# Patient Record
Sex: Male | Born: 1955
Health system: Southern US, Community
[De-identification: ages and names within clinical notes are randomized; demographics above are authoritative.]

## PROBLEM LIST (undated history)

## (undated) DIAGNOSIS — J189 Pneumonia, unspecified organism: Secondary | ICD-10-CM

## (undated) DIAGNOSIS — I219 Acute myocardial infarction, unspecified: Secondary | ICD-10-CM

## (undated) DIAGNOSIS — I099 Rheumatic heart disease, unspecified: Secondary | ICD-10-CM

## (undated) DIAGNOSIS — I080 Rheumatic disorders of both mitral and aortic valves: Secondary | ICD-10-CM

## (undated) DIAGNOSIS — I499 Cardiac arrhythmia, unspecified: Secondary | ICD-10-CM

## (undated) DIAGNOSIS — Z8719 Personal history of other diseases of the digestive system: Secondary | ICD-10-CM

## (undated) DIAGNOSIS — I639 Cerebral infarction, unspecified: Secondary | ICD-10-CM

## (undated) DIAGNOSIS — J449 Chronic obstructive pulmonary disease, unspecified: Secondary | ICD-10-CM

## (undated) DIAGNOSIS — I209 Angina pectoris, unspecified: Secondary | ICD-10-CM

## (undated) DIAGNOSIS — Z952 Presence of prosthetic heart valve: Secondary | ICD-10-CM

## (undated) DIAGNOSIS — I10 Essential (primary) hypertension: Secondary | ICD-10-CM

## (undated) DIAGNOSIS — C801 Malignant (primary) neoplasm, unspecified: Secondary | ICD-10-CM

## (undated) DIAGNOSIS — I739 Peripheral vascular disease, unspecified: Secondary | ICD-10-CM

## (undated) DIAGNOSIS — R0600 Dyspnea, unspecified: Secondary | ICD-10-CM

## (undated) DIAGNOSIS — I2510 Atherosclerotic heart disease of native coronary artery without angina pectoris: Secondary | ICD-10-CM

## (undated) HISTORY — DX: Presence of prosthetic heart valve: Z95.2

## (undated) HISTORY — DX: Peripheral vascular disease, unspecified: I73.9

## (undated) HISTORY — PX: CARDIAC VALVE REPLACEMENT: SHX585

## (undated) HISTORY — PX: CARDIAC SURGERY: SHX584

## (undated) HISTORY — PX: APPENDECTOMY: SHX54

## (undated) HISTORY — DX: Chronic obstructive pulmonary disease, unspecified: J44.9

---

## 2011-04-02 HISTORY — PX: MAZE: SHX5063

## 2011-04-02 HISTORY — PX: AORTIC AND MITRAL VALVE REPLACEMENT: SHX5056

## 2017-02-10 DIAGNOSIS — Z5181 Encounter for therapeutic drug level monitoring: Secondary | ICD-10-CM | POA: Insufficient documentation

## 2017-02-10 DIAGNOSIS — Z7901 Long term (current) use of anticoagulants: Secondary | ICD-10-CM | POA: Insufficient documentation

## 2017-10-30 HISTORY — PX: CORONARY ANGIOPLASTY WITH STENT PLACEMENT: SHX49

## 2017-12-01 ENCOUNTER — Telehealth (HOSPITAL_COMMUNITY): Payer: Self-pay | Admitting: *Deleted

## 2017-12-01 NOTE — Telephone Encounter (Signed)
Cardiac Rehab referral received from Dr. Edd Arbour at [place] had STEMI and DES RCA at [facility] on 11/21/17.  Received discharge summary notes, review completed. Additional information is needed prior to scheduling unable to access records from [facility].  Will have support staff send MD order and request for medical records.  Noted on discharge summary pt to see Dr. Daiva Huge on 11/12.  Pt will be advised to contact cardiologist office for earlier appt. Pt will need to complete follow up prior to scheduling. Cherre Huger, BSN Cardiac and Training and development officer

## 2017-12-02 ENCOUNTER — Telehealth (HOSPITAL_COMMUNITY): Payer: Self-pay

## 2017-12-02 NOTE — Telephone Encounter (Signed)
Called and spoke with patient in regards to Cardiac Rehab - patient stated he is not interested in the program. He is currently walking on his own and feels good. Closed referral.

## 2017-12-02 NOTE — Telephone Encounter (Signed)
Paper/Outside referral received from [facility] over Physician Order for Dr.Tan to fill, sign, and complete. Requested recent 12Lead/Ekg and also recent office notes if any. Patient has scheduled appt with Dr.Ghandi on 11/12. Will need sooner appt. Will contact patient in regards to insurance and seeing if he can schedule an appt sooner than 11/12.

## 2018-02-03 ENCOUNTER — Emergency Department (HOSPITAL_COMMUNITY)
Admission: EM | Admit: 2018-02-03 | Discharge: 2018-02-03 | Disposition: A | Discharge status: Home / Self Care | Payer: 59 | Attending: Emergency Medicine | Admitting: Emergency Medicine

## 2018-02-03 ENCOUNTER — Encounter (HOSPITAL_COMMUNITY): Payer: Self-pay | Admitting: Emergency Medicine

## 2018-02-03 DIAGNOSIS — I2510 Atherosclerotic heart disease of native coronary artery without angina pectoris: Secondary | ICD-10-CM | POA: Insufficient documentation

## 2018-02-03 DIAGNOSIS — Z79899 Other long term (current) drug therapy: Secondary | ICD-10-CM | POA: Diagnosis not present

## 2018-02-03 DIAGNOSIS — M25552 Pain in left hip: Secondary | ICD-10-CM | POA: Diagnosis not present

## 2018-02-03 DIAGNOSIS — Z951 Presence of aortocoronary bypass graft: Secondary | ICD-10-CM | POA: Diagnosis not present

## 2018-02-03 DIAGNOSIS — Z7901 Long term (current) use of anticoagulants: Secondary | ICD-10-CM | POA: Diagnosis not present

## 2018-02-03 DIAGNOSIS — Z7902 Long term (current) use of antithrombotics/antiplatelets: Secondary | ICD-10-CM | POA: Diagnosis not present

## 2018-02-03 DIAGNOSIS — R1032 Left lower quadrant pain: Secondary | ICD-10-CM | POA: Diagnosis present

## 2018-02-03 HISTORY — DX: Atherosclerotic heart disease of native coronary artery without angina pectoris: I25.10

## 2018-02-03 LAB — COMPREHENSIVE METABOLIC PANEL
ALK PHOS: 91 U/L (ref 38–126)
ALT: 28 U/L (ref 0–44)
AST: 26 U/L (ref 15–41)
Albumin: 4 g/dL (ref 3.5–5.0)
Anion gap: 9 (ref 5–15)
BUN: 9 mg/dL (ref 8–23)
CALCIUM: 9.4 mg/dL (ref 8.9–10.3)
CO2: 26 mmol/L (ref 22–32)
Chloride: 104 mmol/L (ref 98–111)
Creatinine, Ser: 0.92 mg/dL (ref 0.61–1.24)
GFR calc non Af Amer: >60 mL/min (ref >60)
Glucose, Bld: 118 mg/dL — ABNORMAL HIGH (ref 70–99)
POTASSIUM: 3.8 mmol/L (ref 3.5–5.1)
SODIUM: 139 mmol/L (ref 135–145)
Total Bilirubin: 1 mg/dL (ref 0.3–1.2)
Total Protein: 6.8 g/dL (ref 6.5–8.1)

## 2018-02-03 LAB — URINALYSIS, ROUTINE W REFLEX MICROSCOPIC
Bilirubin Urine: NEGATIVE
GLUCOSE, UA: NEGATIVE mg/dL
KETONES UR: 5 mg/dL — AB
Leukocytes, UA: NEGATIVE
Nitrite: NEGATIVE
PH: 5 (ref 5.0–8.0)
Protein, ur: NEGATIVE mg/dL
SPECIFIC GRAVITY, URINE: 1.026 (ref 1.005–1.030)

## 2018-02-03 LAB — CBC
HCT: 48.4 % (ref 39.0–52.0)
Hemoglobin: 15.1 g/dL (ref 13.0–17.0)
MCH: 30 pg (ref 26.0–34.0)
MCHC: 31.2 g/dL (ref 30.0–36.0)
MCV: 96.2 fL (ref 80.0–100.0)
NRBC: 0 % (ref 0.0–0.2)
PLATELETS: 183 10*3/uL (ref 150–400)
RBC: 5.03 MIL/uL (ref 4.22–5.81)
RDW: 13.1 % (ref 11.5–15.5)
WBC: 6.9 10*3/uL (ref 4.0–10.5)

## 2018-02-03 LAB — LIPASE, BLOOD: Lipase: 39 U/L (ref 11–51)

## 2018-02-03 MED ORDER — NAPROXEN 375 MG PO TABS: 375.0000 mg | ORAL_TABLET | Freq: Two times a day (BID) | ORAL | 0 refills | Status: DC

## 2018-02-03 NOTE — ED Triage Notes (Signed)
Pt arrives to ed with c/o of left side pain into abd and back. Denies any urinary symptoms, no n/v/d.

## 2018-02-03 NOTE — ED Notes (Signed)
ED Provider at bedside. 

## 2018-02-03 NOTE — ED Notes (Signed)
Patient verbalizes understanding of discharge instructions. Opportunity for questioning and answers were provided. Armband removed by staff, pt discharged from ED. Pt wheeled to lobby with family.  

## 2018-02-03 NOTE — ED Provider Notes (Signed)
[facility] Provider Note   CSN: [id number] Arrival date & time: 02/03/18  1129     History   Chief Complaint Chief Complaint  Patient presents with  . Flank Pain    HPI Jawon Dipiero is a 62 y.o. male with a hx of CAD with prior CABG 12 years prior and subsequent stent placement 2 months prior who presents to the ED with complaints of L groin pain for the past 3 days. Patient points to the area of the anterior hip/L groin region. He states it does not radiate into the testicle, upper abdomen, or down the leg. Patient states pain is specific to AROM to the L hip, ambulation, coughing, and certain positions. He states that it is alleviated and fairly much resolves in certain positions. No intervention tried PTA. He cannot recall a specific injury or change in activity. Recent PCI with catheter placement in UE not in the groin. Denies fever, chills, nausea, vomiting, diarrhea, blood in stool, numbness, weakness, dysuria, hematuria, frequency, testicular pain/swelling, or penile discharge.   HPI  Past Medical History:  Diagnosis Date  . Coronary artery disease     There are no active problems to display for this patient.   Past Surgical History:  Procedure Laterality Date  . CARDIAC SURGERY          Home Medications    Prior to Admission medications   Medication Sig Start Date End Date Taking? Authorizing Provider  atorvastatin (LIPITOR) 80 MG tablet Take 80 mg by mouth daily.   Yes [provider]  carvedilol (COREG) 3.125 MG tablet Take 3.125 mg by mouth 2 (two) times daily with a meal.   Yes [provider]  clopidogrel (PLAVIX) 75 MG tablet Take 75 mg by mouth daily.   Yes [provider]  enalapril (VASOTEC) 5 MG tablet Take 5 mg by mouth daily.   Yes [provider]  warfarin (COUMADIN) 5 MG tablet Take 5 mg by mouth daily. sunday   Yes [provider]  warfarin (COUMADIN) 7.5 MG  tablet Take 7.5 mg by mouth daily.   Yes [provider]    Family History History reviewed. No pertinent family history.  Social History Social History   Tobacco Use  . Smoking status: Not on file  Substance Use Topics  . Alcohol use: Not on file  . Drug use: Not on file     Allergies   Patient has no known allergies.   Review of Systems Review of Systems  Constitutional: Negative for chills and fever.  Respiratory: Negative for shortness of breath.   Cardiovascular: Negative for chest pain.  Gastrointestinal: Negative for nausea and vomiting.  Genitourinary: Negative for discharge, dysuria, frequency, hematuria, penile pain, penile swelling, scrotal swelling, testicular pain and urgency.       Positive for L groin pain/hip pain.   Neurological: Negative for weakness and numbness.  All other systems reviewed and are negative.    Physical Exam Updated Vital Signs BP 138/67   Pulse 75   Temp 97.9 F (36.6 C) (Oral)   Resp 18   SpO2 98%   Physical Exam  Constitutional: He appears well-developed and well-nourished.  Non-toxic appearance. No distress.  HENT:  Head: Normocephalic and atraumatic.  Eyes: Conjunctivae are normal. Right eye exhibits no discharge. Left eye exhibits no discharge.  Neck: Neck supple.  Cardiovascular: Normal rate and regular rhythm.  Pulses:      Dorsalis pedis pulses are 2+ on  the right side, and 2+ on the left side.       Posterior tibial pulses are 2+ on the right side, and 2+ on the left side.  Pulmonary/Chest: Effort normal and breath sounds normal. No respiratory distress. He has no wheezes. He has no rhonchi. He has no rales.  Respiration even and unlabored  Abdominal: Soft. He exhibits no distension. There is no tenderness. There is no rigidity, no rebound, no guarding, no CVA tenderness and no tenderness at McBurney's point. No hernia. Hernia confirmed negative in the right inguinal area and confirmed negative in the left  inguinal area.  Genitourinary: Testes normal and penis normal. Cremasteric reflex is present. Right testis shows no mass and no tenderness. Left testis shows no mass and no tenderness.  Genitourinary Comments: EDT Allena Katz present as chaperone.   Musculoskeletal:  No obvious deformity, appreciable swelling, erythema, ecchymosis, warmth, or open wounds. Patient has venous stasis changes to the LEs bilaterally. Patient has AROM intact to bilateral knees, ankles, and all digits. He has full painless AROM to the R hip. AROM intact to the L hip however patient has pain with active flexion, abduction, and extension of the L hip. No pain with passive ROM to the L hip joint. He has some tenderness to the lateral hip without point/focal bony tenderness. NVI distally.   Neurological: He is alert.  Clear speech.  Sensation grossly intact bilateral lower extremities.  5 out of 5 strength plantar dorsiflexion bilaterally.  Ambulatory with antalgic gait.  Skin: Skin is warm and dry. No rash noted.  Psychiatric: He has a normal mood and affect. His behavior is normal.  Nursing note and vitals reviewed.    ED Treatments / Results  Labs (all labs ordered are listed, but only abnormal results are displayed) Labs Reviewed  COMPREHENSIVE METABOLIC PANEL - Abnormal; Notable for the following components:      Result Value   Glucose, Bld 118 (*)    All other components within normal limits  URINALYSIS, ROUTINE W REFLEX MICROSCOPIC - Abnormal; Notable for the following components:   Hgb urine dipstick SMALL (*)    Ketones, ur 5 (*)    Bacteria, UA RARE (*)    All other components within normal limits  LIPASE, BLOOD  CBC    EKG None  Radiology No results found.  Procedures Procedures (including critical care time)  Medications Ordered in ED Medications - No data to display   Initial Impression / Assessment and Plan / ED Course  I have reviewed the triage vital signs and the nursing  notes.  Pertinent labs & imaging results that were available during my care of the patient were reviewed by me and considered in my medical decision making (see chart for details).   Patient presents to the ED with L groin pain. Patient nontoxic appearing, in no apparent distress, vitals WNL with the exception of initially elevated BP- doubt HTN emergency. Lab work per triage prior to my assessment reviewed fairly unremarkable. No leukocytosis, anemia, or significant electrolyte disturbance. LFTs, lipase, and renal function WNL. Urinalysis with small amount of hematuria.    On exam patient's abdomen is soft and nontender without peritoneal signs- doubt diverticulitis or bowel obstruction/perforation. No palpable hernia to the inguinal or femoral areas on exam. Given distribution of sxs feel that nephrolithiasis is less likely, hematuria will require PCP recheck. No evidence of UTI/pyelo. No fevers or overlying erythema/warmth of the joint, passive ROM without pain, do not suspect septic joint. Suspect musculoskeletal  in nature given discomfort with AROM and with lateral hip palpation. No injuries to raise concern for fracture/dislocation. NVI distally.  Findings and plan of care discussed with supervising physician Dr. Kathrynn Humble who personally evaluated and examined patient-recommends short term prescription for naproxen 375 mg twice daily with PRICE and PCP follow up, I am in agreement. Crutches were offered- patient declined. I discussed results, treatment plan, need for PCP follow-up, and return precautions with the patient and his wife at bedside. Provided opportunity for questions, patient and his wife confirmed understanding and are in agreement with plan.     Final Clinical Impressions(s) / ED Diagnoses   Final diagnoses:  Pain of left hip joint    ED Discharge Orders         Ordered    naproxen (NAPROSYN) 375 MG tablet  2 times daily     02/03/18 1948           Petrucelli, Glynda Jaeger,  PA-C 02/03/18 2036    Varney Biles, MD 02/04/18 1905

## 2018-02-03 NOTE — Discharge Instructions (Addendum)
You were seen in the emergency department today for left groin pain.  Your overall work-up in the ER was reassuring.  Your labs were normal with the exception of some blood in your urine which needs to be rechecked by her primary care provider.  Your blood sugar was also mildly elevated which can also be rechecked by her primary care provider.  We suspect your pain is musculoskeletal in nature and are sending you home with a prescription for naproxen with instructions to apply ice wrapped in a towel 20 minutes on 40 minutes off for the next 48 hours.  - Naproxen is a nonsteroidal anti-inflammatory medication that will help with pain and swelling. Be sure to take this medication as prescribed with food, 1 pill every 12 hours,  It should be taken with food, as it can cause stomach upset, and more seriously, stomach bleeding. Do not take other nonsteroidal anti-inflammatory medications with this such as Advil, Motrin, Aleve, Mobic, Goodie Powder, or Motrin.  Please be aware that this medicine does somewhat increase your risk for bleeding given that you are on warfarin-should you experience any increased bruising blood in your stool, coughing up blood, or any concerning symptoms return to the ER immediately  You make take Tylenol per over the counter dosing with these medications.   We have prescribed you new medication(s) today. Discuss the medications prescribed today with your pharmacist as they can have adverse effects and interactions with your other medicines including over the counter and prescribed medications. Seek medical evaluation if you start to experience new or abnormal symptoms after taking one of these medicines, seek care immediately if you start to experience difficulty breathing, feeling of your throat closing, facial swelling, or rash as these could be indications of a more serious allergic reaction  Please follow-up with your primary care provider within the next 3 days for reevaluation.   Return to the ER for new or worsening symptoms or any other concerns.

## 2018-02-17 DIAGNOSIS — I2510 Atherosclerotic heart disease of native coronary artery without angina pectoris: Secondary | ICD-10-CM | POA: Insufficient documentation

## 2018-02-19 ENCOUNTER — Ambulatory Visit (INDEPENDENT_AMBULATORY_CARE_PROVIDER_SITE_OTHER): Payer: 59 | Admitting: Family Medicine

## 2018-02-19 ENCOUNTER — Encounter: Payer: Self-pay | Admitting: Family Medicine

## 2018-02-19 VITALS — BP 146/98 | HR 75 | Temp 97.8°F | Ht 68.0 in | Wt 206.1 lb

## 2018-02-19 DIAGNOSIS — I10 Essential (primary) hypertension: Secondary | ICD-10-CM

## 2018-02-19 DIAGNOSIS — M5440 Lumbago with sciatica, unspecified side: Secondary | ICD-10-CM

## 2018-02-19 DIAGNOSIS — M25552 Pain in left hip: Secondary | ICD-10-CM | POA: Diagnosis not present

## 2018-02-19 DIAGNOSIS — I739 Peripheral vascular disease, unspecified: Secondary | ICD-10-CM

## 2018-02-19 DIAGNOSIS — I872 Venous insufficiency (chronic) (peripheral): Secondary | ICD-10-CM

## 2018-02-19 DIAGNOSIS — R310 Gross hematuria: Secondary | ICD-10-CM

## 2018-02-19 DIAGNOSIS — I2510 Atherosclerotic heart disease of native coronary artery without angina pectoris: Secondary | ICD-10-CM

## 2018-02-19 DIAGNOSIS — Z952 Presence of prosthetic heart valve: Secondary | ICD-10-CM

## 2018-02-19 DIAGNOSIS — R1032 Left lower quadrant pain: Secondary | ICD-10-CM | POA: Diagnosis not present

## 2018-02-19 DIAGNOSIS — I480 Paroxysmal atrial fibrillation: Secondary | ICD-10-CM

## 2018-02-19 LAB — MICROSCOPIC MESSAGE

## 2018-02-19 LAB — URINALYSIS, ROUTINE W REFLEX MICROSCOPIC
BACTERIA UA: NONE SEEN /HPF
Bilirubin Urine: NEGATIVE
GLUCOSE, UA: NEGATIVE
Ketones, ur: NEGATIVE
LEUKOCYTES UA: NEGATIVE
Nitrite: NEGATIVE
PH: 5 (ref 5.0–8.0)
Protein, ur: NEGATIVE
SPECIFIC GRAVITY, URINE: 1.025 (ref 1.001–1.03)
Squamous Epithelial / LPF: NONE SEEN /HPF (ref <=5)
WBC, UA: NONE SEEN /HPF (ref 0–5)

## 2018-02-19 NOTE — Progress Notes (Signed)
Patient ID: [name], male    DOB: 1955-08-30, 62 y.o.   MRN: [id number]  PCP: Delsa Grana, PA-C  Chief Complaint  Patient presents with  . Establish Care    Patient has c/o of pain in bilateral legs    Subjective:   [name] is a 62 y.o. male, presents to clinic with CC of pain to b/l legs and is new to establish care here.  He recently went to the ER for same complaint (ER follow up).  Presents today for CC of left hip pain "felt like bone was grinding in hip" he was dx with a muscle strain to left hip at his ER visit, he states pain has continued for 3 weeks, fairly constant, worse with sitting and movement, no alleviating factors.  He reports that when the pain first started he thought it could possibly be a kidney stone and he saw blood in urine -when it first started the pain doubled him over.  Some of the pain was located to his left hip, left lower quadrant and left low back and flank.  The severity of the pain is what caused him to go to the ER.  ER records were reviewed, there was no imaging done to review.  There was no concern for myalgias secondary to statin use.  Basic labs were obtained, CBC, CMP and lipase -was unremarkable.  Urine showed trace blood, ketones and trace bacteria.  For the past 3 weeks the pain is been located in the hip, he denies any flank pain, abdominal pain, nausea, vomiting, dysuria, Rosanne Gutting frequency, urinary urgency  One week ago he had sudden intermittent right anterior central thigh pain start, described as sharp and stabbing.  No aggravating or alleviating factors.  He has not been able to identify anything that causes the pain or relieves the pain.  The pain is non-radiating.  He denies any injury, denies any skin changes in that area, he has had no pallor, numbness, tingling or weakness.  There is no muscle spasm or tension or firmness to that area.  Hx of b/l LE edema and chronic skin changes, had PAD eval before told no decreased blood flow,  he has not seen vascular in the past, has hyperpigmentation, thickened skin changes, and chronic edema b/l from feet to knees.   Stents placed at [facility], 2 stents, after MI - established with cardiology Dr. Daiva Huge On Ace, coreg, lipitor   Warfarin - goal 2.5-3.5, home monitoring  There are no active problems to display for this patient.    Prior to Admission medications   Medication Sig Start Date End Date Taking? Authorizing Provider  atorvastatin (LIPITOR) 80 MG tablet Take 80 mg by mouth daily.    [provider]  carvedilol (COREG) 3.125 MG tablet Take 3.125 mg by mouth 2 (two) times daily with a meal.    [provider]  clopidogrel (PLAVIX) 75 MG tablet Take 75 mg by mouth daily.    [provider]  enalapril (VASOTEC) 5 MG tablet Take 5 mg by mouth daily.    [provider]  warfarin (COUMADIN) 5 MG tablet Take 5 mg by mouth daily. sunday    [provider]  warfarin (COUMADIN) 7.5 MG tablet Take 7.5 mg by mouth daily.    [provider]     No Known Allergies   Family History  Problem Relation Age of Onset  . Bladder Cancer Mother   . Lung cancer Mother   .  Brain cancer Mother   . Heart disease Father   . Heart disease Sister   . Diabetes Maternal Grandfather      Social History   Socioeconomic History  . Marital status: Single    Spouse name: Not on file  . Number of children: Not on file  . Years of education: Not on file  . Highest education level: Not on file  Occupational History  . Not on file  Social Needs  . Financial resource strain: Not on file  . Food insecurity:    Worry: Not on file    Inability: Not on file  . Transportation needs:    Medical: Not on file    Non-medical: Not on file  Tobacco Use  . Smoking status: Current Every Day Smoker    Types: Cigarettes  . Smokeless tobacco: Never Used  Substance and Sexual Activity  . Alcohol use: Yes  . Drug use: Never  . Sexual activity:  Not on file  Lifestyle  . Physical activity:    Days per week: Not on file    Minutes per session: Not on file  . Stress: Not on file  Relationships  . Social connections:    Talks on phone: Not on file    Gets together: Not on file    Attends religious service: Not on file    Active member of club or organization: Not on file    Attends meetings of clubs or organizations: Not on file    Relationship status: Not on file  . Intimate partner violence:    Fear of current or ex partner: Not on file    Emotionally abused: Not on file    Physically abused: Not on file    Forced sexual activity: Not on file  Other Topics Concern  . Not on file  Social History Narrative  . Not on file     Review of Systems  Constitutional: Negative.   HENT: Negative.   Eyes: Negative.   Respiratory: Negative.   Cardiovascular: Negative.   Gastrointestinal: Negative.   Endocrine: Negative.   Genitourinary: Negative.   Musculoskeletal: Negative.   Skin: Negative.   Allergic/Immunologic: Negative.   Neurological: Negative.   Hematological: Negative.   Psychiatric/Behavioral: Negative.   All other systems reviewed and are negative.      Objective:    Vitals:   02/19/18 0926  BP: (!) 146/98  Pulse: 75  Temp: 97.8 F (36.6 C)  TempSrc: Oral  SpO2: 100%  Weight: 206 lb 2 oz (93.5 kg)  Height: 5\' 8"  (1.727 m)      Physical Exam  Constitutional: He is oriented to person, place, and time. He appears well-developed and well-nourished.  Non-toxic appearance. He does not appear ill. No distress.  HENT:  Head: Normocephalic and atraumatic.  Right Ear: Tympanic membrane, external ear and ear canal normal.  Left Ear: Tympanic membrane, external ear and ear canal normal.  Nose: No mucosal edema or rhinorrhea. Right sinus exhibits no maxillary sinus tenderness and no frontal sinus tenderness. Left sinus exhibits no maxillary sinus tenderness and no frontal sinus tenderness.  Mouth/Throat:  Uvula is midline. No trismus in the jaw. No uvula swelling. No oropharyngeal exudate, posterior oropharyngeal edema or posterior oropharyngeal erythema.  Eyes: Pupils are equal, round, and reactive to light. Conjunctivae, EOM and lids are normal.  Neck: Trachea normal, normal range of motion and phonation normal. Neck supple. No tracheal deviation present.  Cardiovascular: Regular rhythm and normal pulses. Exam reveals no  friction rub.  Pulses:      Radial pulses are 2+ on the right side, and 2+ on the left side.  Mechanical valves auscultated on exam  Pulmonary/Chest: Effort normal and breath sounds normal. He has no decreased breath sounds. He has no wheezes. He has no rhonchi. He has no rales.  Abdominal: Soft. Normal appearance and bowel sounds are normal. He exhibits no distension. There is no tenderness. There is no rigidity, no rebound, no guarding, no CVA tenderness, no tenderness at McBurney's point and negative Murphy's sign.  Musculoskeletal: Normal range of motion. He exhibits no edema.       Left hip: He exhibits normal range of motion, normal strength, no tenderness, no bony tenderness, no swelling and no crepitus.       Cervical back: Normal.       Thoracic back: Normal.       Lumbar back: He exhibits no tenderness, no bony tenderness, no swelling, no edema and no spasm.  Some difficulty with positional changes from seated to standing but overall normal range of motion +SLR left  Neurological: He is alert and oriented to person, place, and time. Gait normal.  Skin: Skin is warm, dry and intact. Capillary refill takes less than 2 seconds.  Chronic skin changes to bilateral lower extremities up to knees, has hyperpigmentation, thickening of skin To bilateral feet and ankles visible dark purple reticular veins Ulceration or drainage Lateral lower extremity is edematous   Psychiatric: He has a normal mood and affect. His speech is normal and behavior is normal.            Assessment & Plan:      ICD-10-CM   1. Gross hematuria R31.0 CT RENAL STONE STUDY    Urinalysis, Routine w reflex microscopic  2. Left lower quadrant abdominal pain R10.32 CT RENAL STONE STUDY  3. Left hip pain M25.552 DG HIP UNILAT WITH PELVIS 2-3 VIEWS LEFT  4. Acute left-sided low back pain with sciatica, sciatica laterality unspecified M54.40 DG Lumbar Spine Complete     62 year old male here to establish care, has a few acute complaints, and some concerning chronic medical problems - sx are interesting, started several weeks ago with what he thought was a kidney stone with sudden pain that dropped him to his knees he had a gross hematuria, he went to the ER later, at that point it was more located to his left hip joint and they diagnosed him with muscle strain.  Pain has been unchanged for 3 weeks.  He is still concerned about possible kidney stone.  In the last week he is also had right central quadricep pain that is severe sharp and comes and goes without any prior to getting factors or any exacerbating or alleviating factors.  There is no swelling, injury or right low back pain associated with this  Have some left low back pain somewhat associated with his left hip and left lower quadrant abdominal pain he does have some sciatica features and worsening of pain with movement and with going from sitting to standing.  Although there is no reproduction of pain on exam for his low back or his hip for his thigh pain.  He is also anticoagulated for 2 mechanical valves and recently had 2 stents placed after having a heart attack and has not had a PCP in about 2 years, prior to that moved from [place]  Monitors his anticoagulation with home monitoring, his goal is between 2.5 and 3.5.  Is also on Plavix, ACE inhibitor and Coreg, has seen a cardiologist locally.  When attempted to do vascular exam for his bilateral lower extremity and hip pain which is different history of symptoms on each  side he does have severe lower extremity venous stasis and dermatitis.  Cannot palpate pulses to his femoral, popliteal or pedal or posterior tibialis pulses but he does not have any cyanosis, he is not cool to the touch and he has no severe pain throughout the whole joint and does not have any numbness.  With his very bizarre presentation I do feel that he needs a fairly thorough vascular exam of both his peripheral artery blood flow and vascular blood flow.  Definitely has many signs of severe peripheral vascular disease, but this has been chronic and gradually progressing and it does not bother him very much.  Pain in his right thigh, there is no edema, there is no tension in the muscle compartments all muscle compartments are soft, nontender with palpation to the quadricep muscles.  Would like to rule out any type of kidney stone causing some of his pain that has not resolved, will rescreen his urine, CT stone study ordered  Will evaluate left hip and lumbar spine for degenerative changes and arthritis do believe he has some sciatica features which I would like to treat there was in physical therapy but patient would like to get imaging first and he will not do it today he would like to do it tomorrow.  Also prednisone would interact with his Coumadin so there is that to consider  Is new to establish care with multiple problems we will have to obtain records from [place] and review, will have to review local records from his recent heart catheterization and heart attack and cardiology visits.  Delsa Grana, PA-C 02/19/18 4:24 PM  Pt did not want to start any steroid tx for back pain.  UA negative, images reviewed. Pending CT renal stone study   02/24/18 4:28 PM Per care everywhere from Cardiology appt at [facility] on 02/10/18:  Assessment  Burk Hoctor is a 62 y.o. White or Caucasian [1] male with a history of his of CAD s/p DES to RCA 10/2017, severe AS and MV insufficiency  due to suspected rheumatic heart disease s/p mechanical AV and mechanical MV replacement, atrial appendage ligation and maze procedure in 2013, and chronic a fib presents to clinic for follow up Plan   CAD s/p Inferior STEMI 10/2017, chest pain free - continue plavix, statin, BB and ACEi - will stop plavix and start aspirin 10/2018 (1 year from STEMI)  Hx of rheumatic severe AS and MV regurgitation  - s/p mechanical AV and mechanical MV replacement 2013 - TTE 10/2017 showed both mechanical valves well-seated with a LVEF of 40-45% - TTE next year - continue coumadin, INR goal 2.5-3.5 - followed by coumadin clinic   Paroxysmal atrial fibrillation  - rate control with atenolol  - coumadin as above   HTN, well controlled  - continue bb and acei    Electronically signed by: Margaretmary Dys, MD Resident 02/17/18 1008   Problem list updated with help of shared record Still pending past PCP records from [place].

## 2018-02-20 ENCOUNTER — Ambulatory Visit
Admission: RE | Admit: 2018-02-20 | Discharge: 2018-02-20 | Disposition: A | Discharge status: Home / Self Care | Payer: 59 | Source: Ambulatory Visit | Attending: Family Medicine | Admitting: Family Medicine

## 2018-02-24 ENCOUNTER — Encounter: Payer: Self-pay | Admitting: Family Medicine

## 2018-02-24 DIAGNOSIS — I480 Paroxysmal atrial fibrillation: Secondary | ICD-10-CM | POA: Insufficient documentation

## 2018-02-24 DIAGNOSIS — I739 Peripheral vascular disease, unspecified: Secondary | ICD-10-CM | POA: Insufficient documentation

## 2018-02-24 DIAGNOSIS — I10 Essential (primary) hypertension: Secondary | ICD-10-CM | POA: Insufficient documentation

## 2018-02-24 DIAGNOSIS — Z952 Presence of prosthetic heart valve: Secondary | ICD-10-CM | POA: Insufficient documentation

## 2018-02-24 NOTE — Progress Notes (Signed)
Yes I was referring to vascular.  He refused steroids and PT before, did he want to do them now?

## 2018-02-25 NOTE — Addendum Note (Signed)
Addended by: Delsa Grana on: 02/25/2018 01:42 PM   Modules accepted: Orders

## 2018-02-25 NOTE — Progress Notes (Signed)
I put it in, thank you

## 2018-03-02 ENCOUNTER — Ambulatory Visit (HOSPITAL_COMMUNITY): Payer: 59

## 2018-03-09 ENCOUNTER — Ambulatory Visit (HOSPITAL_COMMUNITY): Admission: RE | Admit: 2018-03-09 | Payer: 59 | Source: Ambulatory Visit

## 2018-03-16 ENCOUNTER — Other Ambulatory Visit: Payer: Self-pay

## 2018-03-16 DIAGNOSIS — I739 Peripheral vascular disease, unspecified: Secondary | ICD-10-CM

## 2018-04-24 ENCOUNTER — Other Ambulatory Visit: Payer: Self-pay

## 2018-04-24 ENCOUNTER — Ambulatory Visit (HOSPITAL_COMMUNITY)
Admission: RE | Admit: 2018-04-24 | Discharge: 2018-04-24 | Disposition: A | Discharge status: Home / Self Care | Payer: 59 | Source: Ambulatory Visit | Attending: Vascular Surgery | Admitting: Vascular Surgery

## 2018-04-24 ENCOUNTER — Ambulatory Visit: Payer: 59 | Admitting: Vascular Surgery

## 2018-04-24 ENCOUNTER — Encounter: Payer: Self-pay | Admitting: Vascular Surgery

## 2018-04-24 VITALS — BP 135/76 | HR 75 | Resp 18 | Ht 68.0 in | Wt 203.4 lb

## 2018-04-24 DIAGNOSIS — I872 Venous insufficiency (chronic) (peripheral): Secondary | ICD-10-CM

## 2018-04-24 DIAGNOSIS — I739 Peripheral vascular disease, unspecified: Secondary | ICD-10-CM

## 2018-04-24 NOTE — Progress Notes (Signed)
Patient ID: [name], male   DOB: [birth year], 63 y.o.   MRN: [id number]  Reason for Consult: New Patient (Initial Visit) (eval PVD)   Referred by Delsa Grana, PA-C  Subjective:     HPI:  [name] is a 63 y.o. male presents for evaluation of right thigh pain.  He states that this is present all the time not exacerbated with walking or activity.  Previously was having left hip pain but that has resolved.  He is a chronic every day smoker since the age of 10.  He also has skin changes to his lower extremities is been stable for multiple years and has never had tissue loss or ulceration.  States that he can walk about 1000 steps prior to having calf cramping right greater than left causes him to stop.  He has never had stroke TIA or amaurosis.  He does have previous to valve replacement takes Coumadin daily.  Past Medical History:  Diagnosis Date  . COPD (chronic obstructive pulmonary disease) ([place])   . Coronary artery disease   . Hx of artificial heart valve replacement    Family History  Problem Relation Age of Onset  . Bladder Cancer Mother   . Lung cancer Mother   . Brain cancer Mother   . Heart disease Father   . Heart disease Sister   . Diabetes Maternal Grandfather    Past Surgical History:  Procedure Laterality Date  . CARDIAC SURGERY      Short Social History:  Social History   Tobacco Use  . Smoking status: Current Every Day Smoker    Types: Cigarettes  . Smokeless tobacco: Never Used  Substance Use Topics  . Alcohol use: Yes    No Known Allergies  Current Outpatient Medications  Medication Sig Dispense Refill  . atorvastatin (LIPITOR) 80 MG tablet Take 80 mg by mouth daily.    . carvedilol (COREG) 3.125 MG tablet Take 3.125 mg by mouth 2 (two) times daily with a meal.    . clopidogrel (PLAVIX) 75 MG tablet Take 75 mg by mouth daily.    . enalapril (VASOTEC) 5 MG tablet Take 5 mg by mouth daily.    Marland Kitchen warfarin (COUMADIN) 5 MG tablet Take 5 mg by  mouth daily. sunday    . warfarin (COUMADIN) 7.5 MG tablet Take 7.5 mg by mouth daily.     No current facility-administered medications for this visit.     Review of Systems  Constitutional:  Constitutional negative. HENT: HENT negative.  Eyes: Eyes negative.  Respiratory: Positive for wheezing.  Cardiovascular: Positive for claudication and leg swelling.  GI: Gastrointestinal negative.  Musculoskeletal: Positive for leg pain.  Skin:       Leg discoloration Neurological: Neurological negative. Hematologic: Hematologic/lymphatic negative.  Psychiatric: Psychiatric negative.        Objective:  Objective   Vitals:   04/24/18 1031  BP: 135/76  Pulse: 75  Resp: 18  SpO2: 99%  Weight: 203 lb 6.4 oz (92.3 kg)  Height: 5\' 8"  (1.727 m)   Body mass index is 30.93 kg/m.  Physical Exam HENT:     Head: Normocephalic.  Eyes:     Extraocular Movements: Extraocular movements intact.     Pupils: Pupils are equal, round, and reactive to light.  Neck:     Musculoskeletal: Normal range of motion and neck supple.     Vascular: No carotid bruit.  Cardiovascular:     Rate and Rhythm: Regular rhythm.  Pulses:          Femoral pulses are 1+ on the right side and 1+ on the left side.      Popliteal pulses are 0 on the right side and 0 on the left side.       Dorsalis pedis pulses are 0 on the right side and 0 on the left side.       Posterior tibial pulses are 0 on the right side and 0 on the left side.     Comments: Monophasic dorsalis pedis posterior tibial signals on the right and biphasic on the left Pulmonary:     Effort: Pulmonary effort is normal.  Abdominal:     General: Abdomen is flat.     Palpations: Abdomen is soft.  Musculoskeletal:        General: Swelling present.     Comments: Skin changes consistent with C4 venous disease bilaterally  Neurological:     General: No focal deficit present.     Mental Status: He is alert.  Psychiatric:        Mood and Affect:  Mood normal.        Behavior: Behavior normal.        Thought Content: Thought content normal.        Judgment: Judgment normal.     Data: I have independently interpreted his ABIs to be 0.74 right with toe pressure 47 and 1.16 left triphasic with toe pressure 52     Assessment/Plan:     63 year old male presents initially with left hip pain now right thigh pain that is not associated with any activity I cannot feel any palpable abnormalities is likely musculoskeletal in nature.  He also has C4 venous disease and I have recommended compression stockings as I would not consider venous ablation given his also vascular disease with claudication right greater than left currently at 1000 steps.  I do think there is any indication for any intervention at this time.  We did discuss smoking cessation for greater than 3 minutes at this time I do not think he is interested.  We will bring him back in 1 year and check ABIs.     Waynetta Sandy MD [facility]

## 2019-03-15 ENCOUNTER — Inpatient Hospital Stay (HOSPITAL_COMMUNITY)
Admission: EM | Admit: 2019-03-15 | Discharge: 2019-03-20 | DRG: 372 | Disposition: A | Discharge status: Home / Self Care | Payer: 59 | Source: Ambulatory Visit | Attending: Internal Medicine | Admitting: Internal Medicine

## 2019-03-15 ENCOUNTER — Encounter (HOSPITAL_COMMUNITY): Payer: Self-pay | Admitting: *Deleted

## 2019-03-15 ENCOUNTER — Other Ambulatory Visit: Payer: Self-pay

## 2019-03-15 ENCOUNTER — Emergency Department (HOSPITAL_COMMUNITY): Payer: 59

## 2019-03-15 ENCOUNTER — Ambulatory Visit: Payer: 59 | Admitting: Family Medicine

## 2019-03-15 ENCOUNTER — Other Ambulatory Visit: Payer: Self-pay | Admitting: *Deleted

## 2019-03-15 ENCOUNTER — Encounter: Payer: Self-pay | Admitting: Family Medicine

## 2019-03-15 ENCOUNTER — Ambulatory Visit (HOSPITAL_COMMUNITY)
Admission: RE | Admit: 2019-03-15 | Discharge: 2019-03-15 | Disposition: A | Discharge status: Home / Self Care | Payer: 59 | Source: Ambulatory Visit | Attending: Family Medicine | Admitting: Family Medicine

## 2019-03-15 VITALS — BP 102/56 | HR 84 | Temp 97.1°F | Resp 22 | Ht 68.0 in | Wt 195.0 lb

## 2019-03-15 DIAGNOSIS — R1031 Right lower quadrant pain: Secondary | ICD-10-CM

## 2019-03-15 DIAGNOSIS — K3532 Acute appendicitis with perforation and localized peritonitis, without abscess: Secondary | ICD-10-CM

## 2019-03-15 DIAGNOSIS — K381 Appendicular concretions: Secondary | ICD-10-CM | POA: Diagnosis present

## 2019-03-15 DIAGNOSIS — Z79899 Other long term (current) drug therapy: Secondary | ICD-10-CM | POA: Diagnosis not present

## 2019-03-15 DIAGNOSIS — R1030 Lower abdominal pain, unspecified: Secondary | ICD-10-CM

## 2019-03-15 DIAGNOSIS — T45515A Adverse effect of anticoagulants, initial encounter: Secondary | ICD-10-CM | POA: Diagnosis present

## 2019-03-15 DIAGNOSIS — Z8249 Family history of ischemic heart disease and other diseases of the circulatory system: Secondary | ICD-10-CM | POA: Diagnosis not present

## 2019-03-15 DIAGNOSIS — I080 Rheumatic disorders of both mitral and aortic valves: Secondary | ICD-10-CM | POA: Diagnosis present

## 2019-03-15 DIAGNOSIS — Z952 Presence of prosthetic heart valve: Secondary | ICD-10-CM | POA: Diagnosis not present

## 2019-03-15 DIAGNOSIS — Z833 Family history of diabetes mellitus: Secondary | ICD-10-CM | POA: Diagnosis not present

## 2019-03-15 DIAGNOSIS — I252 Old myocardial infarction: Secondary | ICD-10-CM

## 2019-03-15 DIAGNOSIS — Z20828 Contact with and (suspected) exposure to other viral communicable diseases: Secondary | ICD-10-CM | POA: Diagnosis present

## 2019-03-15 DIAGNOSIS — R319 Hematuria, unspecified: Secondary | ICD-10-CM

## 2019-03-15 DIAGNOSIS — I10 Essential (primary) hypertension: Secondary | ICD-10-CM | POA: Diagnosis present

## 2019-03-15 DIAGNOSIS — F17210 Nicotine dependence, cigarettes, uncomplicated: Secondary | ICD-10-CM | POA: Diagnosis present

## 2019-03-15 DIAGNOSIS — K389 Disease of appendix, unspecified: Secondary | ICD-10-CM | POA: Diagnosis not present

## 2019-03-15 DIAGNOSIS — I480 Paroxysmal atrial fibrillation: Secondary | ICD-10-CM | POA: Diagnosis present

## 2019-03-15 DIAGNOSIS — Z7901 Long term (current) use of anticoagulants: Secondary | ICD-10-CM | POA: Diagnosis not present

## 2019-03-15 DIAGNOSIS — J441 Chronic obstructive pulmonary disease with (acute) exacerbation: Secondary | ICD-10-CM | POA: Diagnosis not present

## 2019-03-15 DIAGNOSIS — R791 Abnormal coagulation profile: Secondary | ICD-10-CM | POA: Diagnosis present

## 2019-03-15 DIAGNOSIS — I739 Peripheral vascular disease, unspecified: Secondary | ICD-10-CM | POA: Diagnosis present

## 2019-03-15 DIAGNOSIS — Z7982 Long term (current) use of aspirin: Secondary | ICD-10-CM

## 2019-03-15 DIAGNOSIS — Z955 Presence of coronary angioplasty implant and graft: Secondary | ICD-10-CM | POA: Diagnosis not present

## 2019-03-15 DIAGNOSIS — I2510 Atherosclerotic heart disease of native coronary artery without angina pectoris: Secondary | ICD-10-CM | POA: Diagnosis present

## 2019-03-15 DIAGNOSIS — E876 Hypokalemia: Secondary | ICD-10-CM | POA: Diagnosis not present

## 2019-03-15 DIAGNOSIS — K567 Ileus, unspecified: Secondary | ICD-10-CM | POA: Diagnosis not present

## 2019-03-15 DIAGNOSIS — E785 Hyperlipidemia, unspecified: Secondary | ICD-10-CM

## 2019-03-15 DIAGNOSIS — Z720 Tobacco use: Secondary | ICD-10-CM

## 2019-03-15 DIAGNOSIS — J410 Simple chronic bronchitis: Secondary | ICD-10-CM | POA: Diagnosis not present

## 2019-03-15 DIAGNOSIS — K3533 Acute appendicitis with perforation and localized peritonitis, with abscess: Principal | ICD-10-CM | POA: Insufficient documentation

## 2019-03-15 DIAGNOSIS — I482 Chronic atrial fibrillation, unspecified: Secondary | ICD-10-CM

## 2019-03-15 DIAGNOSIS — J449 Chronic obstructive pulmonary disease, unspecified: Secondary | ICD-10-CM

## 2019-03-15 HISTORY — DX: Rheumatic disorders of both mitral and aortic valves: I08.0

## 2019-03-15 HISTORY — DX: Rheumatic heart disease, unspecified: I09.9

## 2019-03-15 LAB — URINALYSIS, ROUTINE W REFLEX MICROSCOPIC
Bilirubin Urine: NEGATIVE
Glucose, UA: NEGATIVE
Leukocytes,Ua: NEGATIVE
Nitrite: POSITIVE — AB
Specific Gravity, Urine: 1.018 (ref 1.001–1.03)
Squamous Epithelial / LPF: NONE SEEN /HPF (ref <=5)
WBC, UA: NONE SEEN /HPF (ref 0–5)
pH: 5 (ref 5.0–8.0)

## 2019-03-15 LAB — COMPREHENSIVE METABOLIC PANEL
ALT: 22 U/L (ref 0–44)
AST: 32 U/L (ref 15–41)
Albumin: 4 g/dL (ref 3.5–5.0)
Alkaline Phosphatase: 75 U/L (ref 38–126)
Anion gap: 10 (ref 5–15)
BUN: 44 mg/dL — ABNORMAL HIGH (ref 8–23)
CO2: 27 mmol/L (ref 22–32)
Calcium: 8.9 mg/dL (ref 8.9–10.3)
Chloride: 100 mmol/L (ref 98–111)
Creatinine, Ser: 1.39 mg/dL — ABNORMAL HIGH (ref 0.61–1.24)
GFR calc Af Amer: >60 mL/min (ref >60)
GFR calc non Af Amer: 54 mL/min — ABNORMAL LOW (ref >60)
Glucose, Bld: 125 mg/dL — ABNORMAL HIGH (ref 70–99)
Potassium: 3.6 mmol/L (ref 3.5–5.1)
Sodium: 137 mmol/L (ref 135–145)
Total Bilirubin: 1.6 mg/dL — ABNORMAL HIGH (ref 0.3–1.2)
Total Protein: 7 g/dL (ref 6.5–8.1)

## 2019-03-15 LAB — CBC WITH DIFFERENTIAL/PLATELET
Abs Immature Granulocytes: 0.11 10*3/uL — ABNORMAL HIGH (ref 0.00–0.07)
Basophils Absolute: 0 10*3/uL (ref 0.0–0.1)
Basophils Relative: 0 %
Eosinophils Absolute: 0 10*3/uL (ref 0.0–0.5)
Eosinophils Relative: 0 %
HCT: 47 % (ref 39.0–52.0)
Hemoglobin: 15.4 g/dL (ref 13.0–17.0)
Immature Granulocytes: 1 %
Lymphocytes Relative: 4 %
Lymphs Abs: 0.6 10*3/uL — ABNORMAL LOW (ref 0.7–4.0)
MCH: 30.6 pg (ref 26.0–34.0)
MCHC: 32.8 g/dL (ref 30.0–36.0)
MCV: 93.3 fL (ref 80.0–100.0)
Monocytes Absolute: 1.5 10*3/uL — ABNORMAL HIGH (ref 0.1–1.0)
Monocytes Relative: 10 %
Neutro Abs: 12.5 10*3/uL — ABNORMAL HIGH (ref 1.7–7.7)
Neutrophils Relative %: 85 %
Platelets: 157 10*3/uL (ref 150–400)
RBC: 5.04 MIL/uL (ref 4.22–5.81)
RDW: 13.4 % (ref 11.5–15.5)
WBC: 14.6 10*3/uL — ABNORMAL HIGH (ref 4.0–10.5)
nRBC: 0 % (ref 0.0–0.2)

## 2019-03-15 LAB — MICROSCOPIC MESSAGE

## 2019-03-15 LAB — RESPIRATORY PANEL BY RT PCR (FLU A&B, COVID)
Influenza A by PCR: NEGATIVE
Influenza B by PCR: NEGATIVE
SARS Coronavirus 2 by RT PCR: NEGATIVE

## 2019-03-15 LAB — PROTIME-INR
INR: 3.6 — ABNORMAL HIGH (ref 0.8–1.2)
Prothrombin Time: 35.6 seconds — ABNORMAL HIGH (ref 11.4–15.2)

## 2019-03-15 LAB — POCT I-STAT CREATININE: Creatinine, Ser: 1.7 mg/dL — ABNORMAL HIGH (ref 0.61–1.24)

## 2019-03-15 MED ORDER — ACETAMINOPHEN 325 MG PO TABS: 650.0000 mg | ORAL_TABLET | Freq: Four times a day (QID) | ORAL | Status: DC | PRN

## 2019-03-15 MED ORDER — SODIUM CHLORIDE (PF) 0.9 % IJ SOLN
INTRAMUSCULAR | Status: AC
  Filled 2019-03-15: qty 50

## 2019-03-15 MED ORDER — ALBUTEROL SULFATE HFA 108 (90 BASE) MCG/ACT IN AERS
1.0000 | INHALATION_SPRAY | RESPIRATORY_TRACT | Status: DC | PRN
  Filled 2019-03-15: qty 6.7

## 2019-03-15 MED ORDER — SODIUM CHLORIDE 0.9 % IV BOLUS
500.0000 mL | Freq: Once | INTRAVENOUS | Status: AC
  Administered 2019-03-15: 500 mL via INTRAVENOUS

## 2019-03-15 MED ORDER — ONDANSETRON HCL 4 MG PO TABS: 4.0000 mg | ORAL_TABLET | Freq: Four times a day (QID) | ORAL | Status: DC | PRN

## 2019-03-15 MED ORDER — METOPROLOL TARTRATE 5 MG/5ML IV SOLN
2.5000 mg | INTRAVENOUS | Status: DC | PRN
  Filled 2019-03-15: qty 5

## 2019-03-15 MED ORDER — PIPERACILLIN-TAZOBACTAM 3.375 G IVPB 30 MIN
3.3750 g | Freq: Once | INTRAVENOUS | Status: AC
  Administered 2019-03-15: 3.375 g via INTRAVENOUS
  Filled 2019-03-15: qty 50

## 2019-03-15 MED ORDER — SODIUM CHLORIDE 0.9 % IV SOLN
INTRAVENOUS | Status: AC
  Filled 2019-03-15: qty 250

## 2019-03-15 MED ORDER — SODIUM CHLORIDE 0.9 % IV SOLN
INTRAVENOUS | Status: DC
  Administered 2019-03-15: 21:00:00 via INTRAVENOUS

## 2019-03-15 MED ORDER — ONDANSETRON HCL 4 MG/2ML IJ SOLN: 4.0000 mg | Freq: Four times a day (QID) | INTRAMUSCULAR | Status: DC | PRN

## 2019-03-15 MED ORDER — ACETAMINOPHEN 650 MG RE SUPP: 650.0000 mg | Freq: Four times a day (QID) | RECTAL | Status: DC | PRN

## 2019-03-15 MED ORDER — ONDANSETRON HCL 4 MG/2ML IJ SOLN
4.0000 mg | Freq: Once | INTRAMUSCULAR | Status: AC
  Administered 2019-03-15: 4 mg via INTRAVENOUS
  Filled 2019-03-15: qty 2

## 2019-03-15 MED ORDER — IOHEXOL 300 MG/ML  SOLN
100.0000 mL | Freq: Once | INTRAMUSCULAR | Status: AC | PRN
  Administered 2019-03-15: 100 mL via INTRAVENOUS

## 2019-03-15 MED ORDER — MORPHINE SULFATE (PF) 4 MG/ML IV SOLN
4.0000 mg | Freq: Once | INTRAVENOUS | Status: AC
  Administered 2019-03-15: 4 mg via INTRAVENOUS
  Filled 2019-03-15: qty 1

## 2019-03-15 MED ORDER — MORPHINE SULFATE (PF) 2 MG/ML IV SOLN: 2.0000 mg | INTRAVENOUS | Status: DC | PRN

## 2019-03-15 MED ORDER — PIPERACILLIN-TAZOBACTAM 3.375 G IVPB
3.3750 g | Freq: Three times a day (TID) | INTRAVENOUS | Status: DC
  Administered 2019-03-16 – 2019-03-20 (×14): 3.375 g via INTRAVENOUS
  Filled 2019-03-15 (×16): qty 50

## 2019-03-15 MED ORDER — ALBUTEROL SULFATE (2.5 MG/3ML) 0.083% IN NEBU: 2.5000 mg | INHALATION_SOLUTION | Freq: Once | RESPIRATORY_TRACT | Status: DC

## 2019-03-15 NOTE — ED Provider Notes (Signed)
[facility] DEPT Provider Note   CSN: [id number] Arrival date & time: 03/15/19  1638     History Chief Complaint  Patient presents with  . Abdominal Pain    [name] is a 63 y.o. male.  HPI      [name] is a 63 y.o. male, with a history of COPD, CAD, artificial heart valve replacement, A. fib, and PVD, presenting to the ED with abdominal pain for the past 4 days.  Pain began in the right lower quadrant and right flank, aching with intermittent sharp, stabbing pain, 7/10, radiating across the lower abdomen. Accompanied by loss of appetite.  Last bowel movement was 4 days ago, but he has still been passing gas. He went to his PCP today, who ordered a CT scan. Denies fever/chills, nausea/vomiting, hematochezia/melena, urinary symptoms, chest pain, shortness of breath, or any other complaints.     Past Medical History:  Diagnosis Date  . COPD (chronic obstructive pulmonary disease) ([place])   . Coronary artery disease   . Hx of artificial heart valve replacement   . PVD (peripheral vascular disease) [facility])     Patient Active Problem List   Diagnosis Date Noted  . COPD (chronic obstructive pulmonary disease) ([place])   . Hyperlipidemia   . Acute appendicitis with rupture   . PVD (peripheral vascular disease) ([place]) 02/24/2018  . H/O mechanical aortic valve replacement 02/24/2018  . H/O mitral valve replacement with mechanical valve 02/24/2018  . Paroxysmal atrial fibrillation ([place]) 02/24/2018  . Essential hypertension 02/24/2018  . Coronary artery disease involving native coronary artery of native heart without angina pectoris 02/17/2018  . Long term (current) use of anticoagulants 02/10/2017    Past Surgical History:  Procedure Laterality Date  . CARDIAC SURGERY         Family History  Problem Relation Age of Onset  . Bladder Cancer Mother   . Lung cancer Mother   . Brain cancer Mother   . Heart disease Father   . Heart  disease Sister   . Diabetes Maternal Grandfather     Social History   Tobacco Use  . Smoking status: Current Every Day Smoker    Types: Cigarettes  . Smokeless tobacco: Never Used  Substance Use Topics  . Alcohol use: Yes  . Drug use: Never    Home Medications Prior to Admission medications   Medication Sig Start Date End Date Taking? Authorizing Provider  aspirin EC 81 MG tablet Take 81 mg by mouth daily.   Yes [provider]  atorvastatin (LIPITOR) 80 MG tablet Take 80 mg by mouth daily.   Yes [provider]  carvedilol (COREG) 3.125 MG tablet Take 3.125 mg by mouth 2 (two) times daily with a meal.  03/01/19  Yes [provider]  enalapril (VASOTEC) 5 MG tablet Take 5 mg by mouth daily.   Yes [provider]  warfarin (COUMADIN) 5 MG tablet Take 5 mg by mouth See admin instructions. Take on Tuesday, Friday   Yes [provider]  warfarin (COUMADIN) 7.5 MG tablet Take 7.5 mg by mouth See admin instructions. Take every day except Tuesday and Friday (take 5 mg dose)   Yes [provider]    Allergies    Patient has no known allergies.  Review of Systems   Review of Systems  Constitutional: Positive for appetite change. Negative for chills and fever.  Respiratory: Negative for shortness of breath.   Cardiovascular: Negative for chest pain.  Gastrointestinal:  Positive for abdominal pain. Negative for blood in stool, diarrhea, nausea and vomiting.  Genitourinary: Negative for dysuria and hematuria.  Musculoskeletal: Negative for back pain.  Neurological: Negative for syncope and weakness.  All other systems reviewed and are negative.   Physical Exam Updated Vital Signs BP 125/75   Pulse 79   Temp 98.7 F (37.1 C) (Oral)   Resp (!) 24   SpO2 93%   Physical Exam Vitals and nursing note reviewed.  Constitutional:      General: He is not in acute distress.    Appearance: He is well-developed. He is not diaphoretic.    HENT:     Head: Normocephalic and atraumatic.     Mouth/Throat:     Mouth: Mucous membranes are moist.     Pharynx: Oropharynx is clear.  Eyes:     Conjunctiva/sclera: Conjunctivae normal.  Cardiovascular:     Rate and Rhythm: Normal rate and regular rhythm.     Pulses: Normal pulses.          Radial pulses are 2+ on the right side and 2+ on the left side.       Posterior tibial pulses are 2+ on the right side and 2+ on the left side.     Heart sounds: Normal heart sounds.     Comments: Tactile temperature in the extremities appropriate and equal bilaterally. Pulmonary:     Effort: Pulmonary effort is normal. No respiratory distress.     Breath sounds: Wheezing present.  Abdominal:     Palpations: Abdomen is soft.     Tenderness: There is abdominal tenderness in the right lower quadrant. There is no guarding. Positive signs include Rovsing's sign.       Comments: He is quite tender in the right lower quadrant.  Palpation in the suprapubic, periumbilical, and left lower quadrant regions elicits pain "shooting" toward the right lower quadrant.  Musculoskeletal:     Cervical back: Neck supple.     Right lower leg: No edema.     Left lower leg: No edema.  Lymphadenopathy:     Cervical: No cervical adenopathy.  Skin:    General: Skin is warm and dry.  Neurological:     Mental Status: He is alert.  Psychiatric:        Mood and Affect: Mood and affect normal.        Speech: Speech normal.        Behavior: Behavior normal.     ED Results / Procedures / Treatments   Labs (all labs ordered are listed, but only abnormal results are displayed) Labs Reviewed  COMPREHENSIVE METABOLIC PANEL - Abnormal; Notable for the following components:      Result Value   Glucose, Bld 125 (*)    BUN 44 (*)    Creatinine, Ser 1.39 (*)    Total Bilirubin 1.6 (*)    GFR calc non Af Amer 54 (*)    All other components within normal limits  CBC WITH DIFFERENTIAL/PLATELET - Abnormal; Notable for  the following components:   WBC 14.6 (*)    Neutro Abs 12.5 (*)    Lymphs Abs 0.6 (*)    Monocytes Absolute 1.5 (*)    Abs Immature Granulocytes 0.11 (*)    All other components within normal limits  PROTIME-INR - Abnormal; Notable for the following components:   Prothrombin Time 35.6 (*)    INR 3.6 (*)    All other components within normal limits  RESPIRATORY PANEL BY RT  PCR (FLU A&B, COVID)   BUN  Date Value Ref Range Status  03/15/2019 44 (H) 8 - 23 mg/dL Final  02/03/2018 9 8 - 23 mg/dL Final   Creatinine, Ser  Date Value Ref Range Status  03/15/2019 1.39 (H) 0.61 - 1.24 mg/dL Final  03/15/2019 1.70 (H) 0.61 - 1.24 mg/dL Final  02/03/2018 0.92 0.61 - 1.24 mg/dL Final    EKG None  Radiology CT ABDOMEN PELVIS W WO CONTRAST  Result Date: 03/15/2019 CLINICAL DATA:  Right lower quadrant pain, hematuria. EXAM: CT ABDOMEN AND PELVIS WITHOUT AND WITH CONTRAST TECHNIQUE: Multidetector CT imaging of the abdomen and pelvis was performed following the standard protocol before and following the bolus administration of intravenous contrast. CONTRAST:  164mL OMNIPAQUE IOHEXOL 300 MG/ML  SOLN COMPARISON:  No prior imaging is available for comparison. FINDINGS: Lower chest: Signs of mitral and aortic valve replacement. No signs of effusion or consolidation at the lung bases. Numerous bilateral calcified pulmonary nodules indicative of prior infection. Hepatobiliary: Liver is normal. Gallbladder is unremarkable and there is no sign of biliary ductal dilation. Pancreas: Pancreas is unremarkable without signs of pancreatic lesion, ductal distension or peripancreatic inflammation. Spleen: Spleen with signs of scarring. Adrenals/Urinary Tract: Adrenal glands are normal. Signs of symmetric renal enhancement. The right ureter is involved with inflammation arising from the right lower quadrant and the appendix. The urinary bladder is normal. Stomach/Bowel: Moderate hiatal hernia. No signs of frank bowel  obstruction involving small bowel but there are numerous fluid-filled dilated loops without clear transition in the setting of acute ruptured appendicitis. A large appendicular lith is noted at the appendiceal orifice at the level of the cecum this measures approximately 13 mm. There is a defect in wall enhancement involving the distal appendix which is dilated to 13 mm. Surrounding this area there is fluid that measures approximately 3.1 x 1.3 cm. Vascular/Lymphatic: Calcified and noncalcified atherosclerotic plaque throughout the abdominal aorta. No signs of adenopathy. No signs of pelvic lymphadenopathy. Scattered small lymph nodes throughout the pelvis. Reproductive: Prostate is unremarkable though heterogeneous, nonspecific finding. Other: Fluid in stranding in the right lower quadrant. Process appears contained to this area largely though there is stranding throughout the right retroperitoneum along the lateral conal fascia and anterior renal fascia. No signs of free air. Trace fluid atop the urinary bladder. Signs of bilateral fat containing inguinal hernias. Musculoskeletal: Signs of sternotomy for valve replacement. No signs of acute bone finding or destructive bone process. IMPRESSION: 1. Findings are consistent with ruptured acute appendicitis with obstructing appendicolith. 2. There is a defect in wall enhancement and non peripherally enhancing adjacent fluid that measures approximately 3.1 x 1.3 cm. 3. Findings of concomitant small-bowel ileus. 4. No signs of urothelial lesion though there is some mild urothelial enhancement which is long segment, presumably due to secondary inflammation in the setting of acute appendicitis. Suggest clinical and imaging follow-up as warranted for reported hematuria. 5. Moderate hiatal hernia. 6. Aortic atherosclerosis. 7. Critical Value/emergent results were called by telephone at the time of interpretation on 03/15/2019 at 4:29 pm to [facility] , who  verbally acknowledged these results. Aortic Atherosclerosis (ICD10-I70.0). Electronically Signed   By: Zetta Bills M.D.   On: 03/15/2019 16:29   DG Chest Portable 1 View  Result Date: 03/15/2019 CLINICAL DATA:  Cough.  History of ruptured appendicitis EXAM: PORTABLE CHEST 1 VIEW COMPARISON:  CT of earlier today FINDINGS: Prior median sternotomy. Apical lordotic positioning. Midline trachea. Mild cardiomegaly. Atherosclerosis in the transverse aorta. No  pleural effusion or pneumothorax. Clear lungs. IMPRESSION: No acute cardiopulmonary disease. Aortic Atherosclerosis (ICD10-I70.0). Electronically Signed   By: Abigail Miyamoto M.D.   On: 03/15/2019 18:11    Procedures Procedures (including critical care time)  Medications Ordered in ED Medications  ondansetron (ZOFRAN) injection 4 mg (has no administration in time range)  morphine 4 MG/ML injection 4 mg (has no administration in time range)  piperacillin-tazobactam (ZOSYN) IVPB 3.375 g (0 g Intravenous Stopped 03/15/19 1908)  sodium chloride 0.9 % bolus 500 mL (500 mLs Intravenous New Bag/Given 03/15/19 1930)    ED Course  I have reviewed the triage vital signs and the nursing notes.  Pertinent labs & imaging results that were available during my care of the patient were reviewed by me and considered in my medical decision making (see chart for details).  Clinical Course as of Mar 14 2044  Mon Mar 15, 2019  [id number] Spoke with Dr. Harlow Asa, general surgeon.   Requests hospitalist admission and requests that they make contact with cardiology for advice on anticoagulation reversal in the setting of a mechanical heart valve. He suspects his plan will be for no surgery tonight, but will contact IR in the morning to discuss the patient's case. He will come by to see the patient.   [SJ]  [place] with Dr. Posey Pronto, hospitalist. Agrees to admit the patient. Requests we make contact with our cardiologist contacts here at [facility] to weigh in on  anticoagulation reversal.  He states consulting with patient's cardiology team out of [facility] may not have his high yield because they would not be able to follow along and assess the patient during his stay.   [SJ]  2016 Spoke with Dr. Marletta Lor, cardiology fellow.  Ideally, we would have the patient stop taking his Coumadin and let the INR drift down naturally.  If more urgent reversal is needed, the next consideration would be oral vitamin K. Would need heparin bridge when INR falls below 2.5.  Recommends pharmacy consult for coordination of any of the strategies.   [SJ]    Clinical Course User Index [SJ] Shanti Eichel C, PA-C   MDM Rules/Calculators/A&P                      Patient presents with several days of abdominal pain and decreased appetite.  Appendicitis with perforation noted on CT.  Leukocytosis of 14.  Afebrile. Increased challenge of management due to patient's anticoagulation status. Admitted via hospitalist with general surgery consulting.  Despite patient's wheezing on exam, he states this is not new for him and denies shortness of breath or other additional chest symptoms.  Findings and plan of care discussed with Lacretia Leigh, MD.  Final Clinical Impression(s) / ED Diagnoses Final diagnoses:  Acute appendicitis with perforation, localized peritonitis, and abscess, unspecified whether gangrene present    Rx / DC Orders ED Discharge Orders    None       Layla Maw 03/15/19 2048    Lacretia Leigh, MD 03/16/19 1126

## 2019-03-15 NOTE — Progress Notes (Signed)
Pharmacy Antibiotic Note  [name] is a 63 y.o. male admitted on 03/15/2019 with acute appendicitis with rupture.  Pharmacy has been consulted for Zosyn dosing-first dose given in the ED.  Plan:  Zosyn 3.375g IV q8h (each dose infused over 4 hours).  Need for further dosage adjustment appears unlikely at present, so pharmacy will sign off at this time.  Please reconsult if a change in clinical status warrants re-evaluation of dosage.      Temp (24hrs), Avg:98.2 F (36.8 C), Min:97.1 F (36.2 C), Max:98.7 F (37.1 C)  Recent Labs  Lab 03/15/19 1529 03/15/19 1744  WBC  --  14.6*  CREATININE 1.70* 1.39*    Estimated Creatinine Clearance: 58.8 mL/min (A) (by C-G formula based on SCr of 1.39 mg/dL (H)).    No Known Allergies  Antimicrobials this admission: 12/14 Zosyn >>  Dose adjustments this admission: --  Microbiology results: 12/14 COVID: negative 12/14 Influenza A/B: negative    Thank you for allowing pharmacy to be a part of this patient's care.  Luiz Ochoa 03/15/2019 9:11 PM

## 2019-03-15 NOTE — Progress Notes (Addendum)
Subjective:    Patient ID: [name], male    DOB: [birth year], 63 y.o.   MRN: [id number]  HPI Pain began Friday.  Patient was waiting at the dentist office to have his teeth cleaned when suddenly he developed a vague discomfort in his lower pelvis.  The pain gradually migrated to his right lower quadrant and became intense.  Since that time he has had no bowel movement.  He reports nausea with no appetite.  Any motion or movement causes intense pain in the right lower quadrant.  Today on exam he has a slightly distended abdomen with hypoactive bowel sounds with voluntary and involuntary guarding in the right lower quadrant.  However he is afebrile and nontoxic-appearing. Past Medical History:  Diagnosis Date  . COPD (chronic obstructive pulmonary disease) ([place])   . Coronary artery disease   . Hx of artificial heart valve replacement    Past Surgical History:  Procedure Laterality Date  . CARDIAC SURGERY     Current Outpatient Medications on File Prior to Visit  Medication Sig Dispense Refill  . aspirin EC 81 MG tablet Take 81 mg by mouth daily.    Marland Kitchen atorvastatin (LIPITOR) 80 MG tablet Take 80 mg by mouth daily.    . enalapril (VASOTEC) 5 MG tablet Take 5 mg by mouth daily.    Marland Kitchen warfarin (COUMADIN) 5 MG tablet Take 5 mg by mouth daily. sunday    . warfarin (COUMADIN) 7.5 MG tablet Take 7.5 mg by mouth daily.     No current facility-administered medications on file prior to visit.   No Known Allergies Social History   Socioeconomic History  . Marital status: Single    Spouse name: Not on file  . Number of children: Not on file  . Years of education: Not on file  . Highest education level: Not on file  Occupational History  . Not on file  Tobacco Use  . Smoking status: Current Every Day Smoker    Types: Cigarettes  . Smokeless tobacco: Never Used  Substance and Sexual Activity  . Alcohol use: Yes  . Drug use: Never  . Sexual activity: Not on file  Other Topics Concern    . Not on file  Social History Narrative  . Not on file   Social Determinants of Health   Financial Resource Strain:   . Difficulty of Paying Living Expenses: Not on file  Food Insecurity:   . Worried About Charity fundraiser in the Last Year: Not on file  . Ran Out of Food in the Last Year: Not on file  Transportation Needs:   . Lack of Transportation (Medical): Not on file  . Lack of Transportation (Non-Medical): Not on file  Physical Activity:   . Days of Exercise per Week: Not on file  . Minutes of Exercise per Session: Not on file  Stress:   . Feeling of Stress : Not on file  Social Connections:   . Frequency of Communication with Friends and Family: Not on file  . Frequency of Social Gatherings with Friends and Family: Not on file  . Attends Religious Services: Not on file  . Active Member of Clubs or Organizations: Not on file  . Attends Archivist Meetings: Not on file  . Marital Status: Not on file  Intimate Partner Violence:   . Fear of Current or Ex-Partner: Not on file  . Emotionally Abused: Not on file  . Physically Abused: Not on file  . Sexually  Abused: Not on file     Review of Systems  All other systems reviewed and are negative.      Objective:   Physical Exam Constitutional:      Appearance: Normal appearance.  Cardiovascular:     Rate and Rhythm: Normal rate and regular rhythm.     Pulses: Normal pulses.     Heart sounds: Murmur present.  Pulmonary:     Effort: Pulmonary effort is normal. No respiratory distress.     Breath sounds: Normal breath sounds. No stridor. No wheezing, rhonchi or rales.  Abdominal:     General: Abdomen is flat. Bowel sounds are normal. There is distension.     Palpations: Abdomen is soft. There is no mass.     Tenderness: There is abdominal tenderness. There is guarding. There is no rebound.     Hernia: No hernia is present.  Neurological:     Mental Status: He is alert.           Assessment &  Plan:  Hematuria, unspecified type - Plan: Urinalysis, Routine w reflex microscopic  Lower abdominal pain - Plan: Urinalysis, Routine w reflex microscopic  Right lower quadrant abdominal pain - Plan: CT Abdomen Pelvis W Contrast  I am concerned that the patient may have appendicitis or some type of perforated viscus.  We will try to schedule a stat CT scan of the abdomen and pelvis to evaluate further.  If this is unavailable, I will send the patient immediately to the emergency room.  Urinalysis does show trace blood however symptoms are inconsistent with a kidney stone.   CT scan confirms ruptured acute appendicitis as dictated below: 1. Findings are consistent with ruptured acute appendicitis with obstructing appendicolith. Spoke directly with the patient who is at [facility] radiology department.  Recommended he go directly to the emergency room.  We will notify the emergency room of his impending arrival.

## 2019-03-15 NOTE — H&P (Signed)
History and Physical    [name] [id number] DOB: [birth year] DOA: 03/15/2019  PCP: [name], MD  Patient coming from: Home  I have personally briefly reviewed patient's old medical records in [place]  Chief Complaint: Right lower quadrant abdominal pain  HPI: [name] is a 63 y.o. male with medical history significant for CAD s/p inferior STEMI s/p DES, severe AS and MV insufficiency s/p mechanical AVR mechanical MV replacement on Coumadin, s/p atrial appendage ligation and maze procedure in 2013, paroxysmal atrial fibrillation, COPD, hypertension, and hyperlipidemia who presents to the ED for evaluation of acute appendicitis.  Patient states about 3 days ago he began having vague right lower abdominal pain.  He felt this was probably a muscle strain however pain became more intense and localized in the right lower quadrant.  He has not been eating or had any bowel movement since onset.  He went to his PCP today who sent him for CT scan due to concern for appendicitis.  CT abdomen/pelvis with and without contrast was performed and did show findings consistent with ruptured acute appendicitis with obstructing appendicolith and adjacent enhancing fluid measuring approximately 3.1 x 1.3 cm.  Findings of small bowel ileus are also noted.  Patient was advised to present to the ED for further management.  Currently patient denies any subjective fevers, chills, diaphoresis, nausea, vomiting, chest pain, cough, or dyspnea.  He is anticoagulated with Coumadin for mechanical aortic and mitral valve replacements and paroxysmal atrial fibrillation.  He last took Coumadin on 03/14/2019.  Patient is a chronic smoker about half pack per day, he states he has not smoked in the last 4 days.  He has a rescue inhaler at home but has not felt the need to use it.  He reports social alcohol use and denies any illicit drug use.  ED Course:  Initial evaluation BP 127/70, pulse 88, RR  20, temp 98.7 Fahrenheit, SPO2 94% on room air.  Labs are notable for WBC 14.6, hemoglobin 15.4, platelets 157,000, sodium 137, potassium 3.6, bicarb 27, BUN 44, creatinine 1.39, AST 32, ALT 22, alk phos 75, total bilirubin 1.6, PT 35.6, INR 3.6.  SARS-CoV-2 PCR test is negative.  Influenza a and B PCR tests are negative.  Portable chest x-ray was negative for acute cardiopulmonary process.  Patient was given 500 cc normal saline and started on IV Zosyn.  EDP discussed with general surgery who will evaluate, currently no plans for surgery tonight but may discuss with IR in the morning.  EDP also discussed with cardiology fellow who recommended holding Coumadin till INR come down on its own, but reversed with vitamin K if needed and bridged with heparin if INR falls below 2.5.  The hospitalist service was consulted to admit for further evaluation and management.  Review of Systems: All systems reviewed and are negative except as documented in history of present illness above.   Past Medical History:  Diagnosis Date  . COPD (chronic obstructive pulmonary disease) ([place])   . Coronary artery disease   . Hx of artificial heart valve replacement   . PVD (peripheral vascular disease) ([place])     Past Surgical History:  Procedure Laterality Date  . CARDIAC SURGERY      Social History:  reports that he has been smoking cigarettes. He has never used smokeless tobacco. He reports current alcohol use. He reports that he does not use drugs.  No Known Allergies  Family History  Problem Relation Age of Onset  .  Bladder Cancer Mother   . Lung cancer Mother   . Brain cancer Mother   . Heart disease Father   . Heart disease Sister   . Diabetes Maternal Grandfather      Prior to Admission medications   Medication Sig Start Date End Date Taking? Authorizing Provider  aspirin EC 81 MG tablet Take 81 mg by mouth daily.   Yes [provider]  atorvastatin (LIPITOR) 80 MG tablet Take 80 mg  by mouth daily.   Yes [provider]  carvedilol (COREG) 3.125 MG tablet Take 3.125 mg by mouth 2 (two) times daily with a meal.  03/01/19  Yes [provider]  enalapril (VASOTEC) 5 MG tablet Take 5 mg by mouth daily.   Yes [provider]  warfarin (COUMADIN) 5 MG tablet Take 5 mg by mouth See admin instructions. Take on Tuesday, Friday   Yes [provider]  warfarin (COUMADIN) 7.5 MG tablet Take 7.5 mg by mouth See admin instructions. Take every day except Tuesday and Friday (take 5 mg dose)   Yes [provider]    Physical Exam: Vitals:   03/15/19 2000 03/15/19 2030 03/15/19 2100 03/15/19 2101  BP: 124/70 125/75 121/87   Pulse: 83 79  79  Resp: (!) 22 (!) 24 19 (!) 25  Temp:      TempSrc:      SpO2: 94% 93%  94%    Constitutional: Resting in the left lateral decubitus position, NAD, calm Eyes: PERRL, lids and conjunctivae normal ENMT: Mucous membranes are moist. Posterior pharynx clear of any exudate or lesions.Normal dentition.  Neck: normal, supple, no masses. Respiratory: Coarse wheezing diffusely fields.  Normal respiratory effort. No accessory muscle use.  Cardiovascular: Regular rate and rhythm, audible valve click. No extremity edema. 2+ pedal pulses. Abdomen: RLQ tenderness, no masses palpated. No hepatosplenomegaly. Bowel sounds diminished.  Musculoskeletal: no clubbing / cyanosis. No joint deformity upper and lower extremities. Good ROM, no contractures. Normal muscle tone.  Skin: no rashes, lesions, ulcers. No induration Neurologic: CN 2-12 grossly intact. Sensation intact, Strength 5/5 in all 4.  Psychiatric: Normal judgment and insight. Alert and oriented x 3. Normal mood.   Labs on Admission: I have personally reviewed following labs and imaging studies  CBC: Recent Labs  Lab 03/15/19 1744  WBC 14.6*  NEUTROABS 12.5*  HGB 15.4  HCT 47.0  MCV 93.3  PLT 809   Basic Metabolic Panel: Recent Labs  Lab  03/15/19 1529 03/15/19 1744  NA  --  137  K  --  3.6  CL  --  100  CO2  --  27  GLUCOSE  --  125*  BUN  --  44*  CREATININE 1.70* 1.39*  CALCIUM  --  8.9   GFR: Estimated Creatinine Clearance: 58.8 mL/min (A) (by C-G formula based on SCr of 1.39 mg/dL (H)). Liver Function Tests: Recent Labs  Lab 03/15/19 1744  AST 32  ALT 22  ALKPHOS 75  BILITOT 1.6*  PROT 7.0  ALBUMIN 4.0   No results for input(s): LIPASE, AMYLASE in the last 168 hours. No results for input(s): AMMONIA in the last 168 hours. Coagulation Profile: Recent Labs  Lab 03/15/19 1744  INR 3.6*   Cardiac Enzymes: No results for input(s): CKTOTAL, CKMB, CKMBINDEX, TROPONINI in the last 168 hours. BNP (last 3 results) No results for input(s): PROBNP in the last 8760 hours. HbA1C: No results for input(s): HGBA1C in the last 72 hours. CBG: No results for  input(s): GLUCAP in the last 168 hours. Lipid Profile: No results for input(s): CHOL, HDL, LDLCALC, TRIG, CHOLHDL, LDLDIRECT in the last 72 hours. Thyroid Function Tests: No results for input(s): TSH, T4TOTAL, FREET4, T3FREE, THYROIDAB in the last 72 hours. Anemia Panel: No results for input(s): VITAMINB12, FOLATE, FERRITIN, TIBC, IRON, RETICCTPCT in the last 72 hours. Urine analysis:    Component Value Date/Time   COLORURINE DARK YELLOW 03/15/2019 1140   APPEARANCEUR CLOUDY (A) 03/15/2019 1140   LABSPEC 1.018 03/15/2019 1140   PHURINE 5.0 03/15/2019 1140   GLUCOSEU NEGATIVE 03/15/2019 1140   HGBUR TRACE (A) 03/15/2019 1140   BILIRUBINUR NEGATIVE 02/03/2018 1409   KETONESUR TRACE (A) 03/15/2019 1140   PROTEINUR 2+ (A) 03/15/2019 1140   NITRITE POSITIVE (A) 03/15/2019 1140   LEUKOCYTESUR NEGATIVE 03/15/2019 1140    Radiological Exams on Admission: CT ABDOMEN PELVIS W WO CONTRAST  Result Date: 03/15/2019 CLINICAL DATA:  Right lower quadrant pain, hematuria. EXAM: CT ABDOMEN AND PELVIS WITHOUT AND WITH CONTRAST TECHNIQUE: Multidetector CT imaging  of the abdomen and pelvis was performed following the standard protocol before and following the bolus administration of intravenous contrast. CONTRAST:  173m OMNIPAQUE IOHEXOL 300 MG/ML  SOLN COMPARISON:  No prior imaging is available for comparison. FINDINGS: Lower chest: Signs of mitral and aortic valve replacement. No signs of effusion or consolidation at the lung bases. Numerous bilateral calcified pulmonary nodules indicative of prior infection. Hepatobiliary: Liver is normal. Gallbladder is unremarkable and there is no sign of biliary ductal dilation. Pancreas: Pancreas is unremarkable without signs of pancreatic lesion, ductal distension or peripancreatic inflammation. Spleen: Spleen with signs of scarring. Adrenals/Urinary Tract: Adrenal glands are normal. Signs of symmetric renal enhancement. The right ureter is involved with inflammation arising from the right lower quadrant and the appendix. The urinary bladder is normal. Stomach/Bowel: Moderate hiatal hernia. No signs of frank bowel obstruction involving small bowel but there are numerous fluid-filled dilated loops without clear transition in the setting of acute ruptured appendicitis. A large appendicular lith is noted at the appendiceal orifice at the level of the cecum this measures approximately 13 mm. There is a defect in wall enhancement involving the distal appendix which is dilated to 13 mm. Surrounding this area there is fluid that measures approximately 3.1 x 1.3 cm. Vascular/Lymphatic: Calcified and noncalcified atherosclerotic plaque throughout the abdominal aorta. No signs of adenopathy. No signs of pelvic lymphadenopathy. Scattered small lymph nodes throughout the pelvis. Reproductive: Prostate is unremarkable though heterogeneous, nonspecific finding. Other: Fluid in stranding in the right lower quadrant. Process appears contained to this area largely though there is stranding throughout the right retroperitoneum along the lateral conal  fascia and anterior renal fascia. No signs of free air. Trace fluid atop the urinary bladder. Signs of bilateral fat containing inguinal hernias. Musculoskeletal: Signs of sternotomy for valve replacement. No signs of acute bone finding or destructive bone process. IMPRESSION: 1. Findings are consistent with ruptured acute appendicitis with obstructing appendicolith. 2. There is a defect in wall enhancement and non peripherally enhancing adjacent fluid that measures approximately 3.1 x 1.3 cm. 3. Findings of concomitant small-bowel ileus. 4. No signs of urothelial lesion though there is some mild urothelial enhancement which is long segment, presumably due to secondary inflammation in the setting of acute appendicitis. Suggest clinical and imaging follow-up as warranted for reported hematuria. 5. Moderate hiatal hernia. 6. Aortic atherosclerosis. 7. Critical Value/emergent results were called by telephone at the time of interpretation on 03/15/2019 at 4:29 pm to providerWARREN  PICKARD , who verbally acknowledged these results. Aortic Atherosclerosis (ICD10-I70.0). Electronically Signed   By: Zetta Bills M.D.   On: 03/15/2019 16:29   DG Chest Portable 1 View  Result Date: 03/15/2019 CLINICAL DATA:  Cough.  History of ruptured appendicitis EXAM: PORTABLE CHEST 1 VIEW COMPARISON:  CT of earlier today FINDINGS: Prior median sternotomy. Apical lordotic positioning. Midline trachea. Mild cardiomegaly. Atherosclerosis in the transverse aorta. No pleural effusion or pneumothorax. Clear lungs. IMPRESSION: No acute cardiopulmonary disease. Aortic Atherosclerosis (ICD10-I70.0). Electronically Signed   By: Abigail Miyamoto M.D.   On: 03/15/2019 18:11    EKG: Independently reviewed. Sinus rhythm without acute ischemic changes.  No prior for comparison.  Assessment/Plan Principal Problem:   Acute appendicitis with rupture Active Problems:   Coronary artery disease involving native coronary artery of native heart  without angina pectoris   H/O mechanical aortic valve replacement   H/O mitral valve replacement with mechanical valve   Paroxysmal atrial fibrillation ([facility])   Essential hypertension   COPD (chronic obstructive pulmonary disease) ([place])   Hyperlipidemia  [name] is a 63 y.o. male with medical history significant for CAD s/p inferior STEMI s/p DES, severe AS and MV insufficiency s/p mechanical AVR mechanical MV replacement on Coumadin, s/p atrial appendage ligation and maze procedure in 2013, paroxysmal atrial fibrillation, COPD, hypertension, and hyperlipidemia who is admitted with acute appendicitis.   Acute appendicitis with rupture: As seen on CT A/P.  General surgery consulted and will see.  Currently no plans for emergent surgery tonight. -Continue IV Zosyn -Continue IV fluid hydration -Continue IV morphine as needed for pain -Will keep n.p.o. for now -Hold Coumadin and aspirin and repeat INR in a.m., can use IV vitamin K if needed for more urgent reversal -General surgery consulted  Small bowel ileus: -Keep n.p.o., continue IV fluids, pain control, and antiemetics as above  S/p mechanical aortic and mitral valve replacement: On Coumadin.  Goal INR would be between 2.5-3.5.  INR on arrival was 3.6.  Will hold Coumadin, can use IV vitamin K if needed for more urgent reversal.  Will need to start IV heparin bridge if INR drops below 2.5.  Paroxysmal atrial fibrillation: In sinus rhythm at time of admission.  Holding Coumadin as above.  Holding Coreg while NPO.  Will use IV metoprolol only as needed for elevated heart rate.  CAD s/p inferior STEMI s/p DES to RCA 10/2017: Chronic and stable, denies any chest pain.  EKG without acute ischemic changes.  Holding aspirin, Coreg, atorvastatin while NPO.  COPD: Not on maintenance therapy yet as an outpatient.  Has coarse wheezing on admission.  He is oxygenating well on room air.  Give albuterol nebulizer once now and continue as  needed albuterol.  Hypertension: Currently normotensive.  Holding home enalapril and Coreg for now.  Hyperlipidemia: Holding atorvastatin as above.  Tobacco use: Reports smoking half pack per day.  Has not smoked for the last 4 days and does not plan to restart.   DVT prophylaxis: SCDs Code Status: Full code, confirmed with patient Family Communication: Discussed with patient, he has discussed with his wife Disposition Plan: Pending clinical progress Consults called: General surgery to follow, EDP discussed with cardiology by phone Admission status: Inpatient for management of appendicitis.   Zada Finders MD [facility]  If 7PM-7AM, please contact night-coverage [email or web address]  03/15/2019, 9:04 PM

## 2019-03-15 NOTE — ED Triage Notes (Signed)
Pt sent by PCP for ruptured appendix, shown by CT scan. Pt has RLQ pain x 3 days, no nausea/vomiting.

## 2019-03-15 NOTE — ED Notes (Signed)
Pt's wife here requesting to come back to see pt.  Advised that until covid results come back we are unable to allow her back but that she can leave her contact information and we can update her when possible.  Security relayed message and obtained contact information.  Kenichi Treu [phone]

## 2019-03-15 NOTE — Consult Note (Signed)
General Surgery [facility]  Reason for Consult: acute appendicitis with perforation  Referring Physician: Dr. Lacretia Leigh, [facility]  [name] is an 63 y.o. male.  HPI: Patient is a 63 year old male who presents to the emergency department with a 4-day history of right-sided abdominal pain.  Patient first developed mild mid right abdominal pain which migrated to the right lower quadrant.  Pain has been persistent over the past 4 days and slowly becoming more severe.  Patient denies fevers or chills.  He denies nausea or vomiting.  He denies diarrhea or constipation.  Patient is anorectic over the weekend.  He has had no prior abdominal surgery.  He was seen and evaluated by his primary care physician and referred to the emergency department for evaluation.  White blood cell count was elevated at greater than 14,000.  CT scan of the abdomen and pelvis shows findings consistent with perforated acute appendicitis with appendicolith and approximately a 3 cm fluid collection representing early abscess.  General surgery is called for recommendations for management.  Patient relocated to [facility] 2 years ago from [place].  He has had both aortic valve replacement and mitral valve replacement and is on chronic anticoagulation with Coumadin.  He is excessively anticoagulated at present with a PT level of greater than 35 and an INR of 3.6.  Past Medical History:  Diagnosis Date  . COPD (chronic obstructive pulmonary disease) ([place])   . Coronary artery disease   . Hx of artificial heart valve replacement   . PVD (peripheral vascular disease) ([place])     Past Surgical History:  Procedure Laterality Date  . CARDIAC SURGERY      Family History  Problem Relation Age of Onset  . Bladder Cancer Mother   . Lung cancer Mother   . Brain cancer Mother   . Heart disease Father   . Heart disease Sister   . Diabetes Maternal Grandfather     Social History:  reports that  he has been smoking cigarettes. He has never used smokeless tobacco. He reports current alcohol use. He reports that he does not use drugs.  Allergies: No Known Allergies  Medications: I have reviewed the patient's current medications.  Results for orders placed or performed during the hospital encounter of 03/15/19 (from the past 48 hour(s))  Comprehensive metabolic panel     Status: Abnormal   Collection Time: 03/15/19  5:44 PM  Result Value Ref Range   Sodium 137 135 - 145 mmol/L   Potassium 3.6 3.5 - 5.1 mmol/L   Chloride 100 98 - 111 mmol/L   CO2 27 22 - 32 mmol/L   Glucose, Bld 125 (H) 70 - 99 mg/dL   BUN 44 (H) 8 - 23 mg/dL   Creatinine, Ser 1.39 (H) 0.61 - 1.24 mg/dL   Calcium 8.9 8.9 - 10.3 mg/dL   Total Protein 7.0 6.5 - 8.1 g/dL   Albumin 4.0 3.5 - 5.0 g/dL   AST 32 15 - 41 U/L   ALT 22 0 - 44 U/L   Alkaline Phosphatase 75 38 - 126 U/L   Total Bilirubin 1.6 (H) 0.3 - 1.2 mg/dL   GFR calc non Af Amer 54 (L) >60 mL/min   GFR calc Af Amer >60 >60 mL/min   Anion gap 10 5 - 15    Comment: Performed at [facility], [place] [address], [place], [place] [address]  CBC with Differential     Status: Abnormal  Collection Time: 03/15/19  5:44 PM  Result Value Ref Range   WBC 14.6 (H) 4.0 - 10.5 K/uL   RBC 5.04 4.22 - 5.81 MIL/uL   Hemoglobin 15.4 13.0 - 17.0 g/dL   HCT 47.0 39.0 - 52.0 %   MCV 93.3 80.0 - 100.0 fL   MCH 30.6 26.0 - 34.0 pg   MCHC 32.8 30.0 - 36.0 g/dL   RDW 13.4 11.5 - 15.5 %   Platelets 157 150 - 400 K/uL   nRBC 0.0 0.0 - 0.2 %   Neutrophils Relative % 85 %   Neutro Abs 12.5 (H) 1.7 - 7.7 K/uL   Lymphocytes Relative 4 %   Lymphs Abs 0.6 (L) 0.7 - 4.0 K/uL   Monocytes Relative 10 %   Monocytes Absolute 1.5 (H) 0.1 - 1.0 K/uL   Eosinophils Relative 0 %   Eosinophils Absolute 0.0 0.0 - 0.5 K/uL   Basophils Relative 0 %   Basophils Absolute 0.0 0.0 - 0.1 K/uL   Immature Granulocytes 1 %   Abs Immature Granulocytes 0.11 (H) 0.00 - 0.07  K/uL    Comment: Performed at [facility], [place] [address], [place], [place] [address]  Protime-INR     Status: Abnormal   Collection Time: 03/15/19  5:44 PM  Result Value Ref Range   Prothrombin Time 35.6 (H) 11.4 - 15.2 seconds   INR 3.6 (H) 0.8 - 1.2    Comment: (NOTE) INR goal varies based on device and disease states. Performed at [facility], [place] [address] [place], [place] [address]   Respiratory Panel by RT PCR (Flu A&B, Covid) - Nasopharyngeal Swab     Status: None   Collection Time: 03/15/19  5:44 PM   Specimen: Nasopharyngeal Swab  Result Value Ref Range   SARS Coronavirus 2 by RT PCR NEGATIVE NEGATIVE    Comment: (NOTE) SARS-CoV-2 target nucleic acids are NOT DETECTED. The SARS-CoV-2 RNA is generally detectable in upper respiratoy specimens during the acute phase of infection. The lowest concentration of SARS-CoV-2 viral copies this assay can detect is 131 copies/mL. A negative result does not preclude SARS-Cov-2 infection and should not be used as the sole basis for treatment or other patient management decisions. A negative result may occur with  improper specimen collection/handling, submission of specimen other than nasopharyngeal swab, presence of viral mutation(s) within the areas targeted by this assay, and inadequate number of viral copies (<131 copies/mL). A negative result must be combined with clinical observations, patient history, and epidemiological information. The expected result is Negative. Fact Sheet for Patients:  PinkCheek.be Fact Sheet for Healthcare Providers:  GravelBags.it This test is not yet ap proved or cleared by the [place] FDA and  has been authorized for detection and/or diagnosis of SARS-CoV-2 by FDA under an Emergency Use Authorization (EUA). This EUA will remain  in effect (meaning this test can be used) for the duration of  the COVID-19 declaration under Section 564(b)(1) of the Act, 21 U.S.C. section 360bbb-3(b)(1), unless the authorization is terminated or revoked sooner.    Influenza A by PCR NEGATIVE NEGATIVE   Influenza B by PCR NEGATIVE NEGATIVE    Comment: (NOTE) The Xpert Xpress SARS-CoV-2/FLU/RSV assay is intended as an aid in  the diagnosis of influenza from Nasopharyngeal swab specimens and  should not be used as a sole basis for treatment. Nasal washings and  aspirates are unacceptable for Xpert Xpress SARS-CoV-2/FLU/RSV  testing. Fact Sheet for Patients: PinkCheek.be Fact Sheet for  Healthcare Providers: GravelBags.it This test is not yet approved or cleared by the [place] and  has been authorized for detection and/or diagnosis of SARS-CoV-2 by  FDA under an Emergency Use Authorization (EUA). This EUA will remain  in effect (meaning this test can be used) for the duration of the  Covid-19 declaration under Section 564(b)(1) of the Act, 21  U.S.C. section 360bbb-3(b)(1), unless the authorization is  terminated or revoked. Performed at [facility], [place] [address], [place], [place] [address]     CT ABDOMEN PELVIS W WO CONTRAST  Result Date: 03/15/2019 CLINICAL DATA:  Right lower quadrant pain, hematuria. EXAM: CT ABDOMEN AND PELVIS WITHOUT AND WITH CONTRAST TECHNIQUE: Multidetector CT imaging of the abdomen and pelvis was performed following the standard protocol before and following the bolus administration of intravenous contrast. CONTRAST:  176mL OMNIPAQUE IOHEXOL 300 MG/ML  SOLN COMPARISON:  No prior imaging is available for comparison. FINDINGS: Lower chest: Signs of mitral and aortic valve replacement. No signs of effusion or consolidation at the lung bases. Numerous bilateral calcified pulmonary nodules indicative of prior infection. Hepatobiliary: Liver is normal. Gallbladder is unremarkable and there  is no sign of biliary ductal dilation. Pancreas: Pancreas is unremarkable without signs of pancreatic lesion, ductal distension or peripancreatic inflammation. Spleen: Spleen with signs of scarring. Adrenals/Urinary Tract: Adrenal glands are normal. Signs of symmetric renal enhancement. The right ureter is involved with inflammation arising from the right lower quadrant and the appendix. The urinary bladder is normal. Stomach/Bowel: Moderate hiatal hernia. No signs of frank bowel obstruction involving small bowel but there are numerous fluid-filled dilated loops without clear transition in the setting of acute ruptured appendicitis. A large appendicular lith is noted at the appendiceal orifice at the level of the cecum this measures approximately 13 mm. There is a defect in wall enhancement involving the distal appendix which is dilated to 13 mm. Surrounding this area there is fluid that measures approximately 3.1 x 1.3 cm. Vascular/Lymphatic: Calcified and noncalcified atherosclerotic plaque throughout the abdominal aorta. No signs of adenopathy. No signs of pelvic lymphadenopathy. Scattered small lymph nodes throughout the pelvis. Reproductive: Prostate is unremarkable though heterogeneous, nonspecific finding. Other: Fluid in stranding in the right lower quadrant. Process appears contained to this area largely though there is stranding throughout the right retroperitoneum along the lateral conal fascia and anterior renal fascia. No signs of free air. Trace fluid atop the urinary bladder. Signs of bilateral fat containing inguinal hernias. Musculoskeletal: Signs of sternotomy for valve replacement. No signs of acute bone finding or destructive bone process. IMPRESSION: 1. Findings are consistent with ruptured acute appendicitis with obstructing appendicolith. 2. There is a defect in wall enhancement and non peripherally enhancing adjacent fluid that measures approximately 3.1 x 1.3 cm. 3. Findings of concomitant  small-bowel ileus. 4. No signs of urothelial lesion though there is some mild urothelial enhancement which is long segment, presumably due to secondary inflammation in the setting of acute appendicitis. Suggest clinical and imaging follow-up as warranted for reported hematuria. 5. Moderate hiatal hernia. 6. Aortic atherosclerosis. 7. Critical Value/emergent results were called by telephone at the time of interpretation on 03/15/2019 at 4:29 pm to [facility] , who verbally acknowledged these results. Aortic Atherosclerosis (ICD10-I70.0). Electronically Signed   By: Zetta Bills M.D.   On: 03/15/2019 16:29   DG Chest Portable 1 View  Result Date: 03/15/2019 CLINICAL DATA:  Cough.  History of ruptured appendicitis EXAM: PORTABLE CHEST 1 VIEW COMPARISON:  CT  of earlier today FINDINGS: Prior median sternotomy. Apical lordotic positioning. Midline trachea. Mild cardiomegaly. Atherosclerosis in the transverse aorta. No pleural effusion or pneumothorax. Clear lungs. IMPRESSION: No acute cardiopulmonary disease. Aortic Atherosclerosis (ICD10-I70.0). Electronically Signed   By: Abigail Miyamoto M.D.   On: 03/15/2019 18:11    Review of Systems  Constitutional: Positive for appetite change. Negative for chills, diaphoresis and fever.  HENT: Negative.   Eyes: Negative.   Respiratory: Negative.   Cardiovascular: Negative.   Gastrointestinal: Positive for abdominal pain. Negative for abdominal distention, constipation, diarrhea, nausea and vomiting.  Endocrine: Negative.   Genitourinary: Negative.   Musculoskeletal: Negative.   Skin: Negative.   Allergic/Immunologic: Negative.   Neurological: Negative.   Hematological: Negative.   Psychiatric/Behavioral: Negative.    Blood pressure 121/87, pulse 79, temperature 98.7 F (37.1 C), temperature source Oral, resp. rate (!) 25, SpO2 94 %. Physical Exam  Constitutional: He is oriented to person, place, and time. He appears well-developed and  well-nourished. No distress.  HENT:  Head: Normocephalic and atraumatic.  Right Ear: External ear normal.  Left Ear: External ear normal.  Eyes: Pupils are equal, round, and reactive to light. Conjunctivae are normal. No scleral icterus.  Neck: No tracheal deviation present. No thyromegaly present.  Cardiovascular: Normal rate and regular rhythm.  Heart sounds consistent with mechanical valve replacements, sharp, crisp, no significant murmur  Respiratory: Effort normal. No respiratory distress. He has wheezes. He has no rales. He exhibits no tenderness.  Positive bilateral rhonchi  GI: He exhibits distension. He exhibits no mass. There is abdominal tenderness (max right lower quadrant, some mid abdominal tenderness). There is guarding. There is no rebound.  Musculoskeletal:        General: No deformity or edema. Normal range of motion.     Cervical back: Normal range of motion and neck supple.  Lymphadenopathy:    He has no cervical adenopathy.  Neurological: He is alert and oriented to person, place, and time.  Skin: Skin is warm and dry. He is not diaphoretic.  Psychiatric: He has a normal mood and affect. His behavior is normal.    Assessment/Plan: Acute appendicitis with perforation and early abscess formation, appendicolith - agree with admission to medical service - IV Zosyn started in ER - hold anticoagulation - discuss method of reversal with cardiology - will consult interventional radiology in AM 12/15 for possible percutaneous drainage procedure when anticoagulation reversed - may require operative intervention at some point - discussed with patient  Will follow closely with you.  Appreciate medical admission and management.  Armandina Gemma, MD [facility] Office: [place] 03/15/2019, 9:26 PM

## 2019-03-15 NOTE — Addendum Note (Signed)
Addended by: Sheral Flow on: 03/15/2019 12:45 PM   Modules accepted: Orders

## 2019-03-16 LAB — BASIC METABOLIC PANEL
Anion gap: 10 (ref 5–15)
BUN: 36 mg/dL — ABNORMAL HIGH (ref 8–23)
CO2: 26 mmol/L (ref 22–32)
Calcium: 8.3 mg/dL — ABNORMAL LOW (ref 8.9–10.3)
Chloride: 101 mmol/L (ref 98–111)
Creatinine, Ser: 1.09 mg/dL (ref 0.61–1.24)
GFR calc Af Amer: >60 mL/min (ref >60)
GFR calc non Af Amer: >60 mL/min (ref >60)
Glucose, Bld: 120 mg/dL — ABNORMAL HIGH (ref 70–99)
Potassium: 3.4 mmol/L — ABNORMAL LOW (ref 3.5–5.1)
Sodium: 137 mmol/L (ref 135–145)

## 2019-03-16 LAB — PROTIME-INR
INR: 5 (ref 0.8–1.2)
Prothrombin Time: 46.2 seconds — ABNORMAL HIGH (ref 11.4–15.2)

## 2019-03-16 LAB — CBC
HCT: 41.7 % (ref 39.0–52.0)
Hemoglobin: 13.1 g/dL (ref 13.0–17.0)
MCH: 30.2 pg (ref 26.0–34.0)
MCHC: 31.4 g/dL (ref 30.0–36.0)
MCV: 96.1 fL (ref 80.0–100.0)
Platelets: 144 10*3/uL — ABNORMAL LOW (ref 150–400)
RBC: 4.34 MIL/uL (ref 4.22–5.81)
RDW: 13.3 % (ref 11.5–15.5)
WBC: 12.6 10*3/uL — ABNORMAL HIGH (ref 4.0–10.5)
nRBC: 0 % (ref 0.0–0.2)

## 2019-03-16 LAB — HIV ANTIBODY (ROUTINE TESTING W REFLEX): HIV Screen 4th Generation wRfx: NONREACTIVE

## 2019-03-16 MED ORDER — VITAMIN K1 10 MG/ML IJ SOLN
1.0000 mg | Freq: Once | INTRAVENOUS | Status: AC
  Administered 2019-03-16: 1 mg via INTRAVENOUS
  Filled 2019-03-16: qty 0.1

## 2019-03-16 MED ORDER — POTASSIUM CHLORIDE 10 MEQ/100ML IV SOLN
10.0000 meq | INTRAVENOUS | Status: AC
  Administered 2019-03-16 (×2): 10 meq via INTRAVENOUS
  Filled 2019-03-16 (×3): qty 100

## 2019-03-16 MED ORDER — SODIUM CHLORIDE 0.9 % IV SOLN: INTRAVENOUS | Status: DC

## 2019-03-16 NOTE — ED Notes (Signed)
Pt transferred to hospital bed.  Declined to perform daily cares at this time. Will continue to monitor, call bell within reach.

## 2019-03-16 NOTE — Progress Notes (Signed)
Patient is NPO but does not have fluid orders. MD paged for orders.

## 2019-03-16 NOTE — ED Notes (Signed)
Pt voided 200 ml with urinal at bedside without assitance

## 2019-03-16 NOTE — Progress Notes (Addendum)
[facility] Progress Note     Subjective: CC-  States that he is feeling a little better. Continues to have RLQ pain but it is better than yesterday. Denies n/v. INR 5 this AM. Currently getting 1mg  phytonadione. WBC trending down 12.6, afebrile.  Objective: Vital signs in last 24 hours: Temp:  [97.1 F (36.2 C)-98.7 F (37.1 C)] 98.7 F (37.1 C) (12/14 1727) Pulse Rate:  [38-98] 74 (12/15 0830) Resp:  [16-25] 17 (12/15 0900) BP: (94-143)/(53-87) 136/65 (12/15 0900) SpO2:  [91 %-99 %] 98 % (12/15 0830) Weight:  [88.5 kg] 88.5 kg (12/14 1139)    Intake/Output from previous day: 12/14 0701 - 12/15 0700 In: 550 [IV Piggyback:550] Out: -  Intake/Output this shift: Total I/O In: 1262.5 [I.V.:1262.5] Out: -   PE: Gen:  Alert, NAD, pleasant HEENT: EOM's intact, pupils equal and round Card:  Regular rate, mechanical heart valve clicks Pulm:  CTAB, trace expiratory wheezing bilaterally, rate and effort normal Abd: Soft, distended, focal RLQ TTP with guarding, no rebound or peritonitis, hypoactive BS, no HSM, no hernia Psych: A&Ox3  Skin: no rashes noted, warm and dry  Lab Results:  Recent Labs    03/15/19 1744 03/16/19 0556  WBC 14.6* 12.6*  HGB 15.4 13.1  HCT 47.0 41.7  PLT 157 144*   BMET Recent Labs    03/15/19 1744 03/16/19 0556  NA 137 137  K 3.6 3.4*  CL 100 101  CO2 27 26  GLUCOSE 125* 120*  BUN 44* 36*  CREATININE 1.39* 1.09  CALCIUM 8.9 8.3*   PT/INR Recent Labs    03/15/19 1744 03/16/19 0556  LABPROT 35.6* 46.2*  INR 3.6* 5.0*   CMP     Component Value Date/Time   NA 137 03/16/2019 0556   K 3.4 (L) 03/16/2019 0556   CL 101 03/16/2019 0556   CO2 26 03/16/2019 0556   GLUCOSE 120 (H) 03/16/2019 0556   BUN 36 (H) 03/16/2019 0556   CREATININE 1.09 03/16/2019 0556   CALCIUM 8.3 (L) 03/16/2019 0556   PROT 7.0 03/15/2019 1744   ALBUMIN 4.0 03/15/2019 1744   AST 32 03/15/2019 1744   ALT 22 03/15/2019 1744   ALKPHOS 75  03/15/2019 1744   BILITOT 1.6 (H) 03/15/2019 1744   GFRNONAA >60 03/16/2019 0556   GFRAA >60 03/16/2019 0556   Lipase     Component Value Date/Time   LIPASE 39 02/03/2018 1216       Studies/Results: CT ABDOMEN PELVIS W WO CONTRAST  Result Date: 03/15/2019 CLINICAL DATA:  Right lower quadrant pain, hematuria. EXAM: CT ABDOMEN AND PELVIS WITHOUT AND WITH CONTRAST TECHNIQUE: Multidetector CT imaging of the abdomen and pelvis was performed following the standard protocol before and following the bolus administration of intravenous contrast. CONTRAST:  177mL OMNIPAQUE IOHEXOL 300 MG/ML  SOLN COMPARISON:  No prior imaging is available for comparison. FINDINGS: Lower chest: Signs of mitral and aortic valve replacement. No signs of effusion or consolidation at the lung bases. Numerous bilateral calcified pulmonary nodules indicative of prior infection. Hepatobiliary: Liver is normal. Gallbladder is unremarkable and there is no sign of biliary ductal dilation. Pancreas: Pancreas is unremarkable without signs of pancreatic lesion, ductal distension or peripancreatic inflammation. Spleen: Spleen with signs of scarring. Adrenals/Urinary Tract: Adrenal glands are normal. Signs of symmetric renal enhancement. The right ureter is involved with inflammation arising from the right lower quadrant and the appendix. The urinary bladder is normal. Stomach/Bowel: Moderate hiatal hernia. No signs of frank bowel obstruction involving  small bowel but there are numerous fluid-filled dilated loops without clear transition in the setting of acute ruptured appendicitis. A large appendicular lith is noted at the appendiceal orifice at the level of the cecum this measures approximately 13 mm. There is a defect in wall enhancement involving the distal appendix which is dilated to 13 mm. Surrounding this area there is fluid that measures approximately 3.1 x 1.3 cm. Vascular/Lymphatic: Calcified and noncalcified atherosclerotic  plaque throughout the abdominal aorta. No signs of adenopathy. No signs of pelvic lymphadenopathy. Scattered small lymph nodes throughout the pelvis. Reproductive: Prostate is unremarkable though heterogeneous, nonspecific finding. Other: Fluid in stranding in the right lower quadrant. Process appears contained to this area largely though there is stranding throughout the right retroperitoneum along the lateral conal fascia and anterior renal fascia. No signs of free air. Trace fluid atop the urinary bladder. Signs of bilateral fat containing inguinal hernias. Musculoskeletal: Signs of sternotomy for valve replacement. No signs of acute bone finding or destructive bone process. IMPRESSION: 1. Findings are consistent with ruptured acute appendicitis with obstructing appendicolith. 2. There is a defect in wall enhancement and non peripherally enhancing adjacent fluid that measures approximately 3.1 x 1.3 cm. 3. Findings of concomitant small-bowel ileus. 4. No signs of urothelial lesion though there is some mild urothelial enhancement which is long segment, presumably due to secondary inflammation in the setting of acute appendicitis. Suggest clinical and imaging follow-up as warranted for reported hematuria. 5. Moderate hiatal hernia. 6. Aortic atherosclerosis. 7. Critical Value/emergent results were called by telephone at the time of interpretation on 03/15/2019 at 4:29 pm to [facility] , who verbally acknowledged these results. Aortic Atherosclerosis (ICD10-I70.0). Electronically Signed   By: Zetta Bills M.D.   On: 03/15/2019 16:29   DG Chest Portable 1 View  Result Date: 03/15/2019 CLINICAL DATA:  Cough.  History of ruptured appendicitis EXAM: PORTABLE CHEST 1 VIEW COMPARISON:  CT of earlier today FINDINGS: Prior median sternotomy. Apical lordotic positioning. Midline trachea. Mild cardiomegaly. Atherosclerosis in the transverse aorta. No pleural effusion or pneumothorax. Clear lungs.  IMPRESSION: No acute cardiopulmonary disease. Aortic Atherosclerosis (ICD10-I70.0). Electronically Signed   By: Abigail Miyamoto M.D.   On: 03/15/2019 18:11    Anti-infectives: Anti-infectives (From admission, onward)   Start     Dose/Rate Route Frequency Ordered Stop   03/16/19 0000  piperacillin-tazobactam (ZOSYN) IVPB 3.375 g     3.375 g 12.5 mL/hr over 240 Minutes Intravenous Every 8 hours 03/15/19 2110     03/15/19 1730  piperacillin-tazobactam (ZOSYN) IVPB 3.375 g     3.375 g 100 mL/hr over 30 Minutes Intravenous  Once 03/15/19 1728 03/15/19 1908       Assessment/Plan S/p mechanical aortic and mitral valve replacement  On chronic anticoagulation - coumadin on hold CAD, STEMI s/p DES to RCA 10/2017 HTN COPD HLD Tobacco abuse  Acute appendicitis with perforation and early abscess formation, appendicolith - CT scan 12/14 showed acute appendicitis with obstructing appendicolith and adjacent fluid collection 3.1 x 1.3 cm; ileus - Patient is stable, no role for acute surgical intervention. Discussed with IR, fluid collection too small to drain. They recommend continuing medical management and consider repeat CT scan if patient not improving.  Continue IV zosyn. We will continue to follow. Hold coumadin.  ID - zosyn 12/14>> FEN - IVF, NPO VTE - SCDs, coumadin on hold Foley - none Follow up - TBD   LOS: 1 day    [place]ellington [place], [facility] 03/16/2019,  11:11 AM Please see Amion for pager number during day hours 7:00am-4:30pm

## 2019-03-16 NOTE — Progress Notes (Signed)
ED TO INPATIENT HANDOFF REPORT  ED Nurse Name and Phone #: Lavonda Thal [phone]   Name/Age/Gender [name] 63 y.o. male Room/Bed: WA31/WA31  Code Status   Code Status: Full Code  Home/SNF/Other Home Patient oriented to: self Is this baseline? Yes   Triage Complete: Triage complete  Chief Complaint Acute appendicitis with rupture [K35.32]  Triage Note Pt sent by PCP for ruptured appendix, shown by CT scan. Pt has RLQ pain x 3 days, no nausea/vomiting.     Allergies No Known Allergies  Level of Care/Admitting Diagnosis ED Disposition    ED Disposition Condition Comment   Admit  Hospital Area: [facility] [[id number]]  Level of Care: Med-Surg [16]  Covid Evaluation: Confirmed COVID Negative  Diagnosis: Acute appendicitis with rupture [id number]  Admitting Physician: Lenore Cordia [id number]  Attending Physician: Lenore Cordia [id number]  Estimated length of stay: past midnight tomorrow  Certification:: I certify this patient will need inpatient services for at least 2 midnights       B Medical/Surgery History Past Medical History:  Diagnosis Date  . COPD (chronic obstructive pulmonary disease) ([place])   . Coronary artery disease   . Hx of artificial heart valve replacement   . PVD (peripheral vascular disease) ([place])    Past Surgical History:  Procedure Laterality Date  . CARDIAC SURGERY       A IV Location/Drains/Wounds Patient Lines/Drains/Airways Status   Active Line/Drains/Airways    Name:   Placement date:   Placement time:   Site:   Days:   Peripheral IV 03/15/19 Left Hand   03/15/19    1743    Hand   1   Peripheral IV 03/16/19 Left Antecubital   03/16/19    0908    Antecubital   less than 1          Intake/Output Last 24 hours  Intake/Output Summary (Last 24 hours) at 03/16/2019 2203 Last data filed at 03/16/2019 2146 Gross per 24 hour  Intake 1612.5 ml  Output -  Net 1612.5 ml    Labs/Imaging Results for orders  placed or performed during the hospital encounter of 03/15/19 (from the past 48 hour(s))  Comprehensive metabolic panel     Status: Abnormal   Collection Time: 03/15/19  5:44 PM  Result Value Ref Range   Sodium 137 135 - 145 mmol/L   Potassium 3.6 3.5 - 5.1 mmol/L   Chloride 100 98 - 111 mmol/L   CO2 27 22 - 32 mmol/L   Glucose, Bld 125 (H) 70 - 99 mg/dL   BUN 44 (H) 8 - 23 mg/dL   Creatinine, Ser 1.39 (H) 0.61 - 1.24 mg/dL   Calcium 8.9 8.9 - 10.3 mg/dL   Total Protein 7.0 6.5 - 8.1 g/dL   Albumin 4.0 3.5 - 5.0 g/dL   AST 32 15 - 41 U/L   ALT 22 0 - 44 U/L   Alkaline Phosphatase 75 38 - 126 U/L   Total Bilirubin 1.6 (H) 0.3 - 1.2 mg/dL   GFR calc non Af Amer 54 (L) >60 mL/min   GFR calc Af Amer >60 >60 mL/min   Anion gap 10 5 - 15    Comment: Performed at [facility], [place] [address] [place], [place] [address]  CBC with Differential     Status: Abnormal   Collection Time: 03/15/19  5:44 PM  Result Value Ref Range   WBC 14.6 (H) 4.0 - 10.5 K/uL   RBC 5.04 4.22 -  5.81 MIL/uL   Hemoglobin 15.4 13.0 - 17.0 g/dL   HCT 47.0 39.0 - 52.0 %   MCV 93.3 80.0 - 100.0 fL   MCH 30.6 26.0 - 34.0 pg   MCHC 32.8 30.0 - 36.0 g/dL   RDW 13.4 11.5 - 15.5 %   Platelets 157 150 - 400 K/uL   nRBC 0.0 0.0 - 0.2 %   Neutrophils Relative % 85 %   Neutro Abs 12.5 (H) 1.7 - 7.7 K/uL   Lymphocytes Relative 4 %   Lymphs Abs 0.6 (L) 0.7 - 4.0 K/uL   Monocytes Relative 10 %   Monocytes Absolute 1.5 (H) 0.1 - 1.0 K/uL   Eosinophils Relative 0 %   Eosinophils Absolute 0.0 0.0 - 0.5 K/uL   Basophils Relative 0 %   Basophils Absolute 0.0 0.0 - 0.1 K/uL   Immature Granulocytes 1 %   Abs Immature Granulocytes 0.11 (H) 0.00 - 0.07 K/uL    Comment: Performed at [facility], [place] [address] [place], [place] [address]  Protime-INR     Status: Abnormal   Collection Time: 03/15/19  5:44 PM  Result Value Ref Range   Prothrombin Time 35.6 (H) 11.4 - 15.2 seconds   INR  3.6 (H) 0.8 - 1.2    Comment: (NOTE) INR goal varies based on device and disease states. Performed at [facility], [place] [address], [place],  [place] [address]   Respiratory Panel by RT PCR (Flu A&B, Covid) - Nasopharyngeal Swab     Status: None   Collection Time: 03/15/19  5:44 PM   Specimen: Nasopharyngeal Swab  Result Value Ref Range   SARS Coronavirus 2 by RT PCR NEGATIVE NEGATIVE    Comment: (NOTE) SARS-CoV-2 target nucleic acids are NOT DETECTED. The SARS-CoV-2 RNA is generally detectable in upper respiratoy specimens during the acute phase of infection. The lowest concentration of SARS-CoV-2 viral copies this assay can detect is 131 copies/mL. A negative result does not preclude SARS-Cov-2 infection and should not be used as the sole basis for treatment or other patient management decisions. A negative result may occur with  improper specimen collection/handling, submission of specimen other than nasopharyngeal swab, presence of viral mutation(s) within the areas targeted by this assay, and inadequate number of viral copies (<131 copies/mL). A negative result must be combined with clinical observations, patient history, and epidemiological information. The expected result is Negative. Fact Sheet for Patients:  PinkCheek.be Fact Sheet for Healthcare Providers:  GravelBags.it This test is not yet ap proved or cleared by the [place] FDA and  has been authorized for detection and/or diagnosis of SARS-CoV-2 by FDA under an Emergency Use Authorization (EUA). This EUA will remain  in effect (meaning this test can be used) for the duration of the COVID-19 declaration under Section 564(b)(1) of the Act, 21 U.S.C. section 360bbb-3(b)(1), unless the authorization is terminated or revoked sooner.    Influenza A by PCR NEGATIVE NEGATIVE   Influenza B by PCR NEGATIVE NEGATIVE    Comment: (NOTE) The Xpert  Xpress SARS-CoV-2/FLU/RSV assay is intended as an aid in  the diagnosis of influenza from Nasopharyngeal swab specimens and  should not be used as a sole basis for treatment. Nasal washings and  aspirates are unacceptable for Xpert Xpress SARS-CoV-2/FLU/RSV  testing. Fact Sheet for Patients: PinkCheek.be Fact Sheet for Healthcare Providers: GravelBags.it This test is not yet approved or cleared by the [place] FDA and  has been authorized for detection and/or diagnosis of  SARS-CoV-2 by  FDA under an Emergency Use Authorization (EUA). This EUA will remain  in effect (meaning this test can be used) for the duration of the  Covid-19 declaration under Section 564(b)(1) of the Act, 21  U.S.C. section 360bbb-3(b)(1), unless the authorization is  terminated or revoked. Performed at [facility], [place] [address] [place], [place] [address]   HIV Antibody (routine testing w rflx)     Status: None   Collection Time: 03/16/19  5:56 AM  Result Value Ref Range   HIV Screen 4th Generation wRfx NON REACTIVE NON REACTIVE    Comment: Performed at [place] [address], [place], [place] [address]  Protime-INR     Status: Abnormal   Collection Time: 03/16/19  5:56 AM  Result Value Ref Range   Prothrombin Time 46.2 (H) 11.4 - 15.2 seconds   INR 5.0 (HH) 0.8 - 1.2    Comment: CRITICAL RESULT CALLED TO, READ BACK BY AND VERIFIED WITH: SIMPSON,C. RN @0834  ON 12.15.2020 BY NMCCOY (NOTE) INR goal varies based on device and disease states. Performed at [facility], [place] [address] [place], [place] [address]   CBC     Status: Abnormal   Collection Time: 03/16/19  5:56 AM  Result Value Ref Range   WBC 12.6 (H) 4.0 - 10.5 K/uL   RBC 4.34 4.22 - 5.81 MIL/uL   Hemoglobin 13.1 13.0 - 17.0 g/dL   HCT 41.7 39.0 - 52.0 %   MCV 96.1 80.0 - 100.0 fL   MCH 30.2 26.0 - 34.0 pg   MCHC 31.4 30.0 -  36.0 g/dL   RDW 13.3 11.5 - 15.5 %   Platelets 144 (L) 150 - 400 K/uL   nRBC 0.0 0.0 - 0.2 %    Comment: Performed at [facility], [place] [address], [place], [place] [id number]  Basic metabolic panel     Status: Abnormal   Collection Time: 03/16/19  5:56 AM  Result Value Ref Range   Sodium 137 135 - 145 mmol/L   Potassium 3.4 (L) 3.5 - 5.1 mmol/L   Chloride 101 98 - 111 mmol/L   CO2 26 22 - 32 mmol/L   Glucose, Bld 120 (H) 70 - 99 mg/dL   BUN 36 (H) 8 - 23 mg/dL   Creatinine, Ser 1.09 0.61 - 1.24 mg/dL   Calcium 8.3 (L) 8.9 - 10.3 mg/dL   GFR calc non Af Amer >60 >60 mL/min   GFR calc Af Amer >60 >60 mL/min   Anion gap 10 5 - 15    Comment: Performed at [facility], [place] [address], [place], [place] [address]   CT ABDOMEN PELVIS W WO CONTRAST  Result Date: 03/15/2019 CLINICAL DATA:  Right lower quadrant pain, hematuria. EXAM: CT ABDOMEN AND PELVIS WITHOUT AND WITH CONTRAST TECHNIQUE: Multidetector CT imaging of the abdomen and pelvis was performed following the standard protocol before and following the bolus administration of intravenous contrast. CONTRAST:  169mL OMNIPAQUE IOHEXOL 300 MG/ML  SOLN COMPARISON:  No prior imaging is available for comparison. FINDINGS: Lower chest: Signs of mitral and aortic valve replacement. No signs of effusion or consolidation at the lung bases. Numerous bilateral calcified pulmonary nodules indicative of prior infection. Hepatobiliary: Liver is normal. Gallbladder is unremarkable and there is no sign of biliary ductal dilation. Pancreas: Pancreas is unremarkable without signs of pancreatic lesion, ductal distension or peripancreatic inflammation. Spleen: Spleen with signs of scarring. Adrenals/Urinary Tract: Adrenal glands  are normal. Signs of symmetric renal enhancement. The right ureter is involved with inflammation arising from the right lower quadrant and the appendix. The urinary bladder is normal. Stomach/Bowel:  Moderate hiatal hernia. No signs of frank bowel obstruction involving small bowel but there are numerous fluid-filled dilated loops without clear transition in the setting of acute ruptured appendicitis. A large appendicular lith is noted at the appendiceal orifice at the level of the cecum this measures approximately 13 mm. There is a defect in wall enhancement involving the distal appendix which is dilated to 13 mm. Surrounding this area there is fluid that measures approximately 3.1 x 1.3 cm. Vascular/Lymphatic: Calcified and noncalcified atherosclerotic plaque throughout the abdominal aorta. No signs of adenopathy. No signs of pelvic lymphadenopathy. Scattered small lymph nodes throughout the pelvis. Reproductive: Prostate is unremarkable though heterogeneous, nonspecific finding. Other: Fluid in stranding in the right lower quadrant. Process appears contained to this area largely though there is stranding throughout the right retroperitoneum along the lateral conal fascia and anterior renal fascia. No signs of free air. Trace fluid atop the urinary bladder. Signs of bilateral fat containing inguinal hernias. Musculoskeletal: Signs of sternotomy for valve replacement. No signs of acute bone finding or destructive bone process. IMPRESSION: 1. Findings are consistent with ruptured acute appendicitis with obstructing appendicolith. 2. There is a defect in wall enhancement and non peripherally enhancing adjacent fluid that measures approximately 3.1 x 1.3 cm. 3. Findings of concomitant small-bowel ileus. 4. No signs of urothelial lesion though there is some mild urothelial enhancement which is long segment, presumably due to secondary inflammation in the setting of acute appendicitis. Suggest clinical and imaging follow-up as warranted for reported hematuria. 5. Moderate hiatal hernia. 6. Aortic atherosclerosis. 7. Critical Value/emergent results were called by telephone at the time of interpretation on 03/15/2019  at 4:29 pm to [facility] , who verbally acknowledged these results. Aortic Atherosclerosis (ICD10-I70.0). Electronically Signed   By: Zetta Bills M.D.   On: 03/15/2019 16:29   DG Chest Portable 1 View  Result Date: 03/15/2019 CLINICAL DATA:  Cough.  History of ruptured appendicitis EXAM: PORTABLE CHEST 1 VIEW COMPARISON:  CT of earlier today FINDINGS: Prior median sternotomy. Apical lordotic positioning. Midline trachea. Mild cardiomegaly. Atherosclerosis in the transverse aorta. No pleural effusion or pneumothorax. Clear lungs. IMPRESSION: No acute cardiopulmonary disease. Aortic Atherosclerosis (ICD10-I70.0). Electronically Signed   By: Abigail Miyamoto M.D.   On: 03/15/2019 18:11    Pending Labs Unresulted Labs (From admission, onward)    Start     Ordered   03/17/19 [id number]  Basic metabolic panel  Tomorrow morning,   R     03/16/19 1124   03/17/19 0500  Magnesium  Tomorrow morning,   R     03/16/19 1124   03/17/19 0500  AM BMP  Daily,   R    Question:  Specimen collection method  Answer:  Lab=Lab collect   03/16/19 1952   03/17/19 0500  AM CBC  Daily,   R    Question:  Specimen collection method  Answer:  Lab=Lab collect   03/16/19 1952   03/17/19 0500  Protime-INR  Daily,   R    Question:  Specimen collection method  Answer:  Lab=Lab collect   03/16/19 1952   03/16/19 1042  Culture, Urine  Once,   STAT     03/16/19 1041          Vitals/Pain Today's Vitals   03/16/19 1700 03/16/19 1730 03/16/19 2144 03/16/19 2148  BP: (!) 143/70 (!) 151/64 132/68   Pulse: 78 78 80   Resp: (!) 31 (!) 27 20   Temp:   99.5 F (37.5 C)   TempSrc:   Oral   SpO2: 100% 96% 97%   PainSc:    0-No pain    Isolation Precautions No active isolations  Medications Medications  0.9 %  sodium chloride infusion ( Intravenous Not Given 03/16/19 1448)  acetaminophen (TYLENOL) tablet 650 mg (has no administration in time range)    Or  acetaminophen (TYLENOL) suppository 650 mg (has no  administration in time range)  ondansetron (ZOFRAN) tablet 4 mg (has no administration in time range)    Or  ondansetron (ZOFRAN) injection 4 mg (has no administration in time range)  albuterol (VENTOLIN HFA) 108 (90 Base) MCG/ACT inhaler 1-2 puff (has no administration in time range)  morphine 2 MG/ML injection 2 mg (has no administration in time range)  metoprolol tartrate (LOPRESSOR) injection 2.5 mg (has no administration in time range)  piperacillin-tazobactam (ZOSYN) IVPB 3.375 g (0 g Intravenous Stopped 03/16/19 2146)  piperacillin-tazobactam (ZOSYN) IVPB 3.375 g (0 g Intravenous Stopped 03/15/19 1908)  sodium chloride 0.9 % bolus 500 mL (0 mLs Intravenous Stopped 03/15/19 2050)  ondansetron (ZOFRAN) injection 4 mg (4 mg Intravenous Given 03/15/19 2050)  morphine 4 MG/ML injection 4 mg (4 mg Intravenous Given 03/15/19 2050)  potassium chloride 10 mEq in 100 mL IVPB (0 mEq Intravenous Stopped 03/16/19 1527)  phytonadione (VITAMIN K) 1 mg in dextrose 5 % 50 mL IVPB (0 mg Intravenous Stopped 03/16/19 1215)    Mobility walks     Focused Assessments    R Recommendations: See Admitting Provider Note  Report given to:   Additional Notes:

## 2019-03-16 NOTE — ED Notes (Signed)
Tranfered to room 31 TCU

## 2019-03-16 NOTE — Progress Notes (Signed)
Request to IR for possible percutaneous drain placement - imaging reviewed by Dr. Kathlene Cote today who does not feel that the fluid collection is amenable to percutaneous drainage at this time due it's very small size. Recommend continued medical management and if patient does not improve consider repeat CT to reassess need for drain placement.  This information was relayed to Margie Billet, PA-C today by this Probation officer.   Order will be cancelled - please place a new order if it is felt this patient would benefit from reassessment in the future.  Please call IR with questions or concerns.  Candiss Norse, PA-C

## 2019-03-16 NOTE — ED Notes (Signed)
This RN spoke with pharmacy to change the order times for K admin. Previous RN un able to give due to Vit K and Zosyn infusing. Pharmacy sent two bags of K up and this RN returned the extra bag

## 2019-03-16 NOTE — ED Notes (Signed)
CRITICAL VALUE STICKER  CRITICAL VALUE: INR 5.0  RECEIVER (on-site recipient of call): Epifanio Lesches, RN  DATE & TIME NOTIFIED: 03/16/2019 [phone]  MESSENGER (representative from lab): Joya San  MD NOTIFIED: Lodema Hong - text paged  TIME OF NOTIFICATION: [phone]  RESPONSE: See orders

## 2019-03-16 NOTE — Progress Notes (Signed)
PROGRESS NOTE    [name]    Code Status: Full Code  [id number] DOB: 07/31/1955 DOA: 03/15/2019  PCP: [name], MD    Hospital Summary  Is a 63 year old male past medical history of CAD status post STEMI and DES, severe AS and MV insufficiency status post mechanical AVR and mechanical MV replacement on Coumadin, status post atrial appendage ligation and maze procedure in 2013, paroxysmal atrial fibrillation, COPD, hypertension and hyperlipidemia who presented to the ED for evaluation of appendicitis and sent in by PCP.  Found to have acute appendicitis with perforation and early abscess formation on CT scan.  Discussed with general surgery and IR.  Per IR fluid collection is too small to drain and recommended continuing medical management and consider repeat scan if not improving.  Patient found to have supratherapeutic INR of 5.0 on 12/15 and was given low-dose IV vitamin K though he was not bleeding he was n.p.o. at the time.  Continuing further medical management pending surgery input in a.m. and INR follow-up.  A & P   Principal Problem:   Acute appendicitis with rupture Active Problems:   Coronary artery disease involving native coronary artery of native heart without angina pectoris   H/O mechanical aortic valve replacement   H/O mitral valve replacement with mechanical valve   Paroxysmal atrial fibrillation (HCC)   Essential hypertension   COPD (chronic obstructive pulmonary disease) (HCC)   Hyperlipidemia   Acute appendicitis with perforation and early abscess formation, appendicolith Hemodynamically stable on room air, leukocytosis improved.  CT scan 12/14 showed acute appendicitis with obstructing appendicolith and adjacent fluid collection 3.1 x 1.3 cm; ileus.  INR 5.0 and not bleeding but patient was n.p.o. this a.m. and was given low-dose IV vitamin K -General surgery consulted: No role for acute surgical intervention, per IR, fluid collection too small to  drain and recommending medical management, consider repeat CT scan if patient is not improving. -Continue IV Zosyn -Continue IV fluid hydration -Continue IV morphine as needed for pain -Diet per surgery -Hold Coumadin and aspirin and repeat INR in a.m. -Follow-up INR  Small bowel ileus: -Diet per surgery, continue IV fluids, pain control, and antiemetics as above  S/p mechanical aortic and mitral valve replacement with supratherapeutic INR On Coumadin outpatient.  Goal INR would be between 2.5-3.5.  INR on arrival was 3.6-> 5.0 not bleeding but patient was n.p.o. this a.m. for possible surgery and was given low-dose IV vitamin K -Follow-up INR in a.m. -Will need to start IV heparin bridge if INR drops below 2.5.  Paroxysmal atrial fibrillation: In sinus rhythm at time of admission.  Holding Coumadin as above.  Holding Coreg while NPO.  Will use IV metoprolol only as needed for elevated heart rate.  CAD s/p inferior STEMI s/p DES to RCA 10/2017: Chronic and stable, denies any chest pain.  EKG without acute ischemic changes.  Holding aspirin, Coreg, atorvastatin while NPO.  COPD: Not on maintenance therapy yet as an outpatient.  Has coarse wheezing on admission.  He is oxygenating well on room air. -Albuterol as needed for wheezing and shortness of breath  Hypertension: Currently normotensive.  Holding home enalapril and Coreg for now.  Hyperlipidemia: Holding atorvastatin as above.  Tobacco use: Reports smoking half pack per day.  Has not smoked for the last 4 days and does not plan to restart.  DVT prophylaxis: Supratherapeutic INR Diet: N.p.o. except for ice chips and sips with meds Family Communication: No family at bedside Disposition Plan:  Pending clinical stability  Consultants  Neurosurgery IR  Procedures  None  Antibiotics   Anti-infectives (From admission, onward)   Start     Dose/Rate Route Frequency Ordered Stop   03/16/19 0000   piperacillin-tazobactam (ZOSYN) IVPB 3.375 g     3.375 g 12.5 mL/hr over 240 Minutes Intravenous Every 8 hours 03/15/19 2110     03/15/19 1730  piperacillin-tazobactam (ZOSYN) IVPB 3.375 g     3.375 g 100 mL/hr over 30 Minutes Intravenous  Once 03/15/19 1728 03/15/19 1908           Subjective   Patient seen and examined at bedside in no acute distress and resting comfortably. No acute events overnight. Denies any acute complaints at this time.  Not short of breath.  Objective   Vitals:   03/16/19 1600 03/16/19 1630 03/16/19 1700 03/16/19 1730  BP: 135/75 135/62 (!) 143/70 (!) 151/64  Pulse: 75 78 78 78  Resp: (!) 27 (!) 23 (!) 31 (!) 27  Temp:      TempSrc:      SpO2: 99% 97% 100% 96%    Intake/Output Summary (Last 24 hours) at 03/16/2019 1940 Last data filed at 03/16/2019 1527 Gross per 24 hour  Intake 2062.5 ml  Output --  Net 2062.5 ml   There were no vitals filed for this visit.  Examination:  Physical Exam Vitals and nursing note reviewed.  Constitutional:      Appearance: Normal appearance.  HENT:     Head: Normocephalic and atraumatic.     Nose: Nose normal.     Mouth/Throat:     Mouth: Mucous membranes are moist.  Eyes:     Extraocular Movements: Extraocular movements intact.  Cardiovascular:     Rate and Rhythm: Normal rate and regular rhythm.  Pulmonary:     Effort: Pulmonary effort is normal.     Breath sounds: Wheezing present.     Comments: Diffuse wheezes  on room air and comfortable Abdominal:     General: Abdomen is protuberant. Bowel sounds are normal.  Musculoskeletal:        General: No swelling. Normal range of motion.     Cervical back: Normal range of motion. No rigidity.  Neurological:     General: No focal deficit present.     Mental Status: He is alert. Mental status is at baseline.  Psychiatric:        Mood and Affect: Mood normal.        Behavior: Behavior normal.     Data Reviewed: I have personally reviewed following  labs and imaging studies  CBC: Recent Labs  Lab 03/15/19 1744 03/16/19 0556  WBC 14.6* 12.6*  NEUTROABS 12.5*  --   HGB 15.4 13.1  HCT 47.0 41.7  MCV 93.3 96.1  PLT 157 123456*   Basic Metabolic Panel: Recent Labs  Lab 03/15/19 1529 03/15/19 1744 03/16/19 0556  NA  --  137 137  K  --  3.6 3.4*  CL  --  100 101  CO2  --  27 26  GLUCOSE  --  125* 120*  BUN  --  44* 36*  CREATININE 1.70* 1.39* 1.09  CALCIUM  --  8.9 8.3*   GFR: Estimated Creatinine Clearance: 75 mL/min (by C-G formula based on SCr of 1.09 mg/dL). Liver Function Tests: Recent Labs  Lab 03/15/19 1744  AST 32  ALT 22  ALKPHOS 75  BILITOT 1.6*  PROT 7.0  ALBUMIN 4.0   No results for input(s):  LIPASE, AMYLASE in the last 168 hours. No results for input(s): AMMONIA in the last 168 hours. Coagulation Profile: Recent Labs  Lab 03/15/19 1744 03/16/19 0556  INR 3.6* 5.0*   Cardiac Enzymes: No results for input(s): CKTOTAL, CKMB, CKMBINDEX, TROPONINI in the last 168 hours. BNP (last 3 results) No results for input(s): PROBNP in the last 8760 hours. HbA1C: No results for input(s): HGBA1C in the last 72 hours. CBG: No results for input(s): GLUCAP in the last 168 hours. Lipid Profile: No results for input(s): CHOL, HDL, LDLCALC, TRIG, CHOLHDL, LDLDIRECT in the last 72 hours. Thyroid Function Tests: No results for input(s): TSH, T4TOTAL, FREET4, T3FREE, THYROIDAB in the last 72 hours. Anemia Panel: No results for input(s): VITAMINB12, FOLATE, FERRITIN, TIBC, IRON, RETICCTPCT in the last 72 hours. Sepsis Labs: No results for input(s): PROCALCITON, LATICACIDVEN in the last 168 hours.  Recent Results (from the past 240 hour(s))  Respiratory Panel by RT PCR (Flu A&B, Covid) - Nasopharyngeal Swab     Status: None   Collection Time: 03/15/19  5:44 PM   Specimen: Nasopharyngeal Swab  Result Value Ref Range Status   SARS Coronavirus 2 by RT PCR NEGATIVE NEGATIVE Final    Comment: (NOTE) SARS-CoV-2 target  nucleic acids are NOT DETECTED. The SARS-CoV-2 RNA is generally detectable in upper respiratoy specimens during the acute phase of infection. The lowest concentration of SARS-CoV-2 viral copies this assay can detect is 131 copies/mL. A negative result does not preclude SARS-Cov-2 infection and should not be used as the sole basis for treatment or other patient management decisions. A negative result may occur with  improper specimen collection/handling, submission of specimen other than nasopharyngeal swab, presence of viral mutation(s) within the areas targeted by this assay, and inadequate number of viral copies (<131 copies/mL). A negative result must be combined with clinical observations, patient history, and epidemiological information. The expected result is Negative. Fact Sheet for Patients:  PinkCheek.be Fact Sheet for Healthcare Providers:  GravelBags.it This test is not yet ap proved or cleared by the [place] FDA and  has been authorized for detection and/or diagnosis of SARS-CoV-2 by FDA under an Emergency Use Authorization (EUA). This EUA will remain  in effect (meaning this test can be used) for the duration of the COVID-19 declaration under Section 564(b)(1) of the Act, 21 U.S.C. section 360bbb-3(b)(1), unless the authorization is terminated or revoked sooner.    Influenza A by PCR NEGATIVE NEGATIVE Final   Influenza B by PCR NEGATIVE NEGATIVE Final    Comment: (NOTE) The Xpert Xpress SARS-CoV-2/FLU/RSV assay is intended as an aid in  the diagnosis of influenza from Nasopharyngeal swab specimens and  should not be used as a sole basis for treatment. Nasal washings and  aspirates are unacceptable for Xpert Xpress SARS-CoV-2/FLU/RSV  testing. Fact Sheet for Patients: PinkCheek.be Fact Sheet for Healthcare Providers: GravelBags.it This test is not  yet approved or cleared by the [place] FDA and  has been authorized for detection and/or diagnosis of SARS-CoV-2 by  FDA under an Emergency Use Authorization (EUA). This EUA will remain  in effect (meaning this test can be used) for the duration of the  Covid-19 declaration under Section 564(b)(1) of the Act, 21  U.S.C. section 360bbb-3(b)(1), unless the authorization is  terminated or revoked. Performed at [facility], [place] [address] [place], [place] [address]          Radiology Studies: CT ABDOMEN PELVIS W WO CONTRAST  Result Date: 03/15/2019 CLINICAL DATA:  Right lower quadrant pain, hematuria. EXAM: CT ABDOMEN AND PELVIS WITHOUT AND WITH CONTRAST TECHNIQUE: Multidetector CT imaging of the abdomen and pelvis was performed following the standard protocol before and following the bolus administration of intravenous contrast. CONTRAST:  165mL OMNIPAQUE IOHEXOL 300 MG/ML  SOLN COMPARISON:  No prior imaging is available for comparison. FINDINGS: Lower chest: Signs of mitral and aortic valve replacement. No signs of effusion or consolidation at the lung bases. Numerous bilateral calcified pulmonary nodules indicative of prior infection. Hepatobiliary: Liver is normal. Gallbladder is unremarkable and there is no sign of biliary ductal dilation. Pancreas: Pancreas is unremarkable without signs of pancreatic lesion, ductal distension or peripancreatic inflammation. Spleen: Spleen with signs of scarring. Adrenals/Urinary Tract: Adrenal glands are normal. Signs of symmetric renal enhancement. The right ureter is involved with inflammation arising from the right lower quadrant and the appendix. The urinary bladder is normal. Stomach/Bowel: Moderate hiatal hernia. No signs of frank bowel obstruction involving small bowel but there are numerous fluid-filled dilated loops without clear transition in the setting of acute ruptured appendicitis. A large appendicular lith is noted at  the appendiceal orifice at the level of the cecum this measures approximately 13 mm. There is a defect in wall enhancement involving the distal appendix which is dilated to 13 mm. Surrounding this area there is fluid that measures approximately 3.1 x 1.3 cm. Vascular/Lymphatic: Calcified and noncalcified atherosclerotic plaque throughout the abdominal aorta. No signs of adenopathy. No signs of pelvic lymphadenopathy. Scattered small lymph nodes throughout the pelvis. Reproductive: Prostate is unremarkable though heterogeneous, nonspecific finding. Other: Fluid in stranding in the right lower quadrant. Process appears contained to this area largely though there is stranding throughout the right retroperitoneum along the lateral conal fascia and anterior renal fascia. No signs of free air. Trace fluid atop the urinary bladder. Signs of bilateral fat containing inguinal hernias. Musculoskeletal: Signs of sternotomy for valve replacement. No signs of acute bone finding or destructive bone process. IMPRESSION: 1. Findings are consistent with ruptured acute appendicitis with obstructing appendicolith. 2. There is a defect in wall enhancement and non peripherally enhancing adjacent fluid that measures approximately 3.1 x 1.3 cm. 3. Findings of concomitant small-bowel ileus. 4. No signs of urothelial lesion though there is some mild urothelial enhancement which is long segment, presumably due to secondary inflammation in the setting of acute appendicitis. Suggest clinical and imaging follow-up as warranted for reported hematuria. 5. Moderate hiatal hernia. 6. Aortic atherosclerosis. 7. Critical Value/emergent results were called by telephone at the time of interpretation on 03/15/2019 at 4:29 pm to [facility] , who verbally acknowledged these results. Aortic Atherosclerosis (ICD10-I70.0). Electronically Signed   By: Zetta Bills M.D.   On: 03/15/2019 16:29   DG Chest Portable 1 View  Result Date:  03/15/2019 CLINICAL DATA:  Cough.  History of ruptured appendicitis EXAM: PORTABLE CHEST 1 VIEW COMPARISON:  CT of earlier today FINDINGS: Prior median sternotomy. Apical lordotic positioning. Midline trachea. Mild cardiomegaly. Atherosclerosis in the transverse aorta. No pleural effusion or pneumothorax. Clear lungs. IMPRESSION: No acute cardiopulmonary disease. Aortic Atherosclerosis (ICD10-I70.0). Electronically Signed   By: Abigail Miyamoto M.D.   On: 03/15/2019 18:11        Scheduled Meds:  albuterol  2.5 mg Nebulization Once   Continuous Infusions:  piperacillin-tazobactam (ZOSYN)  IV 3.375 g (03/16/19 1554)     LOS: 1 day    Time spent: 20 minutes with over 50% of the time coordinating the patient's care    Kevan Ny E  Neysa Bonito, DO [facility] Pager [phone]  If 7PM-7AM, please contact night-coverage [email or web address] Password [email or web address] 03/16/2019, 7:40 PM

## 2019-03-17 DIAGNOSIS — J410 Simple chronic bronchitis: Secondary | ICD-10-CM

## 2019-03-17 DIAGNOSIS — K3532 Acute appendicitis with perforation and localized peritonitis, without abscess: Secondary | ICD-10-CM

## 2019-03-17 DIAGNOSIS — K3533 Acute appendicitis with perforation and localized peritonitis, with abscess: Principal | ICD-10-CM

## 2019-03-17 LAB — HEPARIN LEVEL (UNFRACTIONATED): Heparin Unfractionated: <0.1 IU/mL — ABNORMAL LOW (ref 0.30–0.70)

## 2019-03-17 LAB — BASIC METABOLIC PANEL
Anion gap: 10 (ref 5–15)
BUN: 24 mg/dL — ABNORMAL HIGH (ref 8–23)
CO2: 25 mmol/L (ref 22–32)
Calcium: 8.4 mg/dL — ABNORMAL LOW (ref 8.9–10.3)
Chloride: 105 mmol/L (ref 98–111)
Creatinine, Ser: 0.9 mg/dL (ref 0.61–1.24)
GFR calc Af Amer: >60 mL/min (ref >60)
GFR calc non Af Amer: >60 mL/min (ref >60)
Glucose, Bld: 97 mg/dL (ref 70–99)
Potassium: 3.7 mmol/L (ref 3.5–5.1)
Sodium: 140 mmol/L (ref 135–145)

## 2019-03-17 LAB — CBC
HCT: 41.8 % (ref 39.0–52.0)
Hemoglobin: 13.1 g/dL (ref 13.0–17.0)
MCH: 30.4 pg (ref 26.0–34.0)
MCHC: 31.3 g/dL (ref 30.0–36.0)
MCV: 97 fL (ref 80.0–100.0)
Platelets: 128 10*3/uL — ABNORMAL LOW (ref 150–400)
RBC: 4.31 MIL/uL (ref 4.22–5.81)
RDW: 13.2 % (ref 11.5–15.5)
WBC: 9.4 10*3/uL (ref 4.0–10.5)
nRBC: 0 % (ref 0.0–0.2)

## 2019-03-17 LAB — URINE CULTURE: Culture: NO GROWTH

## 2019-03-17 LAB — MAGNESIUM: Magnesium: 2.2 mg/dL (ref 1.7–2.4)

## 2019-03-17 LAB — PROTIME-INR
INR: 1.4 — ABNORMAL HIGH (ref 0.8–1.2)
Prothrombin Time: 16.5 seconds — ABNORMAL HIGH (ref 11.4–15.2)

## 2019-03-17 MED ORDER — HEPARIN BOLUS VIA INFUSION
3000.0000 [IU] | Freq: Once | INTRAVENOUS | Status: AC
  Administered 2019-03-17: 3000 [IU] via INTRAVENOUS
  Filled 2019-03-17: qty 3000

## 2019-03-17 MED ORDER — HEPARIN (PORCINE) 25000 UT/250ML-% IV SOLN
1600.0000 [IU]/h | INTRAVENOUS | Status: DC
  Administered 2019-03-17 – 2019-03-18 (×2): 1550 [IU]/h via INTRAVENOUS
  Administered 2019-03-19: 1600 [IU]/h via INTRAVENOUS
  Filled 2019-03-17 (×6): qty 250

## 2019-03-17 MED ORDER — HEPARIN (PORCINE) 25000 UT/250ML-% IV SOLN
1250.0000 [IU]/h | INTRAVENOUS | Status: DC
  Administered 2019-03-17: 1250 [IU]/h via INTRAVENOUS
  Filled 2019-03-17: qty 250

## 2019-03-17 MED ORDER — HEPARIN BOLUS VIA INFUSION
1500.0000 [IU] | Freq: Once | INTRAVENOUS | Status: AC
  Administered 2019-03-17: 1500 [IU] via INTRAVENOUS
  Filled 2019-03-17: qty 1500

## 2019-03-17 NOTE — Progress Notes (Signed)
Patient ID: [name], male   DOB: 1955/04/04, 63 y.o.   MRN: [id number]       Subjective: Says he has no pain when he sits still and doesn't move, but states his pain is much better than on admit yesterday.  No nausea.  Feels bloated today.  Passing some flatus.  ROS: See above, otherwise other systems negative  Objective: Vital signs in last 24 hours: Temp:  [97.7 F (36.5 C)-99.5 F (37.5 C)] 97.7 F (36.5 C) (12/16 0517) Pulse Rate:  [69-80] 69 (12/16 0517) Resp:  [18-31] 18 (12/16 0517) BP: (117-151)/(57-75) 117/57 (12/16 0517) SpO2:  [96 %-100 %] 98 % (12/16 0517) Weight:  [89.4 kg] 89.4 kg (12/15 2344) Last BM Date: 03/12/19  Intake/Output from previous day: 12/15 0701 - 12/16 0700 In: 2019.8 [P.O.:30; I.V.:1659; IV Piggyback:330.8] Out: 0  Intake/Output this shift: No intake/output data recorded.  PE: Gen: NAD Heart: regular, mechanical click noted Lungs: audible wheezing, also noted with auscultation Abd: soft, mild bloating, tender in RUQ and RLQ with some mild guarding, but no peritonitis.  Lab Results:  Recent Labs    03/16/19 0556 03/17/19 0455  WBC 12.6* 9.4  HGB 13.1 13.1  HCT 41.7 41.8  PLT 144* 128*   BMET Recent Labs    03/16/19 0556 03/17/19 0455  NA 137 140  K 3.4* 3.7  CL 101 105  CO2 26 25  GLUCOSE 120* 97  BUN 36* 24*  CREATININE 1.09 0.90  CALCIUM 8.3* 8.4*   PT/INR Recent Labs    03/16/19 0556 03/17/19 0455  LABPROT 46.2* 16.5*  INR 5.0* 1.4*   CMP     Component Value Date/Time   NA 140 03/17/2019 0455   K 3.7 03/17/2019 0455   CL 105 03/17/2019 0455   CO2 25 03/17/2019 0455   GLUCOSE 97 03/17/2019 0455   BUN 24 (H) 03/17/2019 0455   CREATININE 0.90 03/17/2019 0455   CALCIUM 8.4 (L) 03/17/2019 0455   PROT 7.0 03/15/2019 1744   ALBUMIN 4.0 03/15/2019 1744   AST 32 03/15/2019 1744   ALT 22 03/15/2019 1744   ALKPHOS 75 03/15/2019 1744   BILITOT 1.6 (H) 03/15/2019 1744   GFRNONAA >60 03/17/2019 0455   GFRAA  >60 03/17/2019 0455   Lipase     Component Value Date/Time   LIPASE 39 02/03/2018 1216       Studies/Results: CT ABDOMEN PELVIS W WO CONTRAST  Result Date: 03/15/2019 CLINICAL DATA:  Right lower quadrant pain, hematuria. EXAM: CT ABDOMEN AND PELVIS WITHOUT AND WITH CONTRAST TECHNIQUE: Multidetector CT imaging of the abdomen and pelvis was performed following the standard protocol before and following the bolus administration of intravenous contrast. CONTRAST:  173mL OMNIPAQUE IOHEXOL 300 MG/ML  SOLN COMPARISON:  No prior imaging is available for comparison. FINDINGS: Lower chest: Signs of mitral and aortic valve replacement. No signs of effusion or consolidation at the lung bases. Numerous bilateral calcified pulmonary nodules indicative of prior infection. Hepatobiliary: Liver is normal. Gallbladder is unremarkable and there is no sign of biliary ductal dilation. Pancreas: Pancreas is unremarkable without signs of pancreatic lesion, ductal distension or peripancreatic inflammation. Spleen: Spleen with signs of scarring. Adrenals/Urinary Tract: Adrenal glands are normal. Signs of symmetric renal enhancement. The right ureter is involved with inflammation arising from the right lower quadrant and the appendix. The urinary bladder is normal. Stomach/Bowel: Moderate hiatal hernia. No signs of frank bowel obstruction involving small bowel but there are numerous fluid-filled dilated loops without clear transition in  the setting of acute ruptured appendicitis. A large appendicular lith is noted at the appendiceal orifice at the level of the cecum this measures approximately 13 mm. There is a defect in wall enhancement involving the distal appendix which is dilated to 13 mm. Surrounding this area there is fluid that measures approximately 3.1 x 1.3 cm. Vascular/Lymphatic: Calcified and noncalcified atherosclerotic plaque throughout the abdominal aorta. No signs of adenopathy. No signs of pelvic  lymphadenopathy. Scattered small lymph nodes throughout the pelvis. Reproductive: Prostate is unremarkable though heterogeneous, nonspecific finding. Other: Fluid in stranding in the right lower quadrant. Process appears contained to this area largely though there is stranding throughout the right retroperitoneum along the lateral conal fascia and anterior renal fascia. No signs of free air. Trace fluid atop the urinary bladder. Signs of bilateral fat containing inguinal hernias. Musculoskeletal: Signs of sternotomy for valve replacement. No signs of acute bone finding or destructive bone process. IMPRESSION: 1. Findings are consistent with ruptured acute appendicitis with obstructing appendicolith. 2. There is a defect in wall enhancement and non peripherally enhancing adjacent fluid that measures approximately 3.1 x 1.3 cm. 3. Findings of concomitant small-bowel ileus. 4. No signs of urothelial lesion though there is some mild urothelial enhancement which is long segment, presumably due to secondary inflammation in the setting of acute appendicitis. Suggest clinical and imaging follow-up as warranted for reported hematuria. 5. Moderate hiatal hernia. 6. Aortic atherosclerosis. 7. Critical Value/emergent results were called by telephone at the time of interpretation on 03/15/2019 at 4:29 pm to [facility] , who verbally acknowledged these results. Aortic Atherosclerosis (ICD10-I70.0). Electronically Signed   By: Zetta Bills M.D.   On: 03/15/2019 16:29   DG Chest Portable 1 View  Result Date: 03/15/2019 CLINICAL DATA:  Cough.  History of ruptured appendicitis EXAM: PORTABLE CHEST 1 VIEW COMPARISON:  CT of earlier today FINDINGS: Prior median sternotomy. Apical lordotic positioning. Midline trachea. Mild cardiomegaly. Atherosclerosis in the transverse aorta. No pleural effusion or pneumothorax. Clear lungs. IMPRESSION: No acute cardiopulmonary disease. Aortic Atherosclerosis (ICD10-I70.0).  Electronically Signed   By: Abigail Miyamoto M.D.   On: 03/15/2019 18:11    Anti-infectives: Anti-infectives (From admission, onward)   Start     Dose/Rate Route Frequency Ordered Stop   03/16/19 0000  piperacillin-tazobactam (ZOSYN) IVPB 3.375 g     3.375 g 12.5 mL/hr over 240 Minutes Intravenous Every 8 hours 03/15/19 2110     03/15/19 1730  piperacillin-tazobactam (ZOSYN) IVPB 3.375 g     3.375 g 100 mL/hr over 30 Minutes Intravenous  Once 03/15/19 1728 03/15/19 1908       Assessment/Plan S/p mechanical aortic and mitral valve replacement  On chronic anticoagulation - coumadin on hold, INR 1.4.  Per medicine, likely plan for heparin bridge CAD, STEMI s/p DES to RCA 10/2017 HTN COPD HLD Tobacco abuse  Acute appendicitis with perforation and early abscess formation, appendicolith - IR unable to drain fluid collection due to size -cont IV abx therapy -still tender and would keep NPO x ice chips and sips from floor.  If continues to improve, can hopefully have clears tomorrow.  Is also feeling more bloated today -mobilize and pulm toilet -once again had a long discussion with the patient explaining why it is safest right now to avoid surgery and try to get to an interval appendectomy as reasons discussed by [name] in his note last night.  We also discussed tentative plans moving forward such as need for no repeat scan vs repeat scan  and what determines that.  He seems to have a better understanding of these things today.  ID - zosyn 12/14>> FEN - IVF, NPO, x ice and sips of clears from floor VTE - SCDs, coumadin on hold, likely to start heparin bridge today, per medicine Foley - none Follow up - TBD   LOS: 2 days    Henreitta Cea , [facility] 03/17/2019, 10:43 AM Please see Amion for pager number during day hours 7:00am-4:30pm

## 2019-03-17 NOTE — Progress Notes (Signed)
[facility]                                                                              Patient Demographics  [name], is a 63 y.o. male, DOB - [birth year], LC:[id number]  Admit date - 03/15/2019   Admitting Physician Lenore Cordia, MD  Outpatient Primary MD for the patient is [name], MD  Outpatient specialists:   LOS - 2  days   Medical records reviewed and are as summarized below:    Chief Complaint  Patient presents with   Abdominal Pain       Brief summary   Patient is a 63 year old male past medical history of CAD status post STEMI and DES, severe AS and MV insufficiency status post mechanical AVR and mechanical MV replacement on Coumadin, status post atrial appendage ligation and maze procedure in 2013, paroxysmal atrial fibrillation, COPD, hypertension and hyperlipidemia who presented to the ED for evaluation of appendicitis and sent in by PCP.  Found to have acute appendicitis with perforation and early abscess formation on CT scan.  Discussed with general surgery and IR.  Per IR fluid collection is too small to drain and recommended continuing medical management and consider repeat scan if not improving.  Patient found to have supratherapeutic INR of 5.0 on 12/15 and was given low-dose IV vitamin K though he was not bleeding he was n.p.o. at the time.    Assessment & Plan    Principal Problem:   Acute appendicitis with rupture, early abscess formation, appendicolith -Presented with abdominal pain.  CT scan showedacute appendicitis with obstructing appendicolithandadjacent fluidcollection3.1 x 1.3 cm; ileus. -General surgery was consulted, recommended IR evaluation for possible percutaneous drain -Per IR, fluid collection too small to drain and recommended IV antibiotics and medical management, repeat CT scan if patient is not improving -Continue n.p.o. status, IV fluids, IV antibiotics, pain control -General surgery following  for potential surgical intervention if not improving    Active Problems: Small bowel ileus -Continue n.p.o. status, pain control, IV fluids, sips of clears per surgery  Status post mechanical aortic and mitral valve -Patient presented with supratherapeutic INR, 5.0, was reversed -INR today 1.4, started on IV heparin drip  Paroxysmal A. Fib -Currently normal sinus rhythm, hold Coumadin -Continue IV heparin drip, heart rate controlled  CAD status post inferior STEMI status post DES to RCA 10/2017 -Currently no chest pain or acute shortness of breath, EKG with no acute ischemic changes -Continue to hold aspirin, Coreg, Lipitor while n.p.o. -Continue heparin drip  History of COPD -Currently no acute wheezing -Continue nebs as needed  Essential hypertension -BP currently stable, holding oral antihypertensives for now   Hyperlipidemia -Holding Lipitor, n.p.o.   History of tobacco use -Reports smoking half pack per day, counseled on smoking cessation   Code Status: Full CODE STATUS DVT Prophylaxis: Heparin drip Family Communication: Discussed all imaging results, lab results, explained to the patient    Disposition Plan: Once cleared from surgery  Time Spent in minutes   25 minutes  Procedures:  CT abdomen  Consultants:   Interventional radiology General surgery  Antimicrobials:   Anti-infectives (From  admission, onward)   Start     Dose/Rate Route Frequency Ordered Stop   03/16/19 0000  piperacillin-tazobactam (ZOSYN) IVPB 3.375 g     3.375 g 12.5 mL/hr over 240 Minutes Intravenous Every 8 hours 03/15/19 2110     03/15/19 1730  piperacillin-tazobactam (ZOSYN) IVPB 3.375 g     3.375 g 100 mL/hr over 30 Minutes Intravenous  Once 03/15/19 1728 03/15/19 1908          Medications  Scheduled Meds:  heparin  3,000 Units Intravenous Once   Continuous Infusions:  sodium chloride 75 mL/hr at 03/16/19 2342   heparin     piperacillin-tazobactam  (ZOSYN)  IV 3.375 g (03/17/19 0859)   PRN Meds:.acetaminophen **OR** acetaminophen, albuterol, metoprolol tartrate, morphine injection, ondansetron **OR** ondansetron (ZOFRAN) IV      Subjective:   [name] was seen and examined today.  Still having right lower abdominal pain, periumbilical area but better from yesterday.  No nausea or vomiting.  Feels bloated.  No chest pain or shortness of breath.  No fevers.  Objective:   Vitals:   03/16/19 2228 03/16/19 2344 03/17/19 0517 03/17/19 1351  BP: 134/70  (!) 117/57 135/66  Pulse: 77  69 70  Resp: (!) 22  18 16   Temp: 98.2 F (36.8 C)  97.7 F (36.5 C) 97.9 F (36.6 C)  TempSrc: Oral  Oral Oral  SpO2: 98%  98%   Weight:  89.4 kg    Height:  5\' 9"  (1.753 m)      Intake/Output Summary (Last 24 hours) at 03/17/2019 1510 Last data filed at 03/17/2019 0600 Gross per 24 hour  Intake 607.31 ml  Output 0 ml  Net 607.31 ml     Wt Readings from Last 3 Encounters:  03/16/19 89.4 kg  03/15/19 88.5 kg  04/24/18 92.3 kg     Exam  General: Alert and oriented x 3, NAD  Eyes:   HEENT:  Atraumatic, normocephalic, normal oropharynx  Cardiovascular: S1 S2 auscultated, no murmurs, RRR  Respiratory: Clear to auscultation bilaterally, no wheezing, rales or rhonchi  Gastrointestinal: Soft, tender in right lower quadrant, RUQ, periumbilical area, no peritonitis, mild distention  Ext: no pedal edema bilaterally  Neuro: No new deficits   musculoskeletal: No digital cyanosis, clubbing  Skin: No rashes  Psych: Normal affect and demeanor, alert and oriented x3    Data Reviewed:  I have personally reviewed following labs and imaging studies  Micro Results Recent Results (from the past 240 hour(s))  Respiratory Panel by RT PCR (Flu A&B, Covid) - Nasopharyngeal Swab     Status: None   Collection Time: 03/15/19  5:44 PM   Specimen: Nasopharyngeal Swab  Result Value Ref Range Status   SARS Coronavirus 2 by RT PCR NEGATIVE  NEGATIVE Final    Comment: (NOTE) SARS-CoV-2 target nucleic acids are NOT DETECTED. The SARS-CoV-2 RNA is generally detectable in upper respiratoy specimens during the acute phase of infection. The lowest concentration of SARS-CoV-2 viral copies this assay can detect is 131 copies/mL. A negative result does not preclude SARS-Cov-2 infection and should not be used as the sole basis for treatment or other patient management decisions. A negative result may occur with  improper specimen collection/handling, submission of specimen other than nasopharyngeal swab, presence of viral mutation(s) within the areas targeted by this assay, and inadequate number of viral copies (<131 copies/mL). A negative result must be combined with clinical observations, patient history, and epidemiological information. The expected result is Negative. Fact  Sheet for Patients:  PinkCheek.be Fact Sheet for Healthcare Providers:  GravelBags.it This test is not yet ap proved or cleared by the [place] FDA and  has been authorized for detection and/or diagnosis of SARS-CoV-2 by FDA under an Emergency Use Authorization (EUA). This EUA will remain  in effect (meaning this test can be used) for the duration of the COVID-19 declaration under Section 564(b)(1) of the Act, 21 U.S.C. section 360bbb-3(b)(1), unless the authorization is terminated or revoked sooner.    Influenza A by PCR NEGATIVE NEGATIVE Final   Influenza B by PCR NEGATIVE NEGATIVE Final    Comment: (NOTE) The Xpert Xpress SARS-CoV-2/FLU/RSV assay is intended as an aid in  the diagnosis of influenza from Nasopharyngeal swab specimens and  should not be used as a sole basis for treatment. Nasal washings and  aspirates are unacceptable for Xpert Xpress SARS-CoV-2/FLU/RSV  testing. Fact Sheet for Patients: PinkCheek.be Fact Sheet for Healthcare  Providers: GravelBags.it This test is not yet approved or cleared by the [place] FDA and  has been authorized for detection and/or diagnosis of SARS-CoV-2 by  FDA under an Emergency Use Authorization (EUA). This EUA will remain  in effect (meaning this test can be used) for the duration of the  Covid-19 declaration under Section 564(b)(1) of the Act, 21  U.S.C. section 360bbb-3(b)(1), unless the authorization is  terminated or revoked. Performed at [facility], [place] [address] [place], [place] [address]   Culture, Urine     Status: None   Collection Time: 03/16/19 12:35 PM   Specimen: Urine, Clean Catch  Result Value Ref Range Status   Specimen Description   Final    URINE, CLEAN CATCH Performed at [facility], [place] [address], [place], [place] [address]    Special Requests   Final    NONE Performed at [facility], [place] [address] [place], [place] [address]    Culture   Final    NO GROWTH Performed at [facility], [place] [address] [place], [place] [address]    Report Status 03/17/2019 FINAL  Final    Radiology Reports CT ABDOMEN PELVIS W WO CONTRAST  Result Date: 03/15/2019 CLINICAL DATA:  Right lower quadrant pain, hematuria. EXAM: CT ABDOMEN AND PELVIS WITHOUT AND WITH CONTRAST TECHNIQUE: Multidetector CT imaging of the abdomen and pelvis was performed following the standard protocol before and following the bolus administration of intravenous contrast. CONTRAST:  163mL OMNIPAQUE IOHEXOL 300 MG/ML  SOLN COMPARISON:  No prior imaging is available for comparison. FINDINGS: Lower chest: Signs of mitral and aortic valve replacement. No signs of effusion or consolidation at the lung bases. Numerous bilateral calcified pulmonary nodules indicative of prior infection. Hepatobiliary: Liver is normal. Gallbladder is unremarkable and there is no sign of biliary ductal dilation. Pancreas:  Pancreas is unremarkable without signs of pancreatic lesion, ductal distension or peripancreatic inflammation. Spleen: Spleen with signs of scarring. Adrenals/Urinary Tract: Adrenal glands are normal. Signs of symmetric renal enhancement. The right ureter is involved with inflammation arising from the right lower quadrant and the appendix. The urinary bladder is normal. Stomach/Bowel: Moderate hiatal hernia. No signs of frank bowel obstruction involving small bowel but there are numerous fluid-filled dilated loops without clear transition in the setting of acute ruptured appendicitis. A large appendicular lith is noted at the appendiceal orifice at the level of the cecum this measures approximately 13 mm. There is a defect in wall enhancement involving the distal appendix which is  dilated to 13 mm. Surrounding this area there is fluid that measures approximately 3.1 x 1.3 cm. Vascular/Lymphatic: Calcified and noncalcified atherosclerotic plaque throughout the abdominal aorta. No signs of adenopathy. No signs of pelvic lymphadenopathy. Scattered small lymph nodes throughout the pelvis. Reproductive: Prostate is unremarkable though heterogeneous, nonspecific finding. Other: Fluid in stranding in the right lower quadrant. Process appears contained to this area largely though there is stranding throughout the right retroperitoneum along the lateral conal fascia and anterior renal fascia. No signs of free air. Trace fluid atop the urinary bladder. Signs of bilateral fat containing inguinal hernias. Musculoskeletal: Signs of sternotomy for valve replacement. No signs of acute bone finding or destructive bone process. IMPRESSION: 1. Findings are consistent with ruptured acute appendicitis with obstructing appendicolith. 2. There is a defect in wall enhancement and non peripherally enhancing adjacent fluid that measures approximately 3.1 x 1.3 cm. 3. Findings of concomitant small-bowel ileus. 4. No signs of urothelial  lesion though there is some mild urothelial enhancement which is long segment, presumably due to secondary inflammation in the setting of acute appendicitis. Suggest clinical and imaging follow-up as warranted for reported hematuria. 5. Moderate hiatal hernia. 6. Aortic atherosclerosis. 7. Critical Value/emergent results were called by telephone at the time of interpretation on 03/15/2019 at 4:29 pm to [facility] , who verbally acknowledged these results. Aortic Atherosclerosis (ICD10-I70.0). Electronically Signed   By: Zetta Bills M.D.   On: 03/15/2019 16:29   DG Chest Portable 1 View  Result Date: 03/15/2019 CLINICAL DATA:  Cough.  History of ruptured appendicitis EXAM: PORTABLE CHEST 1 VIEW COMPARISON:  CT of earlier today FINDINGS: Prior median sternotomy. Apical lordotic positioning. Midline trachea. Mild cardiomegaly. Atherosclerosis in the transverse aorta. No pleural effusion or pneumothorax. Clear lungs. IMPRESSION: No acute cardiopulmonary disease. Aortic Atherosclerosis (ICD10-I70.0). Electronically Signed   By: Abigail Miyamoto M.D.   On: 03/15/2019 18:11    Lab Data:  CBC: Recent Labs  Lab 03/15/19 1744 03/16/19 0556 03/17/19 0455  WBC 14.6* 12.6* 9.4  NEUTROABS 12.5*  --   --   HGB 15.4 13.1 13.1  HCT 47.0 41.7 41.8  MCV 93.3 96.1 97.0  PLT 157 144* 0000000*   Basic Metabolic Panel: Recent Labs  Lab 03/15/19 1529 03/15/19 1744 03/16/19 0556 03/17/19 0455  NA  --  137 137 140  K  --  3.6 3.4* 3.7  CL  --  100 101 105  CO2  --  27 26 25   GLUCOSE  --  125* 120* 97  BUN  --  44* 36* 24*  CREATININE 1.70* 1.39* 1.09 0.90  CALCIUM  --  8.9 8.3* 8.4*  MG  --   --   --  2.2   GFR: Estimated Creatinine Clearance: 92.9 mL/min (by C-G formula based on SCr of 0.9 mg/dL). Liver Function Tests: Recent Labs  Lab 03/15/19 1744  AST 32  ALT 22  ALKPHOS 75  BILITOT 1.6*  PROT 7.0  ALBUMIN 4.0   No results for input(s): LIPASE, AMYLASE in the last 168 hours. No  results for input(s): AMMONIA in the last 168 hours. Coagulation Profile: Recent Labs  Lab 03/15/19 1744 03/16/19 0556 03/17/19 0455  INR 3.6* 5.0* 1.4*   Cardiac Enzymes: No results for input(s): CKTOTAL, CKMB, CKMBINDEX, TROPONINI in the last 168 hours. BNP (last 3 results) No results for input(s): PROBNP in the last 8760 hours. HbA1C: No results for input(s): HGBA1C in the last 72 hours. CBG: No results for input(s): GLUCAP in  the last 168 hours. Lipid Profile: No results for input(s): CHOL, HDL, LDLCALC, TRIG, CHOLHDL, LDLDIRECT in the last 72 hours. Thyroid Function Tests: No results for input(s): TSH, T4TOTAL, FREET4, T3FREE, THYROIDAB in the last 72 hours. Anemia Panel: No results for input(s): VITAMINB12, FOLATE, FERRITIN, TIBC, IRON, RETICCTPCT in the last 72 hours. Urine analysis:    Component Value Date/Time   COLORURINE DARK YELLOW 03/15/2019 1140   APPEARANCEUR CLOUDY (A) 03/15/2019 1140   LABSPEC 1.018 03/15/2019 1140   PHURINE 5.0 03/15/2019 1140   GLUCOSEU NEGATIVE 03/15/2019 1140   HGBUR TRACE (A) 03/15/2019 1140   BILIRUBINUR NEGATIVE 02/03/2018 1409   KETONESUR TRACE (A) 03/15/2019 1140   PROTEINUR 2+ (A) 03/15/2019 1140   NITRITE POSITIVE (A) 03/15/2019 1140   LEUKOCYTESUR NEGATIVE 03/15/2019 1140     Sherard Sutch M.D. [facility] 03/17/2019, 3:10 PM   Call night coverage person covering after 7pm

## 2019-03-17 NOTE — Progress Notes (Signed)
Pharmacy called regarding patients heparin level. Stated that looking at the [facility], the infusion was paused for 3-4 hours resulting in a lower heparin level than anticipated. Pharmacy asked for me to confirm that will day shift RN and to call back.

## 2019-03-17 NOTE — Progress Notes (Signed)
Initial Nutrition Assessment  INTERVENTION:   -Diet advancement per surgery -Recommend Boost Breeze po TID, each supplement provides 250 kcal and 9 grams of protein once diet is advanced  NUTRITION DIAGNOSIS:   Inadequate oral intake related to poor appetite(acute appendicitis) as evidenced by per patient/family report, NPO status.  GOAL:   Patient will meet greater than or equal to 90% of their needs  MONITOR:   Diet advancement, Labs, Weight trends, I & O's  REASON FOR ASSESSMENT:   Malnutrition Screening Tool    ASSESSMENT:   63 year old male past medical history of CAD status post STEMI and DES, severe AS and MV insufficiency status post mechanical AVR and mechanical MV replacement on Coumadin, status post atrial appendage ligation and maze procedure in 2013, paroxysmal atrial fibrillation, COPD, hypertension and hyperlipidemia who presented to the ED for evaluation of appendicitis and sent in by PCP.  Found to have acute appendicitis with perforation and early abscess formation on CT scan.  **RD working remotely**  Patient reports poor appetite related to pain from acute appendicitis, pain began ~4 days PTA. No BM since pain began. Pt has been NPO since admission on 12/14. Per surgery note, management with antibiotics, no surgery plans yet. Diet may be advanced to clears tomorrow if pt improves. Recommend Boost Breeze supplements if diet is advanced.  Per weight records, pt has lost 6 lbs since January 2020, insignificant weight loss  for time frame.  I/Os: +2.5L since admit  Labs reviewed. Medications reviewed.  NUTRITION - FOCUSED PHYSICAL EXAM:  Working remotely.  Diet Order:   Diet Order            Diet NPO time specified Except for: Ice Chips, Sips with Meds  Diet effective now              EDUCATION NEEDS:   No education needs have been identified at this time  Skin:  Skin Assessment: Reviewed RN Assessment  Last BM:  12/11  Height:   Ht  Readings from Last 1 Encounters:  03/16/19 5\' 9"  (1.753 m)    Weight:   Wt Readings from Last 1 Encounters:  03/16/19 89.4 kg    Ideal Body Weight:  72.7 kg  BMI:  Body mass index is 29.09 kg/m.  Estimated Nutritional Needs:   Kcal:  1800-2000  Protein:  75-85g  Fluid:  1.8L/day  Clayton Bibles, MS, RD, LDN Inpatient Clinical Dietitian Pager: [phone] After Hours Pager: [phone]

## 2019-03-17 NOTE — Progress Notes (Signed)
ANTICOAGULATION CONSULT NOTE - Initial Consult  Pharmacy Consult for heparin Indication: mechanical valve  No Known Allergies  Patient Measurements: Height: 5\' 9"  (175.3 cm) Weight: 197 lb (89.4 kg) IBW/kg (Calculated) : 70.7 Heparin Dosing Weight: 89.4kg  Vital Signs: Temp: 97.7 F (36.5 C) (12/16 0517) Temp Source: Oral (12/16 0517) BP: 117/57 (12/16 0517) Pulse Rate: 69 (12/16 0517)  Labs: Recent Labs    03/15/19 1744 03/16/19 0556 03/17/19 0455  HGB 15.4 13.1 13.1  HCT 47.0 41.7 41.8  PLT 157 144* 128*  LABPROT 35.6* 46.2* 16.5*  INR 3.6* 5.0* 1.4*  CREATININE 1.39* 1.09 0.90    Estimated Creatinine Clearance: 92.9 mL/min (by C-G formula based on SCr of 0.9 mg/dL).   Medical History: Past Medical History:  Diagnosis Date  . COPD (chronic obstructive pulmonary disease) ([place])   . Coronary artery disease   . Hx of artificial heart valve replacement   . PVD (peripheral vascular disease) ([place])      Assessment: 63yo male with past medical hx of CAD s/p STEMI and DES, mechanical AVR and MV on coumadin, paroxysmal AFib who presents to ED for evaluation of appendicitis.  Pharmacy consulted to dose heparin drip . 03/17/2019 INR 1.4 (Vitamin K 1mg  IV x 1 on 12/15 @ 0950) H/H WNL Plts 128  Goal of Therapy:  Heparin level 0.3-0.7 units/ml Monitor platelets by anticoagulation protocol:   Plan:  Heparin bolus 3000 units then start heparin drip at 1250 units/hr Heparin level in 6 hours Daily CBC  Dolly Rias RPh 03/17/2019, 12:45 PM

## 2019-03-17 NOTE — Progress Notes (Addendum)
ANTICOAGULATION CONSULT NOTE  Pharmacy Consult for heparin Indication: mechanical AVR/MVR, atrial fibrillation   No Known Allergies  Patient Measurements: Height: 5\' 9"  (175.3 cm) Weight: 197 lb (89.4 kg) IBW/kg (Calculated) : 70.7 Heparin Dosing Weight: 88.7 kg  Vital Signs: Temp: 97.9 F (36.6 C) (12/16 1351) Temp Source: Oral (12/16 1351) BP: 135/66 (12/16 1351) Pulse Rate: 70 (12/16 1351)  Labs: Recent Labs    03/15/19 1744 03/16/19 0556 03/17/19 0455 03/17/19 1906  HGB 15.4 13.1 13.1  --   HCT 47.0 41.7 41.8  --   PLT 157 144* 128*  --   LABPROT 35.6* 46.2* 16.5*  --   INR 3.6* 5.0* 1.4*  --   HEPARINUNFRC  --   --   --  <0.10*  CREATININE 1.39* 1.09 0.90  --     Estimated Creatinine Clearance: 92.9 mL/min (by C-G formula based on SCr of 0.9 mg/dL).   Medical History: Past Medical History:  Diagnosis Date  . COPD (chronic obstructive pulmonary disease) ([place])   . Coronary artery disease   . Hx of artificial heart valve replacement   . PVD (peripheral vascular disease) ([place])      Assessment: 63yo male with PMH of CAD s/p STEMI and DES, mechanical AVR and MVR, paroxysmal a-fib on warfarin PTA who presents to ED for evaluation of appendicitis. Vitamin K 1mg  IV x 1 given on 12/15 at 0950 for INR 5. INR = 1.4 today. Pharmacy consulted to dose heparin infusion while warfarin on hold.   Today, 03/17/19:  CBC: H/H WNL. Pltc 128K  Initial heparin level < 0.1, undetectable. Per charting on MAR, appears heparin infusion paused by RN at 1539 and restarted at 1741. However, per night shift RN's discussion with day shift RN, heparin infusion was not stopped at all.  No bleeding issues noted per nursing  Goal of Therapy:  Heparin level 0.3-0.7 units/ml Monitor platelets by anticoagulation protocol: yes   Plan:   Heparin 1500 units bolus x 1, then increase heparin infusion to 1550 units/hr  Heparin level 6 hours after rate change  Daily CBC (closely monitor  Pltc), heparin level  Monitor closely for s/sx of bleeding   Lindell Spar, PharmD, BCPS Clinical Pharmacist  03/17/2019, 8:24 PM

## 2019-03-17 NOTE — Progress Notes (Signed)
Per pharmacy increase heparin rate and give heparin bolus.

## 2019-03-18 DIAGNOSIS — J441 Chronic obstructive pulmonary disease with (acute) exacerbation: Secondary | ICD-10-CM

## 2019-03-18 LAB — CBC
HCT: 43.6 % (ref 39.0–52.0)
Hemoglobin: 13.6 g/dL (ref 13.0–17.0)
MCH: 30.2 pg (ref 26.0–34.0)
MCHC: 31.2 g/dL (ref 30.0–36.0)
MCV: 96.7 fL (ref 80.0–100.0)
Platelets: 142 10*3/uL — ABNORMAL LOW (ref 150–400)
RBC: 4.51 MIL/uL (ref 4.22–5.81)
RDW: 13.2 % (ref 11.5–15.5)
WBC: 9.3 10*3/uL (ref 4.0–10.5)
nRBC: 0 % (ref 0.0–0.2)

## 2019-03-18 LAB — BASIC METABOLIC PANEL
Anion gap: 9 (ref 5–15)
BUN: 20 mg/dL (ref 8–23)
CO2: 28 mmol/L (ref 22–32)
Calcium: 8.6 mg/dL — ABNORMAL LOW (ref 8.9–10.3)
Chloride: 101 mmol/L (ref 98–111)
Creatinine, Ser: 0.94 mg/dL (ref 0.61–1.24)
GFR calc Af Amer: >60 mL/min (ref >60)
GFR calc non Af Amer: >60 mL/min (ref >60)
Glucose, Bld: 94 mg/dL (ref 70–99)
Potassium: 3.4 mmol/L — ABNORMAL LOW (ref 3.5–5.1)
Sodium: 138 mmol/L (ref 135–145)

## 2019-03-18 LAB — HEPARIN LEVEL (UNFRACTIONATED)
Heparin Unfractionated: 0.35 IU/mL (ref 0.30–0.70)
Heparin Unfractionated: 0.32 IU/mL (ref 0.30–0.70)

## 2019-03-18 LAB — PROTIME-INR
INR: 1.6 — ABNORMAL HIGH (ref 0.8–1.2)
Prothrombin Time: 19 seconds — ABNORMAL HIGH (ref 11.4–15.2)

## 2019-03-18 MED ORDER — BUDESONIDE 0.25 MG/2ML IN SUSP
0.2500 mg | Freq: Two times a day (BID) | RESPIRATORY_TRACT | Status: DC
  Filled 2019-03-18: qty 2

## 2019-03-18 MED ORDER — IPRATROPIUM-ALBUTEROL 0.5-2.5 (3) MG/3ML IN SOLN
3.0000 mL | Freq: Four times a day (QID) | RESPIRATORY_TRACT | Status: DC
  Filled 2019-03-18: qty 3

## 2019-03-18 MED ORDER — POTASSIUM CHLORIDE CRYS ER 20 MEQ PO TBCR
40.0000 meq | EXTENDED_RELEASE_TABLET | Freq: Once | ORAL | Status: AC
  Administered 2019-03-18: 40 meq via ORAL
  Filled 2019-03-18: qty 2

## 2019-03-18 MED ORDER — IPRATROPIUM-ALBUTEROL 0.5-2.5 (3) MG/3ML IN SOLN: 3.0000 mL | RESPIRATORY_TRACT | Status: DC | PRN

## 2019-03-18 MED ORDER — BOOST / RESOURCE BREEZE PO LIQD CUSTOM
1.0000 | Freq: Three times a day (TID) | ORAL | Status: DC
  Administered 2019-03-18 – 2019-03-20 (×4): 1 via ORAL

## 2019-03-18 NOTE — Plan of Care (Signed)
Patient lying in bed this morning; pain controlled. No needs expressed. Will continue to monitor.  

## 2019-03-18 NOTE — Progress Notes (Signed)
ANTICOAGULATION CONSULT NOTE - Follow Up Consult  Pharmacy Consult for Heparin Indication: mechanical AVR/MVR, atrial fibrillation   No Known Allergies  Patient Measurements: Height: 5\' 9"  (175.3 cm) Weight: 197 lb (89.4 kg) IBW/kg (Calculated) : 70.7 Heparin Dosing Weight:   Vital Signs: Temp: 98.5 F (36.9 C) (12/16 2035) Temp Source: Oral (12/16 2035) BP: 129/54 (12/16 2035) Pulse Rate: 73 (12/16 2035)  Labs: Recent Labs    03/16/19 0556 03/17/19 0455 03/17/19 1906 03/18/19 0233  HGB 13.1 13.1  --  13.6  HCT 41.7 41.8  --  43.6  PLT 144* 128*  --  142*  LABPROT 46.2* 16.5*  --  19.0*  INR 5.0* 1.4*  --  1.6*  HEPARINUNFRC  --   --  <0.10* 0.35  CREATININE 1.09 0.90  --  0.94    Estimated Creatinine Clearance: 89 mL/min (by C-G formula based on SCr of 0.94 mg/dL).   Medications:  Infusions:  . sodium chloride 75 mL/hr at 03/17/19 1810  . heparin 1,550 Units/hr (03/17/19 2059)  . piperacillin-tazobactam (ZOSYN)  IV 3.375 g (03/17/19 2358)    Assessment: Patient with heparin level at goal.  No heparin issues noted.  Goal of Therapy:  Heparin level 0.3-0.7 units/ml Monitor platelets by anticoagulation protocol: Yes   Plan:  Continue heparin drip at current rate Recheck level at [address] [place] 03/18/2019,3:55 AM

## 2019-03-18 NOTE — Progress Notes (Signed)
Patient ID: [name], male   DOB: [birth year], 63 y.o.   MRN: [id number]       Subjective: Patient feels better today.  No nausea.  Still feels a little bloated, but not bad.  Still wheezing  ROS: See above, otherwise other systems negative  Objective: Vital signs in last 24 hours: Temp:  [97.9 F (36.6 C)-98.5 F (36.9 C)] 98.3 F (36.8 C) (12/17 0607) Pulse Rate:  [70-73] 73 (12/17 0607) Resp:  [14-16] 15 (12/17 0607) BP: (128-135)/(54-66) 128/62 (12/17 0607) SpO2:  [98 %] 98 % (12/17 0607) Last BM Date: 03/12/19  Intake/Output from previous day: 12/16 0701 - 12/17 0700 In: 2129 [P.O.:100; I.V.:1894.8; IV Piggyback:134.2] Out: 1300 [Urine:1300] Intake/Output this shift: No intake/output data recorded.  PE: Heart: regular Lungs: B wheezing Abd: soft, less tender today, +BS, minimal bloating  Lab Results:  Recent Labs    03/17/19 0455 03/18/19 0233  WBC 9.4 9.3  HGB 13.1 13.6  HCT 41.8 43.6  PLT 128* 142*   BMET Recent Labs    03/17/19 0455 03/18/19 0233  NA 140 138  K 3.7 3.4*  CL 105 101  CO2 25 28  GLUCOSE 97 94  BUN 24* 20  CREATININE 0.90 0.94  CALCIUM 8.4* 8.6*   PT/INR Recent Labs    03/17/19 0455 03/18/19 0233  LABPROT 16.5* 19.0*  INR 1.4* 1.6*   CMP     Component Value Date/Time   NA 138 03/18/2019 0233   K 3.4 (L) 03/18/2019 0233   CL 101 03/18/2019 0233   CO2 28 03/18/2019 0233   GLUCOSE 94 03/18/2019 0233   BUN 20 03/18/2019 0233   CREATININE 0.94 03/18/2019 0233   CALCIUM 8.6 (L) 03/18/2019 0233   PROT 7.0 03/15/2019 1744   ALBUMIN 4.0 03/15/2019 1744   AST 32 03/15/2019 1744   ALT 22 03/15/2019 1744   ALKPHOS 75 03/15/2019 1744   BILITOT 1.6 (H) 03/15/2019 1744   GFRNONAA >60 03/18/2019 0233   GFRAA >60 03/18/2019 0233   Lipase     Component Value Date/Time   LIPASE 39 02/03/2018 1216       Studies/Results: No results found.  Anti-infectives: Anti-infectives (From admission, onward)   Start      Dose/Rate Route Frequency Ordered Stop   03/16/19 0000  piperacillin-tazobactam (ZOSYN) IVPB 3.375 g     3.375 g 12.5 mL/hr over 240 Minutes Intravenous Every 8 hours 03/15/19 2110     03/15/19 1730  piperacillin-tazobactam (ZOSYN) IVPB 3.375 g     3.375 g 100 mL/hr over 30 Minutes Intravenous  Once 03/15/19 1728 03/15/19 1908       Assessment/Plan S/p mechanical aortic and mitral valve replacement  On chronic anticoagulation - coumadin on hold, INR 1.4.  Per medicine, likely plan for heparin bridge CAD, STEMI s/p DES to RCA 10/2017 HTN COPD HLD Tobacco abuse  Acute appendicitis with perforation and early abscess formation, appendicolith - IR unable to drain fluid collection due to size -cont IV abx therapy -less tender today with normal WBC and AF.  Will adv to clear liquids today. -mobilize -pulm toilet  ID -zosyn 12/14>> FEN -IVF, CLD VTE -SCDs, coumadin on hold, heparin bridge Foley -none Follow up -TBD   LOS: 3 days    Henreitta Cea , [facility] 03/18/2019, 9:23 AM Please see Amion for pager number during day hours 7:00am-4:30pm

## 2019-03-18 NOTE — Progress Notes (Signed)
[facility]                                                                              Patient Demographics  [name], is a 63 y.o. male, DOB - 10-31-1955, [id number]  Admit date - 03/15/2019   Admitting Physician Lenore Cordia, MD  Outpatient Primary MD for the patient is [name], MD  Outpatient specialists:   LOS - 3  days   Medical records reviewed and are as summarized below:    Chief Complaint  Patient presents with  . Abdominal Pain       Brief summary   Patient is a 63 year old male past medical history of CAD status post STEMI and DES, severe AS and MV insufficiency status post mechanical AVR and mechanical MV replacement on Coumadin, status post atrial appendage ligation and maze procedure in 2013, paroxysmal atrial fibrillation, COPD, hypertension and hyperlipidemia who presented to the ED for evaluation of appendicitis and sent in by PCP.  Found to have acute appendicitis with perforation and early abscess formation on CT scan.  Discussed with general surgery and IR.  Per IR fluid collection is too small to drain and recommended continuing medical management and consider repeat scan if not improving.  Patient found to have supratherapeutic INR of 5.0 on 12/15 and was given low-dose IV vitamin K though he was not bleeding he was n.p.o. at the time.    Assessment & Plan    Principal Problem:   Acute appendicitis with rupture, early abscess formation, appendicolith - Presented with abdominal pain.  CT scan showedacute appendicitis with obstructing appendicolithandadjacent fluidcollection3.1 x 1.3 cm; ileus. - General surgery was consulted, recommended IR evaluation for possible percutaneous drain - Per IR, fluid collection too small to drain and recommended IV antibiotics and medical management, repeat CT scan if patient is not improving - General surgery following, placed on clear liquid diet - Overall patient feels less  tender, no plans for any acute surgical intervention at this time -remains on IV heparin bridge.  Active Problems:  Small bowel ileus -Improving, started on clears today  Mild acute COPD exacerbation - Wheezing bilaterally at the time of my examination.  - Placed on scheduled nebs, flutter valve, Pulmicort. - States that he does not want p.o. steroids, causes tachycardia  Status post mechanical aortic and mitral valve -Patient presented with supratherapeutic INR, 5.0, was reversed -continue IV heparin drip  Paroxysmal A. Fib -Currently normal sinus rhythm, hold Coumadin -Continue IV heparin drip, heart rate controlled  CAD status post inferior STEMI status post DES to RCA 10/2017 -Currently no chest pain or acute shortness of breath, EKG with no acute ischemic changes - restart aspirin, Coreg, Lipitor once consistently tolerating diet  -Continue IV heparin drip  Essential hypertension -BP currently stable  Hyperlipidemia -Holding Lipitor, n.p.o.  History of tobacco use -Reports smoking half pack per day, counseled on smoking cessation  Hypokalemia - replaced PO  Code Status: Full CODE STATUS DVT Prophylaxis: Heparin drip Family Communication: Discussed all imaging results, lab results, explained to the patient's wife   Disposition Plan: Once cleared from surgery and tolerating diet  Time Spent in minutes   25 minutes  Procedures:  CT abdomen  Consultants:   Interventional radiology General surgery  Antimicrobials:   Anti-infectives (From admission, onward)   Start     Dose/Rate Route Frequency Ordered Stop   03/16/19 0000  piperacillin-tazobactam (ZOSYN) IVPB 3.375 g     3.375 g 12.5 mL/hr over 240 Minutes Intravenous Every 8 hours 03/15/19 2110     03/15/19 1730  piperacillin-tazobactam (ZOSYN) IVPB 3.375 g     3.375 g 100 mL/hr over 30 Minutes Intravenous  Once 03/15/19 1728 03/15/19 1908         Medications  Scheduled Meds: . feeding  supplement  1 Container Oral TID BM   Continuous Infusions: . sodium chloride 75 mL/hr at 03/17/19 1810  . heparin 1,600 Units/hr (03/18/19 1410)  . piperacillin-tazobactam (ZOSYN)  IV 3.375 g (03/18/19 0830)   PRN Meds:.acetaminophen **OR** acetaminophen, ipratropium-albuterol, metoprolol tartrate, morphine injection, ondansetron **OR** ondansetron (ZOFRAN) IV      Subjective:   [name] was seen and examined today.  Overall improving, still has right lower abdominal pain 5/10, better from yesterday, feels much less bloated today.  No fevers or chills, nausea or vomiting.   No chest pain or shortness of breath.    Objective:   Vitals:   03/17/19 2035 03/18/19 0607 03/18/19 1218 03/18/19 1324  BP: (!) 129/54 128/62  (!) 149/68  Pulse: 73 73 76 73  Resp: 14 15 16 17   Temp: 98.5 F (36.9 C) 98.3 F (36.8 C)  98.5 F (36.9 C)  TempSrc: Oral Oral  Oral  SpO2: 98% 98% 97% 97%  Weight:      Height:        Intake/Output Summary (Last 24 hours) at 03/18/2019 1431 Last data filed at 03/18/2019 1324 Gross per 24 hour  Intake 2368.98 ml  Output 500 ml  Net 1868.98 ml     Wt Readings from Last 3 Encounters:  03/16/19 89.4 kg  03/15/19 88.5 kg  04/24/18 92.3 kg   Physical Exam  General: Alert and oriented x 3, NAD  Eyes:   HEENT:  Atraumatic, normocephalic  Cardiovascular: S1 S2 clear, RRR. No pedal edema b/l  Respiratory: Bilateral expiratory wheezing  Gastrointestinal: Soft, TTP in RLQ, mild distention improving  Ext: no pedal edema bilaterally  Neuro: no new deficits  Musculoskeletal: No cyanosis, clubbing  Skin: No rashes  Psych: Normal affect and demeanor, alert and oriented x3      Data Reviewed:  I have personally reviewed following labs and imaging studies  Micro Results Recent Results (from the past 240 hour(s))  Respiratory Panel by RT PCR (Flu A&B, Covid) - Nasopharyngeal Swab     Status: None   Collection Time: 03/15/19  5:44 PM    Specimen: Nasopharyngeal Swab  Result Value Ref Range Status   SARS Coronavirus 2 by RT PCR NEGATIVE NEGATIVE Final    Comment: (NOTE) SARS-CoV-2 target nucleic acids are NOT DETECTED. The SARS-CoV-2 RNA is generally detectable in upper respiratoy specimens during the acute phase of infection. The lowest concentration of SARS-CoV-2 viral copies this assay can detect is 131 copies/mL. A negative result does not preclude SARS-Cov-2 infection and should not be used as the sole basis for treatment or other patient management decisions. A negative result may occur with  improper specimen collection/handling, submission of specimen other than nasopharyngeal swab, presence of viral mutation(s) within the areas targeted by this assay, and inadequate number of viral copies (<131 copies/mL). A negative  result must be combined with clinical observations, patient history, and epidemiological information. The expected result is Negative. Fact Sheet for Patients:  PinkCheek.be Fact Sheet for Healthcare Providers:  GravelBags.it This test is not yet ap proved or cleared by the [place] FDA and  has been authorized for detection and/or diagnosis of SARS-CoV-2 by FDA under an Emergency Use Authorization (EUA). This EUA will remain  in effect (meaning this test can be used) for the duration of the COVID-19 declaration under Section 564(b)(1) of the Act, 21 U.S.C. section 360bbb-3(b)(1), unless the authorization is terminated or revoked sooner.    Influenza A by PCR NEGATIVE NEGATIVE Final   Influenza B by PCR NEGATIVE NEGATIVE Final    Comment: (NOTE) The Xpert Xpress SARS-CoV-2/FLU/RSV assay is intended as an aid in  the diagnosis of influenza from Nasopharyngeal swab specimens and  should not be used as a sole basis for treatment. Nasal washings and  aspirates are unacceptable for Xpert Xpress SARS-CoV-2/FLU/RSV  testing. Fact Sheet  for Patients: PinkCheek.be Fact Sheet for Healthcare Providers: GravelBags.it This test is not yet approved or cleared by the [place] FDA and  has been authorized for detection and/or diagnosis of SARS-CoV-2 by  FDA under an Emergency Use Authorization (EUA). This EUA will remain  in effect (meaning this test can be used) for the duration of the  Covid-19 declaration under Section 564(b)(1) of the Act, 21  U.S.C. section 360bbb-3(b)(1), unless the authorization is  terminated or revoked. Performed at [facility], [place] [address], [place], [place] [address]   Culture, Urine     Status: None   Collection Time: 03/16/19 12:35 PM   Specimen: Urine, Clean Catch  Result Value Ref Range Status   Specimen Description   Final    URINE, CLEAN CATCH Performed at [facility], [place] [address] [place], [place] [address]    Special Requests   Final    NONE Performed at [facility], [place] [address], [place], [place] [address]    Culture   Final    NO GROWTH Performed at [facility], [place] [address] [place],  [address]    Report Status 03/17/2019 FINAL  Final    Radiology Reports CT ABDOMEN PELVIS W WO CONTRAST  Result Date: 03/15/2019 CLINICAL DATA:  Right lower quadrant pain, hematuria. EXAM: CT ABDOMEN AND PELVIS WITHOUT AND WITH CONTRAST TECHNIQUE: Multidetector CT imaging of the abdomen and pelvis was performed following the standard protocol before and following the bolus administration of intravenous contrast. CONTRAST:  129mL OMNIPAQUE IOHEXOL 300 MG/ML  SOLN COMPARISON:  No prior imaging is available for comparison. FINDINGS: Lower chest: Signs of mitral and aortic valve replacement. No signs of effusion or consolidation at the lung bases. Numerous bilateral calcified pulmonary nodules indicative of prior infection. Hepatobiliary: Liver is normal.  Gallbladder is unremarkable and there is no sign of biliary ductal dilation. Pancreas: Pancreas is unremarkable without signs of pancreatic lesion, ductal distension or peripancreatic inflammation. Spleen: Spleen with signs of scarring. Adrenals/Urinary Tract: Adrenal glands are normal. Signs of symmetric renal enhancement. The right ureter is involved with inflammation arising from the right lower quadrant and the appendix. The urinary bladder is normal. Stomach/Bowel: Moderate hiatal hernia. No signs of frank bowel obstruction involving small bowel but there are numerous fluid-filled dilated loops without clear transition in the setting of acute ruptured appendicitis. A large appendicular lith is noted at the appendiceal orifice at the level of the cecum  this measures approximately 13 mm. There is a defect in wall enhancement involving the distal appendix which is dilated to 13 mm. Surrounding this area there is fluid that measures approximately 3.1 x 1.3 cm. Vascular/Lymphatic: Calcified and noncalcified atherosclerotic plaque throughout the abdominal aorta. No signs of adenopathy. No signs of pelvic lymphadenopathy. Scattered small lymph nodes throughout the pelvis. Reproductive: Prostate is unremarkable though heterogeneous, nonspecific finding. Other: Fluid in stranding in the right lower quadrant. Process appears contained to this area largely though there is stranding throughout the right retroperitoneum along the lateral conal fascia and anterior renal fascia. No signs of free air. Trace fluid atop the urinary bladder. Signs of bilateral fat containing inguinal hernias. Musculoskeletal: Signs of sternotomy for valve replacement. No signs of acute bone finding or destructive bone process. IMPRESSION: 1. Findings are consistent with ruptured acute appendicitis with obstructing appendicolith. 2. There is a defect in wall enhancement and non peripherally enhancing adjacent fluid that measures approximately 3.1  x 1.3 cm. 3. Findings of concomitant small-bowel ileus. 4. No signs of urothelial lesion though there is some mild urothelial enhancement which is long segment, presumably due to secondary inflammation in the setting of acute appendicitis. Suggest clinical and imaging follow-up as warranted for reported hematuria. 5. Moderate hiatal hernia. 6. Aortic atherosclerosis. 7. Critical Value/emergent results were called by telephone at the time of interpretation on 03/15/2019 at 4:29 pm to [facility] , who verbally acknowledged these results. Aortic Atherosclerosis (ICD10-I70.0). Electronically Signed   By: Zetta Bills M.D.   On: 03/15/2019 16:29   DG Chest Portable 1 View  Result Date: 03/15/2019 CLINICAL DATA:  Cough.  History of ruptured appendicitis EXAM: PORTABLE CHEST 1 VIEW COMPARISON:  CT of earlier today FINDINGS: Prior median sternotomy. Apical lordotic positioning. Midline trachea. Mild cardiomegaly. Atherosclerosis in the transverse aorta. No pleural effusion or pneumothorax. Clear lungs. IMPRESSION: No acute cardiopulmonary disease. Aortic Atherosclerosis (ICD10-I70.0). Electronically Signed   By: Abigail Miyamoto M.D.   On: 03/15/2019 18:11    Lab Data:  CBC: Recent Labs  Lab 03/15/19 1744 03/16/19 0556 03/17/19 0455 03/18/19 0233  WBC 14.6* 12.6* 9.4 9.3  NEUTROABS 12.5*  --   --   --   HGB 15.4 13.1 13.1 13.6  HCT 47.0 41.7 41.8 43.6  MCV 93.3 96.1 97.0 96.7  PLT 157 144* 128* [id number]*   Basic Metabolic Panel: Recent Labs  Lab 03/15/19 1529 03/15/19 1744 03/16/19 0556 03/17/19 0455 03/18/19 0233  NA  --  137 137 140 138  K  --  3.6 3.4* 3.7 3.4*  CL  --  100 101 105 101  CO2  --  27 26 25 28   GLUCOSE  --  125* 120* 97 94  BUN  --  44* 36* 24* 20  CREATININE 1.70* 1.39* 1.09 0.90 0.94  CALCIUM  --  8.9 8.3* 8.4* 8.6*  MG  --   --   --  2.2  --    GFR: Estimated Creatinine Clearance: 89 mL/min (by C-G formula based on SCr of 0.94 mg/dL). Liver Function  Tests: Recent Labs  Lab 03/15/19 1744  AST 32  ALT 22  ALKPHOS 75  BILITOT 1.6*  PROT 7.0  ALBUMIN 4.0   No results for input(s): LIPASE, AMYLASE in the last 168 hours. No results for input(s): AMMONIA in the last 168 hours. Coagulation Profile: Recent Labs  Lab 03/15/19 1744 03/16/19 0556 03/17/19 0455 03/18/19 0233  INR 3.6* 5.0* 1.4* 1.6*   Cardiac Enzymes: No  results for input(s): CKTOTAL, CKMB, CKMBINDEX, TROPONINI in the last 168 hours. BNP (last 3 results) No results for input(s): PROBNP in the last 8760 hours. HbA1C: No results for input(s): HGBA1C in the last 72 hours. CBG: No results for input(s): GLUCAP in the last 168 hours. Lipid Profile: No results for input(s): CHOL, HDL, LDLCALC, TRIG, CHOLHDL, LDLDIRECT in the last 72 hours. Thyroid Function Tests: No results for input(s): TSH, T4TOTAL, FREET4, T3FREE, THYROIDAB in the last 72 hours. Anemia Panel: No results for input(s): VITAMINB12, FOLATE, FERRITIN, TIBC, IRON, RETICCTPCT in the last 72 hours. Urine analysis:    Component Value Date/Time   COLORURINE DARK YELLOW 03/15/2019 1140   APPEARANCEUR CLOUDY (A) 03/15/2019 1140   LABSPEC 1.018 03/15/2019 1140   PHURINE 5.0 03/15/2019 1140   GLUCOSEU NEGATIVE 03/15/2019 1140   HGBUR TRACE (A) 03/15/2019 1140   BILIRUBINUR NEGATIVE 02/03/2018 1409   KETONESUR TRACE (A) 03/15/2019 1140   PROTEINUR 2+ (A) 03/15/2019 1140   NITRITE POSITIVE (A) 03/15/2019 1140   LEUKOCYTESUR NEGATIVE 03/15/2019 1140     Christinamarie Tall M.D. [facility] 03/18/2019, 2:31 PM   Call night coverage person covering after 7pm

## 2019-03-18 NOTE — Progress Notes (Signed)
ANTICOAGULATION CONSULT NOTE  Pharmacy Consult for heparin Indication: mechanical AVR/MVR, atrial fibrillation   No Known Allergies  Patient Measurements: Height: 5\' 9"  (175.3 cm) Weight: 197 lb (89.4 kg) IBW/kg (Calculated) : 70.7 Heparin Dosing Weight: 88.7 kg  Vital Signs: Temp: 98.3 F (36.8 C) (12/17 0607) Temp Source: Oral (12/17 0607) BP: 128/62 (12/17 0607) Pulse Rate: 76 (12/17 1218)  Labs: Recent Labs    03/16/19 0556 03/17/19 0455 03/17/19 1906 03/18/19 0233 03/18/19 1143  HGB 13.1 13.1  --  13.6  --   HCT 41.7 41.8  --  43.6  --   PLT 144* 128*  --  142*  --   LABPROT 46.2* 16.5*  --  19.0*  --   INR 5.0* 1.4*  --  1.6*  --   HEPARINUNFRC  --   --  <0.10* 0.35 0.32  CREATININE 1.09 0.90  --  0.94  --     Estimated Creatinine Clearance: 89 mL/min (by C-G formula based on SCr of 0.94 mg/dL).   Medical History: Past Medical History:  Diagnosis Date  . COPD (chronic obstructive pulmonary disease) ([place])   . Coronary artery disease   . Hx of artificial heart valve replacement   . PVD (peripheral vascular disease) ([place])      Assessment: 63yo male with PMH of CAD s/p STEMI and DES, mechanical AVR and MVR, paroxysmal a-fib on warfarin PTA who presents to ED for evaluation of appendicitis. Vitamin K 1mg  IV x 1 given on 12/15 at 0950 for INR 5. INR = 1.4 today. Pharmacy consulted to dose heparin infusion while warfarin on hold.   Today, 03/18/19:  HL 0.32 therapeutic  CBC: H/H WNL. Pltc 142K  No bleeding issues noted   Goal of Therapy:  Heparin level 0.3-0.7 units/ml Monitor platelets by anticoagulation protocol: yes   Plan:    increase heparin infusion to 1600 units/hr  Daily CBC (closely monitor Pltc), heparin level  Monitor closely for s/sx of bleeding   Dolly Rias RPh 03/18/2019, 1:09 PM

## 2019-03-19 LAB — BASIC METABOLIC PANEL
Anion gap: 8 (ref 5–15)
BUN: 10 mg/dL (ref 8–23)
CO2: 28 mmol/L (ref 22–32)
Calcium: 8.3 mg/dL — ABNORMAL LOW (ref 8.9–10.3)
Chloride: 105 mmol/L (ref 98–111)
Creatinine, Ser: 0.77 mg/dL (ref 0.61–1.24)
GFR calc Af Amer: >60 mL/min (ref >60)
GFR calc non Af Amer: >60 mL/min (ref >60)
Glucose, Bld: 122 mg/dL — ABNORMAL HIGH (ref 70–99)
Potassium: 3.2 mmol/L — ABNORMAL LOW (ref 3.5–5.1)
Sodium: 141 mmol/L (ref 135–145)

## 2019-03-19 LAB — CBC
HCT: 39.5 % (ref 39.0–52.0)
Hemoglobin: 12.6 g/dL — ABNORMAL LOW (ref 13.0–17.0)
MCH: 30.7 pg (ref 26.0–34.0)
MCHC: 31.9 g/dL (ref 30.0–36.0)
MCV: 96.1 fL (ref 80.0–100.0)
Platelets: 132 10*3/uL — ABNORMAL LOW (ref 150–400)
RBC: 4.11 MIL/uL — ABNORMAL LOW (ref 4.22–5.81)
RDW: 13.2 % (ref 11.5–15.5)
WBC: 8.1 10*3/uL (ref 4.0–10.5)
nRBC: 0 % (ref 0.0–0.2)

## 2019-03-19 LAB — PROTIME-INR
INR: 2.4 — ABNORMAL HIGH (ref 0.8–1.2)
Prothrombin Time: 26.4 seconds — ABNORMAL HIGH (ref 11.4–15.2)

## 2019-03-19 LAB — HEPARIN LEVEL (UNFRACTIONATED)
Heparin Unfractionated: 0.17 IU/mL — ABNORMAL LOW (ref 0.30–0.70)
Heparin Unfractionated: 0.5 IU/mL (ref 0.30–0.70)

## 2019-03-19 MED ORDER — POTASSIUM CHLORIDE CRYS ER 20 MEQ PO TBCR
40.0000 meq | EXTENDED_RELEASE_TABLET | Freq: Once | ORAL | Status: AC
  Administered 2019-03-19: 40 meq via ORAL
  Filled 2019-03-19: qty 2

## 2019-03-19 NOTE — Progress Notes (Signed)
ANTICOAGULATION CONSULT NOTE  Pharmacy Consult for heparin Indication: mechanical AVR/MVR, atrial fibrillation   No Known Allergies  Patient Measurements: Height: 5\' 9"  (175.3 cm) Weight: 197 lb (89.4 kg) IBW/kg (Calculated) : 70.7 Heparin Dosing Weight: 88.7 kg  Vital Signs: Temp: 97.8 F (36.6 C) (12/18 1322) Temp Source: Oral (12/18 1322) BP: 121/60 (12/18 1322) Pulse Rate: 71 (12/18 1322)  Labs: Recent Labs    03/17/19 0455 03/18/19 0233 03/18/19 1143 03/19/19 0530 03/19/19 1351  HGB 13.1 13.6  --  12.6*  --   HCT 41.8 43.6  --  39.5  --   PLT 128* 142*  --  132*  --   LABPROT 16.5* 19.0*  --  26.4*  --   INR 1.4* 1.6*  --  2.4*  --   HEPARINUNFRC  --  0.35 0.32 0.17* 0.50  CREATININE 0.90 0.94  --  0.77  --     Estimated Creatinine Clearance: 104.5 mL/min (by C-G formula based on SCr of 0.77 mg/dL).   Medical History: Past Medical History:  Diagnosis Date  . COPD (chronic obstructive pulmonary disease) ([place])   . Coronary artery disease   . Hx of artificial heart valve replacement   . PVD (peripheral vascular disease) ([place])      Assessment: 63yo male with PMH of CAD s/p STEMI and DES, mechanical AVR and MVR, paroxysmal a-fib on warfarin PTA who presents to ED for evaluation of appendicitis. Vitamin K 1mg  IV x 1 given on 12/15 at 0950 for INR 5. INR = 1.4 today. Pharmacy consulted to dose heparin infusion while warfarin on hold.   Today, 03/19/19:  HL 0.50 therapeutic  CBC: H/H WNL. Pltc 142K  No bleeding issues noted   despite no coumadin INR returned to 2.4 today   Goal of Therapy:  Heparin level 0.3-0.7 units/ml Monitor platelets by anticoagulation protocol: yes   Plan:    continue heparin infusion at 1600 units/hr  Daily CBC, heparin level  Monitor closely for s/sx of bleeding   Dolly Rias RPh 03/19/2019, 2:40 PM

## 2019-03-19 NOTE — Progress Notes (Signed)
ANTICOAGULATION CONSULT NOTE - Follow Up Consult  Pharmacy Consult for Heparin Indication: mechanical AVR/MVR, atrial fibrillation  No Known Allergies  Patient Measurements: Height: 5\' 9"  (175.3 cm) Weight: 197 lb (89.4 kg) IBW/kg (Calculated) : 70.7 Heparin Dosing Weight:   Vital Signs: Temp: 97.9 F (36.6 C) (12/18 0626) Temp Source: Oral (12/18 0626) BP: 130/53 (12/18 0626) Pulse Rate: 64 (12/18 0626)  Labs: Recent Labs    03/17/19 0455 03/18/19 0233 03/18/19 1143 03/19/19 0530  HGB 13.1 13.6  --  12.6*  HCT 41.8 43.6  --  39.5  PLT 128* 142*  --  132*  LABPROT 16.5* 19.0*  --  26.4*  INR 1.4* 1.6*  --  2.4*  HEPARINUNFRC  --  0.35 0.32 0.17*  CREATININE 0.90 0.94  --  0.77    Estimated Creatinine Clearance: 104.5 mL/min (by C-G formula based on SCr of 0.77 mg/dL).   Medications:  Infusions:  . sodium chloride 75 mL/hr at 03/18/19 2339  . heparin 1,600 Units/hr (03/18/19 1800)  . piperacillin-tazobactam (ZOSYN)  IV 3.375 g (03/19/19 0103)    Assessment: Patient with low heparin level.  RN called to say that heparin drip was off/beeping for up to one hour before new bag hung.  This was before the AM level was drawn.  No other heparin issues noted.  Goal of Therapy:  Heparin level 0.3-0.7 units/ml Monitor platelets by anticoagulation protocol: Yes   Plan:  Continue heparin drip at current rate Recheck level at [place], [place] 03/19/2019,6:39 AM

## 2019-03-19 NOTE — Progress Notes (Signed)
[facility]                                                                              Patient Demographics  [name], is a 63 y.o. male, DOB - 1956-02-02, [id number]  Admit date - 03/15/2019   Admitting Physician Lenore Cordia, MD  Outpatient Primary MD for the patient is [name], MD  Outpatient specialists:   LOS - 4  days   Medical records reviewed and are as summarized below:    Chief Complaint  Patient presents with  . Abdominal Pain       Brief summary   Patient is a 63 year old male past medical history of CAD status post STEMI and DES, severe AS and MV insufficiency status post mechanical AVR and mechanical MV replacement on Coumadin, status post atrial appendage ligation and maze procedure in 2013, paroxysmal atrial fibrillation, COPD, hypertension and hyperlipidemia who presented to the ED for evaluation of appendicitis and sent in by PCP.  Found to have acute appendicitis with perforation and early abscess formation on CT scan.  Discussed with general surgery and IR.  Per IR fluid collection is too small to drain and recommended continuing medical management and consider repeat scan if not improving.  Patient found to have supratherapeutic INR of 5.0 on 12/15 and was given low-dose IV vitamin K though he was not bleeding he was n.p.o. at the time.    Assessment & Plan    Principal Problem:   Acute appendicitis with rupture, early abscess formation, appendicolith - Presented with abdominal pain.  CT scan showedacute appendicitis with obstructing appendicolithandadjacent fluidcollection3.1 x 1.3 cm; ileus. - General surgery was consulted, recommended IR evaluation for possible percutaneous drain - Per IR, fluid collection too small to drain and recommended IV antibiotics and medical management, repeat CT scan if patient is not improving -Overall improving, diet advanced to full liquids -No surgical interventions planned at  this time.  Pain is improving  Active Problems:  Small bowel ileus -Improving, diet advanced to full liquids  Mild acute COPD exacerbation -Wheezing much improved, -Continue nebs as needed, Pulmicort.  Flutter valve as needed  - States that he does not want p.o. steroids, causes tachycardia  Status post mechanical aortic and mitral valve -Patient presented with supratherapeutic INR, 5.0, was reversed -Currently on IV heparin drip, INR 2.4, goal 2.5-3.5 -If plan to discharge home, will assess in a.m. if he will need a Lovenox bridge or not.  Paroxysmal A. Fib -Currently normal sinus rhythm, Coumadin currently held -Continue IV heparin drip  CAD status post inferior STEMI status post DES to RCA 10/2017 -Currently no chest pain or acute shortness of breath, EKG with no acute ischemic changes -Currently Coreg and Lipitor held -Continue IV heparin drip  Essential hypertension -BP currently stable  Hyperlipidemia -Lipitor currently held  History of tobacco use -Reports smoking half pack per day, counseled on smoking cessation  Hypokalemia -Replaced p.o.  Code Status: Full CODE STATUS DVT Prophylaxis: Heparin drip Family Communication: Discussed all imaging results, lab results, explained to the patient's wife   Disposition Plan: Once cleared from surgery and tolerating diet  Time Spent in minutes   25 minutes  Procedures:  CT abdomen  Consultants:   Interventional radiology General surgery  Antimicrobials:   Anti-infectives (From admission, onward)   Start     Dose/Rate Route Frequency Ordered Stop   03/16/19 0000  piperacillin-tazobactam (ZOSYN) IVPB 3.375 g     3.375 g 12.5 mL/hr over 240 Minutes Intravenous Every 8 hours 03/15/19 2110     03/15/19 1730  piperacillin-tazobactam (ZOSYN) IVPB 3.375 g     3.375 g 100 mL/hr over 30 Minutes Intravenous  Once 03/15/19 1728 03/15/19 1908         Medications  Scheduled Meds: . feeding supplement  1  Container Oral TID BM   Continuous Infusions: . sodium chloride 75 mL/hr at 03/19/19 1143  . heparin 1,600 Units/hr (03/18/19 1800)  . piperacillin-tazobactam (ZOSYN)  IV 3.375 g (03/19/19 0908)   PRN Meds:.acetaminophen **OR** acetaminophen, ipratropium-albuterol, metoprolol tartrate, morphine injection, ondansetron **OR** ondansetron (ZOFRAN) IV      Subjective:   [name] was seen and examined today.  Not improving, no fevers or chills, pain is improving, 3/10.  No nausea or vomiting or any diarrhea.  Wants solid food.  Hoping to go home soon.  No chest pain or shortness of breath  Objective:   Vitals:   03/18/19 1324 03/18/19 2122 03/19/19 0626 03/19/19 1322  BP: (!) 149/68 (!) 135/56 (!) 130/53 121/60  Pulse: 73 79 64 71  Resp: 17 18 18 17   Temp: 98.5 F (36.9 C) 97.8 F (36.6 C) 97.9 F (36.6 C) 97.8 F (36.6 C)  TempSrc: Oral Oral Oral Oral  SpO2: 97% 100% 95%   Weight:      Height:        Intake/Output Summary (Last 24 hours) at 03/19/2019 1357 Last data filed at 03/19/2019 1322 Gross per 24 hour  Intake 3072.43 ml  Output 0 ml  Net 3072.43 ml     Wt Readings from Last 3 Encounters:  03/16/19 89.4 kg  03/15/19 88.5 kg  04/24/18 92.3 kg   Physical Exam  General: Alert and oriented x 3, NAD  Eyes:   HEENT:  Atraumatic, normocephalic  Cardiovascular: S1 S2 clear,  RRR. No pedal edema b/l  Respiratory: CTAB, no wheezing, rales or rhonchi  Gastrointestinal: Mild TTP in RLQ, NBS, soft  Ext: no pedal edema bilaterally  Neuro: no new deficits  Musculoskeletal: No cyanosis, clubbing  Skin: No rashes  Psych: Normal affect and demeanor, alert and oriented x3     Data Reviewed:  I have personally reviewed following labs and imaging studies  Micro Results Recent Results (from the past 240 hour(s))  Respiratory Panel by RT PCR (Flu A&B, Covid) - Nasopharyngeal Swab     Status: None   Collection Time: 03/15/19  5:44 PM   Specimen:  Nasopharyngeal Swab  Result Value Ref Range Status   SARS Coronavirus 2 by RT PCR NEGATIVE NEGATIVE Final    Comment: (NOTE) SARS-CoV-2 target nucleic acids are NOT DETECTED. The SARS-CoV-2 RNA is generally detectable in upper respiratoy specimens during the acute phase of infection. The lowest concentration of SARS-CoV-2 viral copies this assay can detect is 131 copies/mL. A negative result does not preclude SARS-Cov-2 infection and should not be used as the sole basis for treatment or other patient management decisions. A negative result may occur with  improper specimen collection/handling, submission of specimen other than nasopharyngeal swab, presence of viral mutation(s) within the areas targeted by this assay, and inadequate number of  viral copies (<131 copies/mL). A negative result must be combined with clinical observations, patient history, and epidemiological information. The expected result is Negative. Fact Sheet for Patients:  PinkCheek.be Fact Sheet for Healthcare Providers:  GravelBags.it This test is not yet ap proved or cleared by the [place] FDA and  has been authorized for detection and/or diagnosis of SARS-CoV-2 by FDA under an Emergency Use Authorization (EUA). This EUA will remain  in effect (meaning this test can be used) for the duration of the COVID-19 declaration under Section 564(b)(1) of the Act, 21 U.S.C. section 360bbb-3(b)(1), unless the authorization is terminated or revoked sooner.    Influenza A by PCR NEGATIVE NEGATIVE Final   Influenza B by PCR NEGATIVE NEGATIVE Final    Comment: (NOTE) The Xpert Xpress SARS-CoV-2/FLU/RSV assay is intended as an aid in  the diagnosis of influenza from Nasopharyngeal swab specimens and  should not be used as a sole basis for treatment. Nasal washings and  aspirates are unacceptable for Xpert Xpress SARS-CoV-2/FLU/RSV  testing. Fact Sheet for  Patients: PinkCheek.be Fact Sheet for Healthcare Providers: GravelBags.it This test is not yet approved or cleared by the [place] FDA and  has been authorized for detection and/or diagnosis of SARS-CoV-2 by  FDA under an Emergency Use Authorization (EUA). This EUA will remain  in effect (meaning this test can be used) for the duration of the  Covid-19 declaration under Section 564(b)(1) of the Act, 21  U.S.C. section 360bbb-3(b)(1), unless the authorization is  terminated or revoked. Performed at [facility], [place] [address], [place], [place] [address]   Culture, Urine     Status: None   Collection Time: 03/16/19 12:35 PM   Specimen: Urine, Clean Catch  Result Value Ref Range Status   Specimen Description   Final    URINE, CLEAN CATCH Performed at [facility], [place] [address], [place], [place] [address]    Special Requests   Final    NONE Performed at [facility], [place] [address] [place], [place] [address]    Culture   Final    NO GROWTH Performed at [facility], [place] [address] [place],  [address]    Report Status 03/17/2019 FINAL  Final    Radiology Reports CT ABDOMEN PELVIS W WO CONTRAST  Result Date: 03/15/2019 CLINICAL DATA:  Right lower quadrant pain, hematuria. EXAM: CT ABDOMEN AND PELVIS WITHOUT AND WITH CONTRAST TECHNIQUE: Multidetector CT imaging of the abdomen and pelvis was performed following the standard protocol before and following the bolus administration of intravenous contrast. CONTRAST:  162mL OMNIPAQUE IOHEXOL 300 MG/ML  SOLN COMPARISON:  No prior imaging is available for comparison. FINDINGS: Lower chest: Signs of mitral and aortic valve replacement. No signs of effusion or consolidation at the lung bases. Numerous bilateral calcified pulmonary nodules indicative of prior infection. Hepatobiliary: Liver is normal. Gallbladder  is unremarkable and there is no sign of biliary ductal dilation. Pancreas: Pancreas is unremarkable without signs of pancreatic lesion, ductal distension or peripancreatic inflammation. Spleen: Spleen with signs of scarring. Adrenals/Urinary Tract: Adrenal glands are normal. Signs of symmetric renal enhancement. The right ureter is involved with inflammation arising from the right lower quadrant and the appendix. The urinary bladder is normal. Stomach/Bowel: Moderate hiatal hernia. No signs of frank bowel obstruction involving small bowel but there are numerous fluid-filled dilated loops without clear transition in the setting of acute ruptured appendicitis. A large appendicular lith is noted at the appendiceal orifice  at the level of the cecum this measures approximately 13 mm. There is a defect in wall enhancement involving the distal appendix which is dilated to 13 mm. Surrounding this area there is fluid that measures approximately 3.1 x 1.3 cm. Vascular/Lymphatic: Calcified and noncalcified atherosclerotic plaque throughout the abdominal aorta. No signs of adenopathy. No signs of pelvic lymphadenopathy. Scattered small lymph nodes throughout the pelvis. Reproductive: Prostate is unremarkable though heterogeneous, nonspecific finding. Other: Fluid in stranding in the right lower quadrant. Process appears contained to this area largely though there is stranding throughout the right retroperitoneum along the lateral conal fascia and anterior renal fascia. No signs of free air. Trace fluid atop the urinary bladder. Signs of bilateral fat containing inguinal hernias. Musculoskeletal: Signs of sternotomy for valve replacement. No signs of acute bone finding or destructive bone process. IMPRESSION: 1. Findings are consistent with ruptured acute appendicitis with obstructing appendicolith. 2. There is a defect in wall enhancement and non peripherally enhancing adjacent fluid that measures approximately 3.1 x 1.3 cm. 3.  Findings of concomitant small-bowel ileus. 4. No signs of urothelial lesion though there is some mild urothelial enhancement which is long segment, presumably due to secondary inflammation in the setting of acute appendicitis. Suggest clinical and imaging follow-up as warranted for reported hematuria. 5. Moderate hiatal hernia. 6. Aortic atherosclerosis. 7. Critical Value/emergent results were called by telephone at the time of interpretation on 03/15/2019 at 4:29 pm to [facility] , who verbally acknowledged these results. Aortic Atherosclerosis (ICD10-I70.0). Electronically Signed   By: Zetta Bills M.D.   On: 03/15/2019 16:29   DG Chest Portable 1 View  Result Date: 03/15/2019 CLINICAL DATA:  Cough.  History of ruptured appendicitis EXAM: PORTABLE CHEST 1 VIEW COMPARISON:  CT of earlier today FINDINGS: Prior median sternotomy. Apical lordotic positioning. Midline trachea. Mild cardiomegaly. Atherosclerosis in the transverse aorta. No pleural effusion or pneumothorax. Clear lungs. IMPRESSION: No acute cardiopulmonary disease. Aortic Atherosclerosis (ICD10-I70.0). Electronically Signed   By: Abigail Miyamoto M.D.   On: 03/15/2019 18:11    Lab Data:  CBC: Recent Labs  Lab 03/15/19 1744 03/16/19 0556 03/17/19 0455 03/18/19 0233 03/19/19 0530  WBC 14.6* 12.6* 9.4 9.3 8.1  NEUTROABS 12.5*  --   --   --   --   HGB 15.4 13.1 13.1 13.6 12.6*  HCT 47.0 41.7 41.8 43.6 39.5  MCV 93.3 96.1 97.0 96.7 96.1  PLT 157 144* 128* 142* [id number]*   Basic Metabolic Panel: Recent Labs  Lab 03/15/19 1744 03/16/19 0556 03/17/19 0455 03/18/19 0233 03/19/19 0530  NA 137 137 140 138 141  K 3.6 3.4* 3.7 3.4* 3.2*  CL 100 101 105 101 105  CO2 27 26 25 28 28   GLUCOSE 125* 120* 97 94 122*  BUN 44* 36* 24* 20 10  CREATININE 1.39* 1.09 0.90 0.94 0.77  CALCIUM 8.9 8.3* 8.4* 8.6* 8.3*  MG  --   --  2.2  --   --    GFR: Estimated Creatinine Clearance: 104.5 mL/min (by C-G formula based on SCr of 0.77  mg/dL). Liver Function Tests: Recent Labs  Lab 03/15/19 1744  AST 32  ALT 22  ALKPHOS 75  BILITOT 1.6*  PROT 7.0  ALBUMIN 4.0   No results for input(s): LIPASE, AMYLASE in the last 168 hours. No results for input(s): AMMONIA in the last 168 hours. Coagulation Profile: Recent Labs  Lab 03/15/19 1744 03/16/19 0556 03/17/19 0455 03/18/19 0233 03/19/19 0530  INR 3.6* 5.0* 1.4* 1.6* 2.4*  Cardiac Enzymes: No results for input(s): CKTOTAL, CKMB, CKMBINDEX, TROPONINI in the last 168 hours. BNP (last 3 results) No results for input(s): PROBNP in the last 8760 hours. HbA1C: No results for input(s): HGBA1C in the last 72 hours. CBG: No results for input(s): GLUCAP in the last 168 hours. Lipid Profile: No results for input(s): CHOL, HDL, LDLCALC, TRIG, CHOLHDL, LDLDIRECT in the last 72 hours. Thyroid Function Tests: No results for input(s): TSH, T4TOTAL, FREET4, T3FREE, THYROIDAB in the last 72 hours. Anemia Panel: No results for input(s): VITAMINB12, FOLATE, FERRITIN, TIBC, IRON, RETICCTPCT in the last 72 hours. Urine analysis:    Component Value Date/Time   COLORURINE DARK YELLOW 03/15/2019 1140   APPEARANCEUR CLOUDY (A) 03/15/2019 1140   LABSPEC 1.018 03/15/2019 1140   PHURINE 5.0 03/15/2019 1140   GLUCOSEU NEGATIVE 03/15/2019 1140   HGBUR TRACE (A) 03/15/2019 1140   BILIRUBINUR NEGATIVE 02/03/2018 1409   KETONESUR TRACE (A) 03/15/2019 1140   PROTEINUR 2+ (A) 03/15/2019 1140   NITRITE POSITIVE (A) 03/15/2019 1140   LEUKOCYTESUR NEGATIVE 03/15/2019 1140     Marieelena Bartko M.D. [facility] 03/19/2019, 1:57 PM   Call night coverage person covering after 7pm

## 2019-03-19 NOTE — Progress Notes (Signed)
Patient ID: [name], male   DOB: 1955/07/09, 63 y.o.   MRN: [id number]       Subjective: Having diarrhea.  Tolerating CLD, wants solid food.  Pain less  ROS: See above, otherwise other systems negative  Objective: Vital signs in last 24 hours: Temp:  [97.8 F (36.6 C)-98.5 F (36.9 C)] 97.9 F (36.6 C) (12/18 0626) Pulse Rate:  [64-79] 64 (12/18 0626) Resp:  [16-18] 18 (12/18 0626) BP: (130-149)/(53-68) 130/53 (12/18 0626) SpO2:  [95 %-100 %] 95 % (12/18 0626) Last BM Date: 03/18/19  Intake/Output from previous day: 12/17 0701 - 12/18 0700 In: 2294.2 [P.O.:360; I.V.:1758.7; IV Piggyback:175.6] Out: 0  Intake/Output this shift: Total I/O In: 240 [P.O.:240] Out: -   PE: Heart: regular Lungs: still with wheezing Abd: soft, almost nontender in RLQ today, +BS, ND  Lab Results:  Recent Labs    03/18/19 0233 03/19/19 0530  WBC 9.3 8.1  HGB 13.6 12.6*  HCT 43.6 39.5  PLT 142* 132*   BMET Recent Labs    03/18/19 0233 03/19/19 0530  NA 138 141  K 3.4* 3.2*  CL 101 105  CO2 28 28  GLUCOSE 94 122*  BUN 20 10  CREATININE 0.94 0.77  CALCIUM 8.6* 8.3*   PT/INR Recent Labs    03/18/19 0233 03/19/19 0530  LABPROT 19.0* 26.4*  INR 1.6* 2.4*   CMP     Component Value Date/Time   NA 141 03/19/2019 0530   K 3.2 (L) 03/19/2019 0530   CL 105 03/19/2019 0530   CO2 28 03/19/2019 0530   GLUCOSE 122 (H) 03/19/2019 0530   BUN 10 03/19/2019 0530   CREATININE 0.77 03/19/2019 0530   CALCIUM 8.3 (L) 03/19/2019 0530   PROT 7.0 03/15/2019 1744   ALBUMIN 4.0 03/15/2019 1744   AST 32 03/15/2019 1744   ALT 22 03/15/2019 1744   ALKPHOS 75 03/15/2019 1744   BILITOT 1.6 (H) 03/15/2019 1744   GFRNONAA >60 03/19/2019 0530   GFRAA >60 03/19/2019 0530   Lipase     Component Value Date/Time   LIPASE 39 02/03/2018 1216       Studies/Results: No results found.  Anti-infectives: Anti-infectives (From admission, onward)   Start     Dose/Rate Route Frequency  Ordered Stop   03/16/19 0000  piperacillin-tazobactam (ZOSYN) IVPB 3.375 g     3.375 g 12.5 mL/hr over 240 Minutes Intravenous Every 8 hours 03/15/19 2110     03/15/19 1730  piperacillin-tazobactam (ZOSYN) IVPB 3.375 g     3.375 g 100 mL/hr over 30 Minutes Intravenous  Once 03/15/19 1728 03/15/19 1908       Assessment/Plan S/p mechanical aortic and mitral valve replacement  On chronic anticoagulation - coumadin on hold, INR 2.4. Per medicine, in regards to if he will need a lovenox bridge at discharge, however despite no coumadin INR returned to 2.4 today CAD, STEMI s/p DES to RCA 10/2017 HTN COPD HLD Tobacco abuse  Acute appendicitis with perforation and early abscess formation, appendicolith -IR unable to drain fluid collection due to size -cont IV abx therapy today, if continues to improve, can likely transition to oral abx therapy tomorrow -conts to improve, adv to full liquids today and likely soft diet tomorrow -mobilize -pulm toilet  ID -zosyn 12/14>> FEN -IVF, FLD VTE -SCDs, heparin Foley -none Follow up -[name]   LOS: 4 days    Henreitta Cea , [facility] Surgery 03/19/2019, 9:57 AM Please see Amion for pager number during  day hours 7:00am-4:30pm

## 2019-03-20 ENCOUNTER — Encounter (HOSPITAL_COMMUNITY): Payer: Self-pay | Admitting: Internal Medicine

## 2019-03-20 DIAGNOSIS — I080 Rheumatic disorders of both mitral and aortic valves: Secondary | ICD-10-CM | POA: Diagnosis present

## 2019-03-20 DIAGNOSIS — K389 Disease of appendix, unspecified: Secondary | ICD-10-CM

## 2019-03-20 DIAGNOSIS — I482 Chronic atrial fibrillation, unspecified: Secondary | ICD-10-CM

## 2019-03-20 DIAGNOSIS — Z720 Tobacco use: Secondary | ICD-10-CM

## 2019-03-20 LAB — PROTIME-INR
INR: 1.8 — ABNORMAL HIGH (ref 0.8–1.2)
Prothrombin Time: 21.1 seconds — ABNORMAL HIGH (ref 11.4–15.2)

## 2019-03-20 LAB — BASIC METABOLIC PANEL
Anion gap: 8 (ref 5–15)
BUN: 8 mg/dL (ref 8–23)
CO2: 27 mmol/L (ref 22–32)
Calcium: 8.5 mg/dL — ABNORMAL LOW (ref 8.9–10.3)
Chloride: 107 mmol/L (ref 98–111)
Creatinine, Ser: 0.77 mg/dL (ref 0.61–1.24)
GFR calc Af Amer: >60 mL/min (ref >60)
GFR calc non Af Amer: >60 mL/min (ref >60)
Glucose, Bld: 110 mg/dL — ABNORMAL HIGH (ref 70–99)
Potassium: 3.6 mmol/L (ref 3.5–5.1)
Sodium: 142 mmol/L (ref 135–145)

## 2019-03-20 LAB — HEPARIN LEVEL (UNFRACTIONATED): Heparin Unfractionated: 0.44 IU/mL (ref 0.30–0.70)

## 2019-03-20 MED ORDER — WARFARIN - PHARMACIST DOSING INPATIENT: Freq: Every day | Status: DC

## 2019-03-20 MED ORDER — WARFARIN SODIUM 5 MG PO TABS: 7.5000 mg | ORAL_TABLET | Freq: Every day | ORAL | Status: DC

## 2019-03-20 MED ORDER — AMOXICILLIN-POT CLAVULANATE 875-125 MG PO TABS: 1.0000 | ORAL_TABLET | Freq: Two times a day (BID) | ORAL | 0 refills | Status: AC

## 2019-03-20 MED ORDER — ENOXAPARIN SODIUM 80 MG/0.8ML ~~LOC~~ SOLN
80.0000 mg | Freq: Two times a day (BID) | SUBCUTANEOUS | Status: DC
  Administered 2019-03-20: 80 mg via SUBCUTANEOUS
  Filled 2019-03-20: qty 0.8

## 2019-03-20 MED ORDER — ENOXAPARIN SODIUM 80 MG/0.8ML ~~LOC~~ SOLN: 80.0000 mg | Freq: Two times a day (BID) | SUBCUTANEOUS | 0 refills | Status: DC

## 2019-03-20 MED ORDER — TRAMADOL HCL 50 MG PO TABS: 50.0000 mg | ORAL_TABLET | Freq: Three times a day (TID) | ORAL | 0 refills | Status: DC | PRN

## 2019-03-20 MED ORDER — ONDANSETRON 4 MG PO TBDP: 4.0000 mg | ORAL_TABLET | Freq: Three times a day (TID) | ORAL | 0 refills | Status: DC | PRN

## 2019-03-20 NOTE — Progress Notes (Signed)
[name] [id number] 04-03-1955  CARE TEAM:  PCP: [name], MD  Outpatient Care Team: Patient Care Team: [name], MD as PCP - General (Family Medicine) [name], MD as Consulting Physician (Cardiology)  Inpatient Treatment Team: Treatment Team: Attending Provider: [name], MD; Consulting Physician: [name], MD; Registered Nurse: [name], RN; Rounding Team: [name], MD; Technician: [name], NT; Registered Nurse: [name], RN; Technician: [name], NT; Registered Nurse: [name], RN   Problem List:   Principal Problem:   Acute appendicitis with perforation, appendicolith, and abscess Active Problems:   CAD s/p inferior STEMI s/p DES to RCA in 10/2017    Warfarin anticoagulation   H/O mechanical aortic valve replacement   H/O mitral valve replacement with mechanical valve   Paroxysmal atrial fibrillation (HCC)   Essential hypertension   COPD (chronic obstructive pulmonary disease) ([place])   Hyperlipidemia   Appendicolith   Tobacco abuse   Chronic atrial fibrillation ([place])   Mitral insufficiency and aortic stenosis      * No surgery found *      Assessment  Gradually recovering with antibiotics for perforated appendicitis with fecalith and small abscess  [facility] Stay = 5 days)  Plan:  -Solid diet as tolerated -We will continue antibiotics at discharge.  7 more days.  Oral Augmentin reasonable.  Discussed with medicine service, [name] -Plan to follow-up with [name] to do interval appendectomy in 6-8 weeks. -Transitioning anticoagulation per medicine.  I believe they are planning Lovenox bridge and oral warfarin. -VTE prophylaxis- SCDs, etc -mobilize as tolerated to help recovery  25 minutes spent in review, evaluation, examination, counseling, and coordination of care.  More than 50% of that time was spent in counseling.  03/20/2019    Subjective: (Chief  complaint)  Feels less sore.  Tolerating solid diet.  Wants to go home  Objective:  Vital signs:  Vitals:   03/19/19 0626 03/19/19 1322 03/19/19 2205 03/20/19 0523  BP: (!) 130/53 121/60 (!) 147/71 126/63  Pulse: 64 71 67 66  Resp: 18 17 18 18   Temp: 97.9 F (36.6 C) 97.8 F (36.6 C) 98.2 F (36.8 C) 98.2 F (36.8 C)  TempSrc: Oral Oral Oral Oral  SpO2: 95%  100% 91%  Weight:      Height:        Last BM Date: 03/19/19  Intake/Output   Yesterday:  12/18 0701 - 12/19 0700 In: 3986.8 [P.O.:1560; I.V.:2346.4; IV Piggyback:80.4] Out: -  This shift:  No intake/output data recorded.  Bowel function:  Flatus: YES  BM:  YES  Drain: (No drain)   Physical Exam:  General: Pt awake/alert/oriented x4 in no acute distress Eyes: PERRL, normal EOM.  Sclera clear.  No icterus Neuro: CN II-XII intact w/o focal sensory/motor deficits. Lymph: No head/neck/groin lymphadenopathy Psych:  No delerium/psychosis/paranoia HENT: Normocephalic, Mucus membranes moist.  No thrush Neck: Supple, No tracheal deviation Chest: No chest wall pain w good excursion CV:  Pulses intact.  Regular rhythm MS: Normal AROM mjr joints.  No obvious deformity  Abdomen:  Obese but  Soft.  Mildy distended.  Nontender.  No evidence of peritonitis.  No incarcerated hernias.  Ext:  No deformity.  No mjr edema.  No cyanosis Skin: No petechiae / purpura  Results:   Cultures: Recent Results (from the past 720 hour(s))  Respiratory Panel by RT PCR (Flu A&B, Covid) - Nasopharyngeal Swab     Status: None  Collection Time: 03/15/19  5:44 PM   Specimen: Nasopharyngeal Swab  Result Value Ref Range Status   SARS Coronavirus 2 by RT PCR NEGATIVE NEGATIVE Final    Comment: (NOTE) SARS-CoV-2 target nucleic acids are NOT DETECTED. The SARS-CoV-2 RNA is generally detectable in upper respiratoy specimens during the acute phase of infection. The lowest concentration of SARS-CoV-2 viral copies this assay can  detect is 131 copies/mL. A negative result does not preclude SARS-Cov-2 infection and should not be used as the sole basis for treatment or other patient management decisions. A negative result may occur with  improper specimen collection/handling, submission of specimen other than nasopharyngeal swab, presence of viral mutation(s) within the areas targeted by this assay, and inadequate number of viral copies (<131 copies/mL). A negative result must be combined with clinical observations, patient history, and epidemiological information. The expected result is Negative. Fact Sheet for Patients:  PinkCheek.be Fact Sheet for Healthcare Providers:  GravelBags.it This test is not yet ap proved or cleared by the [place] FDA and  has been authorized for detection and/or diagnosis of SARS-CoV-2 by FDA under an Emergency Use Authorization (EUA). This EUA will remain  in effect (meaning this test can be used) for the duration of the COVID-19 declaration under Section 564(b)(1) of the Act, 21 U.S.C. section 360bbb-3(b)(1), unless the authorization is terminated or revoked sooner.    Influenza A by PCR NEGATIVE NEGATIVE Final   Influenza B by PCR NEGATIVE NEGATIVE Final    Comment: (NOTE) The Xpert Xpress SARS-CoV-2/FLU/RSV assay is intended as an aid in  the diagnosis of influenza from Nasopharyngeal swab specimens and  should not be used as a sole basis for treatment. Nasal washings and  aspirates are unacceptable for Xpert Xpress SARS-CoV-2/FLU/RSV  testing. Fact Sheet for Patients: PinkCheek.be Fact Sheet for Healthcare Providers: GravelBags.it This test is not yet approved or cleared by the [place] FDA and  has been authorized for detection and/or diagnosis of SARS-CoV-2 by  FDA under an Emergency Use Authorization (EUA). This EUA will remain  in effect (meaning  this test can be used) for the duration of the  Covid-19 declaration under Section 564(b)(1) of the Act, 21  U.S.C. section 360bbb-3(b)(1), unless the authorization is  terminated or revoked. Performed at [facility], [place] [address] [place], [place] [address]   Culture, Urine     Status: None   Collection Time: 03/16/19 12:35 PM   Specimen: Urine, Clean Catch  Result Value Ref Range Status   Specimen Description   Final    URINE, CLEAN CATCH Performed at [facility], [place] [address], [place], [place] [address]    Special Requests   Final    NONE Performed at [facility], [place] [address] [place], [place] [address]    Culture   Final    NO GROWTH Performed at [facility], [place] [address], [place],  [address]    Report Status 03/17/2019 FINAL  Final    Labs: Results for orders placed or performed during the hospital encounter of 03/15/19 (from the past 48 hour(s))  Heparin level (unfractionated)     Status: None   Collection Time: 03/18/19 11:43 AM  Result Value Ref Range   Heparin Unfractionated 0.32 0.30 - 0.70 IU/mL    Comment: (NOTE) If heparin results are below expected values, and patient dosage has  been confirmed, suggest follow up testing of antithrombin III levels. Performed at [facility],  [place] [address] [place], [place] [address]   AM BMP     Status: Abnormal   Collection Time: 03/19/19  5:30 AM  Result Value Ref Range   Sodium 141 135 - 145 mmol/L   Potassium 3.2 (L) 3.5 - 5.1 mmol/L   Chloride 105 98 - 111 mmol/L   CO2 28 22 - 32 mmol/L   Glucose, Bld 122 (H) 70 - 99 mg/dL   BUN 10 8 - 23 mg/dL   Creatinine, Ser 0.77 0.61 - 1.24 mg/dL   Calcium 8.3 (L) 8.9 - 10.3 mg/dL   GFR calc non Af Amer >60 >60 mL/min   GFR calc Af Amer >60 >60 mL/min   Anion gap 8 5 - 15    Comment: Performed at [facility], [place] [address] [place] [address] [place], [place] [address]   AM CBC     Status: Abnormal   Collection Time: 03/19/19  5:30 AM  Result Value Ref Range   WBC 8.1 4.0 - 10.5 K/uL   RBC 4.11 (L) 4.22 - 5.81 MIL/uL   Hemoglobin 12.6 (L) 13.0 - 17.0 g/dL   HCT 39.5 39.0 - 52.0 %   MCV 96.1 80.0 - 100.0 fL   MCH 30.7 26.0 - 34.0 pg   MCHC 31.9 30.0 - 36.0 g/dL   RDW 13.2 11.5 - 15.5 %   Platelets 132 (L) 150 - 400 K/uL   nRBC 0.0 0.0 - 0.2 %    Comment: Performed at [facility], [place] [address], [place], [place] [address]  Protime-INR     Status: Abnormal   Collection Time: 03/19/19  5:30 AM  Result Value Ref Range   Prothrombin Time 26.4 (H) 11.4 - 15.2 seconds   INR 2.4 (H) 0.8 - 1.2    Comment: (NOTE) INR goal varies based on device and disease states. Performed at [facility], [place] [address] [place], [place] [address]   Heparin level (unfractionated)     Status: Abnormal   Collection Time: 03/19/19  5:30 AM  Result Value Ref Range   Heparin Unfractionated 0.17 (L) 0.30 - 0.70 IU/mL    Comment: (NOTE) If heparin results are below expected values, and patient dosage has  been confirmed, suggest follow up testing of antithrombin III levels. Performed at [facility], [place] [address], [place], [place] [address]   Heparin level (unfractionated)     Status: None   Collection Time: 03/19/19  1:51 PM  Result Value Ref Range   Heparin Unfractionated 0.50 0.30 - 0.70 IU/mL    Comment: (NOTE) If heparin results are below expected values, and patient dosage has  been confirmed, suggest follow up testing of antithrombin III levels. Performed at [facility], [place] [address] [place], [place] [address]   AM BMP     Status: Abnormal   Collection Time: 03/20/19  4:39 AM  Result Value Ref Range   Sodium 142 135 - 145 mmol/L   Potassium 3.6 3.5 - 5.1 mmol/L   Chloride 107 98 - 111 mmol/L   CO2 27 22 - 32 mmol/L   Glucose, Bld 110 (H) 70 - 99 mg/dL   BUN 8 8 - 23 mg/dL    Creatinine, Ser 0.77 0.61 - 1.24 mg/dL   Calcium 8.5 (L) 8.9 - 10.3 mg/dL   GFR calc non Af Amer >60 >60 mL/min   GFR calc Af Amer >60 >60 mL/min   Anion gap 8 5 - 15  Comment: Performed at [facility], [place] [address] [place], [place] [address]  Protime-INR     Status: Abnormal   Collection Time: 03/20/19  4:39 AM  Result Value Ref Range   Prothrombin Time 21.1 (H) 11.4 - 15.2 seconds   INR 1.8 (H) 0.8 - 1.2    Comment: (NOTE) INR goal varies based on device and disease states. Performed at [facility], [place] [address], [place], [place] [address]   Heparin level (unfractionated)     Status: None   Collection Time: 03/20/19  4:39 AM  Result Value Ref Range   Heparin Unfractionated 0.44 0.30 - 0.70 IU/mL    Comment: (NOTE) If heparin results are below expected values, and patient dosage has  been confirmed, suggest follow up testing of antithrombin III levels. Performed at [facility], [place] [address] [place], [place] [address]     Imaging / Studies: No results found.  Medications / Allergies: per chart  Antibiotics: Anti-infectives (From admission, onward)   Start     Dose/Rate Route Frequency Ordered Stop   03/16/19 0000  piperacillin-tazobactam (ZOSYN) IVPB 3.375 g     3.375 g 12.5 mL/hr over 240 Minutes Intravenous Every 8 hours 03/15/19 2110     03/15/19 1730  piperacillin-tazobactam (ZOSYN) IVPB 3.375 g     3.375 g 100 mL/hr over 30 Minutes Intravenous  Once 03/15/19 1728 03/15/19 1908        Note: Portions of this report may have been transcribed using voice recognition software. Every effort was made to ensure accuracy; however, inadvertent computerized transcription errors may be present.   Any transcriptional errors that result from this process are unintentional.     Adin Hector, MD, FACS, MASCRS Gastrointestinal and Minimally Invasive Surgery    [address], [place], [place] [address] [phone] Main / Paging [phone] Fax

## 2019-03-20 NOTE — Discharge Summary (Signed)
Physician Discharge Summary   Patient ID: [name] MRN: [id number] DOB/AGE: 1955/09/15 63 y.o.  Admit date: 03/15/2019 Discharge date: 03/20/2019  Primary Care Physician:  [name], MD   Recommendations for Outpatient Follow-up:  1. Follow up with PCP in 1-2 weeks 2. Patient placed on Lovenox bridge with Coumadin for INR 2.5-3.5, currently 1.8. 3. Augmentin twice daily for 7 days and follow-up with surgery for appendectomy in 6 to 8 weeks  Home Health: None  Equipment/Devices:   Discharge Condition: stable  CODE STATUS: FULL  Diet recommendation: Soft diet, heart healthy   Discharge Diagnoses:    Acute appendicitis with rupture, early abscess formation, appendicolith Bowel ileus improved Mild acute COPD exacerbation . CAD s/p inferior STEMI s/p DES to RCA in 10/2017  . Paroxysmal atrial fibrillation (HCC) . Mechanical aortic and mitral valve Essential hypertension Hyperlipidemia Hypokalemia History of tobacco use    Consults: General surgery    Allergies:  No Known Allergies   DISCHARGE MEDICATIONS: Allergies as of 03/20/2019   No Known Allergies     Medication List    TAKE these medications   amoxicillin-clavulanate 875-125 MG tablet Commonly known as: Augmentin Take 1 tablet by mouth 2 (two) times daily for 7 days.   aspirin EC 81 MG tablet Take 81 mg by mouth daily.   atorvastatin 80 MG tablet Commonly known as: LIPITOR Take 80 mg by mouth daily.   carvedilol 3.125 MG tablet Commonly known as: COREG Take 3.125 mg by mouth 2 (two) times daily with a meal.   enalapril 5 MG tablet Commonly known as: VASOTEC Take 5 mg by mouth daily.   enoxaparin 80 MG/0.8ML injection Commonly known as: LOVENOX Inject 0.8 mLs (80 mg total) into the skin every 12 (twelve) hours for 7 days. Please continue until INR 2.5-3.5   ondansetron 4 MG disintegrating tablet Commonly known as: Zofran ODT Take 1 tablet (4 mg total) by mouth every 8 (eight)  hours as needed for nausea or vomiting.   traMADol 50 MG tablet Commonly known as: Ultram Take 1 tablet (50 mg total) by mouth every 8 (eight) hours as needed for severe pain.   warfarin 7.5 MG tablet Commonly known as: COUMADIN Take 7.5 mg by mouth See admin instructions. Take every day except Tuesday and Friday (take 5 mg dose)   warfarin 5 MG tablet Commonly known as: COUMADIN Take 5 mg by mouth See admin instructions. Take on Tuesday, Friday        Brief H and P: For complete details please refer to admission H and P, but in brief Patient is a 63 year old male past medical history of CAD status post STEMI and DES, severe AS and MVinsufficiency status post mechanical AVR and mechanical MV replacement on Coumadin, status post atrial appendage ligation and maze procedure in 2013, paroxysmal atrial fibrillation, COPD, hypertension and hyperlipidemia who presented to the ED for evaluation of appendicitis and sent in by PCP. Found to have acute appendicitis with perforation and early abscess formation on CT scan. Discussed with general surgery and IR. Per IR fluid collection is too small to drain and recommended continuing medical management and consider repeat scan if not improving. Patient found to have supratherapeutic INR of 5.0 on 12/15 and was given low-dose IV vitamin K though he was not bleeding he was n.p.o. at the time.   Hospital Course:   Acute appendicitis with rupture, early abscess formation, appendicolith - Presented with abdominal pain.  CT scan showedacute appendicitis with obstructing appendicolithandadjacent  fluidcollection3.1 x 1.3 cm; ileus. - General surgery was consulted, recommended IR evaluation for possible percutaneous drain - Per IR, fluid collection too small to drain and recommended IV antibiotics and medical management, repeat CT scan if patient is not improving -Patient was placed on IV Zosyn, n.p.o. status.  Patient symptoms have significantly  improved and now tolerating solid diet. -Patient now recommended to continue Augmentin for 7 days, follow-up outpatient with general surgery for appendectomy in 6 to 8 weeks  Small bowel ileus improved, tolerating solid diet  Mild acute COPD exacerbation -Wheezing better, patient was placed on duo nebs,  flutter valve  Status post mechanical aortic and mitral valve -Patient presented with supratherapeutic INR, 5.0, was reversed -He was then placed on IV heparin drip while awaiting for possible surgical intervention if needed. -INR 1.8, patient is being discharged on Lovenox bridge with Coumadin for the goal of INR 2.5-3.5.    Paroxysmal A. Fib -Currently normal sinus rhythm -Continue Coumadin and Lovenox bridge  CAD status post inferior STEMI status post DES to RCA 10/2017 -Currently no chest pain or acute shortness of breath, EKG with no acute ischemic changes Continue Coreg, Lipitor  Essential hypertension -BP currently stable  Hyperlipidemia -Lipitor currently held  History of tobacco use -Reports smoking half pack per day, counseled on smoking cessation  Hypokalemia Resolved  Day of Discharge S: No complaints, hoping to go home today, tolerating solid diet, no fevers  BP 126/63 (BP Location: Right Arm)   Pulse 66   Temp 98.2 F (36.8 C) (Oral)   Resp 18   Ht 5\' 9"  (1.753 m)   Wt 89.4 kg   SpO2 91%   BMI 29.09 kg/m   Physical Exam: General: Alert and awake oriented x3 not in any acute distress. HEENT: anicteric sclera, pupils reactive to light and accommodation CVS: S1-S2 clear no murmur rubs or gallops Chest: clear to auscultation bilaterally, no wheezing rales or rhonchi Abdomen: soft nontender, nondistended, normal bowel sounds Extremities: no cyanosis, clubbing or edema noted bilaterally Neuro: Cranial nerves II-XII intact, no focal neurological deficits   The results of significant diagnostics from this hospitalization (including imaging,  microbiology, ancillary and laboratory) are listed below for reference.      Procedures/Studies:  CT ABDOMEN PELVIS W WO CONTRAST  Result Date: 03/15/2019 CLINICAL DATA:  Right lower quadrant pain, hematuria. EXAM: CT ABDOMEN AND PELVIS WITHOUT AND WITH CONTRAST TECHNIQUE: Multidetector CT imaging of the abdomen and pelvis was performed following the standard protocol before and following the bolus administration of intravenous contrast. CONTRAST:  130mL OMNIPAQUE IOHEXOL 300 MG/ML  SOLN COMPARISON:  No prior imaging is available for comparison. FINDINGS: Lower chest: Signs of mitral and aortic valve replacement. No signs of effusion or consolidation at the lung bases. Numerous bilateral calcified pulmonary nodules indicative of prior infection. Hepatobiliary: Liver is normal. Gallbladder is unremarkable and there is no sign of biliary ductal dilation. Pancreas: Pancreas is unremarkable without signs of pancreatic lesion, ductal distension or peripancreatic inflammation. Spleen: Spleen with signs of scarring. Adrenals/Urinary Tract: Adrenal glands are normal. Signs of symmetric renal enhancement. The right ureter is involved with inflammation arising from the right lower quadrant and the appendix. The urinary bladder is normal. Stomach/Bowel: Moderate hiatal hernia. No signs of frank bowel obstruction involving small bowel but there are numerous fluid-filled dilated loops without clear transition in the setting of acute ruptured appendicitis. A large appendicular lith is noted at the appendiceal orifice at the level of the cecum this measures approximately  13 mm. There is a defect in wall enhancement involving the distal appendix which is dilated to 13 mm. Surrounding this area there is fluid that measures approximately 3.1 x 1.3 cm. Vascular/Lymphatic: Calcified and noncalcified atherosclerotic plaque throughout the abdominal aorta. No signs of adenopathy. No signs of pelvic lymphadenopathy. Scattered  small lymph nodes throughout the pelvis. Reproductive: Prostate is unremarkable though heterogeneous, nonspecific finding. Other: Fluid in stranding in the right lower quadrant. Process appears contained to this area largely though there is stranding throughout the right retroperitoneum along the lateral conal fascia and anterior renal fascia. No signs of free air. Trace fluid atop the urinary bladder. Signs of bilateral fat containing inguinal hernias. Musculoskeletal: Signs of sternotomy for valve replacement. No signs of acute bone finding or destructive bone process. IMPRESSION: 1. Findings are consistent with ruptured acute appendicitis with obstructing appendicolith. 2. There is a defect in wall enhancement and non peripherally enhancing adjacent fluid that measures approximately 3.1 x 1.3 cm. 3. Findings of concomitant small-bowel ileus. 4. No signs of urothelial lesion though there is some mild urothelial enhancement which is long segment, presumably due to secondary inflammation in the setting of acute appendicitis. Suggest clinical and imaging follow-up as warranted for reported hematuria. 5. Moderate hiatal hernia. 6. Aortic atherosclerosis. 7. Critical Value/emergent results were called by telephone at the time of interpretation on 03/15/2019 at 4:29 pm to [facility] , who verbally acknowledged these results. Aortic Atherosclerosis (ICD10-I70.0). Electronically Signed   By: Zetta Bills M.D.   On: 03/15/2019 16:29   DG Chest Portable 1 View  Result Date: 03/15/2019 CLINICAL DATA:  Cough.  History of ruptured appendicitis EXAM: PORTABLE CHEST 1 VIEW COMPARISON:  CT of earlier today FINDINGS: Prior median sternotomy. Apical lordotic positioning. Midline trachea. Mild cardiomegaly. Atherosclerosis in the transverse aorta. No pleural effusion or pneumothorax. Clear lungs. IMPRESSION: No acute cardiopulmonary disease. Aortic Atherosclerosis (ICD10-I70.0). Electronically Signed   By: Abigail Miyamoto M.D.   On: 03/15/2019 18:11       LAB RESULTS: Basic Metabolic Panel: Recent Labs  Lab 03/17/19 0455 03/19/19 0530 03/20/19 0439  NA 140 141 142  K 3.7 3.2* 3.6  CL 105 105 107  CO2 25 28 27   GLUCOSE 97 122* 110*  BUN 24* 10 8  CREATININE 0.90 0.77 0.77  CALCIUM 8.4* 8.3* 8.5*  MG 2.2  --   --    Liver Function Tests: Recent Labs  Lab 03/15/19 1744  AST 32  ALT 22  ALKPHOS 75  BILITOT 1.6*  PROT 7.0  ALBUMIN 4.0   No results for input(s): LIPASE, AMYLASE in the last 168 hours. No results for input(s): AMMONIA in the last 168 hours. CBC: Recent Labs  Lab 03/15/19 1744 03/18/19 0233 03/19/19 0530  WBC 14.6* 9.3 8.1  NEUTROABS 12.5*  --   --   HGB 15.4 13.6 12.6*  HCT 47.0 43.6 39.5  MCV 93.3 96.7 96.1  PLT 157 142* 132*   Cardiac Enzymes: No results for input(s): CKTOTAL, CKMB, CKMBINDEX, TROPONINI in the last 168 hours. BNP: Invalid input(s): POCBNP CBG: No results for input(s): GLUCAP in the last 168 hours.    Disposition and Follow-up: Discharge Instructions    Diet - low sodium heart healthy   Complete by: As directed    Discharge instructions   Complete by: As directed    Please continue Lovenox injections twice a day until INR at goal between 2.5-3.5   Increase activity slowly   Complete by: As directed  DISPOSITION: home    [place]    Greer Pickerel, MD. Schedule an appointment as soon as possible for a visit in 3 week(s).   Specialty: General Surgery Contact information: [address] [place] [address] [phone]        [name], MD. Schedule an appointment as soon as possible for a visit in 1 week(s).   Specialty: Family Medicine Why: please check INR  Contact information: [address] [place] [address] [phone]            Time coordinating discharge:  35 mins   Signed:   Estill Cotta M.D. [facility] 03/20/2019, 12:12 PM

## 2019-03-20 NOTE — Discharge Instructions (Signed)
Complete your oral Augmentin antibiotics to help this infection resolved.  Follow-up with your surgeon to plan interval appendectomy in about 6 weeks.  You are at high risk for getting appendicitis all over again without surgery at some point.  We are working to delay surgery so all you need is an appendectomy and avoid bowel resection with temporary ostomy    Appendicitis, Adult  Appendicitis is inflammation of the appendix. The appendix is a finger-shaped tube that is attached to the large intestine. If appendicitis is not treated, it can cause the appendix to tear (rupture). A ruptured appendix can lead to a life-threatening infection. It can also cause a painful collection of pus (abscess) to form in the appendix. What are the causes? This condition may be caused by a blockage in the appendix that leads to infection. The blockage can be caused by:  A ball of stool (feces).  Enlarged lymph glands. In some cases, the cause may not be known. What increases the risk? Age is a risk factor. You are more likely to develop this condition if you are between 23 and 5 years of age. What are the signs or symptoms? Symptoms of this condition include:  Pain that starts around the belly button and moves toward the lower right part of the abdomen. The pain can become more severe as time passes. It gets worse with coughing or sudden movements.  Tenderness in the lower right abdomen.  Nausea.  Vomiting.  Loss of appetite.  Fever.  Difficulty passing stool (constipation).  Passing very loose stools (diarrhea).  Generally feeling unwell. How is this diagnosed? This condition may be diagnosed with:  A physical exam.  Blood tests.  Urine test. To confirm the diagnosis, an ultrasound, MRI, or CT scan may be done. How is this treated? This condition is usually treated with surgery to remove the appendix (appendectomy). There are two methods for doing an appendectomy:  Open  appendectomy. In this surgery, the appendix is removed through a large incision that is made in the lower right abdomen. This procedure may be recommended if: ? You have major scarring from a previous surgery. ? You have a bleeding disorder. ? You are pregnant and are about to give birth. ? You have a condition that makes it hard to do surgery through small incisions (laparoscopic procedure). This includes severe infection or a ruptured appendix.  Laparoscopic appendectomy. In this surgery, the appendix is removed through small incisions. This procedure usually causes less pain and fewer problems than an open appendectomy. It also has a shorter recovery time. If the appendix has ruptured and an abscess has formed:  A drain may be placed into the abscess to remove fluid.  Antibiotic medicines may be given through an IV.  The appendix may or may not need to be removed. Follow these instructions at home: If you had surgery, follow instructions from your health care provider about how to care for yourself at home and how to care for your incision. Medicines  Take over-the-counter and prescription medicines only as told by your health care provider.  If you were prescribed an antibiotic medicine, take it as told by your health care provider. Do not stop taking the antibiotic even if you start to feel better. Eating and drinking  Follow instructions from your health care provider about eating restrictions. You may slowly resume a regular diet once your nausea or vomiting stops. General instructions  Do not use any products that contain nicotine or tobacco, such  as cigarettes, e-cigarettes, and chewing tobacco. If you need help quitting, ask your health care provider.  Do not drive or use heavy machinery while taking prescription pain medicine.  Ask your health care provider if the medicine prescribed to you can cause constipation. You may need to take steps to prevent or treat constipation,  such as: ? Drink enough fluid to keep your urine pale yellow. ? Take over-the-counter or prescription medicines. ? Eat foods that are high in fiber, such as beans, whole grains, and fresh fruits and vegetables. ? Limit foods that are high in fat and processed sugars, such as fried or sweet foods.  Keep all follow-up visits as told by your health care provider. This is important. Contact a health care provider if:  There is pus, blood, or excessive drainage coming from your incision.  You have nausea or vomiting. Get help right away if you have:  Worsening abdominal pain.  A fever.  Chills.  Fatigue.  Muscle aches.  Shortness of breath. Summary  Appendicitis is inflammation of the appendix.  This condition may be caused by a blockage in the appendix that leads to infection.  This condition is usually treated with surgery to remove the appendix. This information is not intended to replace advice given to you by your health care provider. Make sure you discuss any questions you have with your health care provider. Document Released: 03/18/2005 Document Revised: 09/03/2017 Document Reviewed: 09/03/2017 Elsevier Patient Education  2020 [employer].

## 2019-03-20 NOTE — Plan of Care (Signed)
Reviewed discharge instructions with patient; copy given. IV x2 removed. Patient ready for discharge.

## 2019-03-20 NOTE — Plan of Care (Signed)
Patient sitting up in bed this morning; pain and nausea controlled. No needs expressed. Will continue to monitor.

## 2019-03-20 NOTE — Progress Notes (Signed)
[facility] for heparin> LMWH/coumadin Indication: mechanical AVR/MVR, atrial fibrillation   No Known Allergies  Patient Measurements: Height: 5\' 9"  (175.3 cm) Weight: 197 lb (89.4 kg) IBW/kg (Calculated) : 70.7 Heparin Dosing Weight: 88.7 kg  Vital Signs: Temp: 98.2 F (36.8 C) (12/19 0523) Temp Source: Oral (12/19 0523) BP: 126/63 (12/19 0523) Pulse Rate: 66 (12/19 0523)  Labs: Recent Labs    03/18/19 0233 03/19/19 0530 03/19/19 1351 03/20/19 0439  HGB 13.6 12.6*  --   --   HCT 43.6 39.5  --   --   PLT 142* 132*  --   --   LABPROT 19.0* 26.4*  --  21.1*  INR 1.6* 2.4*  --  1.8*  HEPARINUNFRC 0.35 0.17* 0.50 0.44  CREATININE 0.94 0.77  --  0.77    Estimated Creatinine Clearance: 104.5 mL/min (by C-G formula based on SCr of 0.77 mg/dL).   Assessment: 63yo male with PMH of CAD s/p STEMI and DES, mechanical AVR and MVR, paroxysmal a-fib on warfarin PTA who presents to ED for evaluation of appendicitis. Vitamin K 1mg  IV x 1 given on 12/15 at 0950 for INR 5. INR = 1.4 today. Pharmacy consulted to dose heparin infusion while warfarin on hold.   Today, 03/20/19:  HL 0.44 therapeutic on 1600 units/hr  CBC: H/H WNL. Pltc 142K  No bleeding issues noted   To transition to LMWH and resume coumadin today  INR 1.8, s/p Vitamin K 1 mg IV 12/15  PTA coumadin dose: 7.5 mg qd x 5 mg Tues/Friday  Goal of Therapy:  Heparin level 0.3-0.7 units/ml Monitor platelets by anticoagulation protocol: yes INR 2.5-3.5   Plan:  DC heparin drip LMWH 1 mg/kg sq q12h = 80 q12 Coumadin 7.5 mg qday F/u INR Mon/Tues as OP  Eudelia Bunch, Pharm.D [phone] 03/20/2019 8:32 AM

## 2019-03-30 ENCOUNTER — Ambulatory Visit: Payer: 59 | Admitting: Family Medicine

## 2019-03-30 ENCOUNTER — Other Ambulatory Visit: Payer: Self-pay

## 2019-03-30 VITALS — BP 116/60 | HR 79 | Temp 97.8°F | Resp 18 | Ht 68.0 in | Wt 198.0 lb

## 2019-03-30 DIAGNOSIS — K3532 Acute appendicitis with perforation and localized peritonitis, without abscess: Secondary | ICD-10-CM

## 2019-03-30 DIAGNOSIS — Z952 Presence of prosthetic heart valve: Secondary | ICD-10-CM

## 2019-03-30 DIAGNOSIS — R634 Abnormal weight loss: Secondary | ICD-10-CM | POA: Diagnosis not present

## 2019-03-30 DIAGNOSIS — I480 Paroxysmal atrial fibrillation: Secondary | ICD-10-CM

## 2019-03-30 DIAGNOSIS — Z09 Encounter for follow-up examination after completed treatment for conditions other than malignant neoplasm: Secondary | ICD-10-CM

## 2019-03-30 NOTE — Progress Notes (Signed)
Subjective:    Patient ID: [name], male    DOB: 28-Sep-1955, 63 y.o.   MRN: [id number]  HPI Patient was recently admitted to the hospital with ruptured acute appendicitis.  He was discharged from the hospital on December 19.  I have copied relevant portions of the discharge summary and included them below for my reference:  Brief H and P: For complete details please refer to admission H and P, but in brief Patient is a 63 year old male past medical history of CAD status post STEMI and DES, severe AS and MVinsufficiency status post mechanical AVR and mechanical MVreplacement on Coumadin, status post atrial appendage ligation and maze procedure in 2013, paroxysmal atrial fibrillation, COPD, hypertension and hyperlipidemia who presented to the ED for evaluation of appendicitis and sent in by PCP. Found to have acute appendicitis with perforation and early abscess formation on CT scan. Discussed with general surgery and IR. Per IR fluid collection is too small to drain and recommended continuing medical management and consider repeat scan if not improving. Patient found to have supratherapeutic INR of 5.0 on 12/15 and was given low-dose IV vitamin K though he was not bleeding he was n.p.o. at the time.   Hospital Course:   Acute appendicitis with rupture, early abscess formation, appendicolith - Presented with abdominal pain. CT scan showedacute appendicitis with obstructing appendicolithandadjacent fluidcollection3.1 x 1.3 cm; ileus. - General surgery was consulted, recommended IR evaluation for possible percutaneous drain - Per IR, fluid collection too small to drain and recommended IV antibiotics and medical management, repeat CT scan if patient is not improving -Patient was placed on IV Zosyn, n.p.o. status.  Patient symptoms have significantly improved and now tolerating solid diet. -Patient now recommended to continue Augmentin for 7 days, follow-up outpatient with general  surgery for appendectomy in 6 to 8 weeks  Small bowel ileus improved, tolerating solid diet  Mild acute COPD exacerbation -Wheezing better, patient was placed on duo nebs,  flutter valve  Status post mechanical aortic and mitral valve -Patient presented with supratherapeutic INR, 5.0, was reversed -He was then placed on IV heparin drip while awaiting for possible surgical intervention if needed. -INR 1.8, patient is being discharged on Lovenox bridge with Coumadin for the goal of INR 2.5-3.5.    Paroxysmal A. Fib -Currently normal sinus rhythm -Continue Coumadin and Lovenox bridge  CAD status post inferior STEMI status post DES to RCA 10/2017 -Currently no chest pain or acute shortness of breath, EKG with no acute ischemic changes Continue Coreg, Lipitor  Essential hypertension -BP currently stable  Hyperlipidemia -Lipitor currently held  History of tobacco use -Reports smoking half pack per day, counseled on smoking cessation  Hypokalemia Resolved   03/30/19 Patient has done remarkably well since discharge from the hospital.  He denies any further abdominal pain.  He denies any fevers or chills.  His bowel movements have regulated.  He denies any constipation.  He denies any nausea or vomiting.  He denies any difficulty going to the restroom.  He has no leg swelling.  He denies any shortness of breath.  He denies any dyspnea on exertion.  He denies any pleurisy.  He denies any hemoptysis.  He is back on his Coumadin and just had his Coumadin checked this morning.  His INR was 2.6.  He is no longer on Lovenox bridge.  He is not requiring any medication for pain.  Overall he is doing well with no concerns. Past Medical History:  Diagnosis Date  .  COPD (chronic obstructive pulmonary disease) ([place])   . Coronary artery disease   . Hx of artificial heart valve replacement   . Mitral insufficiency and aortic stenosis   . PVD (peripheral vascular disease) ([place])   .  Rheumatic heart disease    Past Surgical History:  Procedure Laterality Date  . AORTIC AND MITRAL VALVE REPLACEMENT  2013   [facility]?  . CORONARY ANGIOPLASTY WITH STENT PLACEMENT  10/2017  . MAZE  2013   atrial appendage ligation and maze procedure in 2013   Current Outpatient Medications on File Prior to Visit  Medication Sig Dispense Refill  . aspirin EC 81 MG tablet Take 81 mg by mouth daily.    Marland Kitchen atorvastatin (LIPITOR) 80 MG tablet Take 80 mg by mouth daily.    . carvedilol (COREG) 3.125 MG tablet Take 3.125 mg by mouth 2 (two) times daily with a meal.     . enalapril (VASOTEC) 5 MG tablet Take 5 mg by mouth daily.    Marland Kitchen warfarin (COUMADIN) 5 MG tablet Take 5 mg by mouth See admin instructions. Take on Tuesday, Friday    . warfarin (COUMADIN) 7.5 MG tablet Take 7.5 mg by mouth See admin instructions. Take every day except Tuesday and Friday (take 5 mg dose)    . enoxaparin (LOVENOX) 80 MG/0.8ML injection Inject 0.8 mLs (80 mg total) into the skin every 12 (twelve) hours for 7 days. Please continue until INR 2.5-3.5 (Patient not taking: Reported on 03/30/2019) 11.2 mL 0   No current facility-administered medications on file prior to visit.   No Known Allergies Social History   Socioeconomic History  . Marital status: Single    Spouse name: Not on file  . Number of children: Not on file  . Years of education: Not on file  . Highest education level: Not on file  Occupational History  . Not on file  Tobacco Use  . Smoking status: Current Every Day Smoker    Types: Cigarettes  . Smokeless tobacco: Never Used  Substance and Sexual Activity  . Alcohol use: Yes  . Drug use: Never  . Sexual activity: Not on file  Other Topics Concern  . Not on file  Social History Narrative  . Not on file   Social Determinants of Health   Financial Resource Strain:   . Difficulty of Paying Living Expenses: Not on file  Food Insecurity:   . Worried About Charity fundraiser in the Last Year:  Not on file  . Ran Out of Food in the Last Year: Not on file  Transportation Needs:   . Lack of Transportation (Medical): Not on file  . Lack of Transportation (Non-Medical): Not on file  Physical Activity:   . Days of Exercise per Week: Not on file  . Minutes of Exercise per Session: Not on file  Stress:   . Feeling of Stress : Not on file  Social Connections:   . Frequency of Communication with Friends and Family: Not on file  . Frequency of Social Gatherings with Friends and Family: Not on file  . Attends Religious Services: Not on file  . Active Member of Clubs or Organizations: Not on file  . Attends Archivist Meetings: Not on file  . Marital Status: Not on file  Intimate Partner Violence:   . Fear of Current or Ex-Partner: Not on file  . Emotionally Abused: Not on file  . Physically Abused: Not on file  . Sexually Abused: Not on  file    Review of Systems  All other systems reviewed and are negative.      Objective:   Physical Exam Constitutional:      General: He is not in acute distress.    Appearance: Normal appearance. He is normal weight. He is not ill-appearing or toxic-appearing.  Cardiovascular:     Rate and Rhythm: Normal rate and regular rhythm.     Heart sounds: Murmur present.  Pulmonary:     Effort: Pulmonary effort is normal. No respiratory distress.     Breath sounds: Normal breath sounds. No stridor. No wheezing, rhonchi or rales.  Abdominal:     General: Abdomen is flat. Bowel sounds are normal. There is no distension.     Palpations: Abdomen is soft.     Tenderness: There is no abdominal tenderness. There is no guarding.  Musculoskeletal:     Right lower leg: No edema.     Left lower leg: No edema.  Neurological:     Mental Status: He is alert.           Assessment & Plan:  Hospital discharge follow-up - Plan: CBC with Differential, COMPLETE METABOLIC PANEL WITH GFR  Rupture of appendix - Plan: CBC with Differential,  COMPLETE METABOLIC PANEL WITH GFR  H/O mitral valve replacement with mechanical valve - Plan: CBC with Differential, COMPLETE METABOLIC PANEL WITH GFR  Paroxysmal atrial fibrillation (HCC) - Plan: CBC with Differential, COMPLETE METABOLIC PANEL WITH GFR  Weight loss - Plan: TSH  Patient has lost substantial weight.  He is concerned by this.  I believe this because of the illness that he has suffered through recently.  However I will check a TSH to rule out hypothyroidism.  If he continues to lose weight, I would recommend a work-up for malignancy however at the present time I reassured the patient that this should improve on its own as he continues to recover from his recent infection.  Follow-up as planned with general surgery for an elective appendectomy.  Patient will require heparin bridge and discontinuation of Coumadin prior to surgery.  I recommended that he discuss this with his general surgeon as well as his cardiologist so that this can be scheduled around the time of surgery.  Recheck CBC today to rule out any anemia or evidence of leukocytosis.  Monitor electrolytes with a CMP.

## 2019-04-02 LAB — TSH: TSH: 1.2 mIU/L (ref 0.40–4.50)

## 2019-04-02 LAB — CBC WITH DIFFERENTIAL/PLATELET
Absolute Monocytes: 740 cells/uL (ref 200–950)
Basophils Absolute: 60 cells/uL (ref 0–200)
Basophils Relative: 0.7 %
Eosinophils Absolute: 138 cells/uL (ref 15–500)
Eosinophils Relative: 1.6 %
HCT: 37.1 % — ABNORMAL LOW (ref 38.5–50.0)
Hemoglobin: 12.3 g/dL — ABNORMAL LOW (ref 13.2–17.1)
Lymphs Abs: 834 cells/uL — ABNORMAL LOW (ref 850–3900)
MCH: 30.4 pg (ref 27.0–33.0)
MCHC: 33.2 g/dL (ref 32.0–36.0)
MCV: 91.6 fL (ref 80.0–100.0)
MPV: 11.3 fL (ref 7.5–12.5)
Monocytes Relative: 8.6 %
Neutro Abs: 6828 cells/uL (ref 1500–7800)
Neutrophils Relative %: 79.4 %
Platelets: 288 10*3/uL (ref 140–400)
RBC: 4.05 10*6/uL — ABNORMAL LOW (ref 4.20–5.80)
RDW: 12.7 % (ref 11.0–15.0)
Total Lymphocyte: 9.7 %
WBC: 8.6 10*3/uL (ref 3.8–10.8)

## 2019-04-02 LAB — COMPLETE METABOLIC PANEL WITH GFR
AG Ratio: 1.2 (calc) (ref 1.0–2.5)
ALT: 38 U/L (ref 9–46)
AST: 24 U/L (ref 10–35)
Albumin: 3.2 g/dL — ABNORMAL LOW (ref 3.6–5.1)
Alkaline phosphatase (APISO): 91 U/L (ref 35–144)
BUN: 14 mg/dL (ref 7–25)
CO2: 29 mmol/L (ref 20–32)
Calcium: 9.1 mg/dL (ref 8.6–10.3)
Chloride: 101 mmol/L (ref 98–110)
Creat: 0.83 mg/dL (ref 0.70–1.25)
GFR, Est African American: 109 mL/min/{1.73_m2} (ref >=60)
GFR, Est Non African American: 94 mL/min/{1.73_m2} (ref >=60)
Globulin: 2.6 g/dL (calc) (ref 1.9–3.7)
Glucose, Bld: 153 mg/dL — ABNORMAL HIGH (ref 65–99)
Potassium: 4.3 mmol/L (ref 3.5–5.3)
Sodium: 141 mmol/L (ref 135–146)
Total Bilirubin: 0.5 mg/dL (ref 0.2–1.2)
Total Protein: 5.8 g/dL — ABNORMAL LOW (ref 6.1–8.1)

## 2019-04-02 LAB — TEST AUTHORIZATION

## 2019-04-02 LAB — HEMOGLOBIN A1C W/OUT EAG: Hgb A1c MFr Bld: 5.9 % of total Hgb — ABNORMAL HIGH (ref <5.7)

## 2019-04-21 ENCOUNTER — Ambulatory Visit: Payer: Self-pay | Admitting: General Surgery

## 2019-04-21 NOTE — H&P (View-Only) (Signed)
[name] Documented: 04/14/2019 9:07 AM Location: [facility] Patient #: [id number] DOB: 01/13/1956 Married / Language: [name] / Race: Gregory Male   History of Present Illness [name] M. Gregory Kneeland MD; 04/14/2019 9:38 AM) The patient is a 64 year old male who presents with appendicitis. He comes in for hospital follow-up after being admitted from December 14 through the 19th for ruptured appendicitis with a small abscess not amendable to percutaneous drainage. He had a fecalith in his appendix. He is also on chronic anticoagulation specifically Coumadin given his mechanical cardiac valves. He was placed on an IV heparin drip on the hospital. He was discharged on a Lovenox Coumadin bridge. His cardiologist is Dr. Alfonso [name] with [facility]. He follows in their Coumadin clinic. He states he is doing well. He has seen his primary care doctor at the end of December. He has completed his antibiotic course. He is gaining weight. He reports a good appetite. He denies any nausea, vomiting, diarrhea or constipation. He denies any chest pain, chest pressure, shortness of breath, dyspnea on exertion. No prior abdominal surgery.   Problem List/Past Medical [name] M. Gregory Pulling, MD; 04/14/2019 9:44 AM) CAD, MULTIPLE VESSEL (I25.10)  MECHANICAL HEART VALVE PRESENT (Z95.2)  RUPTURED APPENDICITIS (K35.32)  ANTICOAGULATED ON COUMADIN (Z79.01)   Past Surgical History [name] M. Gregory Pulling, MD; 04/14/2019 9:44 AM) Valve Replacement   Diagnostic Studies History [name] M. Gregory Pulling, MD; 04/14/2019 9:44 AM) Colonoscopy  never  Allergies ([name], [name]; 04/14/2019 9:07 AM) No Known Drug Allergies [04/14/2019]: Allergies Reconciled   Medication History ([name], [name]; 04/14/2019 9:08 AM) Aspirin Low Dose (81MG  Tablet DR, Oral) Active. Atorvastatin Calcium (80MG  Tablet, Oral) Active. Carvedilol (3.125MG  Tablet, Oral) Active. Enalapril Maleate (5MG  Tablet, Oral)  Active. Warfarin Sodium (5MG  Tablet, Oral) Active. Warfarin Sodium (7.5MG  Tablet, Oral) Active. Medications Reconciled  Social History [name] M. Gregory Pulling, MD; 04/14/2019 9:44 AM) Alcohol use  Occasional alcohol use. Caffeine use  Carbonated beverages, Coffee, Tea. No drug use  Tobacco use  Former smoker.  Family History [name] M. Gregory Pulling, MD; 04/14/2019 9:44 AM) Breast Cancer  Mother. Cerebrovascular Accident  Father. Colon Cancer  Mother. Heart Disease  Father. Heart disease in male family member before age 41  Malignant Neoplasm Of Pancreas  Mother. Respiratory Condition  Mother.  Other Problems [name] M. Gregory Pulling, MD; 04/14/2019 9:44 AM) Atrial Fibrillation  Back Pain  Cerebrovascular Accident  Gastroesophageal Reflux Disease  Heart murmur  Myocardial infarction  Vascular Disease     Review of Systems [name] M. Gregory Cervini MD; 04/14/2019 9:44 AM) General Not Present- Appetite Loss, Chills, Fatigue, Fever, Night Sweats, Weight Gain and Weight Loss. Skin Not Present- Change in Wart/Mole, Dryness, Hives, Jaundice, New Lesions, Non-Healing Wounds, Rash and Ulcer. HEENT Present- Wears glasses/contact lenses. Not Present- Earache, Hearing Loss, Hoarseness, Nose Bleed, Oral Ulcers, Ringing in the Ears, Seasonal Allergies, Sinus Pain, Sore Throat, Visual Disturbances and Yellow Eyes. Respiratory Not Present- Bloody sputum, Chronic Cough, Difficulty Breathing, Snoring and Wheezing. Breast Not Present- Breast Mass, Breast Pain, Nipple Discharge and Skin Changes. Cardiovascular Present- Shortness of Breath and Swelling of Extremities. Not Present- Chest Pain, Difficulty Breathing Lying Down, Leg Cramps, Palpitations and Rapid Heart Rate. Gastrointestinal Not Present- Abdominal Pain, Bloating, Bloody Stool, Change in Bowel Habits, Chronic diarrhea, Constipation, Difficulty Swallowing, Excessive gas, Gets full quickly at meals, Hemorrhoids, Indigestion, Nausea, Rectal Pain and  Vomiting. Male Genitourinary Not Present- Blood in Urine, Change in Urinary Stream, Frequency, Impotence, Nocturia, Painful Urination, Urgency and Urine Leakage. Musculoskeletal Not  Present- Back Pain, Joint Pain, Joint Stiffness, Muscle Pain, Muscle Weakness and Swelling of Extremities. Neurological Not Present- Decreased Memory, Fainting, Headaches, Numbness, Seizures, Tingling, Tremor, Trouble walking and Weakness. Psychiatric Not Present- Anxiety, Bipolar, Change in Sleep Pattern, Depression, Fearful and Frequent crying. Endocrine Present- Cold Intolerance and Heat Intolerance. Not Present- Excessive Hunger, Hair Changes, Hot flashes and New Diabetes. Hematology Present- Blood Thinners. Not Present- Easy Bruising, Excessive bleeding, Gland problems, HIV and Persistent Infections.  Vitals ([name]; 04/14/2019 9:09 AM) 04/14/2019 9:08 AM Weight: 204.6 lb Height: 68in Body Surface Area: 2.06 m Body Mass Index: 31.11 kg/m  Temp.: 98.3F  Pulse: 81 (Regular)  BP: 132/84 (Sitting, Left Arm, Standard)       Physical Exam [name] M. Gregory Etheridge MD; 04/14/2019 9:39 AM) General Mental Status-Alert. General Appearance-Consistent with stated age. Hydration-Well hydrated. Voice-Normal.  Head and Neck Head-normocephalic, atraumatic with no lesions or palpable masses. Trachea-midline. Thyroid Gland Characteristics - normal size and consistency.  Eye Eyeball - Bilateral-Extraocular movements intact. Sclera/Conjunctiva - Bilateral-No scleral icterus.  Chest and Lung Exam Chest and lung exam reveals -quiet, even and easy respiratory effort with no use of accessory muscles and on auscultation, normal breath sounds, no adventitious sounds and normal vocal resonance. Inspection Chest Wall - Normal. Back - normal. Note: old sternotomy incision   Breast - Did not examine.  Cardiovascular Cardiovascular examination reveals -normal heart sounds, regular  rate and rhythm with no murmurs and normal pedal pulses bilaterally.  Abdomen Inspection Inspection of the abdomen reveals - No Hernias. Skin - Scar - no surgical scars. Palpation/Percussion Palpation and Percussion of the abdomen reveal - Soft, Non Tender, No Rebound tenderness, No Rigidity (guarding) and No hepatosplenomegaly. Auscultation Auscultation of the abdomen reveals - Bowel sounds normal.  Peripheral Vascular Upper Extremity Palpation - Pulses bilaterally normal.  Neurologic Neurologic evaluation reveals -alert and oriented x 3 with no impairment of recent or remote memory. Mental Status-Normal.  Neuropsychiatric The patient's mood and affect are described as -normal. Judgment and Insight-insight is appropriate concerning matters relevant to self.  Musculoskeletal Normal Exam - Left-Upper Extremity Strength Normal and Lower Extremity Strength Normal. Normal Exam - Right-Upper Extremity Strength Normal and Lower Extremity Strength Normal.  Lymphatic Head & Neck  General Head & Neck Lymphatics: Bilateral - Description - Normal. Axillary - Did not examine. Femoral & Inguinal - Did not examine.    Assessment & Plan [name] M. Valdez Brannan MD; 04/14/2019 9:46 AM) RUPTURED APPENDICITIS (K35.32) Impression: This is a moderately complex patient. He has a history of ruptured appendicitis with small abscess, coronary artery disease, mitral valve replacement on chronic Coumadin. I have recommended interval appendectomy given his severe appendicitis especially since he also has a fecalith. We discussed the potential for recurrence is up to 35%. He would like to proceed with appendectomy. I discussed the risk and benefits including but not limited to bleeding, infection, injury to surrounding structures, need to convert to an open procedure, staple line leak, hernia formation, blood clot formation, perioperative cardiac and pulmonary events, potential risk of stroke while off  systemic anticoagulation, potential ileocecectomy. We discussed the typical recovery period. I will order a repeat CT and pelvis to reevaluate the appendix and surrounding tissues to make sure that the severe inflammation has resolved prior to surgery.  I reviewed his hospital discharge summary, his PCP note from December 29, his cardiologist last note as well as the pharmacist managing his Coumadin last visit encounter. Also reviewed his CT scan from when he was  admitted.  I did discuss that he is at higher/moderate risk for complications because of his anticoagulation as well as the fact that he has had ruptured appendicitis with abscess. Current Plans Pt Education - Pamphlet Given - Laparoscopic Colorectal Surgery: discussed with patient and provided information. Pt Education - CCS Free Text Education/Instructions: discussed with patient and provided information. ANTICOAGULATED ON COUMADIN (Z79.01) Impression: We will need to obtain cardiac clearance from Dr. Alfonso [name] at [facility]. We will also need to get them to manage his Coumadin perioperatively. I anticipate that he will be able to go on Lovenox bridge around the time of surgery. MECHANICAL HEART VALVE PRESENT (Z95.2) CAD, MULTIPLE VESSEL (I25.10)  Gregory Ruff. Gregory Pulling, MD, FACS General, Bariatric, & Minimally Invasive Surgery [facility], [place]

## 2019-04-21 NOTE — H&P (Signed)
[name] Documented: 04/14/2019 9:07 AM Location: [facility] Patient #: [id number] DOB: 1956/02/13 Married / Language: [name] / Race: White Male   History of Present Illness Gregory Hiss M. Gregory Pester MD; 04/14/2019 9:38 AM) The patient is a 64 year old male who presents with appendicitis. He comes in for hospital follow-up after being admitted from December 14 through the 19th for ruptured appendicitis with a small abscess not amendable to percutaneous drainage. He had a fecalith in his appendix. He is also on chronic anticoagulation specifically Coumadin given his mechanical cardiac valves. He was placed on an IV heparin drip on the hospital. He was discharged on a Lovenox Coumadin bridge. His cardiologist is Dr. Alfonso Ramus with [facility]. He follows in their Coumadin clinic. He states he is doing well. He has seen his primary care doctor at the end of December. He has completed his antibiotic course. He is gaining weight. He reports a good appetite. He denies any nausea, vomiting, diarrhea or constipation. He denies any chest pain, chest pressure, shortness of breath, dyspnea on exertion. No prior abdominal surgery.   Problem List/Past Medical Gregory Hiss M. Redmond Pulling, MD; 04/14/2019 9:44 AM) CAD, MULTIPLE VESSEL (I25.10)  MECHANICAL HEART VALVE PRESENT (Z95.2)  RUPTURED APPENDICITIS (K35.32)  ANTICOAGULATED ON COUMADIN (Z79.01)   Past Surgical History Gregory Hiss M. Redmond Pulling, MD; 04/14/2019 9:44 AM) Valve Replacement   Diagnostic Studies History Gregory Hiss M. Redmond Pulling, MD; 04/14/2019 9:44 AM) Colonoscopy  never  Allergies ([name], [place]; 04/14/2019 9:07 AM) No Known Drug Allergies [04/14/2019]: Allergies Reconciled   Medication History ([name], [name]; 04/14/2019 9:08 AM) Aspirin Low Dose (81MG  Tablet DR, Oral) Active. Atorvastatin Calcium (80MG  Tablet, Oral) Active. Carvedilol (3.125MG  Tablet, Oral) Active. Enalapril Maleate (5MG  Tablet, Oral)  Active. Warfarin Sodium (5MG  Tablet, Oral) Active. Warfarin Sodium (7.5MG  Tablet, Oral) Active. Medications Reconciled  Social History Gregory Hiss M. Redmond Pulling, MD; 04/14/2019 9:44 AM) Alcohol use  Occasional alcohol use. Caffeine use  Carbonated beverages, Coffee, Tea. No drug use  Tobacco use  Former smoker.  Family History Gregory Hiss M. Redmond Pulling, MD; 04/14/2019 9:44 AM) Breast Cancer  Mother. Cerebrovascular Accident  Father. Colon Cancer  Mother. Heart Disease  Father. Heart disease in male family member before age 61  Malignant Neoplasm Of Pancreas  Mother. Respiratory Condition  Mother.  Other Problems Gregory Hiss M. Redmond Pulling, MD; 04/14/2019 9:44 AM) Atrial Fibrillation  Back Pain  Cerebrovascular Accident  Gastroesophageal Reflux Disease  Heart murmur  Myocardial infarction  Vascular Disease     Review of Systems Gregory Hiss M. Shephanie Romas MD; 04/14/2019 9:44 AM) General Not Present- Appetite Loss, Chills, Fatigue, Fever, Night Sweats, Weight Gain and Weight Loss. Skin Not Present- Change in Wart/Mole, Dryness, Hives, Jaundice, New Lesions, Non-Healing Wounds, Rash and Ulcer. HEENT Present- Wears glasses/contact lenses. Not Present- Earache, Hearing Loss, Hoarseness, Nose Bleed, Oral Ulcers, Ringing in the Ears, Seasonal Allergies, Sinus Pain, Sore Throat, Visual Disturbances and Yellow Eyes. Respiratory Not Present- Bloody sputum, Chronic Cough, Difficulty Breathing, Snoring and Wheezing. Breast Not Present- Breast Mass, Breast Pain, Nipple Discharge and Skin Changes. Cardiovascular Present- Shortness of Breath and Swelling of Extremities. Not Present- Chest Pain, Difficulty Breathing Lying Down, Leg Cramps, Palpitations and Rapid Heart Rate. Gastrointestinal Not Present- Abdominal Pain, Bloating, Bloody Stool, Change in Bowel Habits, Chronic diarrhea, Constipation, Difficulty Swallowing, Excessive gas, Gets full quickly at meals, Hemorrhoids, Indigestion, Nausea, Rectal Pain and  Vomiting. Male Genitourinary Not Present- Blood in Urine, Change in Urinary Stream, Frequency, Impotence, Nocturia, Painful Urination, Urgency and Urine Leakage. Musculoskeletal Not  Present- Back Pain, Joint Pain, Joint Stiffness, Muscle Pain, Muscle Weakness and Swelling of Extremities. Neurological Not Present- Decreased Memory, Fainting, Headaches, Numbness, Seizures, Tingling, Tremor, Trouble walking and Weakness. Psychiatric Not Present- Anxiety, Bipolar, Change in Sleep Pattern, Depression, Fearful and Frequent crying. Endocrine Present- Cold Intolerance and Heat Intolerance. Not Present- Excessive Hunger, Hair Changes, Hot flashes and New Diabetes. Hematology Present- Blood Thinners. Not Present- Easy Bruising, Excessive bleeding, Gland problems, HIV and Persistent Infections.  Vitals (Gregory A. Brown RMA; 04/14/2019 9:09 AM) 04/14/2019 9:08 AM Weight: 204.6 lb Height: 68in Body Surface Area: 2.06 m Body Mass Index: 31.11 kg/m  Temp.: 98.52F  Pulse: 81 (Regular)  BP: 132/84 (Sitting, Left Arm, Standard)       Physical Exam Gregory Hiss M. Myking Sar MD; 04/14/2019 9:39 AM) General Mental Status-Alert. General Appearance-Consistent with stated age. Hydration-Well hydrated. Voice-Normal.  Head and Neck Head-normocephalic, atraumatic with no lesions or palpable masses. Trachea-midline. Thyroid Gland Characteristics - normal size and consistency.  Eye Eyeball - Bilateral-Extraocular movements intact. Sclera/Conjunctiva - Bilateral-No scleral icterus.  Chest and Lung Exam Chest and lung exam reveals -quiet, even and easy respiratory effort with no use of accessory muscles and on auscultation, normal breath sounds, no adventitious sounds and normal vocal resonance. Inspection Chest Wall - Normal. Back - normal. Note: old sternotomy incision   Breast - Did not examine.  Cardiovascular Cardiovascular examination reveals -normal heart sounds, regular  rate and rhythm with no murmurs and normal pedal pulses bilaterally.  Abdomen Inspection Inspection of the abdomen reveals - No Hernias. Skin - Scar - no surgical scars. Palpation/Percussion Palpation and Percussion of the abdomen reveal - Soft, Non Tender, No Rebound tenderness, No Rigidity (guarding) and No hepatosplenomegaly. Auscultation Auscultation of the abdomen reveals - Bowel sounds normal.  Peripheral Vascular Upper Extremity Palpation - Pulses bilaterally normal.  Neurologic Neurologic evaluation reveals -alert and oriented x 3 with no impairment of recent or remote memory. Mental Status-Normal.  Neuropsychiatric The patient's mood and affect are described as -normal. Judgment and Insight-insight is appropriate concerning matters relevant to self.  Musculoskeletal Normal Exam - Left-Upper Extremity Strength Normal and Lower Extremity Strength Normal. Normal Exam - Right-Upper Extremity Strength Normal and Lower Extremity Strength Normal.  Lymphatic Head & Neck  General Head & Neck Lymphatics: Bilateral - Description - Normal. Axillary - Did not examine. Femoral & Inguinal - Did not examine.    Assessment & Plan Gregory Hiss M. Glendon Fiser MD; 04/14/2019 9:46 AM) RUPTURED APPENDICITIS (K35.32) Impression: This is a moderately complex patient. He has a history of ruptured appendicitis with small abscess, coronary artery disease, mitral valve replacement on chronic Coumadin. I have recommended interval appendectomy given his severe appendicitis especially since he also has a fecalith. We discussed the potential for recurrence is up to 35%. He would like to proceed with appendectomy. I discussed the risk and benefits including but not limited to bleeding, infection, injury to surrounding structures, need to convert to an open procedure, staple line leak, hernia formation, blood clot formation, perioperative cardiac and pulmonary events, potential risk of stroke while off  systemic anticoagulation, potential ileocecectomy. We discussed the typical recovery period. I will order a repeat CT and pelvis to reevaluate the appendix and surrounding tissues to make sure that the severe inflammation has resolved prior to surgery.  I reviewed his hospital discharge summary, his PCP note from December 29, his cardiologist last note as well as the pharmacist managing his Coumadin last visit encounter. Also reviewed his CT scan from when he was  admitted.  I did discuss that he is at higher/moderate risk for complications because of his anticoagulation as well as the fact that he has had ruptured appendicitis with abscess. Current Plans Pt Education - Pamphlet Given - Laparoscopic Colorectal Surgery: discussed with patient and provided information. Pt Education - CCS Free Text Education/Instructions: discussed with patient and provided information. ANTICOAGULATED ON COUMADIN (Z79.01) Impression: We will need to obtain cardiac clearance from Dr. Alfonso Ramus at [facility]. We will also need to get them to manage his Coumadin perioperatively. I anticipate that he will be able to go on Lovenox bridge around the time of surgery. MECHANICAL HEART VALVE PRESENT (Z95.2) CAD, MULTIPLE VESSEL (I25.10)  Leighton Ruff. Redmond Pulling, MD, FACS General, Bariatric, & Minimally Invasive Surgery [facility], [place]

## 2019-04-24 ENCOUNTER — Other Ambulatory Visit: Payer: Self-pay | Admitting: General Surgery

## 2019-04-24 DIAGNOSIS — R1031 Right lower quadrant pain: Secondary | ICD-10-CM

## 2019-04-24 DIAGNOSIS — K3532 Acute appendicitis with perforation and localized peritonitis, without abscess: Secondary | ICD-10-CM

## 2019-05-03 ENCOUNTER — Ambulatory Visit
Admission: RE | Admit: 2019-05-03 | Discharge: 2019-05-03 | Disposition: A | Discharge status: Home / Self Care | Payer: 59 | Source: Ambulatory Visit | Attending: General Surgery | Admitting: General Surgery

## 2019-05-03 ENCOUNTER — Other Ambulatory Visit: Payer: Self-pay

## 2019-05-03 DIAGNOSIS — R1031 Right lower quadrant pain: Secondary | ICD-10-CM

## 2019-05-03 DIAGNOSIS — K3532 Acute appendicitis with perforation and localized peritonitis, without abscess: Secondary | ICD-10-CM

## 2019-05-03 MED ORDER — IOPAMIDOL (ISOVUE-300) INJECTION 61%
100.0000 mL | Freq: Once | INTRAVENOUS | Status: AC | PRN
  Administered 2019-05-03: 100 mL via INTRAVENOUS

## 2019-05-07 NOTE — Patient Instructions (Addendum)
DUE TO COVID-19 ONLY ONE VISITOR IS ALLOWED TO COME WITH YOU AND STAY IN THE WAITING ROOM ONLY DURING PRE OP AND PROCEDURE DAY OF SURGERY. THE 1 VISITOR MAY VISIT WITH YOU AFTER SURGERY IN YOUR PRIVATE ROOM DURING VISITING HOURS ONLY!  YOU NEED TO HAVE A COVID 19 TEST ON_Thursday 02/11/2021______ @_11 :30 am______, THIS TEST MUST BE DONE BEFORE SURGERY, COME  [address] [place] , [id number].  ([place]) ONCE YOUR COVID TEST IS COMPLETED, PLEASE BEGIN THE QUARANTINE INSTRUCTIONS AS OUTLINED IN YOUR HANDOUT.                Hazen Plowden     Your procedure is scheduled on: Monday 05/17/2019   Report to [facility] Main  Entrance    Report to admitting at  0730  AM     Call this number if you have problems the morning of surgery [phone]    Remember: Do not eat food or drink liquids :After Midnight.     BRUSH YOUR TEETH MORNING OF SURGERY AND RINSE YOUR MOUTH OUT, NO CHEWING GUM CANDY OR MINTS.     Take these medicines the morning of surgery with A SIP OF WATER: Carvedilol (Coreg)                                 You may not have any metal on your body including hair pins and              piercings  Do not wear jewelry, make-up, lotions, powders or perfumes, deodorant                         Men may shave face and neck.   Do not bring valuables to the hospital. [place].  Contacts, dentures or bridgework may not be worn into surgery.  Leave suitcase in the car. After surgery it may be brought to your room.     Patients discharged the day of surgery will not be allowed to drive home. IF YOU ARE HAVING SURGERY AND GOING HOME THE SAME DAY, YOU MUST HAVE AN ADULT TO DRIVE YOU HOME AND  BE WITH YOU FOR 24 HOURS. YOU MAY GO HOME BY TAXI OR UBER OR ORTHERWISE, BUT AN ADULT MUST ACCOMPANY YOU HOME AND STAY WITH YOU FOR 24 HOURS.  Name and phone number of your driver: spouse- Gae Bon   [phone]                Please read over the following fact sheets you were given: _____________________________________________________________________             [facility] - Preparing for Surgery Before surgery, you can play an important role.  Because skin is not sterile, your skin needs to be as free of germs as possible.  You can reduce the number of germs on your skin by washing with CHG (chlorahexidine gluconate) soap before surgery.  CHG is an antiseptic cleaner which kills germs and bonds with the skin to continue killing germs even after washing. Please DO NOT use if you have an allergy to CHG or antibacterial soaps.  If your skin becomes reddened/irritated stop using the CHG and inform your nurse when you arrive at Short Stay. Do not shave (including legs and underarms)  for at least 48 hours prior to the first CHG shower.  You may shave your face/neck. Please follow these instructions carefully:  1.  Shower with CHG Soap the night before surgery and the  morning of Surgery.  2.  If you choose to wash your hair, wash your hair first as usual with your  normal  shampoo.  3.  After you shampoo, rinse your hair and body thoroughly to remove the  shampoo.                           4.  Use CHG as you would any other liquid soap.  You can apply chg directly  to the skin and wash                       Gently with a scrungie or clean washcloth.  5.  Apply the CHG Soap to your body ONLY FROM THE NECK DOWN.   Do not use on face/ open                           Wound or open sores. Avoid contact with eyes, ears mouth and genitals (private parts).                       Wash face,  Genitals (private parts) with your normal soap.             6.  Wash thoroughly, paying special attention to the area where your surgery  will be performed.  7.  Thoroughly rinse your body with warm water from the neck down.  8.  DO NOT shower/wash with your normal soap after using and rinsing off  the CHG  Soap.                9.  Pat yourself dry with a clean towel.            10.  Wear clean pajamas.            11.  Place clean sheets on your bed the night of your first shower and do not  sleep with pets. Day of Surgery : Do not apply any lotions/deodorants the morning of surgery.  Please wear clean clothes to the hospital/surgery center.  FAILURE TO FOLLOW THESE INSTRUCTIONS MAY RESULT IN THE CANCELLATION OF YOUR SURGERY PATIENT SIGNATURE_________________________________  NURSE SIGNATURE__________________________________  ________________________________________________________________________

## 2019-05-11 ENCOUNTER — Other Ambulatory Visit: Payer: Self-pay

## 2019-05-11 ENCOUNTER — Encounter (HOSPITAL_COMMUNITY): Payer: Self-pay

## 2019-05-11 ENCOUNTER — Encounter (HOSPITAL_COMMUNITY)
Admission: RE | Admit: 2019-05-11 | Discharge: 2019-05-11 | Disposition: A | Discharge status: Home / Self Care | Payer: 59 | Source: Ambulatory Visit | Attending: General Surgery | Admitting: General Surgery

## 2019-05-11 DIAGNOSIS — Z01812 Encounter for preprocedural laboratory examination: Secondary | ICD-10-CM | POA: Insufficient documentation

## 2019-05-11 HISTORY — DX: Essential (primary) hypertension: I10

## 2019-05-11 LAB — CBC WITH DIFFERENTIAL/PLATELET
Abs Immature Granulocytes: 0.02 10*3/uL (ref 0.00–0.07)
Basophils Absolute: 0 10*3/uL (ref 0.0–0.1)
Basophils Relative: 1 %
Eosinophils Absolute: 0.1 10*3/uL (ref 0.0–0.5)
Eosinophils Relative: 2 %
HCT: 42.7 % (ref 39.0–52.0)
Hemoglobin: 13.1 g/dL (ref 13.0–17.0)
Immature Granulocytes: 0 %
Lymphocytes Relative: 12 %
Lymphs Abs: 0.8 10*3/uL (ref 0.7–4.0)
MCH: 30 pg (ref 26.0–34.0)
MCHC: 30.7 g/dL (ref 30.0–36.0)
MCV: 97.9 fL (ref 80.0–100.0)
Monocytes Absolute: 0.8 10*3/uL (ref 0.1–1.0)
Monocytes Relative: 12 %
Neutro Abs: 4.9 10*3/uL (ref 1.7–7.7)
Neutrophils Relative %: 73 %
Platelets: 178 10*3/uL (ref 150–400)
RBC: 4.36 MIL/uL (ref 4.22–5.81)
RDW: 14.4 % (ref 11.5–15.5)
WBC: 6.6 10*3/uL (ref 4.0–10.5)
nRBC: 0 % (ref 0.0–0.2)

## 2019-05-11 LAB — COMPREHENSIVE METABOLIC PANEL
ALT: 25 U/L (ref 0–44)
AST: 22 U/L (ref 15–41)
Albumin: 3.9 g/dL (ref 3.5–5.0)
Alkaline Phosphatase: 87 U/L (ref 38–126)
Anion gap: 7 (ref 5–15)
BUN: 20 mg/dL (ref 8–23)
CO2: 30 mmol/L (ref 22–32)
Calcium: 9.6 mg/dL (ref 8.9–10.3)
Chloride: 104 mmol/L (ref 98–111)
Creatinine, Ser: 0.95 mg/dL (ref 0.61–1.24)
GFR calc Af Amer: >60 mL/min (ref >60)
GFR calc non Af Amer: >60 mL/min (ref >60)
Glucose, Bld: 118 mg/dL — ABNORMAL HIGH (ref 70–99)
Potassium: 4.2 mmol/L (ref 3.5–5.1)
Sodium: 141 mmol/L (ref 135–145)
Total Bilirubin: 0.8 mg/dL (ref 0.3–1.2)
Total Protein: 6.9 g/dL (ref 6.5–8.1)

## 2019-05-11 LAB — PROTIME-INR
INR: 2.5 — ABNORMAL HIGH (ref 0.8–1.2)
Prothrombin Time: 26.9 seconds — ABNORMAL HIGH (ref 11.4–15.2)

## 2019-05-11 LAB — APTT: aPTT: 61 seconds — ABNORMAL HIGH (ref 24–36)

## 2019-05-11 NOTE — Progress Notes (Signed)
PCP - Dr. Jenna Luo  LOV 03/30/2019 epic Cardiologist - Dr. Vicente Masson  LOV-05/06/2019  With clearance epic   Chest x-ray -03/15/2019 1 view epic  EKG - 03/17/2019 epic Stress Test - n/a ECHO - 05/06/2019 epic Cardiac Cath - 10/2017  With stent epic  Sleep Study - n/a CPAP - n/a  Fasting Blood Sugar - n/a Checks Blood Sugar _0____ times a day  Blood Thinner Instructions:Coumadin. To stop it 05/11/2019 at bedtime (Last dose). To bridge with Lovenox until Saturday 05/15/2019 at 10 pm (last dose of Lovenox) Aspirin Instructions:aspirin 81 mg.Per patient ,he is going to call the Pharmacist that regulates his Coumadin to ask about when to stop the Aspirin. Last Dose:of Coumadin is 05/11/2019 bedtime  Anesthesia review:  Chart to be reviewed by Konrad Felix, Pa.  Patient has a history of rheumatic heart disease, CAD, S/p inferior stemi S/P DES to RCA 10/2017, PVD, Atrial fibrillation, HTN, COPD,  andAortic valve replacement in 2013  Patient denies shortness of breath, fever, cough and chest pain at PAT appointment   Patient verbalized understanding of instructions that were given to them at the PAT appointment. Patient was also instructed that they will need to review over the PAT instructions again at home before surgery.

## 2019-05-12 LAB — ABO/RH: ABO/RH(D): O POS

## 2019-05-12 NOTE — Progress Notes (Signed)
Anesthesia Chart Review   Case: [id number] Date/Time: 05/17/19 0915   Procedure: INTERVAL LAPAROSCOPIC APPENDECTOMY (N/A )   Anesthesia type: General   Pre-op diagnosis: HISTORY OF RUPTURED APPENDICITIS   Location: [facility] ROOM 01 / [facility]   Surgeons: Greer Pickerel, MD      DISCUSSION:63 y.o. former smoker (quit 04/01/19) with h/o COPD, CAD (DES to RCA 10/2017), HTN, s/p MAZE and Aortic/Mitral valve replacement 2013, atrial fibrillation (on Coumadin), ruptured appendicitis scheduled for above procedure 05/17/19 with Dr. Greer Pickerel.   On Coumadin, advised to hold 05/11/19 with Lovenox bridge, pt given instructions.    Pt last seen by PCP, Dr. Jenna Luo, 03/30/2019.  Per OV note, "Patient has done remarkably well since discharge from the hospital.  He denies any further abdominal pain.  He denies any fevers or chills.  His bowel movements have regulated.  He denies any constipation.  He denies any nausea or vomiting.  He denies any difficulty going to the restroom.  He has no leg swelling.  He denies any shortness of breath.  He denies any dyspnea on exertion.  He denies any pleurisy.  He denies any hemoptysis.  He is back on his Coumadin and just had his Coumadin checked this morning.  His INR was 2.6.  He is no longer on Lovenox bridge.  He is not requiring any medication for pain.  Overall he is doing well with no concerns."  Cleared by cardiology, clearance on chart.   Anticipate pt can proceed with planned procedure barring acute status change.   VS: BP (!) 126/59   Pulse 86   Temp 37 C (Oral)   Resp 16   Ht 5\' 9"  (1.753 m)   Wt 95.3 kg   SpO2 99%   BMI 31.01 kg/m   PROVIDERS: [name], MD is PCP   Vicente Masson, MD is Cardiologist  LABS: Labs reviewed: Acceptable for surgery. (all labs ordered are listed, but only abnormal results are displayed)  Labs Reviewed  APTT - Abnormal; Notable for the following components:      Result Value   aPTT 61 (*)    All other  components within normal limits  COMPREHENSIVE METABOLIC PANEL - Abnormal; Notable for the following components:   Glucose, Bld 118 (*)    All other components within normal limits  PROTIME-INR - Abnormal; Notable for the following components:   Prothrombin Time 26.9 (*)    INR 2.5 (*)    All other components within normal limits  CBC WITH DIFFERENTIAL/PLATELET  TYPE AND SCREEN     IMAGES:   EKG: 03/17/2019 Rate 86 bpm Sinus rhythm   CV: Echo 05/06/2019 SUMMARY  The left ventricular size is normal.  There is normal left ventricular wall thickness.  Left ventricular systolic function is normal.  LV ejection fraction = 60-65%.  The right ventricle is normal in size and function.  The left atrium is mildly dilated.  There is a mechanical aortic valve.  The prosthetic aortic valve is well-seated with normal function  There is a mechanical mitral valve.  The prosthetic mitral valve is well-seated with normal function.  The IVC is normal in size with an inspiratory collapse of greater then  50%, suggesting normal right atrial pressure.  There is no pericardial effusion.    Compared to the last study dated 10/2017 showing improvement of LV  function and resolution of wall motion abnormalities. This likely  explains mild increase in gradient across AV.  Past Medical History:  Diagnosis Date  . COPD (chronic obstructive pulmonary disease) ([place])   . Coronary artery disease   . Hx of artificial heart valve replacement   . Hypertension   . Mitral insufficiency and aortic stenosis   . PVD (peripheral vascular disease) ([place])   . Rheumatic heart disease     Past Surgical History:  Procedure Laterality Date  . AORTIC AND MITRAL VALVE REPLACEMENT  2013   [facility]?  . CORONARY ANGIOPLASTY WITH STENT PLACEMENT  10/2017  . MAZE  2013   atrial appendage ligation and maze procedure in 2013    MEDICATIONS: . aspirin EC 81 MG tablet  . atorvastatin (LIPITOR) 80 MG  tablet  . carvedilol (COREG) 3.125 MG tablet  . enalapril (VASOTEC) 5 MG tablet  . enoxaparin (LOVENOX) 80 MG/0.8ML injection  . warfarin (COUMADIN) 5 MG tablet  . warfarin (COUMADIN) 7.5 MG tablet   No current facility-administered medications for this encounter.    [name] [facility] Pre-Surgical Testing [phone] 05/12/19  4:32 PM

## 2019-05-13 ENCOUNTER — Other Ambulatory Visit (HOSPITAL_COMMUNITY)
Admission: RE | Admit: 2019-05-13 | Discharge: 2019-05-13 | Disposition: A | Discharge status: Home / Self Care | Payer: 59 | Source: Ambulatory Visit | Attending: General Surgery | Admitting: General Surgery

## 2019-05-13 DIAGNOSIS — Z20822 Contact with and (suspected) exposure to covid-19: Secondary | ICD-10-CM | POA: Insufficient documentation

## 2019-05-13 DIAGNOSIS — Z01812 Encounter for preprocedural laboratory examination: Secondary | ICD-10-CM | POA: Insufficient documentation

## 2019-05-13 LAB — SARS CORONAVIRUS 2 (TAT 6-24 HRS): SARS Coronavirus 2: NEGATIVE

## 2019-05-16 NOTE — Anesthesia Preprocedure Evaluation (Addendum)
Anesthesia Evaluation  Patient identified by MRN, date of birth, ID band Patient awake    Reviewed: Allergy & Precautions, NPO status , Patient's Chart, lab work & pertinent test results  History of Anesthesia Complications Negative for: history of anesthetic complications  Airway Mallampati: II  TM Distance: >3 FB Neck ROM: Full    Dental no notable dental hx. (+) Dental Advisory Given   Pulmonary COPD, former smoker,    Pulmonary exam normal        Cardiovascular hypertension, Pt. on home beta blockers and Pt. on medications + CAD and + Cardiac Stents  Normal cardiovascular exam+ dysrhythmias Atrial Fibrillation   S/P AVR&MVR Echo 05/06/2019 SUMMARY  The left ventricular size is normal.  There is normal left ventricular wall thickness.  Left ventricular systolic function is normal.  LV ejection fraction = 60-65%.  The right ventricle is normal in size and function.  The left atrium is mildly dilated.  There is a mechanical aortic valve.  The prosthetic aortic valve is well-seated with normal function  There is a mechanical mitral valve.  The prosthetic mitral valve is well-seated with normal function.  The IVC is normal in size with an inspiratory collapse of greater then  50%, suggesting normal right atrial pressure.  There is no pericardial effusion.    Compared to the last study dated 10/2017 showing improvement of LV  function and resolution of wall motion abnormalities. This likely  explains mild increase in gradient across AV.    Neuro/Psych negative neurological ROS     GI/Hepatic Neg liver ROS,   Endo/Other  negative endocrine ROS  Renal/GU negative Renal ROS     Musculoskeletal negative musculoskeletal ROS (+)   Abdominal   Peds  Hematology negative hematology ROS (+)   Anesthesia Other Findings Day of surgery medications reviewed with the patient.  Reproductive/Obstetrics                            Anesthesia Physical Anesthesia Plan  ASA: III  Anesthesia Plan: General   Post-op Pain Management:    Induction: Intravenous  PONV Risk Score and Plan: 3 and Ondansetron, Dexamethasone and Midazolam  Airway Management Planned: Oral ETT  Additional Equipment:   Intra-op Plan:   Post-operative Plan: Extubation in OR  Informed Consent: I have reviewed the patients History and Physical, chart, labs and discussed the procedure including the risks, benefits and alternatives for the proposed anesthesia with the patient or authorized representative who has indicated his/her understanding and acceptance.     Dental advisory given  Plan Discussed with: Anesthesiologist and CRNA  Anesthesia Plan Comments:        Anesthesia Quick Evaluation

## 2019-05-17 ENCOUNTER — Observation Stay (HOSPITAL_COMMUNITY)
Admission: RE | Admit: 2019-05-17 | Discharge: 2019-05-18 | Disposition: A | Discharge status: Home / Self Care | Payer: 59 | Attending: General Surgery | Admitting: General Surgery

## 2019-05-17 ENCOUNTER — Ambulatory Visit (HOSPITAL_COMMUNITY): Payer: 59 | Admitting: Physician Assistant

## 2019-05-17 ENCOUNTER — Other Ambulatory Visit: Payer: Self-pay

## 2019-05-17 ENCOUNTER — Encounter (HOSPITAL_COMMUNITY): Payer: Self-pay | Admitting: General Surgery

## 2019-05-17 ENCOUNTER — Ambulatory Visit (HOSPITAL_COMMUNITY): Payer: 59 | Admitting: Registered Nurse

## 2019-05-17 ENCOUNTER — Encounter (HOSPITAL_COMMUNITY)
Admission: RE | Disposition: A | Discharge status: Home / Self Care | Payer: Self-pay | Source: Home / Self Care | Attending: General Surgery

## 2019-05-17 DIAGNOSIS — Z7982 Long term (current) use of aspirin: Secondary | ICD-10-CM | POA: Diagnosis not present

## 2019-05-17 DIAGNOSIS — Z955 Presence of coronary angioplasty implant and graft: Secondary | ICD-10-CM | POA: Diagnosis not present

## 2019-05-17 DIAGNOSIS — I252 Old myocardial infarction: Secondary | ICD-10-CM | POA: Diagnosis not present

## 2019-05-17 DIAGNOSIS — I10 Essential (primary) hypertension: Secondary | ICD-10-CM | POA: Diagnosis not present

## 2019-05-17 DIAGNOSIS — Z79899 Other long term (current) drug therapy: Secondary | ICD-10-CM | POA: Insufficient documentation

## 2019-05-17 DIAGNOSIS — Z87891 Personal history of nicotine dependence: Secondary | ICD-10-CM | POA: Insufficient documentation

## 2019-05-17 DIAGNOSIS — J449 Chronic obstructive pulmonary disease, unspecified: Secondary | ICD-10-CM | POA: Diagnosis not present

## 2019-05-17 DIAGNOSIS — I4891 Unspecified atrial fibrillation: Secondary | ICD-10-CM | POA: Diagnosis not present

## 2019-05-17 DIAGNOSIS — Z7901 Long term (current) use of anticoagulants: Secondary | ICD-10-CM | POA: Diagnosis not present

## 2019-05-17 DIAGNOSIS — I999 Unspecified disorder of circulatory system: Secondary | ICD-10-CM | POA: Diagnosis not present

## 2019-05-17 DIAGNOSIS — Z952 Presence of prosthetic heart valve: Secondary | ICD-10-CM | POA: Insufficient documentation

## 2019-05-17 DIAGNOSIS — K389 Disease of appendix, unspecified: Secondary | ICD-10-CM | POA: Diagnosis not present

## 2019-05-17 DIAGNOSIS — Z6830 Body mass index (BMI) 30.0-30.9, adult: Secondary | ICD-10-CM | POA: Insufficient documentation

## 2019-05-17 DIAGNOSIS — I2510 Atherosclerotic heart disease of native coronary artery without angina pectoris: Secondary | ICD-10-CM | POA: Diagnosis not present

## 2019-05-17 DIAGNOSIS — E669 Obesity, unspecified: Secondary | ICD-10-CM | POA: Diagnosis not present

## 2019-05-17 DIAGNOSIS — Z9049 Acquired absence of other specified parts of digestive tract: Secondary | ICD-10-CM

## 2019-05-17 DIAGNOSIS — K3532 Acute appendicitis with perforation and localized peritonitis, without abscess: Secondary | ICD-10-CM | POA: Diagnosis present

## 2019-05-17 DIAGNOSIS — Z8673 Personal history of transient ischemic attack (TIA), and cerebral infarction without residual deficits: Secondary | ICD-10-CM | POA: Insufficient documentation

## 2019-05-17 HISTORY — PX: LAPAROSCOPIC APPENDECTOMY: SHX408

## 2019-05-17 LAB — TYPE AND SCREEN
ABO/RH(D): O POS
Antibody Screen: NEGATIVE

## 2019-05-17 SURGERY — APPENDECTOMY, LAPAROSCOPIC
Anesthesia: General

## 2019-05-17 MED ORDER — OXYCODONE HCL 5 MG PO TABS: 5.0000 mg | ORAL_TABLET | ORAL | Status: DC | PRN

## 2019-05-17 MED ORDER — CARVEDILOL 3.125 MG PO TABS
3.1250 mg | ORAL_TABLET | Freq: Two times a day (BID) | ORAL | Status: DC
  Administered 2019-05-17 – 2019-05-18 (×2): 3.125 mg via ORAL
  Filled 2019-05-17 (×2): qty 1

## 2019-05-17 MED ORDER — ASPIRIN EC 81 MG PO TBEC: 81.0000 mg | DELAYED_RELEASE_TABLET | Freq: Every day | ORAL | Status: DC

## 2019-05-17 MED ORDER — LIDOCAINE 2% (20 MG/ML) 5 ML SYRINGE
INTRAMUSCULAR | Status: DC | PRN
  Administered 2019-05-17: 80 mg via INTRAVENOUS

## 2019-05-17 MED ORDER — ROCURONIUM BROMIDE 10 MG/ML (PF) SYRINGE
PREFILLED_SYRINGE | INTRAVENOUS | Status: AC
  Filled 2019-05-17: qty 10

## 2019-05-17 MED ORDER — MORPHINE SULFATE (PF) 2 MG/ML IV SOLN: 1.0000 mg | INTRAVENOUS | Status: DC | PRN

## 2019-05-17 MED ORDER — SODIUM CHLORIDE 0.9 % IV SOLN
2.0000 g | INTRAVENOUS | Status: AC
  Administered 2019-05-17: 10:00:00 2 g via INTRAVENOUS
  Filled 2019-05-17: qty 2

## 2019-05-17 MED ORDER — PROMETHAZINE HCL 25 MG/ML IJ SOLN: 6.2500 mg | INTRAMUSCULAR | Status: DC | PRN

## 2019-05-17 MED ORDER — LIDOCAINE 2% (20 MG/ML) 5 ML SYRINGE
INTRAMUSCULAR | Status: AC
  Filled 2019-05-17: qty 5

## 2019-05-17 MED ORDER — BUPIVACAINE HCL (PF) 0.25 % IJ SOLN
INTRAMUSCULAR | Status: DC | PRN
  Administered 2019-05-17: 50 mL

## 2019-05-17 MED ORDER — PROPOFOL 10 MG/ML IV BOLUS
INTRAVENOUS | Status: AC
  Filled 2019-05-17: qty 40

## 2019-05-17 MED ORDER — CHLORHEXIDINE GLUCONATE CLOTH 2 % EX PADS: 6.0000 | MEDICATED_PAD | Freq: Once | CUTANEOUS | Status: DC

## 2019-05-17 MED ORDER — ONDANSETRON HCL 4 MG/2ML IJ SOLN: 4.0000 mg | Freq: Four times a day (QID) | INTRAMUSCULAR | Status: DC | PRN

## 2019-05-17 MED ORDER — FENTANYL CITRATE (PF) 250 MCG/5ML IJ SOLN
INTRAMUSCULAR | Status: DC | PRN
  Administered 2019-05-17: 50 ug via INTRAVENOUS
  Administered 2019-05-17: 100 ug via INTRAVENOUS

## 2019-05-17 MED ORDER — ACETAMINOPHEN 500 MG PO TABS
1000.0000 mg | ORAL_TABLET | Freq: Four times a day (QID) | ORAL | Status: DC
  Filled 2019-05-17: qty 2

## 2019-05-17 MED ORDER — GABAPENTIN 100 MG PO CAPS
200.0000 mg | ORAL_CAPSULE | Freq: Two times a day (BID) | ORAL | Status: DC
  Filled 2019-05-17: qty 2

## 2019-05-17 MED ORDER — DEXAMETHASONE SODIUM PHOSPHATE 10 MG/ML IJ SOLN
INTRAMUSCULAR | Status: DC | PRN
  Administered 2019-05-17: 10 mg via INTRAVENOUS

## 2019-05-17 MED ORDER — FENTANYL CITRATE (PF) 250 MCG/5ML IJ SOLN
INTRAMUSCULAR | Status: AC
  Filled 2019-05-17: qty 5

## 2019-05-17 MED ORDER — ASPIRIN EC 81 MG PO TBEC
81.0000 mg | DELAYED_RELEASE_TABLET | Freq: Once | ORAL | Status: AC
  Administered 2019-05-17: 09:00:00 81 mg via ORAL
  Filled 2019-05-17: qty 1

## 2019-05-17 MED ORDER — ONDANSETRON 4 MG PO TBDP: 4.0000 mg | ORAL_TABLET | Freq: Four times a day (QID) | ORAL | Status: DC | PRN

## 2019-05-17 MED ORDER — FENTANYL CITRATE (PF) 100 MCG/2ML IJ SOLN: 25.0000 ug | INTRAMUSCULAR | Status: DC | PRN

## 2019-05-17 MED ORDER — LACTATED RINGERS IR SOLN
Status: DC | PRN
  Administered 2019-05-17: 1000 mL

## 2019-05-17 MED ORDER — ACETAMINOPHEN 500 MG PO TABS: 1000.0000 mg | ORAL_TABLET | Freq: Once | ORAL | Status: DC

## 2019-05-17 MED ORDER — PROPOFOL 10 MG/ML IV BOLUS
INTRAVENOUS | Status: DC | PRN
  Administered 2019-05-17: 160 mg via INTRAVENOUS

## 2019-05-17 MED ORDER — DOCUSATE SODIUM 100 MG PO CAPS
100.0000 mg | ORAL_CAPSULE | Freq: Two times a day (BID) | ORAL | Status: DC
  Administered 2019-05-17: 100 mg via ORAL
  Filled 2019-05-17: qty 1

## 2019-05-17 MED ORDER — SUGAMMADEX SODIUM 200 MG/2ML IV SOLN
INTRAVENOUS | Status: DC | PRN
  Administered 2019-05-17: 200 mg via INTRAVENOUS

## 2019-05-17 MED ORDER — KCL IN DEXTROSE-NACL 20-5-0.45 MEQ/L-%-% IV SOLN
INTRAVENOUS | Status: DC
  Filled 2019-05-17: qty 1000

## 2019-05-17 MED ORDER — BUPIVACAINE HCL 0.25 % IJ SOLN
INTRAMUSCULAR | Status: AC
  Filled 2019-05-17: qty 1

## 2019-05-17 MED ORDER — DEXAMETHASONE SODIUM PHOSPHATE 10 MG/ML IJ SOLN
INTRAMUSCULAR | Status: AC
  Filled 2019-05-17: qty 1

## 2019-05-17 MED ORDER — ENALAPRIL MALEATE 5 MG PO TABS
5.0000 mg | ORAL_TABLET | Freq: Every day | ORAL | Status: DC
  Administered 2019-05-17: 5 mg via ORAL
  Filled 2019-05-17 (×3): qty 1

## 2019-05-17 MED ORDER — MIDAZOLAM HCL 5 MG/5ML IJ SOLN
INTRAMUSCULAR | Status: DC | PRN
  Administered 2019-05-17: 2 mg via INTRAVENOUS

## 2019-05-17 MED ORDER — LACTATED RINGERS IV SOLN: INTRAVENOUS | Status: DC

## 2019-05-17 MED ORDER — ONDANSETRON HCL 4 MG/2ML IJ SOLN
INTRAMUSCULAR | Status: DC | PRN
  Administered 2019-05-17: 4 mg via INTRAVENOUS

## 2019-05-17 MED ORDER — ACETAMINOPHEN 500 MG PO TABS
1000.0000 mg | ORAL_TABLET | ORAL | Status: AC
  Administered 2019-05-17: 500 mg via ORAL
  Filled 2019-05-17: qty 2

## 2019-05-17 MED ORDER — GABAPENTIN 100 MG PO CAPS
200.0000 mg | ORAL_CAPSULE | ORAL | Status: AC
  Administered 2019-05-17: 08:00:00 200 mg via ORAL
  Filled 2019-05-17: qty 2

## 2019-05-17 MED ORDER — MIDAZOLAM HCL 2 MG/2ML IJ SOLN
INTRAMUSCULAR | Status: AC
  Filled 2019-05-17: qty 2

## 2019-05-17 MED ORDER — ROCURONIUM BROMIDE 10 MG/ML (PF) SYRINGE
PREFILLED_SYRINGE | INTRAVENOUS | Status: DC | PRN
  Administered 2019-05-17: 70 mg via INTRAVENOUS

## 2019-05-17 MED ORDER — 0.9 % SODIUM CHLORIDE (POUR BTL) OPTIME
TOPICAL | Status: DC | PRN
  Administered 2019-05-17: 11:00:00 1000 mL

## 2019-05-17 MED ORDER — ONDANSETRON HCL 4 MG/2ML IJ SOLN
INTRAMUSCULAR | Status: AC
  Filled 2019-05-17: qty 2

## 2019-05-17 MED ORDER — CELECOXIB 200 MG PO CAPS
400.0000 mg | ORAL_CAPSULE | Freq: Once | ORAL | Status: AC
  Administered 2019-05-17: 08:00:00 200 mg via ORAL
  Filled 2019-05-17: qty 2

## 2019-05-17 MED ORDER — SIMETHICONE 80 MG PO CHEW: 40.0000 mg | CHEWABLE_TABLET | Freq: Four times a day (QID) | ORAL | Status: DC | PRN

## 2019-05-17 MED ORDER — HEPARIN SODIUM (PORCINE) 5000 UNIT/ML IJ SOLN
5000.0000 [IU] | Freq: Three times a day (TID) | INTRAMUSCULAR | Status: DC
  Administered 2019-05-17 – 2019-05-18 (×2): 5000 [IU] via SUBCUTANEOUS
  Filled 2019-05-17 (×2): qty 1

## 2019-05-17 SURGICAL SUPPLY — 38 items
APPLIER CLIP 5 13 M/L LIGAMAX5 (MISCELLANEOUS)
BENZOIN TINCTURE PRP APPL 2/3 (GAUZE/BANDAGES/DRESSINGS) ×3 IMPLANT
BNDG ADH 1X3 SHEER STRL LF (GAUZE/BANDAGES/DRESSINGS) ×3 IMPLANT
CABLE HIGH FREQUENCY MONO STRZ (ELECTRODE) IMPLANT
CLIP APPLIE 5 13 M/L LIGAMAX5 (MISCELLANEOUS) IMPLANT
CLOSURE STERI-STRIP 1/2X4 (GAUZE/BANDAGES/DRESSINGS) ×1
CLOSURE WOUND 1/2 X4 (GAUZE/BANDAGES/DRESSINGS)
CLSR STERI-STRIP ANTIMIC 1/2X4 (GAUZE/BANDAGES/DRESSINGS) ×2 IMPLANT
COVER SURGICAL LIGHT HANDLE (MISCELLANEOUS) ×3 IMPLANT
COVER WAND RF STERILE (DRAPES) IMPLANT
CUTTER FLEX LINEAR 45M (STAPLE) ×3 IMPLANT
DECANTER SPIKE VIAL GLASS SM (MISCELLANEOUS) ×3 IMPLANT
GLOVE BIO SURGEON STRL SZ7.5 (GLOVE) ×3 IMPLANT
GLOVE INDICATOR 8.0 STRL GRN (GLOVE) ×3 IMPLANT
GOWN STRL REUS W/TWL XL LVL3 (GOWN DISPOSABLE) ×6 IMPLANT
GRASPER SUT TROCAR 14GX15 (MISCELLANEOUS) IMPLANT
IV LACTATED RINGERS 1000ML (IV SOLUTION) ×3 IMPLANT
KIT BASIN OR (CUSTOM PROCEDURE TRAY) ×3 IMPLANT
KIT TURNOVER KIT A (KITS) IMPLANT
PENCIL SMOKE EVACUATOR (MISCELLANEOUS) IMPLANT
POUCH RETRIEVAL ECOSAC 10 (ENDOMECHANICALS) ×1 IMPLANT
POUCH RETRIEVAL ECOSAC 10MM (ENDOMECHANICALS) ×2
RELOAD 45 VASCULAR/THIN (ENDOMECHANICALS) ×3 IMPLANT
RELOAD STAPLE TA45 3.5 REG BLU (ENDOMECHANICALS) IMPLANT
SCISSORS ENDO CVD 5DCS (MISCELLANEOUS) ×3 IMPLANT
SET IRRIG TUBING LAPAROSCOPIC (IRRIGATION / IRRIGATOR) ×3 IMPLANT
SET TUBE SMOKE EVAC HIGH FLOW (TUBING) ×3 IMPLANT
SHEARS HARMONIC ACE PLUS 36CM (ENDOMECHANICALS) ×3 IMPLANT
SLEEVE XCEL OPT CAN 5 100 (ENDOMECHANICALS) ×3 IMPLANT
STRIP CLOSURE SKIN 1/2X4 (GAUZE/BANDAGES/DRESSINGS) IMPLANT
SUT MNCRL AB 4-0 PS2 18 (SUTURE) ×3 IMPLANT
SUT VICRYL 0 TIES 12 18 (SUTURE) IMPLANT
SUT VICRYL 0 UR6 27IN ABS (SUTURE) ×3 IMPLANT
TOWEL OR 17X26 10 PK STRL BLUE (TOWEL DISPOSABLE) ×3 IMPLANT
TRAY FOLEY MTR SLVR 16FR STAT (SET/KITS/TRAYS/PACK) ×3 IMPLANT
TRAY LAPAROSCOPIC (CUSTOM PROCEDURE TRAY) ×3 IMPLANT
TROCAR BLADELESS OPT 5 100 (ENDOMECHANICALS) ×3 IMPLANT
TROCAR XCEL BLUNT TIP 100MML (ENDOMECHANICALS) ×3 IMPLANT

## 2019-05-17 NOTE — Anesthesia Procedure Notes (Signed)
Procedure Name: Intubation Date/Time: 05/17/2019 10:13 AM Performed by: Talbot Grumbling, CRNA Pre-anesthesia Checklist: Patient identified, Emergency Drugs available, Suction available and Patient being monitored Patient Re-evaluated:Patient Re-evaluated prior to induction Oxygen Delivery Method: Circle system utilized Preoxygenation: Pre-oxygenation with 100% oxygen Induction Type: IV induction Ventilation: Mask ventilation without difficulty Laryngoscope Size: Mac and 3 Grade View: Grade I Tube type: Oral Tube size: 8.0 mm Number of attempts: 1 Airway Equipment and Method: Stylet Placement Confirmation: ETT inserted through vocal cords under direct vision,  positive ETCO2 and breath sounds checked- equal and bilateral Secured at: 23 cm Tube secured with: Tape Dental Injury: Teeth and Oropharynx as per pre-operative assessment

## 2019-05-17 NOTE — Op Note (Addendum)
[name] [id number] December 01, 1955 05/17/2019  Appendectomy, Laparoscopic Interval, Procedure Note  Indications: The patient presented with a history of perforated appendicitis with abscess back in December 2020.  He was managed with a percutaneous drain and antibiotics.  He recovered and his drain was removed.  He did have evidence of a appendicolith on his CT scan.  In the office for follow-up we discussed pros and cons of interval appendectomy.  We discussed the risk and benefits of appendectomy.  We discussed the risk of recurrent appendicitis.  The patient was very much interested in definitive management and wanted surgical removal of his appendix.  He was on chronic anticoagulation because of double mechanical valves.  His perioperative anticoagulation is being managed by the Coumadin team with [facility].  He was stopped on his Coumadin and bridged with 80 mg of Lovenox twice daily.  His last Lovenox was this past Saturday.  I gave him 81 mg of aspirin in the holding area.  He was also given oral Tylenol and gabapentin for E ras protocol.  Pre-operative Diagnosis: History of perforated appendicitis  Post-operative Diagnosis: Same  Surgeon: Greer Pickerel MD FACS  Assistants: None  Anesthesia: General endotracheal anesthesia  Procedure Details  The patient was seen again in the Holding Room. The risks, benefits, complications, treatment options, and expected outcomes were discussed with the patient and/or family. The possibilities of perforation of viscus, bleeding, recurrent infection, the need for additional procedures, failure to diagnose a condition, and creating a complication requiring transfusion or operation were discussed. There was concurrence with the proposed plan and informed consent was obtained. The site of surgery was properly noted. The patient was taken to Operating Room, identified as [name] and the procedure verified as Appendectomy. A Time Out was held and  the above information confirmed.  The patient was placed in the supine position and general anesthesia was induced, along with placement of orogastric tube, SCDs, and a Foley catheter. The abdomen was prepped and draped in a sterile fashion. A 1.5 centimeter infraumbilical incision was made.  The umbilical stalk was elevated, and the midline fascia was incised with a #11 blade.  A Kelly clamp was used to confirm entrance into the peritoneal cavity.  A pursestring suture was passed around the incision with a 0 Vicryl.  A 48mm Hasson was introduced into the abdomen and the tails of the suture were used to hold the Hasson in place.   The pneumoperitoneum was then established to steady pressure of 15 mmHg.  Additional 5 mm cannulas then placed in the left lower quadrant of the abdomen and the suprapubic region under direct visualization. A careful evaluation of the entire abdomen was carried out. The patient was placed in Trendelenburg and left lateral decubitus position.  There were some mild adhesions in the right lower abdomen.  These were taken down with harmonic scalpel.  In the appendiceal tip was visualized slightly adhered to the right lateral abdominal wall.  It was reflected upward.  Its attachments to the lateral abdominal were taken down with harmonic scalpel.  The patient was found to have an appendix minimal inflammation that was extending into the pelvis. There was no evidence of perforation.  The appendix was carefully dissected. The appendix was was skeletonized with the harmonic scalpel.   The appendix was divided at its base using an endo-GIA stapler with a white load. No appendiceal stump was left in place. The appendix was removed from the abdomen with an Ecco bag through  the umbilical port.  There was no evidence of bleeding, leakage, or complication after division of the appendix. Irrigation was also performed and irrigate suctioned from the abdomen as well.  I reinspected the right lower  quadrant and there is no evidence of bleeding around the staple line or mesoappendix.  I then performed a bilateral lateral abdominal wall tap block using quarter percent bupivacaine with laparoscopic guidance.  The umbilical port site was closed with the purse string suture.  An additional interrupted 0 Vicryl suture was placed at the umbilical fascia given his central truncal obesity. the closure was viewed laparoscopically. There was no residual palpable fascial defect.  The trocar site skin wounds were closed with 4-0 Monocryl.  Benzoin, Steri-Strips and sterile dressings were applied to the skin incisions.  Instrument, sponge, and needle counts were correct at the conclusion of the case.   Findings: The appendix was found to perhaps some mild inflammation.  There was some evidence of adhesions from prior infection in the right lower quadrant. There were not signs of necrosis.  There was not perforation. There was not abscess formation.   Pt can resume his anticoagulation regimen as recommended by [facility].  He will start his Coumadin tomorrow on February 16.  He will start his Lovenox bridge on 2:16 PM for 1 dose and then resume twice daily dosing on the 17th.  Estimated Blood Loss:  Minimal         Drains: None         Specimens: Appendix         Complications:  None; patient tolerated the procedure well.         Disposition: PACU - hemodynamically stable.         Condition: stable  Leighton Ruff. Redmond Pulling, MD, FACS General, Bariatric, & Minimally Invasive Surgery [facility], [place]

## 2019-05-17 NOTE — Transfer of Care (Signed)
Immediate Anesthesia Transfer of Care Note  Patient: [name]  Procedure(s) Performed: INTERVAL LAPAROSCOPIC APPENDECTOMY (N/A )  Patient Location: PACU  Anesthesia Type:General  Level of Consciousness: sedated  Airway & Oxygen Therapy: Patient Spontanous Breathing and Patient connected to face mask oxygen  Post-op Assessment: Report given to RN and Post -op Vital signs reviewed and stable  Post vital signs: Reviewed and stable  Last Vitals:  Vitals Value Taken Time  BP    Temp    Pulse    Resp    SpO2      Last Pain:  Vitals:   05/17/19 0809  TempSrc: Oral      Patients Stated Pain Goal: 3 ([id number])  Complications: No apparent anesthesia complications

## 2019-05-17 NOTE — Interval H&P Note (Signed)
History and Physical Interval Note:  05/17/2019 9:57 AM  [name]  has presented today for surgery, with the diagnosis of HISTORY OF RUPTURED APPENDICITIS.  The various methods of treatment have been discussed with the patient and family. After consideration of risks, benefits and other options for treatment, the patient has consented to  Procedure(s): INTERVAL LAPAROSCOPIC APPENDECTOMY (N/A) as a surgical intervention.  The patient's history has been reviewed, patient examined, no change in status, stable for surgery.  I have reviewed the patient's chart and labs.  Questions were answered to the patient's satisfaction.    Coumadin/lovenox been managed by [facility]. Last lovenox sat  Gave 81 ASA this am  Greer Pickerel

## 2019-05-17 NOTE — Discharge Instructions (Signed)
FOLLOW THE COUMADIN/LOVENOX SCHEDULE AS GIVEN TO YOU BY [facility] GET YOUR INR CHECKED ON 2/19 AT [facility] - SEE THEIR INSTRUCTION SHEET   [facility] LAPAROSCOPIC SURGERY: POST OP INSTRUCTIONS Always review your discharge instruction sheet given to you by the facility where your surgery was performed. IF YOU HAVE DISABILITY OR FAMILY LEAVE FORMS, YOU MUST BRING THEM TO THE OFFICE FOR PROCESSING.   DO NOT GIVE THEM TO YOUR DOCTOR.  PAIN CONTROL  1. First take acetaminophen (Tylenol) to control your pain after surgery.  Follow directions on package.  Taking acetaminophen (Tylenol)  regularly after surgery will help to control your pain and lower the amount of prescription pain medication you may need.  You should not take more than 3,000 mg (3 grams) of acetaminophen (Tylenol) in 24 hours.  You should not take ibuprofen (Advil), aleve, motrin, naprosyn or other NSAIDS if you have a history of stomach ulcers or chronic kidney disease.  2. A prescription for pain medication may be given to you upon discharge.  Take your pain medication as prescribed, if you still have uncontrolled pain after taking acetaminophen (Tylenol) or ibuprofen (Advil). 3. Use ice packs to help control pain. 4. If you need a refill on your pain medication, please contact your pharmacy.  They will contact our office to request authorization. Prescriptions will not be filled after 5pm or on week-ends.  HOME MEDICATIONS 5. Take your usually prescribed medications unless otherwise directed.  DIET 6. You should follow a light diet the first few days after arrival home.  Be sure to include lots of fluids daily. Avoid fatty, fried foods.   CONSTIPATION 7. It is common to experience some constipation after surgery and if you are taking pain medication.  Increasing fluid intake and taking a stool softener (such as Colace) will usually help or prevent this problem from occurring.  A mild laxative (Milk of  Magnesia or Miralax) should be taken according to package instructions if there are no bowel movements after 48 hours.  WOUND/INCISION CARE 8. Most patients will experience some swelling and bruising in the area of the incisions.  Ice packs will help.  Swelling and bruising can take several days to resolve.  9. Unless discharge instructions indicate otherwise, follow guidelines below  a. STERI-STRIPS - you may remove your outer bandages 48 hours after surgery, and you may shower at that time.  You have steri-strips (small skin tapes) in place directly over the incision.  These strips should be left on the skin for 7-10 days.   b. DERMABOND/SKIN GLUE - you may shower in 24 hours.  The glue will flake off over the next 2-3 weeks. 10. Any sutures or staples will be removed at the office during your follow-up visit.  ACTIVITIES 11. You may resume regular (light) daily activities beginning the next day--such as daily self-care, walking, climbing stairs--gradually increasing activities as tolerated.  You may have sexual intercourse when it is comfortable.  Refrain from any heavy lifting or straining until approved by your doctor. a. You may drive when you are no longer taking prescription pain medication, you can comfortably wear a seatbelt, and you can safely maneuver your car and apply brakes.  FOLLOW-UP 12. You should see your doctor in the office for a follow-up appointment approximately 2-3 weeks after your surgery.  You should have been given your post-op/follow-up appointment when your surgery was scheduled.  If you did not receive a post-op/follow-up appointment, make sure that  you call for this appointment within a day or two after you arrive home to insure a convenient appointment time.  OTHER INSTRUCTIONS 13.   WHEN TO CALL YOUR DOCTOR: 1. Fever over 101.0 2. Inability to urinate 3. Continued bleeding from incision. 4. Increased pain, redness, or drainage from the incision. 5. Increasing  abdominal pain  The clinic staff is available to answer your questions during regular business hours.  Please don't hesitate to call and ask to speak to one of the nurses for clinical concerns.  If you have a medical emergency, go to the nearest emergency room or call 911.  A surgeon from [facility] is always on call at the hospital. [address] [place], [place], [place]  [address] ? [address] [place], [place], [place]   [address] [phone] ? [phone] ? FAX [phone] Web site: [email or web address]

## 2019-05-17 NOTE — Anesthesia Postprocedure Evaluation (Signed)
Anesthesia Post Note  Patient: [name]  Procedure(s) Performed: INTERVAL LAPAROSCOPIC APPENDECTOMY (N/A )     Patient location during evaluation: PACU Anesthesia Type: General Level of consciousness: sedated Pain management: pain level controlled Vital Signs Assessment: post-procedure vital signs reviewed and stable Respiratory status: spontaneous breathing and respiratory function stable Cardiovascular status: stable Postop Assessment: no apparent nausea or vomiting Anesthetic complications: no    Last Vitals:  Vitals:   05/17/19 1137 05/17/19 1245  BP: (!) 153/84   Pulse: 67   Resp: 14   Temp: (!) 36.4 C 36.7 C  SpO2: 100%     Last Pain:  Vitals:   05/17/19 1230  TempSrc:   PainSc: 2                  Crixus Mcaulay DANIEL

## 2019-05-18 DIAGNOSIS — K389 Disease of appendix, unspecified: Secondary | ICD-10-CM | POA: Diagnosis not present

## 2019-05-18 LAB — CBC
HCT: 37.8 % — ABNORMAL LOW (ref 39.0–52.0)
Hemoglobin: 11.8 g/dL — ABNORMAL LOW (ref 13.0–17.0)
MCH: 30.3 pg (ref 26.0–34.0)
MCHC: 31.2 g/dL (ref 30.0–36.0)
MCV: 96.9 fL (ref 80.0–100.0)
Platelets: 122 10*3/uL — ABNORMAL LOW (ref 150–400)
RBC: 3.9 MIL/uL — ABNORMAL LOW (ref 4.22–5.81)
RDW: 13.8 % (ref 11.5–15.5)
WBC: 6.4 10*3/uL (ref 4.0–10.5)
nRBC: 0 % (ref 0.0–0.2)

## 2019-05-18 LAB — SURGICAL PATHOLOGY

## 2019-05-18 MED ORDER — OXYCODONE HCL 5 MG PO TABS: 5.0000 mg | ORAL_TABLET | Freq: Four times a day (QID) | ORAL | 0 refills | Status: DC | PRN

## 2019-05-18 NOTE — Discharge Summary (Signed)
Physician Discharge Summary  [name] [id number] DOB: 21-May-1955 DOA: 05/17/2019  PCP: [name], MD  Admit date: 05/17/2019 Discharge date: 05/18/2019  Recommendations for Outpatient Follow-up:    Follow-up Information    Greer Pickerel, MD. Schedule an appointment as soon as possible for a visit in 3 weeks.   Specialty: General Surgery Why: For wound re-check Contact information: [address] [place] [address] [phone]        [facility] Follow up on 05/21/2019.   Why: go as instructed to [facility] for INR level         Discharge Diagnoses:  1. H/o perforated appendicitis with abscess 03/2019 2. HTN 3. Mechanical aortic & mitral valve 4. CAD s/p inferior STEMI s/p DES to RCA 10/2017 5. Chronic anticoagulation  Surgical Procedure: Interval Laparoscopic Appendectomy  Discharge Condition: good Disposition: home  Diet recommendation: regular  Filed Weights   05/17/19 0805  Weight: 93 kg    History of present illness:  Pt had been in hospital in Dec 2020 with perforated appendicitis with abscess which was managed with iv abx and percutaneous drain.  His abscess resolved and his drain was removed and he recovered. Because he had an appendicolith and was concerned about recurrent appendicitis he elected for interval appendectomy.  He was bridged perioperatively with lovenox which was managed by his [facility] Course:  He stopped his coumadin several days before surgery and was switched to lovenox. On the morning of his surgery he was given oral tylenol, gabapentin, and ASA. He was taken to the OR for uneventful interval laparoscopic appendectomy (ERAS protocol). He was kept overnight for observation given his mechanical valve history. He was maintained on perioperative chemical vte prophylaxis. On POD 1 he was doing well. Tolerating a diet, stable vitals,  ambulating without difficulty, pain well controlled. He was deemed stable for dc. I had discussed dc instructions with him and his caregiver.   He is resume his oral coumadin today and start bid therapeutic lovenox this evening and follow the bridging instructions given to him by [facility]. They voiced understanding. He has a f/u appt for INR level later this week.   BP (!) 108/52 (BP Location: Left Arm)   Pulse 77   Temp 97.9 F (36.6 C) (Oral)   Resp 18   Ht 5\' 9"  (1.753 m)   Wt 93 kg   SpO2 96%   BMI 30.27 kg/m   Gen: alert, NAD, non-toxic appearing Pupils: equal, no scleral icterus Pulm: Lungs clear to auscultation, symmetric chest rise CV: regular rate and rhythm; +click Abd: soft, mild approp tender, min distension. No cellulitis. No incisional hernia Ext: no edema, no calf tenderness Skin: no rash, no jaundice    Discharge Instructions  Discharge Instructions    Call MD for:   Complete by: As directed    Temperature >101   Call MD for:  hives   Complete by: As directed    Call MD for:  persistant dizziness or light-headedness   Complete by: As directed    Call MD for:  persistant nausea and vomiting   Complete by: As directed    Call MD for:  redness, tenderness, or signs of infection (pain, swelling, redness, odor or green/yellow discharge around incision site)   Complete by: As directed    Call MD for:  severe uncontrolled pain   Complete by: As directed    Diet -  low sodium heart healthy   Complete by: As directed    Discharge instructions   Complete by: As directed    See [facility] discharge instructions  FOLLOW COUMADIN/LOVENOX INSTRUCTIONS FROM [place] AND START LOVENOX TONIGHT   Increase activity slowly   Complete by: As directed      Allergies as of 05/18/2019   No Known Allergies     Medication List    TAKE these medications   aspirin EC 81 MG tablet Take 81 mg by mouth daily.   atorvastatin 80 MG  tablet Commonly known as: LIPITOR Take 80 mg by mouth every evening.   carvedilol 3.125 MG tablet Commonly known as: COREG Take 3.125 mg by mouth 2 (two) times daily with a meal.   enalapril 5 MG tablet Commonly known as: VASOTEC Take 5 mg by mouth daily.   enoxaparin 80 MG/0.8ML injection Commonly known as: LOVENOX Inject 0.8 mLs (80 mg total) into the skin every 12 (twelve) hours for 7 days. Please continue until INR 2.5-3.5   oxyCODONE 5 MG immediate release tablet Commonly known as: Oxy IR/ROXICODONE Take 1 tablet (5 mg total) by mouth every 6 (six) hours as needed for severe pain.   warfarin 7.5 MG tablet Commonly known as: COUMADIN Take 7.5 mg by mouth See admin instructions. Take 1 tablet (7.5 mg) by mouth in the evenings on Mondays, Wednesdays, Thursdays, Saturdays, & Sundays.   warfarin 5 MG tablet Commonly known as: COUMADIN Take 5 mg by mouth See admin instructions. Take 1 tablet (5 mg) by mouth twice weekly in the evenings on Tuesdays & Fridays.      Follow-up Information    Greer Pickerel, MD. Schedule an appointment as soon as possible for a visit in 3 weeks.   Specialty: General Surgery Why: For wound re-check Contact information: [address] [place] [address] [phone]        [facility] Follow up on 05/21/2019.   Why: go as instructed to [facility] [facility] for INR level           The results of significant diagnostics from this hospitalization (including imaging, microbiology, ancillary and laboratory) are listed below for reference.    Significant Diagnostic Studies: CT ABDOMEN PELVIS W CONTRAST  Result Date: 05/04/2019 CLINICAL DATA:  Ruptured appendicitis follow-up. EXAM: CT ABDOMEN AND PELVIS WITH CONTRAST TECHNIQUE: Multidetector CT imaging of the abdomen and pelvis was performed using the standard protocol following bolus administration of intravenous contrast. CONTRAST:   19mL ISOVUE-300 IOPAMIDOL (ISOVUE-300) INJECTION 61% COMPARISON:  December 142020 FINDINGS: Lower chest: The lung bases are clear. The heart size is normal. Hepatobiliary: The liver is normal. Normal gallbladder.There is no biliary ductal dilation. Pancreas: Normal contours without ductal dilatation. No peripancreatic fluid collection. Spleen: No splenic laceration or hematoma. Adrenals/Urinary Tract: --Adrenal glands: No adrenal hemorrhage. --Right kidney/ureter: No hydronephrosis or perinephric hematoma. --Left kidney/ureter: No hydronephrosis or perinephric hematoma. --Urinary bladder: There is some mild bladder wall thickening. Stomach/Bowel: --Stomach/Duodenum: There is a moderate-sized hiatal hernia. --Small bowel: No dilatation or inflammation. --Colon: There is some mild asymmetric wall thickening of the cecum at the level of the appendiceal orifice. --Appendix: The appendix remains dilated measuring up to approximately 1.4 cm. There are persistent mild adjacent inflammatory changes. The previously demonstrated periappendiceal abscess appears to have resolved. The defect in the appendix wall is no longer well appreciated on this exam. Vascular/Lymphatic: Atherosclerotic calcification is present within the  non-aneurysmal abdominal aorta, without hemodynamically significant stenosis. --No retroperitoneal lymphadenopathy. --No mesenteric lymphadenopathy. --there is mild pelvic and inguinal adenopathy, stable from prior study. Reproductive: The prostate gland is enlarged. Other: No ascites or free air. There are bilateral fat containing inguinal hernias, right worse than left. Musculoskeletal. No acute displaced fractures. IMPRESSION: 1. Significant interval improvement in appearance of the appendix and right lower quadrant. The appendix remains dilated measuring up to approximately 1.4 cm and there are minimal residual inflammatory changes in the right lower quadrant. The previously demonstrated  periappendiceal fluid collection has resolved. 2. Mild asymmetric wall thickening of the cecum adjacent to the appendix. While this may be reactive in etiology, an underlying mass cannot be excluded and should be correlated with colonoscopy. 3. Mild bladder wall thickening of unknown clinical significance. This may be secondary to outlet obstruction or cystitis. Correlation with urinalysis is recommended. The prostate gland is enlarged. 4.  Aortic Atherosclerosis (ICD10-I70.0). Electronically Signed   By: Constance Holster M.D.   On: 05/04/2019 00:51    Microbiology: Recent Results (from the past 240 hour(s))  SARS CORONAVIRUS 2 (TAT 6-24 HRS) Nasopharyngeal Nasopharyngeal Swab     Status: None   Collection Time: 05/13/19 11:29 AM   Specimen: Nasopharyngeal Swab  Result Value Ref Range Status   SARS Coronavirus 2 NEGATIVE NEGATIVE Final    Comment: (NOTE) SARS-CoV-2 target nucleic acids are NOT DETECTED. The SARS-CoV-2 RNA is generally detectable in upper and lower respiratory specimens during the acute phase of infection. Negative results do not preclude SARS-CoV-2 infection, do not rule out co-infections with other pathogens, and should not be used as the sole basis for treatment or other patient management decisions. Negative results must be combined with clinical observations, patient history, and epidemiological information. The expected result is Negative. Fact Sheet for Patients: SugarRoll.be Fact Sheet for Healthcare Providers: https://www.woods-mathews.com/ This test is not yet approved or cleared by the [place] FDA and  has been authorized for detection and/or diagnosis of SARS-CoV-2 by FDA under an Emergency Use Authorization (EUA). This EUA will remain  in effect (meaning this test can be used) for the duration of the COVID-19 declaration under Section 56 4(b)(1) of the Act, 21 U.S.C. section 360bbb-3(b)(1), unless the  authorization is terminated or revoked sooner. Performed at [facility], [place] [address], [place], [place] [address]      Labs: Basic Metabolic Panel: Recent Labs  Lab 05/11/19 1352  NA 141  K 4.2  CL 104  CO2 30  GLUCOSE 118*  BUN 20  CREATININE 0.95  CALCIUM 9.6   Liver Function Tests: Recent Labs  Lab 05/11/19 1352  AST 22  ALT 25  ALKPHOS 87  BILITOT 0.8  PROT 6.9  ALBUMIN 3.9   No results for input(s): LIPASE, AMYLASE in the last 168 hours. No results for input(s): AMMONIA in the last 168 hours. CBC: Recent Labs  Lab 05/11/19 1352 05/18/19 0339  WBC 6.6 6.4  NEUTROABS 4.9  --   HGB 13.1 11.8*  HCT 42.7 37.8*  MCV 97.9 96.9  PLT 178 122*   Cardiac Enzymes: No results for input(s): CKTOTAL, CKMB, CKMBINDEX, TROPONINI in the last 168 hours. BNP: BNP (last 3 results) No results for input(s): BNP in the last 8760 hours.  ProBNP (last 3 results) No results for input(s): PROBNP in the last 8760 hours.  CBG: No results for input(s): GLUCAP in the last 168 hours.  Active Problems:   S/P laparoscopic appendectomy   Time coordinating  discharge: 15 min  Signed:  Gayland Curry, MD [facility], [place] [phone] 05/18/2019, 9:07 AM

## 2019-06-18 ENCOUNTER — Ambulatory Visit: Payer: 59 | Attending: Internal Medicine

## 2019-06-18 DIAGNOSIS — Z23 Encounter for immunization: Secondary | ICD-10-CM

## 2019-06-18 NOTE — Progress Notes (Signed)
   Covid-19 Vaccination Clinic  Name:  [name]    MRN: [id number] DOB: 04-Jun-1955  06/18/2019  [name] was observed post Covid-19 immunization for 15 minutes without incident. He was provided with Vaccine Information Sheet and instruction to access the V-Safe system.   [name] was instructed to call 911 with any severe reactions post vaccine: Marland Kitchen Difficulty breathing  . Swelling of face and throat  . A fast heartbeat  . A bad rash all over body  . Dizziness and weakness   Immunizations Administered    Name Date Dose VIS Date Route   Moderna COVID-19 Vaccine 06/18/2019 11:22 AM 0.5 mL 03/02/2019 Intramuscular   Manufacturer: Moderna   Lot: [id number]   AtenBE:[id number]

## 2019-07-20 ENCOUNTER — Ambulatory Visit: Payer: 59 | Attending: Internal Medicine

## 2019-07-20 DIAGNOSIS — Z23 Encounter for immunization: Secondary | ICD-10-CM

## 2019-07-20 NOTE — Progress Notes (Signed)
   Covid-19 Vaccination Clinic  Name:  Bodhi Siew    MRN: [id number] DOB: 1956/01/25  07/20/2019  Mr. Delanoy was observed post Covid-19 immunization for 15 minutes without incident. He was provided with Vaccine Information Sheet and instruction to access the V-Safe system.   Mr. Raikes was instructed to call 911 with any severe reactions post vaccine: Marland Kitchen Difficulty breathing  . Swelling of face and throat  . A fast heartbeat  . A bad rash all over body  . Dizziness and weakness   Immunizations Administered    Name Date Dose VIS Date Route   Moderna COVID-19 Vaccine 07/20/2019 10:51 AM 0.5 mL 03/2019 Intramuscular   Manufacturer: Moderna   Lot: [id number]   [place]BE:[id number]

## 2019-11-17 ENCOUNTER — Other Ambulatory Visit: Payer: 59

## 2019-11-17 ENCOUNTER — Other Ambulatory Visit: Payer: Self-pay

## 2019-11-17 DIAGNOSIS — Z13220 Encounter for screening for lipoid disorders: Secondary | ICD-10-CM

## 2019-11-17 DIAGNOSIS — R634 Abnormal weight loss: Secondary | ICD-10-CM

## 2019-11-17 DIAGNOSIS — I2510 Atherosclerotic heart disease of native coronary artery without angina pectoris: Secondary | ICD-10-CM

## 2019-11-17 DIAGNOSIS — R7309 Other abnormal glucose: Secondary | ICD-10-CM

## 2019-11-18 LAB — CBC WITH DIFFERENTIAL/PLATELET
Absolute Monocytes: 1065 cells/uL — ABNORMAL HIGH (ref 200–950)
Basophils Absolute: 36 cells/uL (ref 0–200)
Basophils Relative: 0.4 %
Eosinophils Absolute: 127 cells/uL (ref 15–500)
Eosinophils Relative: 1.4 %
HCT: 43.7 % (ref 38.5–50.0)
Hemoglobin: 14.4 g/dL (ref 13.2–17.1)
Lymphs Abs: 883 cells/uL (ref 850–3900)
MCH: 30.8 pg (ref 27.0–33.0)
MCHC: 33 g/dL (ref 32.0–36.0)
MCV: 93.4 fL (ref 80.0–100.0)
MPV: 11.8 fL (ref 7.5–12.5)
Monocytes Relative: 11.7 %
Neutro Abs: 6989 cells/uL (ref 1500–7800)
Neutrophils Relative %: 76.8 %
Platelets: 176 10*3/uL (ref 140–400)
RBC: 4.68 10*6/uL (ref 4.20–5.80)
RDW: 12.7 % (ref 11.0–15.0)
Total Lymphocyte: 9.7 %
WBC: 9.1 10*3/uL (ref 3.8–10.8)

## 2019-11-18 LAB — LIPID PANEL
Cholesterol: 121 mg/dL (ref <200)
HDL: 43 mg/dL (ref >=40)
LDL Cholesterol (Calc): 64 mg/dL (calc)
Non-HDL Cholesterol (Calc): 78 mg/dL (calc) (ref <130)
Total CHOL/HDL Ratio: 2.8 (calc) (ref <5.0)
Triglycerides: 61 mg/dL (ref <150)

## 2019-11-18 LAB — COMPLETE METABOLIC PANEL WITH GFR
AG Ratio: 1.6 (calc) (ref 1.0–2.5)
ALT: 16 U/L (ref 9–46)
AST: 17 U/L (ref 10–35)
Albumin: 3.8 g/dL (ref 3.6–5.1)
Alkaline phosphatase (APISO): 123 U/L (ref 35–144)
BUN: 14 mg/dL (ref 7–25)
CO2: 29 mmol/L (ref 20–32)
Calcium: 9.1 mg/dL (ref 8.6–10.3)
Chloride: 103 mmol/L (ref 98–110)
Creat: 0.91 mg/dL (ref 0.70–1.25)
GFR, Est African American: 104 mL/min/{1.73_m2} (ref >=60)
GFR, Est Non African American: 89 mL/min/{1.73_m2} (ref >=60)
Globulin: 2.4 g/dL (calc) (ref 1.9–3.7)
Glucose, Bld: 95 mg/dL (ref 65–99)
Potassium: 4.5 mmol/L (ref 3.5–5.3)
Sodium: 140 mmol/L (ref 135–146)
Total Bilirubin: 0.6 mg/dL (ref 0.2–1.2)
Total Protein: 6.2 g/dL (ref 6.1–8.1)

## 2019-11-18 LAB — HEMOGLOBIN A1C
Hgb A1c MFr Bld: 5.6 % of total Hgb (ref <5.7)
Mean Plasma Glucose: 114 (calc)
eAG (mmol/L): 6.3 (calc)

## 2019-11-22 ENCOUNTER — Other Ambulatory Visit: Payer: Self-pay

## 2019-11-22 ENCOUNTER — Ambulatory Visit (INDEPENDENT_AMBULATORY_CARE_PROVIDER_SITE_OTHER): Payer: 59 | Admitting: Family Medicine

## 2019-11-22 VITALS — BP 102/58 | HR 80 | Temp 98.2°F | Resp 18 | Wt 201.0 lb

## 2019-11-22 DIAGNOSIS — E7800 Pure hypercholesterolemia, unspecified: Secondary | ICD-10-CM

## 2019-11-22 DIAGNOSIS — I480 Paroxysmal atrial fibrillation: Secondary | ICD-10-CM

## 2019-11-22 DIAGNOSIS — Z720 Tobacco use: Secondary | ICD-10-CM | POA: Diagnosis not present

## 2019-11-22 DIAGNOSIS — I2510 Atherosclerotic heart disease of native coronary artery without angina pectoris: Secondary | ICD-10-CM

## 2019-11-22 DIAGNOSIS — I739 Peripheral vascular disease, unspecified: Secondary | ICD-10-CM | POA: Diagnosis not present

## 2019-11-22 DIAGNOSIS — Z952 Presence of prosthetic heart valve: Secondary | ICD-10-CM | POA: Diagnosis not present

## 2019-11-22 NOTE — Progress Notes (Signed)
Subjective:    Patient ID: [name], male    DOB: [birth year], 64 y.o.   MRN: [id number]  HPI Patient is a very pleasant 64 year old Caucasian male here today for a checkup.  He has a history of coronary artery disease, peripheral vascular disease, rheumatic heart disease with mitral valve repair with mechanical valve.  He is on lifelong Coumadin due to the valve repair.  He takes aspirin due to his history of coronary artery disease.  He also states that he had a stroke in the past.  Given his coronary artery disease, he is on a beta-blocker/carvedilol and enalapril in addition to his aspirin and Lipitor.  He most recently had lab work which is listed below: Lab on 11/17/2019  Component Date Value Ref Range Status  . Hgb A1c MFr Bld 11/17/2019 5.6  <5.7 % of total Hgb Final   Comment: For the purpose of screening for the presence of diabetes: . <5.7%       Consistent with the absence of diabetes 5.7-6.4%    Consistent with increased risk for diabetes             (prediabetes) > or =6.5%  Consistent with diabetes . This assay result is consistent with a decreased risk of diabetes. . Currently, no consensus exists regarding use of hemoglobin A1c for diagnosis of diabetes in children. . According to American Diabetes Association (ADA) guidelines, hemoglobin A1c <7.0% represents optimal control in non-pregnant diabetic patients. Different metrics may apply to specific patient populations.  Standards of Medical Care in Diabetes(ADA). .   . Mean Plasma Glucose 11/17/2019 114  (calc) Final  . eAG (mmol/L) 11/17/2019 6.3  (calc) Final  . Glucose, Bld 11/17/2019 95  65 - 99 mg/dL Final   Comment: .            Fasting reference interval .   . BUN 11/17/2019 14  7 - 25 mg/dL Final  . Creat 11/17/2019 0.91  0.70 - 1.25 mg/dL Final   Comment: For patients >17 years of age, the reference limit for Creatinine is approximately 13% higher for people identified as African-American. .    . GFR, Est Non African American 11/17/2019 89  > OR = 60 mL/min/1.60m2 Final  . GFR, Est African American 11/17/2019 104  > OR = 60 mL/min/1.42m2 Final  . BUN/Creatinine Ratio 50/35/4656 NOT APPLICABLE  6 - 22 (calc) Final  . Sodium 11/17/2019 140  135 - 146 mmol/L Final  . Potassium 11/17/2019 4.5  3.5 - 5.3 mmol/L Final  . Chloride 11/17/2019 103  98 - 110 mmol/L Final  . CO2 11/17/2019 29  20 - 32 mmol/L Final  . Calcium 11/17/2019 9.1  8.6 - 10.3 mg/dL Final  . Total Protein 11/17/2019 6.2  6.1 - 8.1 g/dL Final  . Albumin 11/17/2019 3.8  3.6 - 5.1 g/dL Final  . Globulin 11/17/2019 2.4  1.9 - 3.7 g/dL (calc) Final  . AG Ratio 11/17/2019 1.6  1.0 - 2.5 (calc) Final  . Total Bilirubin 11/17/2019 0.6  0.2 - 1.2 mg/dL Final  . Alkaline phosphatase (APISO) 11/17/2019 123  35 - 144 U/L Final  . AST 11/17/2019 17  10 - 35 U/L Final  . ALT 11/17/2019 16  9 - 46 U/L Final  . Cholesterol 11/17/2019 121  <200 mg/dL Final  . HDL 11/17/2019 43  > OR = 40 mg/dL Final  . Triglycerides 11/17/2019 61  <150 mg/dL Final  . LDL Cholesterol (Calc) 11/17/2019 64  mg/dL (  calc) Final   Comment: Reference range: <100 . Desirable range <100 mg/dL for primary prevention;   <70 mg/dL for patients with CHD or diabetic patients  with > or = 2 CHD risk factors. Marland Kitchen LDL-C is now calculated using the Martin-Hopkins  calculation, which is a validated novel method providing  better accuracy than the Friedewald equation in the  estimation of LDL-C.  Cresenciano Genre et al. Annamaria Helling. 1884;166(06): 2061-2068  (http://education.QuestDiagnostics.com/faq/FAQ164)   . Total CHOL/HDL Ratio 11/17/2019 2.8  <5.0 (calc) Final  . Non-HDL Cholesterol (Calc) 11/17/2019 78  <130 mg/dL (calc) Final   Comment: For patients with diabetes plus 1 major ASCVD risk  factor, treating to a non-HDL-C goal of <100 mg/dL  (LDL-C of <70 mg/dL) is considered a therapeutic  option.   . WBC 11/17/2019 9.1  3.8 - 10.8 Thousand/uL Final  . RBC  11/17/2019 4.68  4.20 - 5.80 Million/uL Final  . Hemoglobin 11/17/2019 14.4  13.2 - 17.1 g/dL Final  . HCT 11/17/2019 43.7  38 - 50 % Final  . MCV 11/17/2019 93.4  80.0 - 100.0 fL Final  . MCH 11/17/2019 30.8  27.0 - 33.0 pg Final  . MCHC 11/17/2019 33.0  32.0 - 36.0 g/dL Final  . RDW 11/17/2019 12.7  11.0 - 15.0 % Final  . Platelets 11/17/2019 176  140 - 400 Thousand/uL Final  . MPV 11/17/2019 11.8  7.5 - 12.5 fL Final  . Neutro Abs 11/17/2019 6,989  1,500 - 7,800 cells/uL Final  . Lymphs Abs 11/17/2019 883  850 - 3,900 cells/uL Final  . Absolute Monocytes 11/17/2019 1,065* 200 - 950 cells/uL Final  . Eosinophils Absolute 11/17/2019 127  15 - 500 cells/uL Final  . Basophils Absolute 11/17/2019 36  0 - 200 cells/uL Final  . Neutrophils Relative % 11/17/2019 76.8  % Final  . Total Lymphocyte 11/17/2019 9.7  % Final  . Monocytes Relative 11/17/2019 11.7  % Final  . Eosinophils Relative 11/17/2019 1.4  % Final  . Basophils Relative 11/17/2019 0.4  % Final   LDL cholesterol is excellent and well below 70.  Blood sugar that was recently elevated earlier this year has returned to normal and his hemoglobin A1c is excellent.  Liver and kidney test are outstanding.  Unfortunately the patient continues to smoke half a pack of cigarettes per day.  He is overdue for colonoscopy.  He is also due for Pneumovax 23.  He states that his prostate was checked by another physician this year.  Otherwise he is doing well with no concerns.  Specifically he denies any chest pain shortness of breath or dyspnea on exertion.  He denies any myalgias or right upper quadrant pain.  He denies any polyuria, polydipsia, blurry vision. Past Medical History:  Diagnosis Date  . COPD (chronic obstructive pulmonary disease) ([place])   . Coronary artery disease   . Hx of artificial heart valve replacement   . Hypertension   . Mitral insufficiency and aortic stenosis   . PVD (peripheral vascular disease) ([place])   . Rheumatic heart  disease    Past Surgical History:  Procedure Laterality Date  . AORTIC AND MITRAL VALVE REPLACEMENT  2013   [facility]?  . CORONARY ANGIOPLASTY WITH STENT PLACEMENT  10/2017  . LAPAROSCOPIC APPENDECTOMY N/A 05/17/2019   Procedure: INTERVAL LAPAROSCOPIC APPENDECTOMY;  Surgeon: Greer Pickerel, MD;  Location: [facility];  Service: General;  Laterality: N/A;  . MAZE  2013   atrial appendage ligation and maze procedure in 2013  Current Outpatient Medications on File Prior to Visit  Medication Sig Dispense Refill  . aspirin EC 81 MG tablet Take 81 mg by mouth daily.    Marland Kitchen atorvastatin (LIPITOR) 80 MG tablet Take 80 mg by mouth every evening.     . carvedilol (COREG) 3.125 MG tablet Take 3.125 mg by mouth 2 (two) times daily with a meal.     . enalapril (VASOTEC) 5 MG tablet Take 5 mg by mouth daily.    Marland Kitchen warfarin (COUMADIN) 5 MG tablet Take 5 mg by mouth See admin instructions. Take 1 tablet (5 mg) by mouth twice weekly in the evenings on Tuesdays & Fridays.    Marland Kitchen warfarin (COUMADIN) 7.5 MG tablet Take 7.5 mg by mouth See admin instructions. Take 1 tablet (7.5 mg) by mouth in the evenings on Mondays, Wednesdays, Thursdays, Saturdays, & Sundays.     No current facility-administered medications on file prior to visit.   No Known Allergies Social History   Socioeconomic History  . Marital status: Single    Spouse name: Not on file  . Number of children: Not on file  . Years of education: Not on file  . Highest education level: Not on file  Occupational History  . Not on file  Tobacco Use  . Smoking status: Former Smoker    Types: Cigarettes    Quit date: 04/01/2019    Years since quitting: 0.6  . Smokeless tobacco: Never Used  Vaping Use  . Vaping Use: Never used  Substance and Sexual Activity  . Alcohol use: Yes  . Drug use: Never  . Sexual activity: Not on file  Other Topics Concern  . Not on file  Social History Narrative  . Not on file   Social Determinants of Health   Financial  Resource Strain:   . Difficulty of Paying Living Expenses: Not on file  Food Insecurity:   . Worried About Charity fundraiser in the Last Year: Not on file  . Ran Out of Food in the Last Year: Not on file  Transportation Needs:   . Lack of Transportation (Medical): Not on file  . Lack of Transportation (Non-Medical): Not on file  Physical Activity:   . Days of Exercise per Week: Not on file  . Minutes of Exercise per Session: Not on file  Stress:   . Feeling of Stress : Not on file  Social Connections:   . Frequency of Communication with Friends and Family: Not on file  . Frequency of Social Gatherings with Friends and Family: Not on file  . Attends Religious Services: Not on file  . Active Member of Clubs or Organizations: Not on file  . Attends Archivist Meetings: Not on file  . Marital Status: Not on file  Intimate Partner Violence:   . Fear of Current or Ex-Partner: Not on file  . Emotionally Abused: Not on file  . Physically Abused: Not on file  . Sexually Abused: Not on file    Review of Systems  All other systems reviewed and are negative.      Objective:   Physical Exam Constitutional:      General: He is not in acute distress.    Appearance: Normal appearance. He is normal weight. He is not ill-appearing or toxic-appearing.  Cardiovascular:     Rate and Rhythm: Normal rate and regular rhythm.     Heart sounds: Murmur heard.   Pulmonary:     Effort: Pulmonary effort is normal. No  respiratory distress.     Breath sounds: Normal breath sounds. No stridor. No wheezing, rhonchi or rales.  Abdominal:     General: Abdomen is flat. Bowel sounds are normal. There is no distension.     Palpations: Abdomen is soft.     Tenderness: There is no abdominal tenderness. There is no guarding.  Musculoskeletal:     Right lower leg: No edema.     Left lower leg: No edema.  Neurological:     Mental Status: He is alert.           Assessment & Plan:  H/O  mitral valve replacement with mechanical valve  Paroxysmal atrial fibrillation (HCC)  Tobacco abuse  PVD (peripheral vascular disease) ([place])  Pure hypercholesterolemia  Coronary artery disease involving native coronary artery, angina presence unspecified, unspecified whether native or transplanted heart  Strongly recommended smoking cessation.  Coumadin is followed by his cardiologist at [facility] and is therapeutic.  Recent CBC has shown no significant anemia.  Liver and kidney function test are normal.  Blood sugar was outstanding.  LDL cholesterol was well below his goal of 70.  Blood pressure today is outstanding.  Recommended Pneumovax 23.  Recommended Cologuard.  Patient will consider Cologuard.  He politely declines Pneumovax 23 today.  Covid vaccination is up-to-date.

## 2019-12-02 ENCOUNTER — Telehealth: Payer: Self-pay | Admitting: Family Medicine

## 2019-12-02 NOTE — Telephone Encounter (Signed)
Gae Bon wife of patient states that he would like his pneumonia shot and the at home colonoscopy. She states that Dr. Dennard Schaumann had discussed both at his last visit.  CB# [phone]

## 2019-12-10 NOTE — Telephone Encounter (Signed)
Please schedule cologuard and he can come by for pneumovax 23

## 2019-12-10 NOTE — Telephone Encounter (Signed)
Received verbal orders for Cologuard.   Order placed via [facility].   Cologuard (Order [id number])

## 2019-12-15 ENCOUNTER — Encounter: Payer: Self-pay | Admitting: Family Medicine

## 2019-12-22 ENCOUNTER — Ambulatory Visit (INDEPENDENT_AMBULATORY_CARE_PROVIDER_SITE_OTHER): Payer: 59

## 2019-12-22 ENCOUNTER — Other Ambulatory Visit: Payer: Self-pay

## 2019-12-22 DIAGNOSIS — Z23 Encounter for immunization: Secondary | ICD-10-CM

## 2020-01-03 ENCOUNTER — Encounter: Payer: Self-pay | Admitting: Family Medicine

## 2020-01-03 LAB — COLOGUARD: Cologuard: POSITIVE — AB

## 2020-01-13 ENCOUNTER — Other Ambulatory Visit: Payer: Self-pay | Admitting: Family Medicine

## 2020-01-13 ENCOUNTER — Telehealth: Payer: Self-pay

## 2020-01-13 DIAGNOSIS — R195 Other fecal abnormalities: Secondary | ICD-10-CM

## 2020-01-13 NOTE — Telephone Encounter (Signed)
Spoke with Mr. [name] i

## 2020-01-25 ENCOUNTER — Telehealth: Payer: Self-pay | Admitting: Family Medicine

## 2020-01-25 NOTE — Telephone Encounter (Signed)
Call would like to get a referral for a colonoscopy [phone]

## 2020-01-28 NOTE — Telephone Encounter (Signed)
Calling back about his Colonoscopy referral  need for office notes also results fax to  [facility]  Fax # [phone]

## 2020-01-31 NOTE — Telephone Encounter (Signed)
Please F/U with this  

## 2020-02-01 NOTE — Telephone Encounter (Signed)
I have sent referral to [facility]. The office notes have been faxed to them.

## 2020-02-01 NOTE — Telephone Encounter (Signed)
Please F/U with this.  Thank you

## 2020-02-01 NOTE — Telephone Encounter (Signed)
Sent to Glass blower/designer for referral

## 2020-02-29 ENCOUNTER — Other Ambulatory Visit: Payer: Self-pay

## 2020-02-29 ENCOUNTER — Encounter: Payer: Self-pay | Admitting: Family Medicine

## 2020-02-29 ENCOUNTER — Ambulatory Visit (INDEPENDENT_AMBULATORY_CARE_PROVIDER_SITE_OTHER): Payer: 59 | Admitting: Family Medicine

## 2020-02-29 VITALS — BP 132/60 | HR 80 | Temp 98.2°F | Resp 14 | Ht 68.0 in | Wt 199.0 lb

## 2020-02-29 DIAGNOSIS — S80821A Blister (nonthermal), right lower leg, initial encounter: Secondary | ICD-10-CM | POA: Diagnosis not present

## 2020-02-29 DIAGNOSIS — I739 Peripheral vascular disease, unspecified: Secondary | ICD-10-CM | POA: Diagnosis not present

## 2020-02-29 DIAGNOSIS — I872 Venous insufficiency (chronic) (peripheral): Secondary | ICD-10-CM | POA: Diagnosis not present

## 2020-02-29 DIAGNOSIS — Z720 Tobacco use: Secondary | ICD-10-CM

## 2020-02-29 MED ORDER — CEPHALEXIN 500 MG PO CAPS: 500.0000 mg | ORAL_CAPSULE | Freq: Two times a day (BID) | ORAL | 0 refills | Status: DC

## 2020-02-29 NOTE — Assessment & Plan Note (Signed)
Patient here with worsening of his stasis dermatitis.  Believe that he had a venous stasis ulceration on his blistering over.  He does not remember any particular injury to the leg.  He has some trace edema that is causing some tightness down to the foot.  Reviewed history with him he does have vascular disease.  He is also a smoker smokes 6 to 7 cigarettes a day but states that he is cutting back.  He is on Coumadin therapy and a history of his A. fib and his valve replacement.  We will place a loose Unna boot to cover the large blistered area to improve healing.  We will also give him oral Keflex.  Advised him to check his toes and feet a few times a day if he noticed any significant discoloration he is to remove the boot.  Otherwise I will see him back in 3 days to have this removed.  He declines any pain medication today.

## 2020-02-29 NOTE — Patient Instructions (Addendum)
F/U Friday for unna boot removal and recheck - Dr. Buelah Manis Take antibiotics twice a day for a week

## 2020-02-29 NOTE — Progress Notes (Signed)
Subjective:    Patient ID: [name], male    DOB: 1955-07-01, 64 y.o.   MRN: [id number]  Patient presents for Skin Discoloration (vascular issues- open ulcer to lower R leg)  Patient here with discomfort to his right lower leg.  He has history of peripheral vascular disease and chronic venous stasis dermatitis but he states that he was not aware of this diagnosis.  I reviewed the notes he was seen by vascular in 2020.  He states his legs have always been scaling and dark for many years they swell on and off he wears compression hose.  He has had episodes where his skin will crack open wound sores but they will heal up.  A few days ago he looked down and noticed that his right leg pedal and crack and became raw looking in a fairly marked area.  It is now blistering over.  He has pain in the top of his foot because of the swelling.  He has not used anything topically on the leg to try to keep it dry and clean.     He was at dermatology [place]- recently had skin cancer on head and   Review Of Systems:  GEN- denies fatigue, fever, weight loss,weakness, recent illness HEENT- denies eye drainage, change in vision, nasal discharge, CVS- denies chest pain, palpitations RESP- denies SOB, cough, wheeze ABD- denies N/V, change in stools, abd pain GU- denies dysuria, hematuria, dribbling, incontinence MSK- + joint pain, muscle aches, injury Neuro- denies headache, dizziness, syncope, seizure activity       Objective:    BP 132/60   Pulse 80   Temp 98.2 F (36.8 C) (Temporal)   Resp 14   Ht 5\' 8"  (1.727 m)   Wt 199 lb (90.3 kg)   SpO2 96%   BMI 30.26 kg/m  GEN- NAD, alert and oriented x3 HEENT- PERRL, EOMI, non injected sclera,  Neck- Supple, no lymphadenopathy CVS- RRR, no murmur, click from valve RESP-few scattered wheeze  EXT-chronic venous stasis changes bilaterally with hyperpigmentation and dry scaling skin.  Right leg 11 x 8 cm superficial ulceration with blistering on  top mild tenderness to palpation no odor.  Trace swelling to the top of the foot, negative Homans Pulses- Radial, DP not able to palpate, toes cool to touch bilat         Assessment & Plan:      Problem List Items Addressed This Visit      Unprioritized   PVD (peripheral vascular disease) ([place]) - Primary    Patient here with worsening of his stasis dermatitis.  Believe that he had a venous stasis ulceration on his blistering over.  He does not remember any particular injury to the leg.  He has some trace edema that is causing some tightness down to the foot.  Reviewed history with him he does have vascular disease.  He is also a smoker smokes 6 to 7 cigarettes a day but states that he is cutting back.  He is on Coumadin therapy and a history of his A. fib and his valve replacement.  We will place a loose Unna boot to cover the large blistered area to improve healing.  We will also give him oral Keflex.  Advised him to check his toes and feet a few times a day if he noticed any significant discoloration he is to remove the boot.  Otherwise I will see him back in 3 days to have this removed.  He declines  any pain medication today.      Tobacco abuse    Other Visit Diagnoses    Venous stasis dermatitis of right lower extremity       Blister of right lower leg, initial encounter          Note: This dictation was prepared with Dragon dictation along with smaller phrase technology. Any transcriptional errors that result from this process are unintentional.

## 2020-03-03 ENCOUNTER — Encounter: Payer: Self-pay | Admitting: Family Medicine

## 2020-03-03 ENCOUNTER — Other Ambulatory Visit: Payer: Self-pay

## 2020-03-03 ENCOUNTER — Ambulatory Visit (INDEPENDENT_AMBULATORY_CARE_PROVIDER_SITE_OTHER): Payer: 59 | Admitting: Family Medicine

## 2020-03-03 VITALS — BP 128/66 | HR 66 | Temp 97.1°F | Resp 18

## 2020-03-03 DIAGNOSIS — I872 Venous insufficiency (chronic) (peripheral): Secondary | ICD-10-CM

## 2020-03-03 DIAGNOSIS — S80821D Blister (nonthermal), right lower leg, subsequent encounter: Secondary | ICD-10-CM | POA: Diagnosis not present

## 2020-03-03 DIAGNOSIS — I739 Peripheral vascular disease, unspecified: Secondary | ICD-10-CM | POA: Diagnosis not present

## 2020-03-03 DIAGNOSIS — S80821A Blister (nonthermal), right lower leg, initial encounter: Secondary | ICD-10-CM

## 2020-03-03 NOTE — Progress Notes (Signed)
   Subjective:    Patient ID: [name], male    DOB: February 11, 1956, 64 y.o.   MRN: [id number]  Patient presents for Follow-up [name] boot removal)   Patient here to follow-up peripheral vascular disease venous stasis blister ulceration.  Unna boot was placed 3 days ago.  He has not had any issues with the unna boot .  He has not noted any color changes in his toes.  He was unable to get the Keflex until yesterday from the pharmacy.  No fever or chills.  He has handicap placard for me to fill out    Review Of Systems:  GEN- denies fatigue, fever, weight loss,weakness, recent illness HEENT- denies eye drainage, change in vision, nasal discharge, CVS- denies chest pain, palpitations RESP- denies SOB, cough, wheeze MSK- denies joint pain, muscle aches, injury        Objective:    BP 128/66   Pulse 66   Temp (!) 97.1 F (36.2 C)   Resp 18   SpO2 99%  GEN- NAD, alert and oriented x3 RESP-normal WOB  EXT- s/p Unna boot removal  - chronic venous stasis changes bilaterally with hyperpigmentation and dry scaling skin.  Decreased swelling, no edema of foot Right leg 11 x 8 cm previous ulceration, skin in tact, no weeping, no drainage, NT    LLE chronic changes, mild edema  Pulses- Radial, DP not able to palpate, toes cool to touch bilat        Assessment & Plan:      Problem List Items Addressed This Visit      Unprioritized   PVD (peripheral vascular disease) ([place]) - Primary    Other Visit Diagnoses    Venous stasis dermatitis of right lower extremity       Much improved, s/p unna boot therapy, ulcerated lesion also healing over, complete keflex, high risk for decompensation, now use compression hose, counsled on tobacco use Elevate feet    Blister of right lower leg, initial encounter          Note: This dictation was prepared with Dragon dictation along with smaller Company secretary. Any transcriptional errors that result from this process are unintentional.

## 2020-03-03 NOTE — Patient Instructions (Addendum)
F/U Jan/Feb with Dr. Hermina Barters up antibiotis

## 2020-03-07 DIAGNOSIS — Z955 Presence of coronary angioplasty implant and graft: Secondary | ICD-10-CM | POA: Insufficient documentation

## 2020-03-07 DIAGNOSIS — I259 Chronic ischemic heart disease, unspecified: Secondary | ICD-10-CM | POA: Insufficient documentation

## 2020-03-16 ENCOUNTER — Other Ambulatory Visit: Payer: Self-pay | Admitting: Gastroenterology

## 2020-04-17 ENCOUNTER — Other Ambulatory Visit (HOSPITAL_COMMUNITY)
Admission: RE | Admit: 2020-04-17 | Discharge: 2020-04-17 | Disposition: A | Discharge status: Home / Self Care | Payer: 59 | Source: Ambulatory Visit | Attending: Gastroenterology | Admitting: Gastroenterology

## 2020-04-17 ENCOUNTER — Encounter (HOSPITAL_COMMUNITY): Payer: Self-pay | Admitting: Gastroenterology

## 2020-04-17 ENCOUNTER — Other Ambulatory Visit: Payer: Self-pay

## 2020-04-17 DIAGNOSIS — Z01818 Encounter for other preprocedural examination: Secondary | ICD-10-CM | POA: Diagnosis not present

## 2020-04-17 DIAGNOSIS — Z20822 Contact with and (suspected) exposure to covid-19: Secondary | ICD-10-CM | POA: Insufficient documentation

## 2020-04-17 LAB — SARS CORONAVIRUS 2 (TAT 6-24 HRS): SARS Coronavirus 2: NEGATIVE

## 2020-04-19 NOTE — H&P (Signed)
History of Present Illness  General:  64/male was referred for a positive cologuard test. Labs from 8/21 on EP[facility] was normal for CBC and CMP. No prior colonoscopy. He is on warfarin for the past 15 years, currently prescribed by his cardiologist Smitty Knudsen at [facility] for 2 artificail heart valves(mitral and aortic mechanical valves), he has surgery in 01/2012. He has needed bridging lovenox when he stopped coumadin when he has his appendix removed. His Bms are regular, denies blood in stool or black stools. Denies abdominal or rectal pain. Denies unintentional weight loss or loss of appetite. Denies nausea, vomiting, acid refkux or heartburn, denies difficulty or pain on swallowing. No family history of colon cancer.   Current Medications  Taking   Aspir-81   Carvedilol 6.25 MG Tablet 1 tablet with food Orally Twice a day, Notes: unknown doseage  Enalapril Maleate 10 MG Tablet 1 tablet Orally Once a day, Notes: unknown doseage  Warfarin Sodium 10 MG Tablet 1 tablet Orally Once a day, Notes: unknown doseage  Medication List reviewed and reconciled with the patient   Past Medical History  Melanoma.   Heart attack 2019.   A fib.   Stroke 2013.   COPD .    Surgical History  appendectomy   stent placement   mitral and aorta heart valve replacement   mole removal on chest    Family History  Mother: colon cancer, stomach cancer, kidney cancer, brain cancer,, diagnosed with Colon cancer  Son(s): deceased, leukemia  No fam hx of colon cancer, polyps, or liver disease.   Social History  General:  Tobacco use  cigarettes: Current smoker Tobacco history last updated 03/16/2020 Alcohol: yes, Rare.  no Recreational drug use.    Allergies  N.K.D.A.   Hospitalization/Major Diagnostic Procedure  Hospitalized summer time, nothing since then 03/2020   Review of Systems  GI PROCEDURE:  Pacemaker/ AICD no. Artificial heart valves YES, aortic and mitral. MI/heart attack  YES, Stent placed. Abnormal heart rhythm YES, , Atrial Fibrillation in the past . Angina no. CVA YES. Hypertension no. Hypotension no. Asthma, COPD YES, COPD. Sleep apnea no. Seizure disorders no. Artificial joints no. Severe DJD no. Diabetes no. Significant headaches no. Vertigo no. Depression/anxiety YES. Abnormal bleeding Taking blood thinners. Kidney Disease no. Liver disease no. Chance of pregnancy no. Blood transfusion no.      Vital Signs  Wt 203.6, Ht 69, BMI 30.06, Temp 98.7, Pulse sitting 84, BP sitting 136/60.   Examination  Gastroenterology:: GENERAL APPEARANCE: Well developed, well nourished, no active distress, pleasant.  SCLERA: anicteric.  CARDIOVASCULAR Normal RRR , loud clicks audible from mechanical valves.  RESPIRATORY Breath sounds normal. Respiration even and unlabored.  ABDOMEN No masses palpated. Liver and spleen not palpated, normal. Bowel sounds normal, Abdomen not distended.  EXTREMITIES: No edema.  NEURO: alert, oriented to time, place and person, normal gait.  PSYCH: mood/affect normal.     Assessments     1. Positive colorectal cancer screening using Cologuard test - R19.5 (Primary)   2. On warfarin at home - Z79.01   3. H/O mitral valve replacement - Z95.2   4. History of aortic valve replacement - Z95.2   Treatment  1. Positive colorectal cancer screening using Cologuard test  IMAGING: Colonoscopy    Powell,Amy 03/16/2020 03:37:32 PM > Pt scheduled for Colon [facility] 04/20/20 [facility] - gave instructions, rx, consents. Faxed request for hold to Dr Gwendolyn Fill   Notes: The risks and the benefits of the procedure were discussed with the  patient in details. He understands and verbalizes consent. Due to his comorbidities, need to hold warfarin and take bridging lovenox, recommend performing colonoscopy as an outpatient at [facility].    2. On warfarin at home  Notes: Will get approval and prescription from [place] /cardiologist at [facility] to hold warfarin for 5 days  before the procedue and patient will likley need bridging lovenix for 4 of those 5 days with plans to resume warfarin and lovenox post procedure.    3. H/O mitral valve replacement  Notes: Will get approval and prescription from [place] /cardiologist at [facility] to hold warfarin for 5 days before the procedue and patient will likley need bridging lovenox for 4 of those 5 days with plans to resume warfarin and lovenox post procedure.    4. History of aortic valve replacement  Notes: Will get approval and prescription from [place] /cardiologist at [facility] to hold warfarin for 5 days before the procedue and patient will likley need bridging lovenox for 4 of those 5 days with plans to resume warfarin and lovenox post procedure.

## 2020-04-20 ENCOUNTER — Ambulatory Visit (HOSPITAL_COMMUNITY): Payer: 59 | Admitting: Registered Nurse

## 2020-04-20 ENCOUNTER — Other Ambulatory Visit: Payer: Self-pay

## 2020-04-20 ENCOUNTER — Encounter (HOSPITAL_COMMUNITY)
Admission: RE | Disposition: A | Discharge status: Home / Self Care | Payer: Self-pay | Source: Home / Self Care | Attending: Gastroenterology

## 2020-04-20 ENCOUNTER — Ambulatory Visit (HOSPITAL_COMMUNITY)
Admission: RE | Admit: 2020-04-20 | Discharge: 2020-04-20 | Disposition: A | Discharge status: Home / Self Care | Payer: 59 | Attending: Gastroenterology | Admitting: Gastroenterology

## 2020-04-20 ENCOUNTER — Encounter (HOSPITAL_COMMUNITY): Payer: Self-pay | Admitting: Gastroenterology

## 2020-04-20 DIAGNOSIS — K621 Rectal polyp: Secondary | ICD-10-CM | POA: Insufficient documentation

## 2020-04-20 DIAGNOSIS — K5730 Diverticulosis of large intestine without perforation or abscess without bleeding: Secondary | ICD-10-CM | POA: Diagnosis not present

## 2020-04-20 DIAGNOSIS — Z79899 Other long term (current) drug therapy: Secondary | ICD-10-CM | POA: Insufficient documentation

## 2020-04-20 DIAGNOSIS — Z952 Presence of prosthetic heart valve: Secondary | ICD-10-CM | POA: Insufficient documentation

## 2020-04-20 DIAGNOSIS — K644 Residual hemorrhoidal skin tags: Secondary | ICD-10-CM | POA: Insufficient documentation

## 2020-04-20 DIAGNOSIS — D122 Benign neoplasm of ascending colon: Secondary | ICD-10-CM | POA: Insufficient documentation

## 2020-04-20 DIAGNOSIS — K635 Polyp of colon: Secondary | ICD-10-CM | POA: Diagnosis not present

## 2020-04-20 DIAGNOSIS — D120 Benign neoplasm of cecum: Secondary | ICD-10-CM | POA: Diagnosis not present

## 2020-04-20 DIAGNOSIS — K648 Other hemorrhoids: Secondary | ICD-10-CM | POA: Diagnosis not present

## 2020-04-20 DIAGNOSIS — R195 Other fecal abnormalities: Secondary | ICD-10-CM | POA: Diagnosis present

## 2020-04-20 HISTORY — PX: COLONOSCOPY WITH PROPOFOL: SHX5780

## 2020-04-20 HISTORY — PX: POLYPECTOMY: SHX5525

## 2020-04-20 HISTORY — PX: BIOPSY: SHX5522

## 2020-04-20 LAB — PROTIME-INR
INR: 1 (ref 0.8–1.2)
Prothrombin Time: 13 seconds (ref 11.4–15.2)

## 2020-04-20 SURGERY — COLONOSCOPY WITH PROPOFOL
Anesthesia: Monitor Anesthesia Care

## 2020-04-20 MED ORDER — SODIUM CHLORIDE 0.9 % IV SOLN: INTRAVENOUS | Status: DC

## 2020-04-20 MED ORDER — LIDOCAINE HCL (CARDIAC) PF 100 MG/5ML IV SOSY
PREFILLED_SYRINGE | INTRAVENOUS | Status: DC | PRN
  Administered 2020-04-20: 50 mg via INTRAVENOUS

## 2020-04-20 MED ORDER — PROPOFOL 1000 MG/100ML IV EMUL
INTRAVENOUS | Status: AC
  Filled 2020-04-20: qty 100

## 2020-04-20 MED ORDER — LACTATED RINGERS IV SOLN
INTRAVENOUS | Status: DC
  Administered 2020-04-20: 1000 mL via INTRAVENOUS

## 2020-04-20 MED ORDER — PROPOFOL 500 MG/50ML IV EMUL
INTRAVENOUS | Status: DC | PRN
  Administered 2020-04-20: 250 ug/kg/min via INTRAVENOUS

## 2020-04-20 MED ORDER — PROPOFOL 10 MG/ML IV BOLUS: INTRAVENOUS | Status: DC | PRN

## 2020-04-20 SURGICAL SUPPLY — 21 items

## 2020-04-20 NOTE — Interval H&P Note (Signed)
History and Physical Interval Note: 64/male with a positive cologuard for a diagnostic colonoscopy with propofol.  04/20/2020 9:53 AM  [name]  has presented today for colonoscopy, with the diagnosis of positive cologuard test.  The various methods of treatment have been discussed with the patient and family. After consideration of risks, benefits and other options for treatment, the patient has consented to  Procedure(s): COLONOSCOPY WITH PROPOFOL (N/A) as a surgical intervention.  The patient's history has been reviewed, patient examined, no change in status, stable for surgery.  I have reviewed the patient's chart and labs.  Questions were answered to the patient's satisfaction.     [name]

## 2020-04-20 NOTE — Brief Op Note (Signed)
04/20/2020  10:45 AM  PATIENT:  [name]  65 y.o. male  PRE-OPERATIVE DIAGNOSIS:  positive cologuard test  POST-OPERATIVE DIAGNOSIS:  Cecal, ascending,simoid x2 and rectal x 4 polpectomy,hemorrhoids  PROCEDURE:  Procedure(s): COLONOSCOPY WITH PROPOFOL (N/A) POLYPECTOMY  SURGEON:  Surgeon(s) and Role:    [name], MD - Primary  PHYSICIAN ASSISTANT:   ASSISTANTS:Mbumina Foustina,Tech, Charlane Ferretti  ANESTHESIA:   MAC  EBL: Minimal  BLOOD ADMINISTERED:none  DRAINS: none   LOCAL MEDICATIONS USED:  NONE  SPECIMEN:  Biopsy / Limited Resection  DISPOSITION OF SPECIMEN:  PATHOLOGY  COUNTS:  YES  TOURNIQUET:  * No tourniquets in log *  DICTATION: .Dragon Dictation  PLAN OF CARE: Discharge to home after PACU  PATIENT DISPOSITION:  PACU - hemodynamically stable.   Delay start of Pharmacological VTE agent (>24hrs) due to surgical blood loss or risk of bleeding: no

## 2020-04-20 NOTE — Op Note (Signed)
[facility] Patient Name: [name] Procedure Date: 04/20/2020 MRN: [id number] Attending MD: [name] , MD Date of Birth: Apr 26, 1955 CSN: [id number] Age: 65 Admit Type: Outpatient Procedure:                Colonoscopy Indications:              This is the patient's first colonoscopy, Positive                            Cologuard test Providers:                [name], MD, Particia Nearing, RN, Janee Morn,                            Technician Referring MD:             Flonnie Hailstone Medicines:                Monitored Anesthesia Care Complications:            No immediate complications. Estimated blood loss:                            Minimal. Estimated Blood Loss:     Estimated blood loss was minimal. Procedure:                Pre-Anesthesia Assessment:                           - Prior to the procedure, a History and Physical                            was performed, and patient medications and                            allergies were reviewed. The patient's tolerance of                            previous anesthesia was also reviewed. The risks                            and benefits of the procedure and the sedation                            options and risks were discussed with the patient.                            All questions were answered, and informed consent                            was obtained. Prior Anticoagulants: The patient has                            taken Coumadin (warfarin), last dose was 5 days                            prior to procedure. ASA  Grade Assessment: III - A                            patient with severe systemic disease. After                            reviewing the risks and benefits, the patient was                            deemed in satisfactory condition to undergo the                            procedure.                           After obtaining informed consent, the colonoscope                             was passed under direct vision. Throughout the                            procedure, the patient's blood pressure, pulse, and                            oxygen saturations were monitored continuously. The                            [id number]) Olympus pediatric colonscope                            was introduced through the anus and advanced to the                            the cecum, identified by appendiceal orifice and                            ileocecal valve. The colonoscopy was performed                            without difficulty. The patient tolerated the                            procedure well. The quality of the bowel                            preparation was fair. Scope In: 10:01:27 AM Scope Out: 10:34:16 AM Scope Withdrawal Time: 0 hours 23 minutes 50 seconds  Total Procedure Duration: 0 hours 32 minutes 49 seconds  Findings:      Skin tags were found on perianal exam.      Hemorrhoids were found on perianal exam.      Two sessile polyps were found in the cecum. The polyps were 4 to 20 mm       in size. These polyps were removed with a piecemeal technique using a       hot snare. Resection  and retrieval were complete.      A 4 mm polyp was found in the ascending colon. The polyp was sessile.       The polyp was removed with a cold biopsy forceps. Resection and       retrieval were complete.      Six sessile polyps were found in the rectum and sigmoid colon. The       polyps were 5 to 7 mm in size. These polyps were removed with a hot       snare. Resection and retrieval were complete.      A few small and large-mouthed diverticula were found in the sigmoid       colon and descending colon.      Non-bleeding internal hemorrhoids were found during retroflexion. Impression:               - Preparation of the colon was fair.                           - Perianal skin tags found on perianal exam.                           - Hemorrhoids found on perianal  exam.                           - Two 4 to 20 mm polyps in the cecum, removed                            piecemeal using a hot snare. Resected and retrieved.                           - One 4 mm polyp in the ascending colon, removed                            with a cold biopsy forceps. Resected and retrieved.                           - Six 5 to 7 mm polyps in the rectum and in the                            sigmoid colon, removed with a hot snare. Resected                            and retrieved.                           - Diverticulosis in the sigmoid colon and in the                            descending colon.                           - Non-bleeding internal hemorrhoids. Moderate Sedation:      Patient did not receive moderate sedation for this procedure, but       instead received monitored anesthesia care. Recommendation:           -  Patient has a contact number available for                            emergencies. The signs and symptoms of potential                            delayed complications were discussed with the                            patient. Return to normal activities tomorrow.                            Written discharge instructions were provided to the                            patient.                           - Resume regular diet.                           - Continue present medications.                           - Await pathology results.                           - Repeat colonoscopy for surveillance after                            piecemeal polypectomy depending on pathology. Procedure Code(s):        --- Professional ---                           [phone], Colonoscopy, flexible; with removal of                            tumor(s), polyp(s), or other lesion(s) by snare                            technique                           [id number], 58, Colonoscopy, flexible; with biopsy,                            single or multiple Diagnosis Code(s):         --- Professional ---                           K64.8, Other hemorrhoids                           K62.1, Rectal polyp                           K63.5, Polyp of colon  K64.4, Residual hemorrhoidal skin tags                           R19.5, Other fecal abnormalities                           K57.30, Diverticulosis of large intestine without                            perforation or abscess without bleeding CPT copyright 2019 American Medical Association. All rights reserved. The codes documented in this report are preliminary and upon coder review may  be revised to meet current compliance requirements. [name], MD 04/20/2020 10:45:19 AM This report has been signed electronically. Number of Addenda: 0

## 2020-04-20 NOTE — Anesthesia Postprocedure Evaluation (Signed)
Anesthesia Post Note  Patient: [name]  Procedure(s) Performed: COLONOSCOPY WITH PROPOFOL (N/A ) POLYPECTOMY     Patient location during evaluation: PACU Anesthesia Type: MAC Level of consciousness: awake and alert Pain management: pain level controlled Vital Signs Assessment: post-procedure vital signs reviewed and stable Respiratory status: spontaneous breathing, nonlabored ventilation, respiratory function stable and patient connected to nasal cannula oxygen Cardiovascular status: stable and blood pressure returned to baseline Postop Assessment: no apparent nausea or vomiting Anesthetic complications: no   No complications documented.  Last Vitals:  Vitals:   04/20/20 1041 04/20/20 1050  BP: (!) 143/74 (!) 160/71  Pulse: 71 71  Resp: 20 17  Temp: 36.6 C   SpO2: 100% 99%    Last Pain:  Vitals:   04/20/20 1041  TempSrc: Oral  PainSc: 0-No pain                 Effie Berkshire

## 2020-04-20 NOTE — Transfer of Care (Signed)
Immediate Anesthesia Transfer of Care Note  Patient: [name]  Procedure(s) Performed: COLONOSCOPY WITH PROPOFOL (N/A ) POLYPECTOMY  Patient Location: PACU  Anesthesia Type:MAC  Level of Consciousness: awake, sedated and patient cooperative  Airway & Oxygen Therapy: Patient Spontanous Breathing and Patient connected to face mask oxygen  Post-op Assessment: Report given to RN and Post -op Vital signs reviewed and stable  Post vital signs: stable  Last Vitals:  Vitals Value Taken Time  BP 143/74 04/20/20 1040  Temp    Pulse 69 04/20/20 1043  Resp 19 04/20/20 1043  SpO2 100 % 04/20/20 1043  Vitals shown include unvalidated device data.  Last Pain:  Vitals:   04/20/20 0844  TempSrc: Oral  PainSc: 0-No pain         Complications: No complications documented.

## 2020-04-20 NOTE — Anesthesia Preprocedure Evaluation (Addendum)
Anesthesia Evaluation  Patient identified by MRN, date of birth, ID band Patient awake    Reviewed: Allergy & Precautions, NPO status , Patient's Chart, lab work & pertinent test results  Airway Mallampati: I  TM Distance: >3 FB Neck ROM: Full    Dental  (+) Teeth Intact, Dental Advisory Given   Pulmonary COPD, Patient abstained from smoking., former smoker,    Pulmonary exam normal        Cardiovascular hypertension, + CAD, + Cardiac Stents and + Peripheral Vascular Disease   Rhythm:Regular Rate:Normal     Neuro/Psych negative neurological ROS  negative psych ROS   GI/Hepatic negative GI ROS, Neg liver ROS,   Endo/Other  negative endocrine ROS  Renal/GU negative Renal ROS     Musculoskeletal negative musculoskeletal ROS (+)   Abdominal Normal abdominal exam  (+)   Peds  Hematology negative hematology ROS (+)   Anesthesia Other Findings   Reproductive/Obstetrics                            Anesthesia Physical Anesthesia Plan  ASA: III  Anesthesia Plan: MAC   Post-op Pain Management:    Induction: Intravenous  PONV Risk Score and Plan: 0 and Propofol infusion  Airway Management Planned: Natural Airway and Simple Face Mask  Additional Equipment: None  Intra-op Plan:   Post-operative Plan:   Informed Consent: I have reviewed the patients History and Physical, chart, labs and discussed the procedure including the risks, benefits and alternatives for the proposed anesthesia with the patient or authorized representative who has indicated his/her understanding and acceptance.       Plan Discussed with: CRNA  Anesthesia Plan Comments:       Anesthesia Quick Evaluation

## 2020-04-21 LAB — SURGICAL PATHOLOGY

## 2020-04-22 ENCOUNTER — Encounter (HOSPITAL_COMMUNITY): Payer: Self-pay | Admitting: Gastroenterology

## 2021-03-08 ENCOUNTER — Encounter: Payer: Self-pay | Admitting: Family Medicine

## 2021-03-08 ENCOUNTER — Other Ambulatory Visit: Payer: Self-pay

## 2021-03-08 ENCOUNTER — Ambulatory Visit: Payer: 59 | Admitting: Family Medicine

## 2021-03-08 VITALS — BP 128/68 | HR 82 | Resp 18 | Ht 69.0 in | Wt 200.0 lb

## 2021-03-08 DIAGNOSIS — Z952 Presence of prosthetic heart valve: Secondary | ICD-10-CM | POA: Diagnosis not present

## 2021-03-08 DIAGNOSIS — Z0001 Encounter for general adult medical examination with abnormal findings: Secondary | ICD-10-CM | POA: Diagnosis not present

## 2021-03-08 DIAGNOSIS — Z955 Presence of coronary angioplasty implant and graft: Secondary | ICD-10-CM

## 2021-03-08 DIAGNOSIS — I739 Peripheral vascular disease, unspecified: Secondary | ICD-10-CM | POA: Diagnosis not present

## 2021-03-08 DIAGNOSIS — I83018 Varicose veins of right lower extremity with ulcer other part of lower leg: Secondary | ICD-10-CM

## 2021-03-08 DIAGNOSIS — Z7901 Long term (current) use of anticoagulants: Secondary | ICD-10-CM | POA: Insufficient documentation

## 2021-03-08 DIAGNOSIS — E7800 Pure hypercholesterolemia, unspecified: Secondary | ICD-10-CM

## 2021-03-08 DIAGNOSIS — L97811 Non-pressure chronic ulcer of other part of right lower leg limited to breakdown of skin: Secondary | ICD-10-CM

## 2021-03-08 DIAGNOSIS — Z0000 Encounter for general adult medical examination without abnormal findings: Secondary | ICD-10-CM

## 2021-03-08 DIAGNOSIS — I480 Paroxysmal atrial fibrillation: Secondary | ICD-10-CM

## 2021-03-08 MED ORDER — SILVER SULFADIAZINE 1 % EX CREA: 1.0000 "application " | TOPICAL_CREAM | Freq: Every day | CUTANEOUS | 0 refills | Status: DC

## 2021-03-08 NOTE — Progress Notes (Signed)
Subjective:    Patient ID: [name], male    DOB: July 03, 1955, 65 y.o.   MRN: [id number]  HPI Patient is a very pleasant 65 year old Caucasian male here today for a checkup.  He has a history of coronary artery disease, peripheral vascular disease, rheumatic heart disease with mitral valve repair with mechanical valve.  He is on lifelong Coumadin due to the valve repair.  He takes aspirin due to his history of coronary artery disease.  He also states that he had a stroke in the past.   However as soon as I walk in the door today, it is obvious that the patient has an open weeping wound on the anterior surface of the right leg.  It is roughly 9 cm long and 4 to 5 cm wide.  It is weeping serous fluid.  The surrounding skin is violaceous and purple in color due to chronic venous insufficiency.  The wound seems to be a venous stasis ulcer.  However he has a history of underlying peripheral vascular disease with an ABI of 0.7 in 2020.  TBI was 0.3.  He does report claudication with activity however the majority of his pain is when he is lying in bed at night holding his right leg is in a dependent position.  He states that it aches and throbs and swell.  It actually gets better when he is up moving around leading me to believe that this is due to chronic venous sensing and venous stasis ulcer.  Past Medical History:  Diagnosis Date   COPD (chronic obstructive pulmonary disease) ([place])    Coronary artery disease    Hx of artificial heart valve replacement    Hypertension    Mitral insufficiency and aortic stenosis    PVD (peripheral vascular disease) ([place])    Rheumatic heart disease    Past Surgical History:  Procedure Laterality Date   AORTIC AND MITRAL VALVE REPLACEMENT  2013   [facility]?   BIOPSY  04/20/2020   Procedure: BIOPSY;  Surgeon: [name], MD;  Location: [facility];  Service: Gastroenterology;;   COLONOSCOPY WITH PROPOFOL N/A 04/20/2020   Procedure: COLONOSCOPY WITH PROPOFOL;   Surgeon: [name], MD;  Location: [facility];  Service: Gastroenterology;  Laterality: N/A;   CORONARY ANGIOPLASTY WITH STENT PLACEMENT  10/2017   LAPAROSCOPIC APPENDECTOMY N/A 05/17/2019   Procedure: INTERVAL LAPAROSCOPIC APPENDECTOMY;  Surgeon: Greer Pickerel, MD;  Location: [facility];  Service: General;  Laterality: N/A;   MAZE  2013   atrial appendage ligation and maze procedure in 2013   POLYPECTOMY  04/20/2020   Procedure: POLYPECTOMY;  Surgeon: [name], MD;  Location: [facility];  Service: Gastroenterology;;   Current Outpatient Medications on File Prior to Visit  Medication Sig Dispense Refill   atorvastatin (LIPITOR) 80 MG tablet Take 80 mg by mouth every evening.      carvedilol (COREG) 6.25 MG tablet 1 tablet with food     enalapril (VASOTEC) 10 MG tablet 1 tablet     warfarin (COUMADIN) 6 MG tablet Take 6 mg by mouth daily.     No current facility-administered medications on file prior to visit.   No Known Allergies Social History   Socioeconomic History   Marital status: Single    Spouse name: Not on file   Number of children: Not on file   Years of education: Not on file   Highest education level: Not on file  Occupational History   Not on file  Tobacco Use  Smoking status: Former    Types: Cigarettes    Quit date: 04/01/2019    Years since quitting: 1.9   Smokeless tobacco: Never  Vaping Use   Vaping Use: Never used  Substance and Sexual Activity   Alcohol use: Yes   Drug use: Never   Sexual activity: Not on file  Other Topics Concern   Not on file  Social History Narrative   Not on file   Social Determinants of Health   Financial Resource Strain: Not on file  Food Insecurity: Not on file  Transportation Needs: Not on file  Physical Activity: Not on file  Stress: Not on file  Social Connections: Not on file  Intimate Partner Violence: Not on file    Review of Systems  All other systems reviewed and are negative.     Objective:    Physical Exam Constitutional:      General: He is not in acute distress.    Appearance: Normal appearance. He is normal weight. He is not ill-appearing or toxic-appearing.  Cardiovascular:     Rate and Rhythm: Normal rate and regular rhythm.     Heart sounds: Murmur heard.  Pulmonary:     Effort: Pulmonary effort is normal. No respiratory distress.     Breath sounds: Normal breath sounds. No stridor. No wheezing, rhonchi or rales.  Abdominal:     General: Abdomen is flat. Bowel sounds are normal. There is no distension.     Palpations: Abdomen is soft.     Tenderness: There is no abdominal tenderness. There is no guarding.  Musculoskeletal:     Right lower leg: Deformity present. Pitting Edema present.     Left lower leg: Pitting Edema present.       Legs:  Neurological:     Mental Status: He is alert.    Patient has palpable dorsalis pedis pulses bilaterally and less than 3-second capillary refill in his toes.  There is no evidence of gangrene.      Assessment & Plan:  General medical exam  PVD (peripheral vascular disease) ([place]) - Plan: CBC with Differential/Platelet, Lipid panel, COMPLETE METABOLIC PANEL WITH GFR  H/O mitral valve replacement with mechanical valve  Paroxysmal atrial fibrillation (H[facility])  Pure hypercholesterolemia  Status post coronary artery stent placement  Venous stasis ulcer of other part of right lower leg limited to breakdown of skin with varicose veins ([place]) Today's appointment was initially for a physical.  However I am concerned about the venous stasis ulcer on his right leg.  This should be treated.  Typically I would use Unna boot and compression therapy however I am concerned by his history of peripheral vascular disease and I do not want to exacerbate the problem further tight compression wraps.  Therefore we will get the patient back into see vein and vascular.  He last saw Dr. Donzetta Matters in 2020.  I would defer to vascular's opinion about compression  wrap therapy versus venous ablation.  They may also want to repeat arterial studies to rule out limb threatening ischemia prior to any treatment.  In the meantime I will treat the ulcer with Silvadene cream applied a nonadherent gauze wrapped with an Ace wrap to prevent secondary infection.  Blood pressure today is excellent.  I will schedule the patient for lab work.  He is already on Coumadin appropriately treated for his mechanical valve replacement, aspirin, and high-dose statin

## 2021-03-09 LAB — LIPID PANEL
Cholesterol: 128 mg/dL (ref <200)
HDL: 48 mg/dL (ref >=40)
LDL Cholesterol (Calc): 64 mg/dL (calc)
Non-HDL Cholesterol (Calc): 80 mg/dL (calc) (ref <130)
Total CHOL/HDL Ratio: 2.7 (calc) (ref <5.0)
Triglycerides: 78 mg/dL (ref <150)

## 2021-03-09 LAB — CBC WITH DIFFERENTIAL/PLATELET
Absolute Monocytes: 805 cells/uL (ref 200–950)
Basophils Absolute: 33 cells/uL (ref 0–200)
Basophils Relative: 0.5 %
Eosinophils Absolute: 112 cells/uL (ref 15–500)
Eosinophils Relative: 1.7 %
HCT: 43.3 % (ref 38.5–50.0)
Hemoglobin: 14.1 g/dL (ref 13.2–17.1)
Lymphs Abs: 818 cells/uL — ABNORMAL LOW (ref 850–3900)
MCH: 29.7 pg (ref 27.0–33.0)
MCHC: 32.6 g/dL (ref 32.0–36.0)
MCV: 91.2 fL (ref 80.0–100.0)
MPV: 11.4 fL (ref 7.5–12.5)
Monocytes Relative: 12.2 %
Neutro Abs: 4831 cells/uL (ref 1500–7800)
Neutrophils Relative %: 73.2 %
Platelets: 190 10*3/uL (ref 140–400)
RBC: 4.75 10*6/uL (ref 4.20–5.80)
RDW: 12.6 % (ref 11.0–15.0)
Total Lymphocyte: 12.4 %
WBC: 6.6 10*3/uL (ref 3.8–10.8)

## 2021-03-09 LAB — COMPLETE METABOLIC PANEL WITH GFR
AG Ratio: 1.6 (calc) (ref 1.0–2.5)
ALT: 15 U/L (ref 9–46)
AST: 16 U/L (ref 10–35)
Albumin: 3.9 g/dL (ref 3.6–5.1)
Alkaline phosphatase (APISO): 114 U/L (ref 35–144)
BUN: 16 mg/dL (ref 7–25)
CO2: 30 mmol/L (ref 20–32)
Calcium: 9.1 mg/dL (ref 8.6–10.3)
Chloride: 105 mmol/L (ref 98–110)
Creat: 0.9 mg/dL (ref 0.70–1.35)
Globulin: 2.4 g/dL (calc) (ref 1.9–3.7)
Glucose, Bld: 101 mg/dL — ABNORMAL HIGH (ref 65–99)
Potassium: 4.7 mmol/L (ref 3.5–5.3)
Sodium: 141 mmol/L (ref 135–146)
Total Bilirubin: 0.6 mg/dL (ref 0.2–1.2)
Total Protein: 6.3 g/dL (ref 6.1–8.1)
eGFR: 95 mL/min/{1.73_m2} (ref >=60)

## 2021-03-14 ENCOUNTER — Other Ambulatory Visit: Payer: Self-pay

## 2021-03-14 ENCOUNTER — Ambulatory Visit: Payer: 59 | Admitting: Vascular Surgery

## 2021-03-14 DIAGNOSIS — I83009 Varicose veins of unspecified lower extremity with ulcer of unspecified site: Secondary | ICD-10-CM

## 2021-03-14 DIAGNOSIS — I739 Peripheral vascular disease, unspecified: Secondary | ICD-10-CM

## 2021-03-15 ENCOUNTER — Other Ambulatory Visit: Payer: Self-pay

## 2021-03-15 ENCOUNTER — Ambulatory Visit (HOSPITAL_COMMUNITY)
Admission: RE | Admit: 2021-03-15 | Discharge: 2021-03-15 | Disposition: A | Discharge status: Home / Self Care | Payer: Medicare Other | Source: Ambulatory Visit | Attending: Vascular Surgery | Admitting: Vascular Surgery

## 2021-03-15 DIAGNOSIS — L97909 Non-pressure chronic ulcer of unspecified part of unspecified lower leg with unspecified severity: Secondary | ICD-10-CM | POA: Insufficient documentation

## 2021-03-15 DIAGNOSIS — I83019 Varicose veins of right lower extremity with ulcer of unspecified site: Secondary | ICD-10-CM | POA: Diagnosis not present

## 2021-03-15 DIAGNOSIS — I83009 Varicose veins of unspecified lower extremity with ulcer of unspecified site: Secondary | ICD-10-CM | POA: Insufficient documentation

## 2021-03-16 ENCOUNTER — Ambulatory Visit (HOSPITAL_COMMUNITY)
Admission: RE | Admit: 2021-03-16 | Discharge: 2021-03-16 | Disposition: A | Discharge status: Home / Self Care | Payer: Medicare Other | Source: Ambulatory Visit | Attending: Vascular Surgery | Admitting: Vascular Surgery

## 2021-03-16 DIAGNOSIS — I739 Peripheral vascular disease, unspecified: Secondary | ICD-10-CM | POA: Diagnosis present

## 2021-03-21 ENCOUNTER — Other Ambulatory Visit: Payer: Self-pay

## 2021-03-21 ENCOUNTER — Ambulatory Visit: Payer: Medicare Other | Admitting: Vascular Surgery

## 2021-03-21 ENCOUNTER — Encounter: Payer: Self-pay | Admitting: Vascular Surgery

## 2021-03-21 VITALS — BP 122/58 | HR 84 | Temp 98.3°F | Resp 20 | Ht 69.0 in | Wt 200.0 lb

## 2021-03-21 DIAGNOSIS — I739 Peripheral vascular disease, unspecified: Secondary | ICD-10-CM | POA: Diagnosis not present

## 2021-03-21 DIAGNOSIS — I83009 Varicose veins of unspecified lower extremity with ulcer of unspecified site: Secondary | ICD-10-CM | POA: Diagnosis not present

## 2021-03-21 DIAGNOSIS — L97909 Non-pressure chronic ulcer of unspecified part of unspecified lower leg with unspecified severity: Secondary | ICD-10-CM

## 2021-03-21 NOTE — Progress Notes (Signed)
Patient ID: [name], male   DOB: 04-29-1955, 65 y.o.   MRN: [id number]  Reason for Consult: Follow-up   Referred by [name], MD  Subjective:     HPI:  [name] is a 65 y.o. male with history of C4 venous disease.  He also has history of coronary artery disease and has artificial valve heart replacement for which he takes Coumadin.  He is followed by cardiology at [facility].  He now developed a wound on his right lower extremity approximately 3 weeks ago.  Initially this was very exquisitely painful with any palpation and with walking.  He has now been seen by wound care he has been compression wraps for the past 2 weeks and his pain is much improved and the wound is healing.  There is been consideration of Unna boot but with known underlying arterial disease they have been reticent to start that.  Patient does continue to smoke.  He walks without limitation of his left lower extremity although he does have skin changes consistent with C4 venous disease there as well.  He has never had any lower extremity arterial or venous interventions.  Past Medical History:  Diagnosis Date   COPD (chronic obstructive pulmonary disease) ([place])    Coronary artery disease    Hx of artificial heart valve replacement    Hypertension    Mitral insufficiency and aortic stenosis    PVD (peripheral vascular disease) ([place])    Rheumatic heart disease    Family History  Problem Relation Age of Onset   Bladder Cancer Mother    Lung cancer Mother    Brain cancer Mother    Heart disease Father    Heart disease Sister    Diabetes Maternal Grandfather    Past Surgical History:  Procedure Laterality Date   AORTIC AND MITRAL VALVE REPLACEMENT  2013   [facility]?   BIOPSY  04/20/2020   Procedure: BIOPSY;  Surgeon: [name], MD;  Location: [facility];  Service: Gastroenterology;;   COLONOSCOPY WITH PROPOFOL N/A 04/20/2020   Procedure: COLONOSCOPY WITH PROPOFOL;  Surgeon:  [name], MD;  Location: [facility];  Service: Gastroenterology;  Laterality: N/A;   CORONARY ANGIOPLASTY WITH STENT PLACEMENT  10/2017   LAPAROSCOPIC APPENDECTOMY N/A 05/17/2019   Procedure: INTERVAL LAPAROSCOPIC APPENDECTOMY;  Surgeon: Greer Pickerel, MD;  Location: [facility];  Service: General;  Laterality: N/A;   MAZE  2013   atrial appendage ligation and maze procedure in 2013   POLYPECTOMY  04/20/2020   Procedure: POLYPECTOMY;  Surgeon: [name], MD;  Location: [facility];  Service: Gastroenterology;;    Short Social History:  Social History   Tobacco Use   Smoking status: Some Days    Types: Cigarettes   Smokeless tobacco: Never  Substance Use Topics   Alcohol use: Yes    No Known Allergies  Current Outpatient Medications  Medication Sig Dispense Refill   aspirin EC 81 MG tablet Take 81 mg by mouth daily. Swallow whole.     atorvastatin (LIPITOR) 80 MG tablet Take 80 mg by mouth every evening.      carvedilol (COREG) 6.25 MG tablet 1 tablet with food     enalapril (VASOTEC) 10 MG tablet 1 tablet     silver sulfADIAZINE (SILVADENE) 1 % cream Apply 1 application topically daily. 50 g 0   warfarin (COUMADIN) 6 MG tablet Take 6 mg by mouth daily.     No current facility-administered medications for this visit.  Review of Systems  Constitutional:  Constitutional negative. HENT: HENT negative.  Eyes: Eyes negative.  Respiratory: Respiratory negative.  Cardiovascular: Positive for leg swelling.  GI: Gastrointestinal negative.  Musculoskeletal: Positive for leg pain.  Skin: Positive for wound.  Neurological: Neurological negative. Hematologic: Hematologic/lymphatic negative.  Psychiatric: Psychiatric negative.       Objective:  Objective   Vitals:   03/21/21 1132  BP: (!) 122/58  Pulse: 84  Resp: 20  Temp: 98.3 F (36.8 C)  SpO2: 98%    Physical Exam HENT:     Head: Normocephalic.     Nose:     Comments: Wearing a mask Eyes:     Pupils: Pupils  are equal, round, and reactive to light.  Cardiovascular:     Rate and Rhythm: Normal rate.     Pulses:          Femoral pulses are 1+ on the right side and 2+ on the left side.      Popliteal pulses are 0 on the right side and 0 on the left side.  Pulmonary:     Effort: Pulmonary effort is normal.  Abdominal:     General: Abdomen is flat.     Palpations: Abdomen is soft. There is no mass.  Musculoskeletal:        General: Normal range of motion.     Cervical back: Normal range of motion and neck supple.     Right lower leg: Edema present.     Left lower leg: No edema.  Skin:    Capillary Refill: Capillary refill takes 2 to 3 seconds.     Comments: Bilateral hemosiderin deposition bilateral lower extremities, wounds as pictured below  Neurological:     General: No focal deficit present.     Mental Status: He is alert.  Psychiatric:        Mood and Affect: Mood normal.       Data: ABI Findings:  +---------+------------------+-----+--------+--------+   Right     Rt Pressure (mmHg) Index Waveform Comment    +---------+------------------+-----+--------+--------+   Brachial  135                                          +---------+------------------+-----+--------+--------+   PTA       100                0.74  biphasic            +---------+------------------+-----+--------+--------+   DP        111                0.82  biphasic            +---------+------------------+-----+--------+--------+   Great Toe 40                 0.29                      +---------+------------------+-----+--------+--------+   +---------+------------------+-----+--------+-------+   Left      Lt Pressure (mmHg) Index Waveform Comment   +---------+------------------+-----+--------+-------+   Brachial  136                                         +---------+------------------+-----+--------+-------+   PTA  119                0.88  biphasic            +---------+------------------+-----+--------+-------+   DP        116                0.85  biphasic           +---------+------------------+-----+--------+-------+   Great Toe 37                 0.27                     +---------+------------------+-----+--------+-------+   +-------+-----------+-----------+------------+------------+   ABI/TBI Today's ABI Today's TBI Previous ABI Previous TBI   +-------+-----------+-----------+------------+------------+   Right   0.82        0.29        0.74         0.33           +-------+-----------+-----------+------------+------------+   Left    0.88        0.27        1.16         0.36           +-------+-----------+-----------+------------+------------+     Venous Reflux Times  +--------------+---------+------+-----------+------------+--------+   RIGHT          Reflux No Reflux Reflux Time Diameter cms Comments                              Yes                                       +--------------+---------+------+-----------+------------+--------+   CFV                       yes    >1 second                          +--------------+---------+------+-----------+------------+--------+   FV prox                   yes    >1 second                          +--------------+---------+------+-----------+------------+--------+   FV mid                    yes    >1 second                          +--------------+---------+------+-----------+------------+--------+   FV dist                   yes    >1 second                          +--------------+---------+------+-----------+------------+--------+   Popliteal                 yes    >1 second                          +--------------+---------+------+-----------+------------+--------+   GSV at [facility]  yes     >500 ms      0.736                +--------------+---------+------+-----------+------------+--------+   GSV prox thigh            yes     >500 ms      0.591                 +--------------+---------+------+-----------+------------+--------+   GSV mid thigh             yes     >500 ms      0.591                +--------------+---------+------+-----------+------------+--------+   GSV dist thigh            yes     >500 ms      0.546                +--------------+---------+------+-----------+------------+--------+   GSV at knee    no                              0.592                +--------------+---------+------+-----------+------------+--------+   GSV prox calf                                  0.441                +--------------+---------+------+-----------+------------+--------+   GSV mid calf                                   0.461                +--------------+---------+------+-----------+------------+--------+   SSV Pop Fossa  no                              0.406                +--------------+---------+------+-----------+------------+--------+   SSV prox calf             yes     >500 ms      0.379                +--------------+---------+------+-----------+------------+--------+   SSV mid calf                                   0.379                +--------------+---------+------+-----------+------------+--------+           Summary:  Right:  - No evidence of deep vein thrombosis seen in the right lower extremity,  from the common femoral through the popliteal veins.  - No evidence of superficial venous thrombosis in the right lower  extremity.  - Venous reflux is noted in the right common femoral vein.  - Venous reflux is noted in the right sapheno-femoral junction.  - Venous reflux is noted in the right greater saphenous vein in the thigh.  - Venous reflux is noted in the right femoral vein.  - Venous reflux is noted in the right popliteal vein.  - Venous  reflux is noted in the right short saphenous vein.       Assessment/Plan:     65 year old male with right lower extremity wound that is mixed arterial and venous.  We will begin with  angiography from a left common femoral approach to treat the right lower extremity.  If he has short segment occlusive disease we will plan for stenting and subsequent work-up for venous ablation of a large great saphenous vein.  If he has long segment occlusion we will plan for bypass of the right lower extremity and he will need cardiac clearance at [facility] prior to this.  We will need to bridge his Coumadin with Lovenox given that he has artificial heart valves.  I discussed the expected outcomes with the patient and he demonstrates good understanding with his wife present.     Waynetta Sandy MD [facility]

## 2021-03-21 NOTE — H&P (View-Only) (Signed)
Patient ID: [name], male   DOB: 12-03-1955, 65 y.o.   MRN: [id number]  Reason for Consult: Follow-up   Referred by [name], MD  Subjective:     HPI:  [name] is a 65 y.o. male with history of C4 venous disease.  He also has history of coronary artery disease and has artificial valve heart replacement for which he takes Coumadin.  He is followed by cardiology at [facility].  He now developed a wound on his right lower extremity approximately 3 weeks ago.  Initially this was very exquisitely painful with any palpation and with walking.  He has now been seen by wound care he has been compression wraps for the past 2 weeks and his pain is much improved and the wound is healing.  There is been consideration of Unna boot but with known underlying arterial disease they have been reticent to start that.  Patient does continue to smoke.  He walks without limitation of his left lower extremity although he does have skin changes consistent with C4 venous disease there as well.  He has never had any lower extremity arterial or venous interventions.  Past Medical History:  Diagnosis Date   COPD (chronic obstructive pulmonary disease) ([place])    Coronary artery disease    Hx of artificial heart valve replacement    Hypertension    Mitral insufficiency and aortic stenosis    PVD (peripheral vascular disease) ([place])    Rheumatic heart disease    Family History  Problem Relation Age of Onset   Bladder Cancer Mother    Lung cancer Mother    Brain cancer Mother    Heart disease Father    Heart disease Sister    Diabetes Maternal Grandfather    Past Surgical History:  Procedure Laterality Date   AORTIC AND MITRAL VALVE REPLACEMENT  2013   [facility]?   BIOPSY  04/20/2020   Procedure: BIOPSY;  Surgeon: [name], MD;  Location: [facility];  Service: Gastroenterology;;   COLONOSCOPY WITH PROPOFOL N/A 04/20/2020   Procedure: COLONOSCOPY WITH PROPOFOL;  Surgeon:  [name], MD;  Location: [facility];  Service: Gastroenterology;  Laterality: N/A;   CORONARY ANGIOPLASTY WITH STENT PLACEMENT  10/2017   LAPAROSCOPIC APPENDECTOMY N/A 05/17/2019   Procedure: INTERVAL LAPAROSCOPIC APPENDECTOMY;  Surgeon: Greer Pickerel, MD;  Location: [facility];  Service: General;  Laterality: N/A;   MAZE  2013   atrial appendage ligation and maze procedure in 2013   POLYPECTOMY  04/20/2020   Procedure: POLYPECTOMY;  Surgeon: [name], MD;  Location: [facility];  Service: Gastroenterology;;    Short Social History:  Social History   Tobacco Use   Smoking status: Some Days    Types: Cigarettes   Smokeless tobacco: Never  Substance Use Topics   Alcohol use: Yes    No Known Allergies  Current Outpatient Medications  Medication Sig Dispense Refill   aspirin EC 81 MG tablet Take 81 mg by mouth daily. Swallow whole.     atorvastatin (LIPITOR) 80 MG tablet Take 80 mg by mouth every evening.      carvedilol (COREG) 6.25 MG tablet 1 tablet with food     enalapril (VASOTEC) 10 MG tablet 1 tablet     silver sulfADIAZINE (SILVADENE) 1 % cream Apply 1 application topically daily. 50 g 0   warfarin (COUMADIN) 6 MG tablet Take 6 mg by mouth daily.     No current facility-administered medications for this visit.  Review of Systems  Constitutional:  Constitutional negative. HENT: HENT negative.  Eyes: Eyes negative.  Respiratory: Respiratory negative.  Cardiovascular: Positive for leg swelling.  GI: Gastrointestinal negative.  Musculoskeletal: Positive for leg pain.  Skin: Positive for wound.  Neurological: Neurological negative. Hematologic: Hematologic/lymphatic negative.  Psychiatric: Psychiatric negative.       Objective:  Objective   Vitals:   03/21/21 1132  BP: (!) 122/58  Pulse: 84  Resp: 20  Temp: 98.3 F (36.8 C)  SpO2: 98%    Physical Exam HENT:     Head: Normocephalic.     Nose:     Comments: Wearing a mask Eyes:     Pupils: Pupils  are equal, round, and reactive to light.  Cardiovascular:     Rate and Rhythm: Normal rate.     Pulses:          Femoral pulses are 1+ on the right side and 2+ on the left side.      Popliteal pulses are 0 on the right side and 0 on the left side.  Pulmonary:     Effort: Pulmonary effort is normal.  Abdominal:     General: Abdomen is flat.     Palpations: Abdomen is soft. There is no mass.  Musculoskeletal:        General: Normal range of motion.     Cervical back: Normal range of motion and neck supple.     Right lower leg: Edema present.     Left lower leg: No edema.  Skin:    Capillary Refill: Capillary refill takes 2 to 3 seconds.     Comments: Bilateral hemosiderin deposition bilateral lower extremities, wounds as pictured below  Neurological:     General: No focal deficit present.     Mental Status: He is alert.  Psychiatric:        Mood and Affect: Mood normal.       Data: ABI Findings:  +---------+------------------+-----+--------+--------+   Right     Rt Pressure (mmHg) Index Waveform Comment    +---------+------------------+-----+--------+--------+   Brachial  135                                          +---------+------------------+-----+--------+--------+   PTA       100                0.74  biphasic            +---------+------------------+-----+--------+--------+   DP        111                0.82  biphasic            +---------+------------------+-----+--------+--------+   Great Toe 40                 0.29                      +---------+------------------+-----+--------+--------+   +---------+------------------+-----+--------+-------+   Left      Lt Pressure (mmHg) Index Waveform Comment   +---------+------------------+-----+--------+-------+   Brachial  136                                         +---------+------------------+-----+--------+-------+   PTA  119                0.88  biphasic            +---------+------------------+-----+--------+-------+   DP        116                0.85  biphasic           +---------+------------------+-----+--------+-------+   Great Toe 37                 0.27                     +---------+------------------+-----+--------+-------+   +-------+-----------+-----------+------------+------------+   ABI/TBI Today's ABI Today's TBI Previous ABI Previous TBI   +-------+-----------+-----------+------------+------------+   Right   0.82        0.29        0.74         0.33           +-------+-----------+-----------+------------+------------+   Left    0.88        0.27        1.16         0.36           +-------+-----------+-----------+------------+------------+     Venous Reflux Times  +--------------+---------+------+-----------+------------+--------+   RIGHT          Reflux No Reflux Reflux Time Diameter cms Comments                              Yes                                       +--------------+---------+------+-----------+------------+--------+   CFV                       yes    >1 second                          +--------------+---------+------+-----------+------------+--------+   FV prox                   yes    >1 second                          +--------------+---------+------+-----------+------------+--------+   FV mid                    yes    >1 second                          +--------------+---------+------+-----------+------------+--------+   FV dist                   yes    >1 second                          +--------------+---------+------+-----------+------------+--------+   Popliteal                 yes    >1 second                          +--------------+---------+------+-----------+------------+--------+   GSV at [facility]  yes     >500 ms      0.736                +--------------+---------+------+-----------+------------+--------+   GSV prox thigh            yes     >500 ms      0.591                 +--------------+---------+------+-----------+------------+--------+   GSV mid thigh             yes     >500 ms      0.591                +--------------+---------+------+-----------+------------+--------+   GSV dist thigh            yes     >500 ms      0.546                +--------------+---------+------+-----------+------------+--------+   GSV at knee    no                              0.592                +--------------+---------+------+-----------+------------+--------+   GSV prox calf                                  0.441                +--------------+---------+------+-----------+------------+--------+   GSV mid calf                                   0.461                +--------------+---------+------+-----------+------------+--------+   SSV Pop Fossa  no                              0.406                +--------------+---------+------+-----------+------------+--------+   SSV prox calf             yes     >500 ms      0.379                +--------------+---------+------+-----------+------------+--------+   SSV mid calf                                   0.379                +--------------+---------+------+-----------+------------+--------+           Summary:  Right:  - No evidence of deep vein thrombosis seen in the right lower extremity,  from the common femoral through the popliteal veins.  - No evidence of superficial venous thrombosis in the right lower  extremity.  - Venous reflux is noted in the right common femoral vein.  - Venous reflux is noted in the right sapheno-femoral junction.  - Venous reflux is noted in the right greater saphenous vein in the thigh.  - Venous reflux is noted in the right femoral vein.  - Venous reflux is noted in the right popliteal vein.  - Venous  reflux is noted in the right short saphenous vein.       Assessment/Plan:     65 year old male with right lower extremity wound that is mixed arterial and venous.  We will begin with  angiography from a left common femoral approach to treat the right lower extremity.  If he has short segment occlusive disease we will plan for stenting and subsequent work-up for venous ablation of a large great saphenous vein.  If he has long segment occlusion we will plan for bypass of the right lower extremity and he will need cardiac clearance at [facility] prior to this.  We will need to bridge his Coumadin with Lovenox given that he has artificial heart valves.  I discussed the expected outcomes with the patient and he demonstrates good understanding with his wife present.     Waynetta Sandy MD [facility]

## 2021-04-16 ENCOUNTER — Encounter (HOSPITAL_COMMUNITY)
Admission: RE | Disposition: A | Discharge status: Home / Self Care | Payer: Self-pay | Source: Home / Self Care | Attending: Vascular Surgery

## 2021-04-16 ENCOUNTER — Ambulatory Visit (HOSPITAL_COMMUNITY)
Admission: RE | Admit: 2021-04-16 | Discharge: 2021-04-16 | Disposition: A | Discharge status: Home / Self Care | Payer: Medicare Other | Attending: Vascular Surgery | Admitting: Vascular Surgery

## 2021-04-16 ENCOUNTER — Encounter (HOSPITAL_COMMUNITY): Payer: Self-pay | Admitting: Vascular Surgery

## 2021-04-16 DIAGNOSIS — I739 Peripheral vascular disease, unspecified: Secondary | ICD-10-CM | POA: Insufficient documentation

## 2021-04-16 DIAGNOSIS — I70238 Atherosclerosis of native arteries of right leg with ulceration of other part of lower right leg: Secondary | ICD-10-CM | POA: Diagnosis not present

## 2021-04-16 DIAGNOSIS — F17200 Nicotine dependence, unspecified, uncomplicated: Secondary | ICD-10-CM | POA: Insufficient documentation

## 2021-04-16 DIAGNOSIS — I2510 Atherosclerotic heart disease of native coronary artery without angina pectoris: Secondary | ICD-10-CM | POA: Insufficient documentation

## 2021-04-16 HISTORY — PX: ABDOMINAL AORTOGRAM W/LOWER EXTREMITY: CATH118223

## 2021-04-16 LAB — PROTIME-INR
INR: 1.2 (ref 0.8–1.2)
Prothrombin Time: 14.8 seconds (ref 11.4–15.2)

## 2021-04-16 LAB — POCT I-STAT, CHEM 8
BUN: 24 mg/dL — ABNORMAL HIGH (ref 8–23)
Calcium, Ion: 1.12 mmol/L — ABNORMAL LOW (ref 1.15–1.40)
Chloride: 104 mmol/L (ref 98–111)
Creatinine, Ser: 0.8 mg/dL (ref 0.61–1.24)
Glucose, Bld: 110 mg/dL — ABNORMAL HIGH (ref 70–99)
HCT: 43 % (ref 39.0–52.0)
Hemoglobin: 14.6 g/dL (ref 13.0–17.0)
Potassium: 5.4 mmol/L — ABNORMAL HIGH (ref 3.5–5.1)
Sodium: 139 mmol/L (ref 135–145)
TCO2: 30 mmol/L (ref 22–32)

## 2021-04-16 SURGERY — ABDOMINAL AORTOGRAM W/LOWER EXTREMITY
Anesthesia: LOCAL | Laterality: Bilateral

## 2021-04-16 MED ORDER — HEPARIN (PORCINE) IN NACL 1000-0.9 UT/500ML-% IV SOLN
INTRAVENOUS | Status: DC | PRN
  Administered 2021-04-16 (×2): 500 mL

## 2021-04-16 MED ORDER — SODIUM CHLORIDE 0.9 % WEIGHT BASED INFUSION: 1.0000 mL/kg/h | INTRAVENOUS | Status: DC

## 2021-04-16 MED ORDER — LABETALOL HCL 5 MG/ML IV SOLN: 10.0000 mg | INTRAVENOUS | Status: DC | PRN

## 2021-04-16 MED ORDER — HEPARIN (PORCINE) IN NACL 1000-0.9 UT/500ML-% IV SOLN
INTRAVENOUS | Status: AC
  Filled 2021-04-16: qty 500

## 2021-04-16 MED ORDER — ONDANSETRON HCL 4 MG/2ML IJ SOLN: 4.0000 mg | Freq: Four times a day (QID) | INTRAMUSCULAR | Status: DC | PRN

## 2021-04-16 MED ORDER — OXYCODONE HCL 5 MG PO TABS: 5.0000 mg | ORAL_TABLET | ORAL | Status: DC | PRN

## 2021-04-16 MED ORDER — ACETAMINOPHEN 325 MG PO TABS: 650.0000 mg | ORAL_TABLET | ORAL | Status: DC | PRN

## 2021-04-16 MED ORDER — IODIXANOL 320 MG/ML IV SOLN
INTRAVENOUS | Status: DC | PRN
  Administered 2021-04-16: 97 mL

## 2021-04-16 MED ORDER — HYDRALAZINE HCL 20 MG/ML IJ SOLN: 5.0000 mg | INTRAMUSCULAR | Status: DC | PRN

## 2021-04-16 MED ORDER — LIDOCAINE HCL (PF) 1 % IJ SOLN
INTRAMUSCULAR | Status: DC | PRN
  Administered 2021-04-16: 12 mL

## 2021-04-16 MED ORDER — SODIUM CHLORIDE 0.9 % IV SOLN: INTRAVENOUS | Status: DC

## 2021-04-16 MED ORDER — SODIUM CHLORIDE 0.9% FLUSH: 3.0000 mL | Freq: Two times a day (BID) | INTRAVENOUS | Status: DC

## 2021-04-16 MED ORDER — SODIUM CHLORIDE 0.9% FLUSH: 3.0000 mL | INTRAVENOUS | Status: DC | PRN

## 2021-04-16 MED ORDER — LIDOCAINE HCL (PF) 1 % IJ SOLN
INTRAMUSCULAR | Status: AC
  Filled 2021-04-16: qty 30

## 2021-04-16 MED ORDER — SODIUM CHLORIDE 0.9 % IV SOLN: 250.0000 mL | INTRAVENOUS | Status: DC | PRN

## 2021-04-16 MED ORDER — MORPHINE SULFATE (PF) 2 MG/ML IV SOLN: 2.0000 mg | INTRAVENOUS | Status: DC | PRN

## 2021-04-16 SURGICAL SUPPLY — 10 items
CATH OMNI FLUSH 5F 65CM (CATHETERS) ×1 IMPLANT
CLOSURE MYNX CONTROL 5F (Vascular Products) ×1 IMPLANT
KIT MICROPUNCTURE NIT STIFF (SHEATH) ×1 IMPLANT
KIT PV (KITS) ×2 IMPLANT
SHEATH PINNACLE 5F 10CM (SHEATH) ×1 IMPLANT
SHEATH PROBE COVER 6X72 (BAG) ×1 IMPLANT
SYR MEDRAD MARK 7 150ML (SYRINGE) ×2 IMPLANT
TRANSDUCER W/STOPCOCK (MISCELLANEOUS) ×2 IMPLANT
TRAY PV CATH (CUSTOM PROCEDURE TRAY) ×2 IMPLANT
WIRE BENTSON .035X145CM (WIRE) ×1 IMPLANT

## 2021-04-16 NOTE — Op Note (Signed)
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 Donzetta Matters, MD Procedure Performed: 1.  Ultrasound-guided cannulation left common femoral artery 2.  Aortogram with bilateral extremity angiography 3.  Mynx device closure left common femoral artery  Indications: 66 year old male with history of venous reflux disease bilaterally.  There is no active ulceration with appears to be mixed venous and arterial.  There is no plan for angiography and will possibly need venous intervention in the future.  Findings: The aortoiliac segments are free of flow-limiting stenosis.  Otherwise without stenosis.  Minimal calcification distal aortic bifurcation. Hypogastric arteries are patent as are the common femoral arteries bilaterally.  The SFA on the right is diminutive in the profunda femoris artery is handling the bulk of the flow in the thigh.  The right SFA is subtotally occluded in the mid thigh for approximately 10 cm and reconstitutes the above-knee popliteal artery and there are 3 vessels leading to the right foot.  On the left side the SFA does have 2 areas of nonoccluding stenosis and again there is three-vessel runoff to the left foot.   Procedure:  The patient was identified in the holding area and taken to room 8.  The patient was then placed supine on the table and prepped and draped in the usual sterile fashion.  A time out was called.  Ultrasound was used to evaluate the left common femoral artery.  This was noted to be patent and compressible.  The area was anesthetized with 1% lidocaine and cannulated with micropuncture needle followed by wire and sheath.  An image was saved permanent record.  A 5 French sheath was placed followed by a Bentson wire and Omni catheter to the level of L1 and aortogram was performed.  Catheter was  retracted to the bifurcation and bilateral lower extremity runoff was performed.  With the above findings no intervention was undertaken.  Minx device was used to close the left common femoral artery.  He tolerated the procedure well without immediate complication.  Contrast: 93 cc    Bubba Vanbenschoten C. Donzetta Matters, MD [facility] [place] Office: [phone] Pager: [phone]

## 2021-04-16 NOTE — Progress Notes (Signed)
Patient and wife was given discharge instructions. Both verbalized understanding. 

## 2021-04-16 NOTE — Interval H&P Note (Signed)
History and Physical Interval Note:  04/16/2021 7:21 AM  [name]  has presented today for surgery, with the diagnosis of pvd.  The various methods of treatment have been discussed with the patient and family. After consideration of risks, benefits and other options for treatment, the patient has consented to  Procedure(s): ABDOMINAL AORTOGRAM W/LOWER EXTREMITY (N/A) as a surgical intervention.  The patient's history has been reviewed, patient examined, no change in status, stable for surgery.  I have reviewed the patient's chart and labs.  Questions were answered to the patient's satisfaction.     Servando Snare

## 2021-04-27 ENCOUNTER — Other Ambulatory Visit: Payer: Self-pay

## 2021-04-27 DIAGNOSIS — I70209 Unspecified atherosclerosis of native arteries of extremities, unspecified extremity: Secondary | ICD-10-CM

## 2021-05-08 ENCOUNTER — Other Ambulatory Visit: Payer: Self-pay

## 2021-05-08 ENCOUNTER — Other Ambulatory Visit: Payer: Self-pay | Admitting: Family Medicine

## 2021-05-21 ENCOUNTER — Telehealth: Payer: Self-pay

## 2021-05-21 NOTE — Telephone Encounter (Signed)
Received a call from patient's wife. Reports patient tested positive for Covid on 05/16/21 and he is still congested in chest. Advised recommended to postpone right fem-above knee popliteal bypass with surgery until after 10 days. Patient's wife rescheduled surgery for 06/08/21 to give patient more time to recover. She will contact the coumadin clinic to discuss Lovenox bridging instructions.

## 2021-05-22 ENCOUNTER — Other Ambulatory Visit (HOSPITAL_COMMUNITY): Payer: 59

## 2021-06-04 NOTE — Progress Notes (Signed)
Surgical Instructions ? ? ? Your procedure is scheduled on Friday, March 10th, 2023. ? ? Report to [facility] Main Entrance "A" at 05:30 A.M., then check in with the Admitting office. ? Call this number if you have problems the morning of surgery: ? [phone] ? ? If you have any questions prior to your surgery date call [phone]: Open Monday-Friday 8am-4pm ? ? ? Remember: ? Do not eat or drink after midnight the night before your surgery ? ?  ? Take these medicines the morning of surgery with A SIP OF WATER:  ? ?carvedilol (COREG)  ? ? ?Follow your surgeon's instructions on when to stop Aspirin and Coumadin/Lovenox.  If no instructions were given by your surgeon then you will need to call the office to get those instructions.    ? ?As of today, STOP taking any Aspirin (unless otherwise instructed by your surgeon) Aleve, Naproxen, Ibuprofen, Motrin, Advil, Goody's, BC's, all herbal medications, fish oil, and all vitamins. ? ? ?The day of surgery: ?         ?Do not wear jewelry  ?Do not wear lotions, powders, colognes, or deodorant. ?Men may shave face and neck. ?Do not bring valuables to the hospital. ? ? ?[facility] is not responsible for any belongings or valuables. .  ? ?Do NOT Smoke (Tobacco/Vaping)  24 hours prior to your procedure ? ?If you use a CPAP at night, you may bring your mask for your overnight stay. ?  ?Contacts, glasses, hearing aids, dentures or partials may not be worn into surgery, please bring cases for these belongings ?  ?For patients admitted to the hospital, discharge time will be determined by your treatment team. ?  ?Patients discharged the day of surgery will not be allowed to drive home, and someone needs to stay with them for 24 hours. ? ?NO VISITORS WILL BE ALLOWED IN PRE-OP WHERE PATIENTS ARE PREPPED FOR SURGERY.  ONLY 1 SUPPORT PERSON MAY BE PRESENT IN THE WAITING ROOM WHILE YOU ARE IN SURGERY.  IF YOU ARE TO BE ADMITTED, ONCE YOU ARE IN YOUR ROOM YOU WILL BE ALLOWED TWO (2)  VISITORS. 1 (ONE) VISITOR MAY STAY OVERNIGHT BUT MUST ARRIVE TO THE ROOM BY 8pm.  Minor children may have two parents present. Special consideration for safety and communication needs will be reviewed on a case by case basis. ? ?Special instructions:   ? ?Oral Hygiene is also important to reduce your risk of infection.  Remember - BRUSH YOUR TEETH THE MORNING OF SURGERY WITH YOUR REGULAR TOOTHPASTE ? ? ?[facility]- Preparing For Surgery ? ?Before surgery, you can play an important role. Because skin is not sterile, your skin needs to be as free of germs as possible. You can reduce the number of germs on your skin by washing with CHG (chlorahexidine gluconate) Soap before surgery.  CHG is an antiseptic cleaner which kills germs and bonds with the skin to continue killing germs even after washing.   ? ? ?Please do not use if you have an allergy to CHG or antibacterial soaps. If your skin becomes reddened/irritated stop using the CHG.  ?Do not shave (including legs and underarms) for at least 48 hours prior to first CHG shower. It is OK to shave your face. ? ?Please follow these instructions carefully. ?  ? ? Shower the NIGHT BEFORE SURGERY and the MORNING OF SURGERY with CHG Soap.  ? If you chose to wash your hair, wash your hair first as usual with your  normal shampoo. After you shampoo, rinse your hair and body thoroughly to remove the shampoo.  Then [employer] and genitals (private parts) with your normal soap and rinse thoroughly to remove soap. ? ?After that Use CHG Soap as you would any other liquid soap. You can apply CHG directly to the skin and wash gently with a scrungie or a clean washcloth.  ? ?Apply the CHG Soap to your body ONLY FROM THE NECK DOWN.  Do not use on open wounds or open sores. Avoid contact with your eyes, ears, mouth and genitals (private parts). Wash Face and genitals (private parts)  with your normal soap.  ? ?Wash thoroughly, paying special attention to the area where your surgery will  be performed. ? ?Thoroughly rinse your body with warm water from the neck down. ? ?DO NOT shower/wash with your normal soap after using and rinsing off the CHG Soap. ? ?Pat yourself dry with a CLEAN TOWEL. ? ?Wear CLEAN PAJAMAS to bed the night before surgery ? ?Place CLEAN SHEETS on your bed the night before your surgery ? ?DO NOT SLEEP WITH PETS. ? ? ?Day of Surgery: ? ?Take a shower with CHG soap. ?Wear Clean/Comfortable clothing the morning of surgery ?Do not apply any deodorants/lotions.   ?Remember to brush your teeth WITH YOUR REGULAR TOOTHPASTE. ? ?  ?Please read over the following fact sheets that you were given.   ?

## 2021-06-05 ENCOUNTER — Other Ambulatory Visit: Payer: Self-pay

## 2021-06-05 ENCOUNTER — Encounter (HOSPITAL_COMMUNITY): Payer: Self-pay | Admitting: Vascular Surgery

## 2021-06-05 ENCOUNTER — Encounter (HOSPITAL_COMMUNITY): Payer: Self-pay

## 2021-06-05 ENCOUNTER — Encounter (HOSPITAL_COMMUNITY)
Admission: RE | Admit: 2021-06-05 | Discharge: 2021-06-05 | Disposition: A | Discharge status: Home / Self Care | Payer: Medicare Other | Source: Ambulatory Visit | Attending: Vascular Surgery | Admitting: Vascular Surgery

## 2021-06-05 VITALS — BP 121/75 | HR 120 | Temp 97.8°F | Resp 19 | Ht 69.0 in | Wt 200.9 lb

## 2021-06-05 DIAGNOSIS — Z01818 Encounter for other preprocedural examination: Secondary | ICD-10-CM | POA: Diagnosis not present

## 2021-06-05 DIAGNOSIS — I70209 Unspecified atherosclerosis of native arteries of extremities, unspecified extremity: Secondary | ICD-10-CM | POA: Diagnosis not present

## 2021-06-05 DIAGNOSIS — I4892 Unspecified atrial flutter: Secondary | ICD-10-CM | POA: Insufficient documentation

## 2021-06-05 DIAGNOSIS — Z7901 Long term (current) use of anticoagulants: Secondary | ICD-10-CM | POA: Diagnosis not present

## 2021-06-05 HISTORY — DX: Cardiac arrhythmia, unspecified: I49.9

## 2021-06-05 HISTORY — DX: Cerebral infarction, unspecified: I63.9

## 2021-06-05 HISTORY — DX: Dyspnea, unspecified: R06.00

## 2021-06-05 HISTORY — DX: Malignant (primary) neoplasm, unspecified: C80.1

## 2021-06-05 HISTORY — DX: Pneumonia, unspecified organism: J18.9

## 2021-06-05 HISTORY — DX: Angina pectoris, unspecified: I20.9

## 2021-06-05 HISTORY — DX: Acute myocardial infarction, unspecified: I21.9

## 2021-06-05 LAB — COMPREHENSIVE METABOLIC PANEL
ALT: 20 U/L (ref 0–44)
AST: 25 U/L (ref 15–41)
Albumin: 3.8 g/dL (ref 3.5–5.0)
Alkaline Phosphatase: 104 U/L (ref 38–126)
Anion gap: 5 (ref 5–15)
BUN: 17 mg/dL (ref 8–23)
CO2: 33 mmol/L — ABNORMAL HIGH (ref 22–32)
Calcium: 9.2 mg/dL (ref 8.9–10.3)
Chloride: 103 mmol/L (ref 98–111)
Creatinine, Ser: 0.96 mg/dL (ref 0.61–1.24)
GFR, Estimated: >60 mL/min (ref >60)
Glucose, Bld: 117 mg/dL — ABNORMAL HIGH (ref 70–99)
Potassium: 3.8 mmol/L (ref 3.5–5.1)
Sodium: 141 mmol/L (ref 135–145)
Total Bilirubin: 0.7 mg/dL (ref 0.3–1.2)
Total Protein: 6.9 g/dL (ref 6.5–8.1)

## 2021-06-05 LAB — URINALYSIS, ROUTINE W REFLEX MICROSCOPIC
Bilirubin Urine: NEGATIVE
Glucose, UA: 50 mg/dL — AB
Ketones, ur: NEGATIVE mg/dL
Leukocytes,Ua: NEGATIVE
Nitrite: NEGATIVE
Protein, ur: NEGATIVE mg/dL
Specific Gravity, Urine: 1.021 (ref 1.005–1.030)
pH: 5 (ref 5.0–8.0)

## 2021-06-05 LAB — APTT: aPTT: 79 seconds — ABNORMAL HIGH (ref 24–36)

## 2021-06-05 LAB — CBC
HCT: 43.8 % (ref 39.0–52.0)
Hemoglobin: 13.8 g/dL (ref 13.0–17.0)
MCH: 29.8 pg (ref 26.0–34.0)
MCHC: 31.5 g/dL (ref 30.0–36.0)
MCV: 94.6 fL (ref 80.0–100.0)
Platelets: 157 10*3/uL (ref 150–400)
RBC: 4.63 MIL/uL (ref 4.22–5.81)
RDW: 15.5 % (ref 11.5–15.5)
WBC: 6.9 10*3/uL (ref 4.0–10.5)
nRBC: 0 % (ref 0.0–0.2)

## 2021-06-05 LAB — SURGICAL PCR SCREEN
MRSA, PCR: NEGATIVE
Staphylococcus aureus: NEGATIVE

## 2021-06-05 LAB — PROTIME-INR
INR: 2.6 — ABNORMAL HIGH (ref 0.8–1.2)
Prothrombin Time: 28.1 seconds — ABNORMAL HIGH (ref 11.4–15.2)

## 2021-06-05 LAB — TYPE AND SCREEN
ABO/RH(D): O POS
Antibody Screen: NEGATIVE

## 2021-06-05 NOTE — Progress Notes (Addendum)
Anesthesia PAT Evaluation:  Case: [id number] Date/Time: 06/08/21 0715   Procedure: RIGHT COMMON FEMORAL-ABOVE KNEE POPLITEAL ARTERY BYPASS WITH VEIN (Right)   Anesthesia type: General   Pre-op diagnosis: Superficial femoral artery occlusion   Location: [place] OR ROOM 12 / [facility]   Surgeons: Waynetta Sandy, MD       DISCUSSION: Patient is a 66 year old male scheduled for the above procedure. RLE wound with mixed LE venous and arterial disease.  History includes smoking (~ 5 cig/day), COPD, HTN, CAD (inferior STEMI, s/p DES dRCA 11/24/2017), rheumatic valvular disease (s/p valvuloplasty ~ 1998; s/p LAA ligation/MAZE, mechanical AVR/MVR 2013 in [place]), PAF, CVA (~ 2012/2013), PVD, dyspnea, skin cancer, appendectomy (05/17/19).     He reported + home COVID-19 test on 05/16/21 with respiratory symptoms and surgeon advised to postpone surgery for at least 10 days. Surgery now rescheduled for 06/08/21. He says that even at baseline he has a chronic intermittent cough. Prior to [place] he had DOE with more strenuous activity such as mountain biking. He doesn't feel that his breathing is quite back to baseline, but doesn't really feel more SOB currently, but feels like he has fairly chronic chest heaviness which he describes as feeling like he needs to take a deep breath. He denied any chest symptoms similar to when he had his MI, and says symptoms now are actually better when he is up and moving--he notices the need to take a deep breath more when he is just sitting or when he is lying down at night. He reports chronic BLE edema that waxes and wanes to a degree and does not seem worse then usual. His RR is normal, O2 sats are 98-100%. He does have slight audible wheeze, but does not appear to have any increased WOB or in acute distress. He has an albuterol inhaler for "COPD", but does not use it. He smokes when he is mad, stressed or anxious--typically about 5 cigarettes/day.  Last cardiology visit with Dr. Duke Salvia  was on 03/20/21 and summarized below. Last EKG narrative through [place] seen was on 11/22/17 and  read as SR with occasional PVCs, inferior infarct (age undetermined). EKG at [facility] on 03/15/19 showed SR. Mr. Rosko wass unaware of any recurrent atrial arrhthymias since his 2013 MAZE/valve replacement surgery. His 04/16/21 EKG done by vascular surgery prior to arteriogram was interpreted by reading cardiologist as atrial flutter with variable AV block with premature ventricular or aberrantly conducted complexes, rightward axis. 06/05/21 EKG done due to persistent tachycardia and to re-evaluate rhythm since he was unaware of recurrent atrial rhythm since 2013. EKG likely showed ectopic atrial tachycardia versus aflutter with RVR at 121 bpm. The EKG has not yet been confirmed by the reading cardiologist.   Given some upper respiratory wheezing and tachycardia with question of new/recurrent atrial tachycardia or aflutter, advised that he be re-evaluated prior to vascular surgery. He does not report symptoms similar to his previous MI and denied exertional chest pain. He denied change in LE edema. O2 sats and RR normal. He has been on anticoagulation as well and has a therapeutic INR at 2.6 today, so I think PE would be less likely. I contacted Dr. Glennis Brink office and Christa See, NP is able to see Mr. Loren at 2:30 PM 06/05/21.  He is aware of appointment at the [facility] location.  If any acute symptoms in the meantime then advised to go to ED. I will fax copies of last two EKG to his cardiology provider for  review. Anesthesiologist Timoteo Gaul, MD agrees with plan.   Discussed with Mr. Wigfall that his heart rate should be better controlled prior to surgery and respiratory status back to baseline. We will see what recommendations cardiology has. I will update Dr. Donzetta Matters and leave chart for follow-up.   Lovenex bridge instructions are outlined in Care Everywhere per [facility].  He reported last dose warfarin was 06/03/21 with plans to start Lovenox bridge on 06/05/21. PT/INR 28.1/2.6 and PTT 79 on 06/05/21.   ADDENDUM 06/05/21 4:19 PM: 06/05/21 office note is not yet viewable, but Patient Instructions: "Aflutter with RVR Will need to get you back into a normal heart rhythm Plan for cardioversion in ~one week Repeat echocardiogram in one month Post-pone surgery until we can fix your heart rhythm  Resume Warfarin  Follow up in 2 months".   VS: BP 132/77    Pulse (!) 115    Temp 36.6 C    Resp 17    Ht '5\' 9"'$  (1.753 m)    Wt 91.1 kg    SpO2 100%    BMI 29.67 kg/m  Recheck HR 120 bpm, BP 121/75, O2 sat 98%. Patient and provider wore face masks during evaluation. A&O x4. Patient ambulating independently. Wife is in waiting room. Mild audible wheezing noted with talking, but no signs of acute distress or increased work of breathing noted. Lung sounds were actually clear on my exam. Heart rhythm regular but rate ~ 120 bpm. RLE with ace wrap dressing. BLE + edema with venous dermatitis changes which he reported as chronic.    PROVIDERS: [name], MD is PCP Elby Beck, MD is cardiologist ([facility]). Last cardiology visit with Dr. Duke Salvia was on 03/20/21. Notes indicated he was doing well.  He denied chest pain, shortness of breath, orthopnea, and PND.  No palpitations, near-syncope or syncope.  He remained "active".  INR goal 2.5-3.5.  Echo in 05/2019 showed both mechanical valves well-seated with LVEF 60 to 65%.  1 year follow-up planned with echocardiogram. He is on warfarin for PAF and mechanical heart valves. As above, he is an add-on for Maia Plan, FNP on 06/05/21.    LABS: Labs reviewed: Repeat PT/PTT day of surgery.  (all labs ordered are listed, but only abnormal results are displayed)  Labs Reviewed  COMPREHENSIVE METABOLIC PANEL - Abnormal; Notable for the following components:      Result Value   CO2 33 (*)    Glucose, Bld 117 (*)    All other components  within normal limits  PROTIME-INR - Abnormal; Notable for the following components:   Prothrombin Time 28.1 (*)    INR 2.6 (*)    All other components within normal limits  APTT - Abnormal; Notable for the following components:   aPTT 79 (*)    All other components within normal limits  URINALYSIS, ROUTINE W REFLEX MICROSCOPIC - Abnormal; Notable for the following components:   Glucose, UA 50 (*)    Hgb urine dipstick SMALL (*)    Bacteria, UA RARE (*)    All other components within normal limits  SURGICAL PCR SCREEN  CBC  TYPE AND SCREEN    EKG: EKG 06/05/21: Unusual P axis, possible ectopic atrial tachycardia Rightward axis Anterior infarct , age undetermined - Consider atrial tachycardia versus atrial flutter with RVR at 121 bpm. Tracing forwarded to Mr. [place] Bright cardiology provider and has visit 06/05/21 at 2:30 PM.  EKG 04/16/21: Ventricular rate 108 bpm Atrial flutter with variable A-V block  with premature ventricular or aberrantly conducted complexes Rightward axis Compared to previous EKG [03/15/19 in [place]] atrial flutter is new and rate is faster Confirmed by Dorris Carnes [phone]) on 04/16/2021 4:00:17 PM  EKG 03/15/19: SR  EKG 11/22/17 ([facility]): Per Narrative: - Sinus rhythm with occasional Premature ventricular complexes  - Inferior infarct , age undetermined  - When compared with ECG of 21-Nov-2017 15:38, Premature ventricular complexes are now present  - T wave inversion now evident in inferior leads  Confirmed by MILLER, DR. H. S. ([id number]) on 11/22/2017 1:05:16 PM    CV: Aortogram with BLE angiography 04/16/21: Findings: The aortoiliac segments are free of flow-limiting stenosis.  Otherwise without stenosis.  Minimal calcification distal aortic bifurcation. Hypogastric arteries are patent as are the common femoral arteries bilaterally.  The SFA on the right is diminutive in the profunda femoris artery is handling the bulk of the flow in the thigh.  The right SFA is  subtotally occluded in the mid thigh for approximately 10 cm and reconstitutes the above-knee popliteal artery and there are 3 vessels leading to the right foot.  On the left side the SFA does have 2 areas of nonoccluding stenosis and again there is three-vessel runoff to the left foot.   Echo 05/06/19 ([facility]): SUMMARY  The left ventricular size is normal.  There is normal left ventricular wall thickness.  Left ventricular systolic function is normal.  LV ejection fraction = 60-65%.  The right ventricle is normal in size and function.  The left atrium is mildly dilated.  There is a mechanical aortic valve.  The prosthetic aortic valve is well-seated with normal function  There is a mechanical mitral valve.  The prosthetic mitral valve is well-seated with normal function.  The IVC is normal in size with an inspiratory collapse of greater then  50%, suggesting normal right atrial pressure.  There is no pericardial effusion.   Compared to the last study dated 10/2017 showing improvement of LV  function and resolution of wall motion abnormalities. This likely  explains mild increase in gradient across AV.    Cardiac cath 11/24/17 ([facility]):  Single-vessel coronary artery disease.   LCP for inferior STEMI.  Has mechanical aortic and mitral valves but no coronary revascularization.   Right radial access.  Left side with only mild non obstructive disease.  100% distal RCA lesion, thrombotic appearance  Stented with 3.0x16 mm Synergy DES, post dilated with 3.25 [place].  Good angiographic result.  No IIBIIIA inhibitor because therapeutic INR.    Past Medical History:  Diagnosis Date   Anginal pain ([place])    Cancer ([place])    skin   COPD (chronic obstructive pulmonary disease) (HCC)    Coronary artery disease    Dyspnea    Dysrhythmia    Hx of artificial heart valve replacement    Hypertension    Mitral insufficiency and aortic stenosis    Myocardial infarction ([place])    Pneumonia     PVD (peripheral vascular disease) ([place])    Rheumatic heart disease    Stroke [facility])     Past Surgical History:  Procedure Laterality Date   ABDOMINAL AORTOGRAM W/LOWER EXTREMITY Bilateral 04/16/2021   Procedure: ABDOMINAL AORTOGRAM W/LOWER EXTREMITY;  Surgeon: Waynetta Sandy, MD;  Location: [facility];  Service: Cardiovascular;  Laterality: Bilateral;   AORTIC AND MITRAL VALVE REPLACEMENT  2013   [facility]?   APPENDECTOMY     BIOPSY  04/20/2020   Procedure: BIOPSY;  Surgeon: Therisa Doyne,  Megan Salon, MD;  Location: [facility];  Service: Gastroenterology;;   CARDIAC VALVE REPLACEMENT     COLONOSCOPY WITH PROPOFOL N/A 04/20/2020   Procedure: COLONOSCOPY WITH PROPOFOL;  Surgeon: [name], MD;  Location: [facility];  Service: Gastroenterology;  Laterality: N/A;   CORONARY ANGIOPLASTY WITH STENT PLACEMENT  10/2017   LAPAROSCOPIC APPENDECTOMY N/A 05/17/2019   Procedure: INTERVAL LAPAROSCOPIC APPENDECTOMY;  Surgeon: Greer Pickerel, MD;  Location: [facility];  Service: General;  Laterality: N/A;   MAZE  2013   atrial appendage ligation and maze procedure in 2013   POLYPECTOMY  04/20/2020   Procedure: POLYPECTOMY;  Surgeon: [name], MD;  Location: [facility];  Service: Gastroenterology;;    MEDICATIONS:  aspirin EC 81 MG tablet   atorvastatin (LIPITOR) 80 MG tablet   carvedilol (COREG) 3.125 MG tablet   enalapril (VASOTEC) 5 MG tablet   enoxaparin (LOVENOX) 100 MG/ML injection   silver sulfADIAZINE (SILVADENE) 1 % cream   warfarin (COUMADIN) 1 MG tablet   warfarin (COUMADIN) 6 MG tablet   No current facility-administered medications for this encounter.    Myra Gianotti, PA-C Surgical Short Stay/Anesthesiology [facility] Phone [phone] [facility] Phone [phone] 06/05/2021 12:21 PM

## 2021-06-05 NOTE — Progress Notes (Signed)
PCP - Jenna Luo, MD ?Cardiologist - Doristine Bosworth, MD ? ?PPM/ICD - denies ?Device Orders - n/a ?Rep Notified - n/a ? ?Chest x-ray - n/a ?EKG - 06/05/2021 ?Stress Test - > 5 years ?ECHO - 05/06/2019 ?Cardiac Cath - 11/24/2017 ? ?Sleep Study - denies ?CPAP - n/a ? ?Fasting Blood Sugar - n/a ? ? ?Blood Thinner Instructions: Coumadin - last dose - 06/03/2021 per patient; Lovenox - patient verbalized that he will start today, MD instruction ? ?Aspirin Instructions: Patient verbalized that he will continue taking Aspirin even the day of surgery.  ? ?Patient was instructed: As of today, STOP taking any Aspirin (unless otherwise instructed by your surgeon) Aleve, Naproxen, Ibuprofen, Motrin, Advil, Goody's, BC's, all herbal medications, fish oil, and all vitamins. ? ?ERAS Protcol - n/a  ? ?COVID TEST- patient was tested positive on 05/16/2021. ? ? ?Anesthesia review: Patient was saw by Toni Arthurs in PAT. Pulse elevated in PAT 115 - 120. Patient with wheezing, verbalized that this is the only symptom that he has since he was tested positive for COVID.  ? ?Patient denies shortness of breath, fever, cough and chest pain at PAT appointment ? ? ?All instructions explained to the patient, with a verbal understanding of the material. Patient agrees to go over the instructions while at home for a better understanding. Patient also instructed to self quarantine after being tested for COVID-19. The opportunity to ask questions was provided. ?  ?

## 2021-06-05 NOTE — Progress Notes (Signed)
Abnormal labs in PAT; Urinalysis (Glucose 50; Bacteria - rare; Hgb urine dipstick - small), APTT 79; PT 28.1; INR 2.6. Dr. Donzetta Matters office was notified. ?

## 2021-06-06 ENCOUNTER — Telehealth: Payer: Self-pay | Admitting: Family Medicine

## 2021-06-06 NOTE — Telephone Encounter (Signed)
Patient's wife Gae Bon called to request refill of ? ?silver sulfADIAZINE (SILVADENE) 1 % cream [[id number]]  ? ?Patient has been out of cream for a few weeks. Stated refill request denied. ? ?Pharmacy confirmed as ? ?[facility]  ?[place], [place] [address]  ?Phone:  [phone]  Fax:  [phone]  ?DEA #:  [id number] ? ?Please advise at [phone]. ?

## 2021-06-06 NOTE — Telephone Encounter (Signed)
Please call and schedule CPE - once scheduled we can send in refill. Thanks! ?

## 2021-06-06 NOTE — Telephone Encounter (Signed)
Patient was treated for VS ulcer RLE 03/08/21. Given silvadene cream at that time. Does patient need follow up appt to recheck ulcer? ? ?Please advise, thanks! ?

## 2021-06-07 MED ORDER — SILVER SULFADIAZINE 1 % EX CREA: 1.0000 "application " | TOPICAL_CREAM | Freq: Every day | CUTANEOUS | 0 refills | Status: DC

## 2021-06-07 NOTE — Telephone Encounter (Signed)
Refill sent to pharmacy.   

## 2021-06-07 NOTE — Telephone Encounter (Signed)
Called pt and scheduled cpe for 3/23. Pt is still wanting to know if now he can get the prescription filled until then. Please advise. ? ?Cb# [phone] ?

## 2021-06-08 ENCOUNTER — Inpatient Hospital Stay (HOSPITAL_COMMUNITY): Admission: RE | Admit: 2021-06-08 | Payer: Medicare Other | Source: Home / Self Care | Admitting: Vascular Surgery

## 2021-06-08 SURGERY — BYPASS GRAFT FEMORAL-POPLITEAL ARTERY
Anesthesia: General | Laterality: Right

## 2021-06-21 ENCOUNTER — Ambulatory Visit (INDEPENDENT_AMBULATORY_CARE_PROVIDER_SITE_OTHER): Payer: Medicare Other | Admitting: Family Medicine

## 2021-06-21 ENCOUNTER — Other Ambulatory Visit: Payer: Self-pay

## 2021-06-21 ENCOUNTER — Encounter: Payer: Self-pay | Admitting: Family Medicine

## 2021-06-21 VITALS — BP 128/62 | HR 79 | Temp 97.5°F | Ht 69.0 in | Wt 203.4 lb

## 2021-06-21 DIAGNOSIS — Z0001 Encounter for general adult medical examination with abnormal findings: Secondary | ICD-10-CM

## 2021-06-21 DIAGNOSIS — Z23 Encounter for immunization: Secondary | ICD-10-CM | POA: Diagnosis not present

## 2021-06-21 DIAGNOSIS — I739 Peripheral vascular disease, unspecified: Secondary | ICD-10-CM

## 2021-06-21 DIAGNOSIS — Z122 Encounter for screening for malignant neoplasm of respiratory organs: Secondary | ICD-10-CM | POA: Diagnosis not present

## 2021-06-21 DIAGNOSIS — Z720 Tobacco use: Secondary | ICD-10-CM

## 2021-06-21 DIAGNOSIS — Z0000 Encounter for general adult medical examination without abnormal findings: Secondary | ICD-10-CM

## 2021-06-21 DIAGNOSIS — E7800 Pure hypercholesterolemia, unspecified: Secondary | ICD-10-CM

## 2021-06-21 DIAGNOSIS — Z952 Presence of prosthetic heart valve: Secondary | ICD-10-CM

## 2021-06-21 DIAGNOSIS — I480 Paroxysmal atrial fibrillation: Secondary | ICD-10-CM

## 2021-06-21 DIAGNOSIS — Z125 Encounter for screening for malignant neoplasm of prostate: Secondary | ICD-10-CM

## 2021-06-21 NOTE — Addendum Note (Signed)
Addended by: Colman Cater on: 06/21/2021 04:47 PM ? ? Modules accepted: Orders ? ?

## 2021-06-21 NOTE — Progress Notes (Signed)
? ?Subjective:  ? ? Patient ID: [name], male    DOB: 08/14/1955, 66 y.o.   MRN: [id number] ? ?HPI ?Patient is a very pleasant 66 year old Caucasian gentleman here today for complete physical exam.  His last colonoscopy was in 2022.  At that time he had 7 polyps.  He is unsure when they wanted to do his next colonoscopy.  He is due for prostate cancer screening.  He is overdue for lung cancer screening.  He has chronic venous insufficiency and is being scheduled for venous ablation in his right lower leg due to nonhealing venous stasis ulcers.  He also has peripheral vascular disease and they are scheduling him for a bypass in his right leg.  All this has been put on hold due to [place] and recently he underwent atrial flutter and had to undergo cardioversion.  Unfortunately he continues to smoke.  He is due for Prevnar 20, the shingles vaccine, and COVID booster. ? ?Past Medical History:  ?Diagnosis Date  ? Anginal pain ([place])   ? Cancer [facility])   ? skin  ? COPD (chronic obstructive pulmonary disease) ([place])   ? Coronary artery disease   ? Dyspnea   ? Dysrhythmia   ? Hx of artificial heart valve replacement   ? Hypertension   ? Mitral insufficiency and aortic stenosis   ? Myocardial infarction [facility])   ? Pneumonia   ? PVD (peripheral vascular disease) ([place])   ? Rheumatic heart disease   ? Stroke [facility])   ? ?Past Surgical History:  ?Procedure Laterality Date  ? ABDOMINAL AORTOGRAM W/LOWER EXTREMITY Bilateral 04/16/2021  ? Procedure: ABDOMINAL AORTOGRAM W/LOWER EXTREMITY;  Surgeon: Waynetta Sandy, MD;  Location: [facility] [facility];  Service: Cardiovascular;  Laterality: Bilateral;  ? AORTIC AND MITRAL VALVE REPLACEMENT  2013  ? [facility]?  ? APPENDECTOMY    ? BIOPSY  04/20/2020  ? Procedure: BIOPSY;  Surgeon: [name], MD;  Location: [facility];  Service: Gastroenterology;;  ? CARDIAC VALVE REPLACEMENT    ? COLONOSCOPY WITH PROPOFOL N/A 04/20/2020  ? Procedure: COLONOSCOPY WITH PROPOFOL;  Surgeon: [name],  MD;  [facility];  Service: Gastroenterology;  Laterality: N/A;  ? CORONARY ANGIOPLASTY WITH STENT PLACEMENT  10/2017  ? LAPAROSCOPIC APPENDECTOMY N/A 05/17/2019  ? Procedure: INTERVAL LAPAROSCOPIC APPENDECTOMY;  Surgeon: Greer Pickerel, MD;  [facility];  Service: General;  Laterality: N/A;  ? MAZE  2013  ? atrial appendage ligation and maze procedure in 2013  ? POLYPECTOMY  04/20/2020  ? Procedure: POLYPECTOMY;  Surgeon: [name], MD;  Location: [facility];  Service: Gastroenterology;;  ? ?Current Outpatient Medications on File Prior to Visit  ?Medication Sig Dispense Refill  ? aspirin EC 81 MG tablet Take 81 mg by mouth daily. Swallow whole.    ? atorvastatin (LIPITOR) 80 MG tablet Take 80 mg by mouth every evening.     ? carvedilol (COREG) 3.125 MG tablet Take 3.125 mg by mouth 2 (two) times daily with a meal.    ? enalapril (VASOTEC) 5 MG tablet Take 5 mg by mouth daily.    ? silver sulfADIAZINE (SILVADENE) 1 % cream Apply 1 application. topically daily. 50 g 0  ? warfarin (COUMADIN) 1 MG tablet Take 1 mg by mouth See admin instructions. Take 1 mg with the 6 mg tablet to equal 7 mg daily except Friday skip 1 mg dose    ? warfarin (COUMADIN) 6 MG tablet Take 6 mg by mouth daily.    ?  enoxaparin (LOVENOX) 100 MG/ML injection Inject 90 mg into the skin every 12 (twelve) hours. (Patient not taking: Reported on 06/21/2021)    ? ?No current facility-administered medications on file prior to visit.  ? ?No Known Allergies ?Social History  ? ?Socioeconomic History  ? Marital status: Single  ?  Spouse name: Not on file  ? Number of children: Not on file  ? Years of education: Not on file  ? Highest education level: Not on file  ?Occupational History  ? Not on file  ?Tobacco Use  ? Smoking status: Some Days  ?  Types: Cigarettes  ? Smokeless tobacco: Never  ?Vaping Use  ? Vaping Use: Never used  ?Substance and Sexual Activity  ? Alcohol use: Yes  ?  Comment: ocassional  ? Drug use: Never  ? Sexual  activity: Not on file  ?Other Topics Concern  ? Not on file  ?Social History Narrative  ? Not on file  ? ?Social Determinants of Health  ? ?Financial Resource Strain: Not on file  ?Food Insecurity: Not on file  ?Transportation Needs: Not on file  ?Physical Activity: Not on file  ?Stress: Not on file  ?Social Connections: Not on file  ?Intimate Partner Violence: Not on file  ? ? ?Review of Systems  ?All other systems reviewed and are negative. ? ?   ?Objective:  ? Physical Exam ?Constitutional:   ?   General: He is not in acute distress. ?   Appearance: Normal appearance. He is normal weight. He is not ill-appearing or toxic-appearing.  ?Cardiovascular:  ?   Rate and Rhythm: Normal rate and regular rhythm.  ?   Heart sounds: Murmur heard.  ?Pulmonary:  ?   Effort: Pulmonary effort is normal. No respiratory distress.  ?   Breath sounds: Normal breath sounds. No stridor. No wheezing, rhonchi or rales.  ?Abdominal:  ?   General: Abdomen is flat. Bowel sounds are normal. There is no distension.  ?   Palpations: Abdomen is soft.  ?   Tenderness: There is no abdominal tenderness. There is no guarding.  ?Musculoskeletal:  ?   Right lower leg: Deformity present. Pitting Edema present.  ?   Left lower leg: Pitting Edema present.  ?     Legs: ? ?Neurological:  ?   Mental Status: He is alert.  ? ? ?Patient has palpable dorsalis pedis pulses bilaterally and less than 3-second capillary refill in his toes.  There is no evidence of gangrene. ? ? ?   ?Assessment & Plan:  ?Screening for lung cancer - Plan: CT CHEST LUNG CA SCREEN LOW DOSE W/O CM ? ?General medical exam ? ?PVD (peripheral vascular disease) ([place]) - Plan: Lipid panel ? ?Pure hypercholesterolemia - Plan: Lipid panel ? ?H/O mitral valve replacement with mechanical valve ? ?Paroxysmal atrial fibrillation (HCC) ? ?Tobacco abuse ? ?Prostate cancer screening - Plan: PSA in the management of his peripheral vascular disease and chronic venous insufficiency to his vascular  surgeon.  Continue to strongly recommend smoking cessation.  Patient states that he is working long.  He received Prevnar 20 today.  I recommended Shingrix as well as a COVID booster at his local pharmacy.  I will schedule the patient for lung cancer screening with a CT scan.  I will screen for prostate cancer with a PSA.  Colonoscopy was performed in 2022.  I will contact his gastroenterologist to determine when he is next due.  I suspect 3 years based on the number of polyps. ? ?

## 2021-06-22 ENCOUNTER — Other Ambulatory Visit: Payer: Self-pay

## 2021-06-22 DIAGNOSIS — R972 Elevated prostate specific antigen [PSA]: Secondary | ICD-10-CM

## 2021-06-22 LAB — PSA: PSA: 3.97 ng/mL (ref <=4.00)

## 2021-06-22 LAB — LIPID PANEL
Cholesterol: 118 mg/dL (ref <200)
HDL: 39 mg/dL — ABNORMAL LOW (ref >=40)
LDL Cholesterol (Calc): 61 mg/dL (calc)
Non-HDL Cholesterol (Calc): 79 mg/dL (calc) (ref <130)
Total CHOL/HDL Ratio: 3 (calc) (ref <5.0)
Triglycerides: 95 mg/dL (ref <150)

## 2021-07-09 ENCOUNTER — Telehealth: Payer: Self-pay | Admitting: Family Medicine

## 2021-07-09 NOTE — Telephone Encounter (Signed)
Received call from [place] with [facility] to request prior auth for patient's secondary insruance; states CT for lung cancer screening is needed by 12 noon tomorrow for upcoming appointment. ? ?Please advise at [phone] with questions.  ?

## 2021-07-11 ENCOUNTER — Ambulatory Visit
Admission: RE | Admit: 2021-07-11 | Discharge: 2021-07-11 | Disposition: A | Discharge status: Home / Self Care | Payer: Medicare Other | Source: Ambulatory Visit | Attending: Family Medicine | Admitting: Family Medicine

## 2021-07-11 DIAGNOSIS — Z122 Encounter for screening for malignant neoplasm of respiratory organs: Secondary | ICD-10-CM

## 2021-09-04 NOTE — Progress Notes (Signed)
Surgical Instructions    Your procedure is scheduled on Friday, June 16th.  Report to [facility] Main Entrance "A" at 5:30 A.M., then check in with the Admitting office.  Call this number if you have problems the morning of surgery:  [phone]   If you have any questions prior to your surgery date call [phone]: Open Monday-Friday 8am-4pm    Remember:  Do not eat after midnight the night before your surgery  You may drink clear liquids until 4:30 AM the morning of your surgery.   Clear liquids allowed are: Water, Non-Citrus Juices (without pulp), Carbonated Beverages, Clear Tea, Black Coffee ONLY (NO MILK, CREAM OR POWDERED CREAMER of any kind), and Gatorade    Take these medicines the morning of surgery with A SIP OF WATER:   Carvedilol (Coreg)  Follow your surgeon's instructions on when to stop Coumadin & Aspirin.  If no instructions were given by your surgeon then you will need to call the office to get those instructions.    As of today, STOP taking any Aleve, Naproxen, Ibuprofen, Motrin, Advil, Goody's, BC's, all herbal medications, fish oil, and all vitamins.           DAY OF SURGERY: Do not wear jewelry  Do not wear lotions, powders, perfumes, or deodorant. Men may shave face and neck. Do not bring valuables to the hospital.  [facility] is not responsible for any belongings or valuables. .   Do NOT Smoke (Tobacco/Vaping)  24 hours prior to your procedure  If you use a CPAP at night, you may bring your mask for your overnight stay.   Contacts, glasses, hearing aids, dentures or partials may not be worn into surgery, please bring cases for these belongings   For patients admitted to the hospital, discharge time will be determined by your treatment team.   Patients discharged the day of surgery will not be allowed to drive home, and someone needs to stay with them for 24 hours.   SURGICAL WAITING ROOM VISITATION Patients having surgery or a procedure in a  hospital may have two support people. Children under the age of 67 must have an adult with them who is not the patient. They may stay in the waiting area during the procedure and may switch out with other visitors. If the patient needs to stay at the hospital during part of their recovery, the visitor guidelines for inpatient rooms apply.  Please refer to the [facility] website for the visitor guidelines for Inpatients (after your surgery is over and you are in a regular room).    Special instructions:    Oral Hygiene is also important to reduce your risk of infection.  Remember - BRUSH YOUR TEETH THE MORNING OF SURGERY WITH YOUR REGULAR TOOTHPASTE   [place]- Preparing For Surgery  Before surgery, you can play an important role. Because skin is not sterile, your skin needs to be as free of germs as possible. You can reduce the number of germs on your skin by washing with CHG (chlorahexidine gluconate) Soap before surgery.  CHG is an antiseptic cleaner which kills germs and bonds with the skin to continue killing germs even after washing.     Please do not use if you have an allergy to CHG or antibacterial soaps. If your skin becomes reddened/irritated stop using the CHG.  Do not shave (including legs and underarms) for at least 48 hours prior to first CHG shower. It is OK to shave your face.  Please  follow these instructions carefully.     Shower the NIGHT BEFORE SURGERY and the MORNING OF SURGERY with CHG Soap.   If you chose to wash your hair, wash your hair first as usual with your normal shampoo. After you shampoo, rinse your hair and body thoroughly to remove the shampoo.  Then [employer] and genitals (private parts) with your normal soap and rinse thoroughly to remove soap.  After that Use CHG Soap as you would any other liquid soap. You can apply CHG directly to the skin and wash gently with a scrungie or a clean washcloth.   Apply the CHG Soap to your body ONLY FROM THE NECK  DOWN.  Do not use on open wounds or open sores. Avoid contact with your eyes, ears, mouth and genitals (private parts). Wash Face and genitals (private parts)  with your normal soap.   Wash thoroughly, paying special attention to the area where your surgery will be performed.  Thoroughly rinse your body with warm water from the neck down.  DO NOT shower/wash with your normal soap after using and rinsing off the CHG Soap.  Pat yourself dry with a CLEAN TOWEL.  Wear CLEAN PAJAMAS to bed the night before surgery  Place CLEAN SHEETS on your bed the night before your surgery  DO NOT SLEEP WITH PETS.   Day of Surgery:  Take a shower with CHG soap. Wear Clean/Comfortable clothing the morning of surgery Do not apply any deodorants/lotions.   Remember to brush your teeth WITH YOUR REGULAR TOOTHPASTE.   If you received a COVID test during your pre-op visit, it is requested that you wear a mask when out in public, stay away from anyone that may not be feeling well, and notify your surgeon if you develop symptoms. If you have been in contact with anyone that has tested positive in the last 10 days, please notify your surgeon.    Please read over the following fact sheets that you were given.

## 2021-09-05 ENCOUNTER — Encounter (HOSPITAL_COMMUNITY): Payer: Self-pay

## 2021-09-05 ENCOUNTER — Encounter (HOSPITAL_COMMUNITY)
Admission: RE | Admit: 2021-09-05 | Discharge: 2021-09-05 | Disposition: A | Discharge status: Home / Self Care | Payer: Medicare Other | Source: Ambulatory Visit | Attending: Vascular Surgery | Admitting: Vascular Surgery

## 2021-09-05 ENCOUNTER — Other Ambulatory Visit: Payer: Self-pay

## 2021-09-05 VITALS — BP 122/62 | HR 82 | Temp 97.5°F | Resp 17 | Ht 68.0 in | Wt 200.1 lb

## 2021-09-05 DIAGNOSIS — I70209 Unspecified atherosclerosis of native arteries of extremities, unspecified extremity: Secondary | ICD-10-CM | POA: Diagnosis not present

## 2021-09-05 DIAGNOSIS — F17210 Nicotine dependence, cigarettes, uncomplicated: Secondary | ICD-10-CM | POA: Diagnosis not present

## 2021-09-05 DIAGNOSIS — Z01812 Encounter for preprocedural laboratory examination: Secondary | ICD-10-CM | POA: Insufficient documentation

## 2021-09-05 DIAGNOSIS — Z85828 Personal history of other malignant neoplasm of skin: Secondary | ICD-10-CM | POA: Diagnosis not present

## 2021-09-05 DIAGNOSIS — Z8616 Personal history of COVID-19: Secondary | ICD-10-CM | POA: Insufficient documentation

## 2021-09-05 DIAGNOSIS — Z7901 Long term (current) use of anticoagulants: Secondary | ICD-10-CM | POA: Diagnosis not present

## 2021-09-05 DIAGNOSIS — Z8673 Personal history of transient ischemic attack (TIA), and cerebral infarction without residual deficits: Secondary | ICD-10-CM | POA: Insufficient documentation

## 2021-09-05 DIAGNOSIS — I10 Essential (primary) hypertension: Secondary | ICD-10-CM | POA: Diagnosis not present

## 2021-09-05 DIAGNOSIS — Z952 Presence of prosthetic heart valve: Secondary | ICD-10-CM | POA: Insufficient documentation

## 2021-09-05 DIAGNOSIS — Z79899 Other long term (current) drug therapy: Secondary | ICD-10-CM | POA: Diagnosis not present

## 2021-09-05 DIAGNOSIS — I252 Old myocardial infarction: Secondary | ICD-10-CM | POA: Diagnosis not present

## 2021-09-05 DIAGNOSIS — I2510 Atherosclerotic heart disease of native coronary artery without angina pectoris: Secondary | ICD-10-CM | POA: Insufficient documentation

## 2021-09-05 DIAGNOSIS — R791 Abnormal coagulation profile: Secondary | ICD-10-CM | POA: Insufficient documentation

## 2021-09-05 DIAGNOSIS — J449 Chronic obstructive pulmonary disease, unspecified: Secondary | ICD-10-CM | POA: Insufficient documentation

## 2021-09-05 DIAGNOSIS — Z01818 Encounter for other preprocedural examination: Secondary | ICD-10-CM

## 2021-09-05 DIAGNOSIS — I700 Atherosclerosis of aorta: Secondary | ICD-10-CM | POA: Insufficient documentation

## 2021-09-05 DIAGNOSIS — Z955 Presence of coronary angioplasty implant and graft: Secondary | ICD-10-CM | POA: Diagnosis not present

## 2021-09-05 HISTORY — DX: Personal history of other diseases of the digestive system: Z87.19

## 2021-09-05 LAB — COMPREHENSIVE METABOLIC PANEL
ALT: 19 U/L (ref 0–44)
AST: 22 U/L (ref 15–41)
Albumin: 3.6 g/dL (ref 3.5–5.0)
Alkaline Phosphatase: 90 U/L (ref 38–126)
Anion gap: 7 (ref 5–15)
BUN: 18 mg/dL (ref 8–23)
CO2: 28 mmol/L (ref 22–32)
Calcium: 9.3 mg/dL (ref 8.9–10.3)
Chloride: 105 mmol/L (ref 98–111)
Creatinine, Ser: 0.94 mg/dL (ref 0.61–1.24)
GFR, Estimated: >60 mL/min (ref >60)
Glucose, Bld: 119 mg/dL — ABNORMAL HIGH (ref 70–99)
Potassium: 3.9 mmol/L (ref 3.5–5.1)
Sodium: 140 mmol/L (ref 135–145)
Total Bilirubin: 0.6 mg/dL (ref 0.3–1.2)
Total Protein: 6.4 g/dL — ABNORMAL LOW (ref 6.5–8.1)

## 2021-09-05 LAB — CBC
HCT: 45.1 % (ref 39.0–52.0)
Hemoglobin: 14.7 g/dL (ref 13.0–17.0)
MCH: 31.3 pg (ref 26.0–34.0)
MCHC: 32.6 g/dL (ref 30.0–36.0)
MCV: 96 fL (ref 80.0–100.0)
Platelets: 154 10*3/uL (ref 150–400)
RBC: 4.7 MIL/uL (ref 4.22–5.81)
RDW: 15 % (ref 11.5–15.5)
WBC: 6.7 10*3/uL (ref 4.0–10.5)
nRBC: 0 % (ref 0.0–0.2)

## 2021-09-05 LAB — PROTIME-INR
INR: 2.4 — ABNORMAL HIGH (ref 0.8–1.2)
Prothrombin Time: 25.7 seconds — ABNORMAL HIGH (ref 11.4–15.2)

## 2021-09-05 LAB — URINALYSIS, ROUTINE W REFLEX MICROSCOPIC
Bilirubin Urine: NEGATIVE
Glucose, UA: NEGATIVE mg/dL
Hgb urine dipstick: NEGATIVE
Ketones, ur: NEGATIVE mg/dL
Leukocytes,Ua: NEGATIVE
Nitrite: NEGATIVE
Protein, ur: NEGATIVE mg/dL
Specific Gravity, Urine: 1.021 (ref 1.005–1.030)
pH: 5 (ref 5.0–8.0)

## 2021-09-05 LAB — SURGICAL PCR SCREEN
MRSA, PCR: NEGATIVE
Staphylococcus aureus: NEGATIVE

## 2021-09-05 LAB — APTT: aPTT: 66 seconds — ABNORMAL HIGH (ref 24–36)

## 2021-09-05 NOTE — Progress Notes (Signed)
PCP - Dr. Jenna Luo Cardiologist - Dr. Duke Salvia  PPM/ICD - n/a Device Orders - n/a  Rep Notified - n/a  Chest x-ray -  EKG - 06/05/2021 Stress Test - denies ECHO - 07/11/2021 Cardiac Cath - 11/24/2017  Sleep Study - Per patient, done 17 in [place] CPAP - denies  No DM  Blood Thinner Instructions: Stop coumadin 3 days prior to to surgery and bridge with lovenox until surgery 6/22. Will receive further instructions from Coumadin Clinic on 6/8. Aspirin Instructions: Continue aspirin  ERAS Protcol - NPO PRE-SURGERY Ensure or G2- n/a  COVID TEST- n/a   Anesthesia review: Yes. Complex cardiac history.  Patient denies shortness of breath, fever, cough and chest pain at PAT appointment   All instructions explained to the patient, with a verbal understanding of the material. Patient agrees to go over the instructions while at home for a better understanding. Patient also instructed to self quarantine after being tested for COVID-19. The opportunity to ask questions was provided.

## 2021-09-05 NOTE — Progress Notes (Signed)
Surgical Instructions    Your procedure is scheduled on Friday, June 16th.  Report to [facility] Main Entrance "A" at 5:30 A.M., then check in with the Admitting office.  Call this number if you have problems the morning of surgery:  [phone]   If you have any questions prior to your surgery date call [phone]: Open Monday-Friday 8am-4pm    Remember:  Do not eat or drink after midnight the night before your surgery    Take these medicines the morning of surgery with A SIP OF WATER:   Carvedilol (Coreg)  Follow your surgeon's instructions on when to stop Coumadin & Aspirin.  If no instructions were given by your surgeon then you will need to call the office to get those instructions.    As of today, STOP taking any Aleve, Naproxen, Ibuprofen, Motrin, Advil, Goody's, BC's, all herbal medications, fish oil, and all vitamins.           DAY OF SURGERY: Do not wear jewelry  Do not wear lotions, powders, perfumes, or deodorant. Men may shave face and neck. Do not bring valuables to the hospital.  [facility] is not responsible for any belongings or valuables. .   Do NOT Smoke (Tobacco/Vaping)  24 hours prior to your procedure  If you use a CPAP at night, you may bring your mask for your overnight stay.   Contacts, glasses, hearing aids, dentures or partials may not be worn into surgery, please bring cases for these belongings   For patients admitted to the hospital, discharge time will be determined by your treatment team.   Patients discharged the day of surgery will not be allowed to drive home, and someone needs to stay with them for 24 hours.   SURGICAL WAITING ROOM VISITATION Patients having surgery or a procedure in a hospital may have two support people. Children under the age of 78 must have an adult with them who is not the patient. They may stay in the waiting area during the procedure and may switch out with other visitors. If the patient needs to stay at the  hospital during part of their recovery, the visitor guidelines for inpatient rooms apply.  Please refer to the [facility] website for the visitor guidelines for Inpatients (after your surgery is over and you are in a regular room).    Special instructions:    Oral Hygiene is also important to reduce your risk of infection.  Remember - BRUSH YOUR TEETH THE MORNING OF SURGERY WITH YOUR REGULAR TOOTHPASTE   [place]- Preparing For Surgery  Before surgery, you can play an important role. Because skin is not sterile, your skin needs to be as free of germs as possible. You can reduce the number of germs on your skin by washing with CHG (chlorahexidine gluconate) Soap before surgery.  CHG is an antiseptic cleaner which kills germs and bonds with the skin to continue killing germs even after washing.     Please do not use if you have an allergy to CHG or antibacterial soaps. If your skin becomes reddened/irritated stop using the CHG.  Do not shave (including legs and underarms) for at least 48 hours prior to first CHG shower. It is OK to shave your face.  Please follow these instructions carefully.     Shower the NIGHT BEFORE SURGERY and the MORNING OF SURGERY with CHG Soap.   If you chose to wash your hair, wash your hair first as usual with your normal shampoo. After  you shampoo, rinse your hair and body thoroughly to remove the shampoo.  Then [employer] and genitals (private parts) with your normal soap and rinse thoroughly to remove soap.  After that Use CHG Soap as you would any other liquid soap. You can apply CHG directly to the skin and wash gently with a scrungie or a clean washcloth.   Apply the CHG Soap to your body ONLY FROM THE NECK DOWN.  Do not use on open wounds or open sores. Avoid contact with your eyes, ears, mouth and genitals (private parts). Wash Face and genitals (private parts)  with your normal soap.   Wash thoroughly, paying special attention to the area where your  surgery will be performed.  Thoroughly rinse your body with warm water from the neck down.  DO NOT shower/wash with your normal soap after using and rinsing off the CHG Soap.  Pat yourself dry with a CLEAN TOWEL.  Wear CLEAN PAJAMAS to bed the night before surgery  Place CLEAN SHEETS on your bed the night before your surgery  DO NOT SLEEP WITH PETS.   Day of Surgery:  Take a shower with CHG soap. Wear Clean/Comfortable clothing the morning of surgery Do not apply any deodorants/lotions.   Remember to brush your teeth WITH YOUR REGULAR TOOTHPASTE.   If you received a COVID test during your pre-op visit, it is requested that you wear a mask when out in public, stay away from anyone that may not be feeling well, and notify your surgeon if you develop symptoms. If you have been in contact with anyone that has tested positive in the last 10 days, please notify your surgeon.    Please read over the following fact sheets that you were given.

## 2021-09-06 ENCOUNTER — Encounter (HOSPITAL_COMMUNITY): Payer: Self-pay

## 2021-09-06 NOTE — Anesthesia Preprocedure Evaluation (Addendum)
Anesthesia Evaluation  Patient identified by MRN, date of birth, ID band Patient awake    Reviewed: Allergy & Precautions, NPO status , Patient's Chart, lab work & pertinent test results  History of Anesthesia Complications Negative for: history of anesthetic complications  Airway Mallampati: II  TM Distance: >3 FB Neck ROM: Full    Dental  (+) Dental Advisory Given   Pulmonary COPD, Current Smoker and Patient abstained from smoking.,    Pulmonary exam normal        Cardiovascular hypertension, Pt. on home beta blockers and Pt. on medications + CAD, + Past MI (2019), + Cardiac Stents (2019) and + Peripheral Vascular Disease  Normal cardiovascular exam+ dysrhythmias Atrial Fibrillation + Valvular Problems/Murmurs (s/p mechanical AVR/MVR 2013)    Echo 07/11/21: mild LVH, EF 60-65%, mechanical aortic valve with trace AR, mechanical mitral valve (MG 5), no MR   Neuro/Psych CVA (~2012/2013), No Residual Symptoms    GI/Hepatic negative GI ROS, Neg liver ROS,   Endo/Other  negative endocrine ROS  Renal/GU negative Renal ROS  negative genitourinary   Musculoskeletal negative musculoskeletal ROS (+)   Abdominal   Peds  Hematology negative hematology ROS (+)   Anesthesia Other Findings   Reproductive/Obstetrics                           Anesthesia Physical Anesthesia Plan  ASA: 3  Anesthesia Plan: General   Post-op Pain Management: Tylenol PO (pre-op)*   Induction: Intravenous  PONV Risk Score and Plan: 1 and Ondansetron, Dexamethasone, Treatment may vary due to age or medical condition and Midazolam  Airway Management Planned: Oral ETT  Additional Equipment: Arterial line  Intra-op Plan:   Post-operative Plan: Extubation in OR  Informed Consent: I have reviewed the patients History and Physical, chart, labs and discussed the procedure including the risks, benefits and alternatives for  the proposed anesthesia with the patient or authorized representative who has indicated his/her understanding and acceptance.     Dental advisory given  Plan Discussed with:   Anesthesia Plan Comments: (PAT note written 09/06/2021 by Myra Gianotti, PA-C. )      Anesthesia Quick Evaluation

## 2021-09-06 NOTE — Progress Notes (Signed)
Anesthesia Chart Review:  Case: [id number] Date/Time: 09/14/21 0715   Procedure: RIGHT COMMON FEMORAL-ABOVE KNEE POPLITEAL ARTERY BYPASS WITH VEIN (Right)   Anesthesia type: General   Pre-op diagnosis: Superficial femoral artery occlusion   Location: [facility] OR ROOM 12 / [place] OR   Surgeons: Waynetta Sandy, MD       DISCUSSION:  Patient is a 66 year old male scheduled for the above procedure. Surgery was initially scheduled for 06/08/21, but he presented to his 06/05/21 PAT visit with some SOB/wheezing and atypical aflutter with RVR on EKG. His cardiologist is Dr. Duke Salvia with [facility], and he was seen that day and later underwent cardioversion on 06/12/21. He has been maintaining SR and is now rescheduled for surgery. He will be on a Lovenox bridge due to history of mechanical AVR/MVR.   History includes smoking (~ 5 cig/day), COPD, HTN, CAD (inferior STEMI, s/p DES dRCA 11/24/2017), rheumatic valvular disease (s/p valvuloplasty ~ 1998; s/p LAA ligation/MAZE, mechanical AVR/MVR 2013 in [place]), PAF (2013; recurrent aflutter 04/2021, s/p DCCV 06/12/21), CVA (~ 2012/2013), PVD, dyspnea, skin cancer, appendectomy (05/17/19), COVID-19 (05/16/21).     Last cardiology follow-up was on 08/29/21 with Maia Plan, NP. He was maintaining SR following successful cardioversion of aflutter on 06/12/21. TTE showed preserved LVEF, moderate-severe LA dilation. He was unable to tolerate Amiodarone. He was seen by EP on 07/17/21 and decision made for watchful waiting rather than ablation or trying a different AAD. At 08/29/21 visit, he was feeling well without new CV symptoms. She noted that FPBG had been rescheduled for 09/14/21. Six month follow-up planned. Glory Rosebush, PA-C with EP signed a cardiology clearance note.  Lovenox bridge planned while warfarin is on hold (to be held 3 days prior to surgery) per [facility]. His visit to review instructions is reportedly scheduled for 09/06/21. INR goal  2.5-3.5. He is to continue ASA.   Anesthesia team to evaluate on the day of surgery. 07/17/21 EKG tracing requested from [facility]. HR 82 bpm with PAT VS. Staff did PT/PTT at his 09/05/21 PAT visit, but his warfarin was not yet on hold. INR 2.4 and aPTT 66. His PTT has been elevated with previous draws from 06/05/21 and 05/11/19 (61-79). Will repeat PT/PTT on arrival for surgery.    VS: BP 122/62   Pulse 82   Temp (!) 36.4 C (Oral)   Resp 17   Ht '5\' 8"'$  (1.727 m)   Wt 90.8 kg   SpO2 99%   BMI 30.43 kg/m    PROVIDERS: [name], MD is PCP Elby Beck, MD is cardiologist ([facility]) Lonni Fix, MD is EP cardiologist ([facility])   LABS: Preoperative labs noted. See DISCUSSION. (all labs ordered are listed, but only abnormal results are displayed)  Labs Reviewed  COMPREHENSIVE METABOLIC PANEL - Abnormal; Notable for the following components:      Result Value   Glucose, Bld 119 (*)    Total Protein 6.4 (*)    All other components within normal limits  PROTIME-INR - Abnormal; Notable for the following components:   Prothrombin Time 25.7 (*)    INR 2.4 (*)    All other components within normal limits  APTT - Abnormal; Notable for the following components:   aPTT 66 (*)    All other components within normal limits  SURGICAL PCR SCREEN  CBC  URINALYSIS, ROUTINE W REFLEX MICROSCOPIC  TYPE AND SCREEN     IMAGES: CT Chest lung cancer screen 07/11/21: IMPRESSION: 1. Lung-RADS 2, benign appearance  or behavior. Continue annual screening with low-dose chest CT without contrast in 12 months. S modifier for coronary artery calcifications. 2. Aortic Atherosclerosis (ICD10-I70.0).   EKG:  EKG 07/17/21 ([facility]): Tracing requested. Per Narrative in [facility]: Sinus rhythm  Rightward axis  Otherwise normal  When compared with ECG of 12-Jun-2021 09:49,  Premature ventricular complexes are no longer present  PR interval has decreased  Confirmed by Norville Haggard ([id number]) on 07/17/2021  3:00:37 PM   EKG 06/05/21: Atypical atrial flutter at 121 bpm   CV: Echo 07/11/21 ([facility]): SUMMARY  The left ventricular size is normal.  Mild left ventricular hypertrophy.  Left ventricular systolic function is normal.  LV ejection fraction = 60-65%.  The right ventricle is moderately dilated.  The left atrium is moderately to severely dilated.  There is a mechanical aortic valve.  There is trace aortic regurgitation.  There is a mechanical mitral valve.  The mean gradient across the mitral valve is 5 mmHg.  The heart rate for the mean mitral valve gradient is 80 BPM.  There is no mitral regurgitation noted.  The ascending aorta is normal size.  IVC size was normal.  There is no pericardial effusion.  Difficult to compare to the prior study due to different image  quality.     Aortogram with BLE angiography 04/16/21: Findings: The aortoiliac segments are free of flow-limiting stenosis.  Otherwise without stenosis.  Minimal calcification distal aortic bifurcation. Hypogastric arteries are patent as are the common femoral arteries bilaterally.  The SFA on the right is diminutive in the profunda femoris artery is handling the bulk of the flow in the thigh.  The right SFA is subtotally occluded in the mid thigh for approximately 10 cm and reconstitutes the above-knee popliteal artery and there are 3 vessels leading to the right foot.  On the left side the SFA does have 2 areas of nonoccluding stenosis and again there is three-vessel runoff to the left foot.    Cardiac cath 11/24/17 ([facility]):  Single-vessel coronary artery disease.   LCP for inferior STEMI.  Has mechanical aortic and mitral valves but no coronary revascularization.   Right radial access.  Left side with only mild non obstructive disease.  100% distal RCA lesion, thrombotic appearance  Stented with 3.0x16 mm Synergy DES, post dilated with 3.25 [place].  Good angiographic result.  No IIBIIIA inhibitor because  therapeutic INR.     Past Medical History:  Diagnosis Date   Anginal pain ([place])    Cancer ([place])    skin   COPD (chronic obstructive pulmonary disease) ([facility])    Coronary artery disease    Dyspnea    Dysrhythmia    afib, s/p LAA ligation/MAZE 2013; recurrent aflutter 04/2021   History of hiatal hernia    medication related in 20s   Hx of artificial heart valve replacement    Hypertension    Mitral insufficiency and aortic stenosis    mechanical AVR/MVR 2013 in [place]   Myocardial infarction ([place])    Pneumonia    as a child   PVD (peripheral vascular disease) ([place])    Rheumatic heart disease    Stroke [facility])     Past Surgical History:  Procedure Laterality Date   ABDOMINAL AORTOGRAM W/LOWER EXTREMITY Bilateral 04/16/2021   Procedure: ABDOMINAL AORTOGRAM W/LOWER EXTREMITY;  Surgeon: Waynetta Sandy, MD;  Location: [facility];  Service: Cardiovascular;  Laterality: Bilateral;   AORTIC AND MITRAL VALVE REPLACEMENT  2013   mechanical AVR/MVR  in [place]  04/20/2020   Procedure: BIOPSY;  Surgeon: [name], MD;  Location: [facility];  Service: Gastroenterology;;   CARDIAC VALVE REPLACEMENT     COLONOSCOPY WITH PROPOFOL N/A 04/20/2020   Procedure: COLONOSCOPY WITH PROPOFOL;  Surgeon: [name], MD;  Location: [facility];  Service: Gastroenterology;  Laterality: N/A;   CORONARY ANGIOPLASTY WITH STENT PLACEMENT  10/2017   LAPAROSCOPIC APPENDECTOMY N/A 05/17/2019   Procedure: INTERVAL LAPAROSCOPIC APPENDECTOMY;  Surgeon: Greer Pickerel, MD;  Location: [facility];  Service: General;  Laterality: N/A;   MAZE  2013   atrial appendage ligation and maze procedure in 2013   POLYPECTOMY  04/20/2020   Procedure: POLYPECTOMY;  Surgeon: [name], MD;  Location: [facility];  Service: Gastroenterology;;    MEDICATIONS:  aspirin EC 81 MG tablet   atorvastatin (LIPITOR) 80 MG tablet   carvedilol (COREG) 3.125 MG tablet   enalapril (VASOTEC) 5 MG tablet    enoxaparin (LOVENOX) 100 MG/ML injection   silver sulfADIAZINE (SILVADENE) 1 % cream   warfarin (COUMADIN) 1 MG tablet   warfarin (COUMADIN) 6 MG tablet   No current facility-administered medications for this encounter.    Myra Gianotti, PA-C Surgical Short Stay/Anesthesiology [facility] Phone [phone] [facility] Phone [phone] 09/06/2021 3:54 PM

## 2021-09-13 ENCOUNTER — Encounter (HOSPITAL_COMMUNITY): Payer: Self-pay | Admitting: Vascular Surgery

## 2021-09-14 ENCOUNTER — Encounter (HOSPITAL_COMMUNITY)
Admission: RE | Disposition: A | Discharge status: Home / Self Care | Payer: Self-pay | Source: Ambulatory Visit | Attending: Vascular Surgery

## 2021-09-14 ENCOUNTER — Inpatient Hospital Stay (HOSPITAL_COMMUNITY)
Admission: RE | Admit: 2021-09-14 | Discharge: 2021-10-10 | DRG: 252 | Disposition: A | Discharge status: Home / Self Care | Payer: Medicare Other | Source: Ambulatory Visit | Attending: Vascular Surgery | Admitting: Vascular Surgery

## 2021-09-14 ENCOUNTER — Other Ambulatory Visit: Payer: Self-pay

## 2021-09-14 ENCOUNTER — Inpatient Hospital Stay (HOSPITAL_COMMUNITY): Payer: Medicare Other | Admitting: Physician Assistant

## 2021-09-14 ENCOUNTER — Inpatient Hospital Stay (HOSPITAL_COMMUNITY): Payer: Medicare Other | Admitting: Vascular Surgery

## 2021-09-14 DIAGNOSIS — I252 Old myocardial infarction: Secondary | ICD-10-CM

## 2021-09-14 DIAGNOSIS — I2510 Atherosclerotic heart disease of native coronary artery without angina pectoris: Secondary | ICD-10-CM | POA: Diagnosis present

## 2021-09-14 DIAGNOSIS — Z8673 Personal history of transient ischemic attack (TIA), and cerebral infarction without residual deficits: Secondary | ICD-10-CM | POA: Diagnosis not present

## 2021-09-14 DIAGNOSIS — Z7902 Long term (current) use of antithrombotics/antiplatelets: Secondary | ICD-10-CM | POA: Diagnosis not present

## 2021-09-14 DIAGNOSIS — Z8052 Family history of malignant neoplasm of bladder: Secondary | ICD-10-CM | POA: Diagnosis not present

## 2021-09-14 DIAGNOSIS — Z7901 Long term (current) use of anticoagulants: Secondary | ICD-10-CM | POA: Diagnosis not present

## 2021-09-14 DIAGNOSIS — Z85828 Personal history of other malignant neoplasm of skin: Secondary | ICD-10-CM | POA: Diagnosis not present

## 2021-09-14 DIAGNOSIS — D62 Acute posthemorrhagic anemia: Secondary | ICD-10-CM | POA: Diagnosis not present

## 2021-09-14 DIAGNOSIS — I772 Rupture of artery: Secondary | ICD-10-CM | POA: Diagnosis not present

## 2021-09-14 DIAGNOSIS — R569 Unspecified convulsions: Secondary | ICD-10-CM | POA: Diagnosis not present

## 2021-09-14 DIAGNOSIS — R310 Gross hematuria: Secondary | ICD-10-CM | POA: Diagnosis not present

## 2021-09-14 DIAGNOSIS — Z808 Family history of malignant neoplasm of other organs or systems: Secondary | ICD-10-CM | POA: Diagnosis not present

## 2021-09-14 DIAGNOSIS — I4891 Unspecified atrial fibrillation: Secondary | ICD-10-CM | POA: Diagnosis present

## 2021-09-14 DIAGNOSIS — N400 Enlarged prostate without lower urinary tract symptoms: Secondary | ICD-10-CM | POA: Diagnosis present

## 2021-09-14 DIAGNOSIS — I10 Essential (primary) hypertension: Secondary | ICD-10-CM | POA: Diagnosis present

## 2021-09-14 DIAGNOSIS — J449 Chronic obstructive pulmonary disease, unspecified: Secondary | ICD-10-CM | POA: Diagnosis present

## 2021-09-14 DIAGNOSIS — F17210 Nicotine dependence, cigarettes, uncomplicated: Secondary | ICD-10-CM | POA: Diagnosis present

## 2021-09-14 DIAGNOSIS — Z8249 Family history of ischemic heart disease and other diseases of the circulatory system: Secondary | ICD-10-CM

## 2021-09-14 DIAGNOSIS — L7632 Postprocedural hematoma of skin and subcutaneous tissue following other procedure: Secondary | ICD-10-CM | POA: Diagnosis not present

## 2021-09-14 DIAGNOSIS — R791 Abnormal coagulation profile: Secondary | ICD-10-CM

## 2021-09-14 DIAGNOSIS — T8119XA Other postprocedural shock, initial encounter: Secondary | ICD-10-CM | POA: Diagnosis not present

## 2021-09-14 DIAGNOSIS — Z952 Presence of prosthetic heart valve: Secondary | ICD-10-CM | POA: Diagnosis not present

## 2021-09-14 DIAGNOSIS — L97919 Non-pressure chronic ulcer of unspecified part of right lower leg with unspecified severity: Secondary | ICD-10-CM | POA: Diagnosis present

## 2021-09-14 DIAGNOSIS — T8131XA Disruption of external operation (surgical) wound, not elsewhere classified, initial encounter: Secondary | ICD-10-CM | POA: Diagnosis not present

## 2021-09-14 DIAGNOSIS — Z801 Family history of malignant neoplasm of trachea, bronchus and lung: Secondary | ICD-10-CM

## 2021-09-14 DIAGNOSIS — Y838 Other surgical procedures as the cause of abnormal reaction of the patient, or of later complication, without mention of misadventure at the time of the procedure: Secondary | ICD-10-CM | POA: Diagnosis not present

## 2021-09-14 DIAGNOSIS — I739 Peripheral vascular disease, unspecified: Secondary | ICD-10-CM | POA: Diagnosis present

## 2021-09-14 DIAGNOSIS — Z7982 Long term (current) use of aspirin: Secondary | ICD-10-CM

## 2021-09-14 DIAGNOSIS — Z833 Family history of diabetes mellitus: Secondary | ICD-10-CM

## 2021-09-14 DIAGNOSIS — R578 Other shock: Secondary | ICD-10-CM | POA: Diagnosis not present

## 2021-09-14 DIAGNOSIS — R008 Other abnormalities of heart beat: Secondary | ICD-10-CM | POA: Diagnosis not present

## 2021-09-14 DIAGNOSIS — I9581 Postprocedural hypotension: Secondary | ICD-10-CM | POA: Diagnosis not present

## 2021-09-14 DIAGNOSIS — I97638 Postprocedural hematoma of a circulatory system organ or structure following other circulatory system procedure: Secondary | ICD-10-CM | POA: Diagnosis not present

## 2021-09-14 HISTORY — PX: FEMORAL-POPLITEAL BYPASS GRAFT: SHX937

## 2021-09-14 LAB — PROTIME-INR
INR: 1.1 (ref 0.8–1.2)
Prothrombin Time: 14 seconds (ref 11.4–15.2)

## 2021-09-14 LAB — POCT ACTIVATED CLOTTING TIME
Activated Clotting Time: 245 seconds
Activated Clotting Time: 311 seconds

## 2021-09-14 LAB — HEPARIN LEVEL (UNFRACTIONATED): Heparin Unfractionated: 0.47 IU/mL (ref 0.30–0.70)

## 2021-09-14 LAB — APTT: aPTT: 44 seconds — ABNORMAL HIGH (ref 24–36)

## 2021-09-14 SURGERY — BYPASS GRAFT FEMORAL-POPLITEAL ARTERY
Anesthesia: General | Site: Leg Upper | Laterality: Right

## 2021-09-14 MED ORDER — POLYETHYLENE GLYCOL 3350 17 G PO PACK
17.0000 g | PACK | Freq: Every day | ORAL | Status: DC | PRN
  Administered 2021-09-23 – 2021-09-25 (×2): 17 g via ORAL
  Filled 2021-09-14 (×2): qty 1

## 2021-09-14 MED ORDER — LACTATED RINGERS IV SOLN: INTRAVENOUS | Status: DC

## 2021-09-14 MED ORDER — BISACODYL 10 MG RE SUPP
10.0000 mg | Freq: Every day | RECTAL | Status: DC | PRN
  Filled 2021-09-14: qty 1

## 2021-09-14 MED ORDER — MIDAZOLAM HCL 2 MG/2ML IJ SOLN
INTRAMUSCULAR | Status: DC | PRN
  Administered 2021-09-14: 2 mg via INTRAVENOUS

## 2021-09-14 MED ORDER — PROTAMINE SULFATE 10 MG/ML IV SOLN
INTRAVENOUS | Status: DC | PRN
  Administered 2021-09-14: 50 mg via INTRAVENOUS

## 2021-09-14 MED ORDER — LACTATED RINGERS IV SOLN: INTRAVENOUS | Status: DC | PRN

## 2021-09-14 MED ORDER — PROPOFOL 10 MG/ML IV BOLUS
INTRAVENOUS | Status: DC | PRN
  Administered 2021-09-14: 150 mg via INTRAVENOUS

## 2021-09-14 MED ORDER — PHENOL 1.4 % MT LIQD: 1.0000 | OROMUCOSAL | Status: DC | PRN

## 2021-09-14 MED ORDER — POTASSIUM CHLORIDE CRYS ER 20 MEQ PO TBCR
20.0000 meq | EXTENDED_RELEASE_TABLET | Freq: Every day | ORAL | Status: AC | PRN
  Administered 2021-09-23: 40 meq via ORAL
  Filled 2021-09-14: qty 2

## 2021-09-14 MED ORDER — ENALAPRIL MALEATE 2.5 MG PO TABS
5.0000 mg | ORAL_TABLET | Freq: Every day | ORAL | Status: DC
  Administered 2021-09-15: 5 mg via ORAL
  Filled 2021-09-14: qty 2
  Filled 2021-09-14: qty 1

## 2021-09-14 MED ORDER — PROTAMINE SULFATE 10 MG/ML IV SOLN
INTRAVENOUS | Status: AC
Start: 2021-09-14 — End: ?
  Filled 2021-09-14: qty 5

## 2021-09-14 MED ORDER — GUAIFENESIN-DM 100-10 MG/5ML PO SYRP: 15.0000 mL | ORAL_SOLUTION | ORAL | Status: DC | PRN

## 2021-09-14 MED ORDER — FENTANYL CITRATE (PF) 100 MCG/2ML IJ SOLN: 25.0000 ug | INTRAMUSCULAR | Status: DC | PRN

## 2021-09-14 MED ORDER — SODIUM CHLORIDE 0.9 % IV SOLN: INTRAVENOUS | Status: DC

## 2021-09-14 MED ORDER — CHLORHEXIDINE GLUCONATE CLOTH 2 % EX PADS: 6.0000 | MEDICATED_PAD | Freq: Once | CUTANEOUS | Status: DC

## 2021-09-14 MED ORDER — PROPOFOL 10 MG/ML IV BOLUS
INTRAVENOUS | Status: AC
  Filled 2021-09-14: qty 20

## 2021-09-14 MED ORDER — DEXAMETHASONE SODIUM PHOSPHATE 10 MG/ML IJ SOLN
INTRAMUSCULAR | Status: AC
  Filled 2021-09-14: qty 1

## 2021-09-14 MED ORDER — ACETAMINOPHEN 650 MG RE SUPP: 325.0000 mg | RECTAL | Status: DC | PRN

## 2021-09-14 MED ORDER — LABETALOL HCL 5 MG/ML IV SOLN: 10.0000 mg | INTRAVENOUS | Status: DC | PRN

## 2021-09-14 MED ORDER — CHLORHEXIDINE GLUCONATE 0.12 % MT SOLN
15.0000 mL | Freq: Once | OROMUCOSAL | Status: AC
  Administered 2021-09-14: 15 mL via OROMUCOSAL
  Filled 2021-09-14: qty 15

## 2021-09-14 MED ORDER — CARVEDILOL 6.25 MG PO TABS
6.2500 mg | ORAL_TABLET | Freq: Two times a day (BID) | ORAL | Status: DC
  Administered 2021-09-14 – 2021-10-10 (×48): 6.25 mg via ORAL
  Filled 2021-09-14 (×50): qty 1

## 2021-09-14 MED ORDER — OXYCODONE HCL 5 MG/5ML PO SOLN: 5.0000 mg | Freq: Once | ORAL | Status: DC | PRN

## 2021-09-14 MED ORDER — AMISULPRIDE (ANTIEMETIC) 5 MG/2ML IV SOLN: 10.0000 mg | Freq: Once | INTRAVENOUS | Status: DC | PRN

## 2021-09-14 MED ORDER — ORAL CARE MOUTH RINSE: 15.0000 mL | Freq: Once | OROMUCOSAL | Status: AC

## 2021-09-14 MED ORDER — HYDRALAZINE HCL 20 MG/ML IJ SOLN: 5.0000 mg | INTRAMUSCULAR | Status: DC | PRN

## 2021-09-14 MED ORDER — ASPIRIN 81 MG PO TBEC
81.0000 mg | DELAYED_RELEASE_TABLET | Freq: Every day | ORAL | Status: DC
  Administered 2021-09-15 – 2021-10-10 (×26): 81 mg via ORAL
  Filled 2021-09-14 (×26): qty 1

## 2021-09-14 MED ORDER — MORPHINE SULFATE (PF) 2 MG/ML IV SOLN: 2.0000 mg | INTRAVENOUS | Status: DC | PRN

## 2021-09-14 MED ORDER — ALUM & MAG HYDROXIDE-SIMETH 200-200-20 MG/5ML PO SUSP: 15.0000 mL | ORAL | Status: DC | PRN

## 2021-09-14 MED ORDER — SODIUM CHLORIDE 0.9 % IV SOLN: 500.0000 mL | Freq: Once | INTRAVENOUS | Status: DC | PRN

## 2021-09-14 MED ORDER — ACETAMINOPHEN 500 MG PO TABS
1000.0000 mg | ORAL_TABLET | Freq: Once | ORAL | Status: AC
  Administered 2021-09-14: 1000 mg via ORAL
  Filled 2021-09-14: qty 2

## 2021-09-14 MED ORDER — 0.9 % SODIUM CHLORIDE (POUR BTL) OPTIME
TOPICAL | Status: DC | PRN
  Administered 2021-09-14 (×2): 1000 mL

## 2021-09-14 MED ORDER — MAGNESIUM SULFATE 2 GM/50ML IV SOLN: 2.0000 g | Freq: Every day | INTRAVENOUS | Status: DC | PRN

## 2021-09-14 MED ORDER — PANTOPRAZOLE SODIUM 40 MG PO TBEC
40.0000 mg | DELAYED_RELEASE_TABLET | Freq: Every day | ORAL | Status: DC
  Administered 2021-09-14 – 2021-10-10 (×27): 40 mg via ORAL
  Filled 2021-09-14 (×27): qty 1

## 2021-09-14 MED ORDER — ONDANSETRON HCL 4 MG/2ML IJ SOLN: 4.0000 mg | Freq: Once | INTRAMUSCULAR | Status: DC | PRN

## 2021-09-14 MED ORDER — HEPARIN (PORCINE) 25000 UT/250ML-% IV SOLN
1400.0000 [IU]/h | INTRAVENOUS | Status: DC
Start: 2021-09-14 — End: 2021-09-15
  Administered 2021-09-14 – 2021-09-15 (×2): 1400 [IU]/h via INTRAVENOUS
  Filled 2021-09-14 (×2): qty 250

## 2021-09-14 MED ORDER — HEPARIN SODIUM (PORCINE) 1000 UNIT/ML IJ SOLN
INTRAMUSCULAR | Status: DC | PRN
  Administered 2021-09-14: 10000 [IU] via INTRAVENOUS

## 2021-09-14 MED ORDER — HEPARIN SODIUM (PORCINE) 1000 UNIT/ML IJ SOLN
INTRAMUSCULAR | Status: AC
Start: 2021-09-14 — End: ?
  Filled 2021-09-14: qty 10

## 2021-09-14 MED ORDER — ONDANSETRON HCL 4 MG/2ML IJ SOLN
INTRAMUSCULAR | Status: AC
  Filled 2021-09-14: qty 2

## 2021-09-14 MED ORDER — SUGAMMADEX SODIUM 200 MG/2ML IV SOLN
INTRAVENOUS | Status: DC | PRN
  Administered 2021-09-14: 200 mg via INTRAVENOUS

## 2021-09-14 MED ORDER — PHENYLEPHRINE HCL-NACL 20-0.9 MG/250ML-% IV SOLN
INTRAVENOUS | Status: DC | PRN
  Administered 2021-09-14: 15 ug/min via INTRAVENOUS

## 2021-09-14 MED ORDER — OXYCODONE-ACETAMINOPHEN 5-325 MG PO TABS
1.0000 | ORAL_TABLET | ORAL | Status: DC | PRN
  Administered 2021-09-14 – 2021-09-15 (×2): 2 via ORAL
  Filled 2021-09-14 (×2): qty 2

## 2021-09-14 MED ORDER — SUFENTANIL CITRATE 50 MCG/ML IV SOLN
INTRAVENOUS | Status: DC | PRN
  Administered 2021-09-14 (×2): 10 ug via INTRAVENOUS

## 2021-09-14 MED ORDER — LIDOCAINE 2% (20 MG/ML) 5 ML SYRINGE
INTRAMUSCULAR | Status: DC | PRN
  Administered 2021-09-14: 100 mg via INTRAVENOUS

## 2021-09-14 MED ORDER — METOPROLOL TARTRATE 5 MG/5ML IV SOLN: 2.0000 mg | INTRAVENOUS | Status: DC | PRN

## 2021-09-14 MED ORDER — DOCUSATE SODIUM 100 MG PO CAPS
100.0000 mg | ORAL_CAPSULE | Freq: Every day | ORAL | Status: DC
  Administered 2021-09-16 – 2021-10-10 (×16): 100 mg via ORAL
  Filled 2021-09-14 (×24): qty 1

## 2021-09-14 MED ORDER — SILVER SULFADIAZINE 1 % EX CREA
1.0000 | TOPICAL_CREAM | Freq: Every day | CUTANEOUS | Status: DC | PRN
Start: 2021-09-14 — End: 2021-10-10

## 2021-09-14 MED ORDER — CEFAZOLIN SODIUM-DEXTROSE 2-4 GM/100ML-% IV SOLN
2.0000 g | INTRAVENOUS | Status: DC
  Filled 2021-09-14: qty 100

## 2021-09-14 MED ORDER — OXYCODONE HCL 5 MG PO TABS: 5.0000 mg | ORAL_TABLET | Freq: Once | ORAL | Status: DC | PRN

## 2021-09-14 MED ORDER — ATORVASTATIN CALCIUM 80 MG PO TABS
80.0000 mg | ORAL_TABLET | Freq: Every evening | ORAL | Status: DC
  Administered 2021-09-14 – 2021-10-09 (×25): 80 mg via ORAL
  Filled 2021-09-14 (×25): qty 1

## 2021-09-14 MED ORDER — LIDOCAINE 2% (20 MG/ML) 5 ML SYRINGE
INTRAMUSCULAR | Status: AC
Start: 2021-09-14 — End: ?
  Filled 2021-09-14: qty 5

## 2021-09-14 MED ORDER — MIDAZOLAM HCL 2 MG/2ML IJ SOLN
INTRAMUSCULAR | Status: AC
Start: 2021-09-14 — End: ?
  Filled 2021-09-14: qty 2

## 2021-09-14 MED ORDER — SUFENTANIL CITRATE 50 MCG/ML IV SOLN
INTRAVENOUS | Status: AC
  Filled 2021-09-14: qty 1

## 2021-09-14 MED ORDER — ROCURONIUM BROMIDE 10 MG/ML (PF) SYRINGE
PREFILLED_SYRINGE | INTRAVENOUS | Status: AC
  Filled 2021-09-14: qty 10

## 2021-09-14 MED ORDER — HEPARIN 6000 UNIT IRRIGATION SOLUTION
Status: DC | PRN
  Administered 2021-09-14: 1

## 2021-09-14 MED ORDER — DEXAMETHASONE SODIUM PHOSPHATE 10 MG/ML IJ SOLN
INTRAMUSCULAR | Status: DC | PRN
  Administered 2021-09-14: 10 mg via INTRAVENOUS

## 2021-09-14 MED ORDER — ACETAMINOPHEN 325 MG PO TABS: 325.0000 mg | ORAL_TABLET | ORAL | Status: DC | PRN

## 2021-09-14 MED ORDER — ONDANSETRON HCL 4 MG/2ML IJ SOLN: 4.0000 mg | Freq: Four times a day (QID) | INTRAMUSCULAR | Status: DC | PRN

## 2021-09-14 MED ORDER — ROCURONIUM 10MG/ML (10ML) SYRINGE FOR MEDFUSION PUMP - OPTIME
INTRAVENOUS | Status: DC | PRN
  Administered 2021-09-14: 100 mg via INTRAVENOUS
  Administered 2021-09-14: 50 mg via INTRAVENOUS

## 2021-09-14 MED ORDER — ONDANSETRON HCL 4 MG/2ML IJ SOLN
INTRAMUSCULAR | Status: DC | PRN
  Administered 2021-09-14: 4 mg via INTRAVENOUS

## 2021-09-14 MED ORDER — STERILE WATER FOR IRRIGATION IR SOLN
Status: DC | PRN
  Administered 2021-09-14: 1000 mL

## 2021-09-14 MED ORDER — CHLORHEXIDINE GLUCONATE CLOTH 2 % EX PADS
6.0000 | MEDICATED_PAD | Freq: Once | CUTANEOUS | Status: DC
  Administered 2021-09-14: 6 via TOPICAL

## 2021-09-14 MED ORDER — PROTAMINE SULFATE 10 MG/ML IV SOLN
INTRAVENOUS | Status: AC
  Filled 2021-09-14: qty 5

## 2021-09-14 SURGICAL SUPPLY — 39 items
CANISTER SUCT 3000ML PPV (MISCELLANEOUS) ×2 IMPLANT
CANNULA VESSEL 3MM 2 BLNT TIP (CANNULA) ×1 IMPLANT
CLIP LIGATING EXTRA MED SLVR (CLIP) ×2 IMPLANT
CLIP LIGATING EXTRA SM BLUE (MISCELLANEOUS) ×3 IMPLANT
COVER PROBE W GEL 5X96 (DRAPES) ×1 IMPLANT
DERMABOND ADVANCED (GAUZE/BANDAGES/DRESSINGS) ×3
DERMABOND ADVANCED .7 DNX12 (GAUZE/BANDAGES/DRESSINGS) ×1 IMPLANT
DRAPE HALF SHEET 40X57 (DRAPES) ×1 IMPLANT
DRAPE INCISE IOBAN 66X45 STRL (DRAPES) ×1 IMPLANT
ELECT REM PT RETURN 9FT ADLT (ELECTROSURGICAL) ×2
ELECTRODE REM PT RTRN 9FT ADLT (ELECTROSURGICAL) ×1 IMPLANT
GLOVE BIO SURGEON STRL SZ7.5 (GLOVE) ×2 IMPLANT
GOWN STRL REUS W/ TWL LRG LVL3 (GOWN DISPOSABLE) ×2 IMPLANT
GOWN STRL REUS W/ TWL XL LVL3 (GOWN DISPOSABLE) ×1 IMPLANT
GOWN STRL REUS W/TWL LRG LVL3 (GOWN DISPOSABLE) ×2
GOWN STRL REUS W/TWL XL LVL3 (GOWN DISPOSABLE) ×1
HEMOSTAT SNOW SURGICEL 2X4 (HEMOSTASIS) IMPLANT
INSERT FOGARTY SM (MISCELLANEOUS) IMPLANT
KIT BASIN OR (CUSTOM PROCEDURE TRAY) ×2 IMPLANT
KIT TURNOVER KIT B (KITS) ×2 IMPLANT
NS IRRIG 1000ML POUR BTL (IV SOLUTION) ×4 IMPLANT
PACK PERIPHERAL VASCULAR (CUSTOM PROCEDURE TRAY) ×2 IMPLANT
PAD ARMBOARD 7.5X6 YLW CONV (MISCELLANEOUS) ×4 IMPLANT
SPONGE T-LAP 18X18 ~~LOC~~+RFID (SPONGE) ×1 IMPLANT
SUT MNCRL AB 4-0 PS2 18 (SUTURE) ×6 IMPLANT
SUT PROLENE 5 0 C 1 24 (SUTURE) ×4 IMPLANT
SUT PROLENE 6 0 BV (SUTURE) ×5 IMPLANT
SUT SILK 2 0 SH (SUTURE) ×2 IMPLANT
SUT SILK 3 0 (SUTURE) ×2
SUT SILK 3-0 18XBRD TIE 12 (SUTURE) IMPLANT
SUT VIC AB 2-0 CT1 27 (SUTURE) ×1
SUT VIC AB 2-0 CT1 TAPERPNT 27 (SUTURE) ×2 IMPLANT
SUT VIC AB 3-0 SH 27 (SUTURE) ×4
SUT VIC AB 3-0 SH 27X BRD (SUTURE) ×2 IMPLANT
TOWEL GREEN STERILE (TOWEL DISPOSABLE) ×2 IMPLANT
TRAY FOLEY MTR SLVR 16FR STAT (SET/KITS/TRAYS/PACK) ×2 IMPLANT
TUBE CONNECTING 20X1/4 (TUBING) ×1 IMPLANT
UNDERPAD 30X36 HEAVY ABSORB (UNDERPADS AND DIAPERS) ×2 IMPLANT
WATER STERILE IRR 1000ML POUR (IV SOLUTION) ×2 IMPLANT

## 2021-09-14 NOTE — Anesthesia Postprocedure Evaluation (Signed)
Anesthesia Post Note  Patient: [name]  Procedure(s) Performed: RIGHT COMMON FEMORAL-ABOVE KNEE POPLITEAL ARTERY BYPASS WITH VEIN (Right: Leg Upper)     Patient location during evaluation: PACU Anesthesia Type: General Level of consciousness: awake and alert Pain management: pain level controlled Vital Signs Assessment: post-procedure vital signs reviewed and stable Respiratory status: spontaneous breathing, nonlabored ventilation and respiratory function stable Cardiovascular status: blood pressure returned to baseline and stable Postop Assessment: no apparent nausea or vomiting Anesthetic complications: no   No notable events documented.  Last Vitals:  Vitals:   09/14/21 1320 09/14/21 1400  BP: 125/62 131/65  Pulse: 71 72  Resp: 20 18  Temp: 36.5 C   SpO2: 98% 97%    Last Pain:  Vitals:   09/14/21 1320  TempSrc: Oral  PainSc: 0-No pain                 Lidia Collum

## 2021-09-14 NOTE — H&P (Signed)
H+P    History of Present Illness: This is a 66 y.o. 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  He has a very large refluxing right greater saphenous vein and also has occlusive SFA disease.  He now presents for bypass surgery.  Past Medical History:  Diagnosis Date   Anginal pain ([place])    Cancer ([place])    skin   COPD (chronic obstructive pulmonary disease) (HCC)    Coronary artery disease    Dyspnea    Dysrhythmia    afib, s/p LAA ligation/MAZE 2013; recurrent aflutter 04/2021   History of hiatal hernia    medication related in 20s   Hx of artificial heart valve replacement    Hypertension    Mitral insufficiency and aortic stenosis    mechanical AVR/MVR 2013 in [place]   Myocardial infarction ([place])    Pneumonia    as a child   PVD (peripheral vascular disease) ([place])    Rheumatic heart disease    Stroke [facility])     Past Surgical History:  Procedure Laterality Date   ABDOMINAL AORTOGRAM W/LOWER EXTREMITY Bilateral 04/16/2021   Procedure: ABDOMINAL AORTOGRAM W/LOWER EXTREMITY;  Surgeon: Waynetta Sandy, MD;  Location: [place] [facility];  Service: Cardiovascular;  Laterality: Bilateral;   AORTIC AND MITRAL VALVE REPLACEMENT  2013   mechanical AVR/MVR in [place]  04/20/2020   Procedure: BIOPSY;  Surgeon: [name], MD;  Location: [facility];  Service: Gastroenterology;;   CARDIAC VALVE REPLACEMENT     COLONOSCOPY WITH PROPOFOL N/A 04/20/2020   Procedure: COLONOSCOPY WITH PROPOFOL;  Surgeon: [name], MD;  Location: [facility];  Service: Gastroenterology;  Laterality: N/A;   CORONARY ANGIOPLASTY WITH STENT PLACEMENT  10/2017   LAPAROSCOPIC APPENDECTOMY N/A 05/17/2019   Procedure: INTERVAL LAPAROSCOPIC APPENDECTOMY;  Surgeon: Greer Pickerel, MD;  Location: [facility];  Service: General;  Laterality: N/A;   MAZE  2013   atrial appendage ligation and maze procedure in 2013   POLYPECTOMY  04/20/2020   Procedure:  POLYPECTOMY;  Surgeon: [name], MD;  Location: [facility];  Service: Gastroenterology;;    Not on File  Prior to Admission medications   Medication Sig Start Date End Date Taking? Authorizing Provider  aspirin EC 81 MG tablet Take 81 mg by mouth daily. Swallow whole.   Yes [provider]  atorvastatin (LIPITOR) 80 MG tablet Take 80 mg by mouth every evening.    Yes [provider]  carvedilol (COREG) 3.125 MG tablet Take 6.25 mg by mouth 2 (two) times daily with a meal.   Yes [provider]  enalapril (VASOTEC) 5 MG tablet Take 5 mg by mouth daily.   Yes [provider]  silver sulfADIAZINE (SILVADENE) 1 % cream Apply 1 application. topically daily. Patient taking differently: Apply 1 application  topically daily as needed (wound care). 06/07/21  Yes [name], MD  warfarin (COUMADIN) 1 MG tablet Take 0.5-1 mg by mouth See admin instructions. Take 1 mg with 6 mg to equal 7 mg every Mon, Wed, and Fri  Take 0.5 mg with the 6 mg to equal 6.5 mg every Sun, Tues, Thurs, and Sat   Yes [provider]  warfarin (COUMADIN) 6 MG tablet Take 6 mg by mouth daily. 02/21/21  Yes [provider]  enoxaparin (LOVENOX) 100 MG/ML injection Inject 90 mg into the skin every 12 (twelve) hours. 05/07/21   [provider]  Social History   Socioeconomic History   Marital status: Married    Spouse name: Not on file   Number of children: Not on file   Years of education: Not on file   Highest education level: Not on file  Occupational History   Not on file  Tobacco Use   Smoking status: Some Days    Years: 15.00    Types: Cigarettes   Smokeless tobacco: Never  Vaping Use   Vaping Use: Never used  Substance and Sexual Activity   Alcohol use: Yes    Comment: ocassional   Drug use: Never   Sexual activity: Not on file  Other Topics Concern   Not on file  Social History Narrative   Not on file   Social Determinants of  Health   Financial Resource Strain: Not on file  Food Insecurity: Not on file  Transportation Needs: Not on file  Physical Activity: Not on file  Stress: Not on file  Social Connections: Not on file  Intimate Partner Violence: Not on file    Family History  Problem Relation Age of Onset   Bladder Cancer Mother    Lung cancer Mother    Brain cancer Mother    Heart disease Father    Heart disease Sister    Diabetes Maternal Grandfather     ROS:  Wounds healing  Physical Examination  Vitals:   09/14/21 0542  BP: 137/64  Pulse: 78  Resp: 18  Temp: 98 F (36.7 C)  SpO2: 95%   Body mass index is 29.65 kg/m.  Physical Exam HENT:     Head: Normocephalic.  Eyes:     Pupils: Pupils are equal, round, and reactive to light.  Cardiovascular:     Rate and Rhythm: Normal rate.     Pulses:          Femoral pulses are 1+ on the right side and 2+ on the left side.      Popliteal pulses are 0 on the right side and 0 on the left side.  Pulmonary:     Effort: Pulmonary effort is normal.  Abdominal:     General: Abdomen is flat.     Palpations: Abdomen is soft. There is no mass.  Musculoskeletal:        General: Normal range of motion.     Cervical back: Normal range of motion and neck supple.     Right lower leg: Edema present.     Left lower leg: No edema.  Skin:    Capillary Refill: Capillary refill takes 2 to 3 seconds.     Comments: Bilateral hemosiderin deposition bilateral lower extremities, wound on right lateral foot Neurological:     General: No focal deficit present.     Mental Status: He is alert.  Psychiatric:        Mood and Affect: Mood normal.   CBC    Component Value Date/Time   WBC 6.7 09/05/2021 1345   RBC 4.70 09/05/2021 1345   HGB 14.7 09/05/2021 1345   HCT 45.1 09/05/2021 1345   PLT 154 09/05/2021 1345   MCV 96.0 09/05/2021 1345   MCH 31.3 09/05/2021 1345   MCHC 32.6 09/05/2021 1345   RDW 15.0 09/05/2021 1345   LYMPHSABS 818 (L)  03/08/2021 1117   MONOABS 0.8 05/11/2019 1352   EOSABS 112 03/08/2021 1117   BASOSABS 33 03/08/2021 1117    BMET    Component Value Date/Time   NA 140 09/05/2021 1345  K 3.9 09/05/2021 1345   CL 105 09/05/2021 1345   CO2 28 09/05/2021 1345   GLUCOSE 119 (H) 09/05/2021 1345   BUN 18 09/05/2021 1345   CREATININE 0.94 09/05/2021 1345   CREATININE 0.90 03/08/2021 1117   CALCIUM 9.3 09/05/2021 1345   GFRNONAA >60 09/05/2021 1345   GFRNONAA 89 11/17/2019 1258   GFRAA 104 11/17/2019 1258    COAGS: Lab Results  Component Value Date   INR 1.1 09/14/2021   INR 2.4 (H) 09/05/2021   INR 2.6 (H) 06/05/2021     Non-Invasive Vascular Imaging:   ABI Findings:  +---------+------------------+-----+--------+--------+  Right    Rt Pressure (mmHg)IndexWaveformComment   +---------+------------------+-----+--------+--------+  Brachial 135                                      +---------+------------------+-----+--------+--------+  PTA      100               0.74 biphasic          +---------+------------------+-----+--------+--------+  DP       111               0.82 biphasic          +---------+------------------+-----+--------+--------+  Great Toe40                0.29                   +---------+------------------+-----+--------+--------+   +---------+------------------+-----+--------+-------+  Left     Lt Pressure (mmHg)IndexWaveformComment  +---------+------------------+-----+--------+-------+  Brachial 136                                     +---------+------------------+-----+--------+-------+  PTA      119               0.88 biphasic         +---------+------------------+-----+--------+-------+  DP       116               0.85 biphasic         +---------+------------------+-----+--------+-------+  Great Toe37                0.27                  +---------+------------------+-----+--------+-------+    +-------+-----------+-----------+------------+------------+  ABI/TBIToday's ABIToday's TBIPrevious ABIPrevious TBI  +-------+-----------+-----------+------------+------------+  Right  0.82       0.29       0.74        0.33          +-------+-----------+-----------+------------+------------+  Left   0.88       0.27       1.16        0.36          +-------+-----------+-----------+------------+------------+     Venous Reflux Times  +--------------+---------+------+-----------+------------+--------+  RIGHT         Reflux NoRefluxReflux TimeDiameter cmsComments                          Yes                                   +--------------+---------+------+-----------+------------+--------+  CFV  yes   >1 second                       +--------------+---------+------+-----------+------------+--------+  FV prox                 yes   >1 second                       +--------------+---------+------+-----------+------------+--------+  FV mid                  yes   >1 second                       +--------------+---------+------+-----------+------------+--------+  FV dist                 yes   >1 second                       +--------------+---------+------+-----------+------------+--------+  Popliteal               yes   >1 second                       +--------------+---------+------+-----------+------------+--------+  GSV at [facility]              yes    >500 ms     0.736              +--------------+---------+------+-----------+------------+--------+  GSV prox thigh          yes    >500 ms     0.591              +--------------+---------+------+-----------+------------+--------+  GSV mid thigh           yes    >500 ms     0.591              +--------------+---------+------+-----------+------------+--------+  GSV dist thigh          yes    >500 ms     0.546               +--------------+---------+------+-----------+------------+--------+  GSV at knee   no                           0.592              +--------------+---------+------+-----------+------------+--------+  GSV prox calf                              0.441              +--------------+---------+------+-----------+------------+--------+  GSV mid calf                               0.461              +--------------+---------+------+-----------+------------+--------+  SSV Pop Fossa no                           0.406              +--------------+---------+------+-----------+------------+--------+  SSV prox calf           yes    >500 ms     0.379              +--------------+---------+------+-----------+------------+--------+  SSV mid calf                               0.379              +--------------+---------+------+-----------+------------+--------+      Summary:  Right:  - No evidence of deep vein thrombosis seen in the right lower extremity,  from the common femoral through the popliteal veins.  - No evidence of superficial venous thrombosis in the right lower  extremity.  - Venous reflux is noted in the right common femoral vein.  - Venous reflux is noted in the right sapheno-femoral junction.  - Venous reflux is noted in the right greater saphenous vein in the thigh.  - Venous reflux is noted in the right femoral vein.  - Venous reflux is noted in the right popliteal vein.  - Venous reflux is noted in the right short saphenous vein.       ASSESSMENT/PLAN: This is a 66 y.o. male with history of invasive venous and arterial wounds.  Plan for right femoropopliteal bypass with vein today.  Gregory Scheel C. Donzetta Matters, MD [facility] [place] Office: [phone] Pager: [phone]

## 2021-09-14 NOTE — Progress Notes (Signed)
[place] for Heparin Indication: atrial fibrillation and mechanical MVR  Not on File  Patient Measurements: Height: '5\' 8"'$  (172.7 cm) Weight: 88.5 kg (195 lb) IBW/kg (Calculated) : 68.4  Heparin Dosing Weight: 86 kg  Vital Signs: Temp: 98.2 F (36.8 C) (06/16 2020) Temp Source: Oral (06/16 2020) BP: 119/60 (06/16 2020) Pulse Rate: 78 (06/16 2020)  Labs: Recent Labs    09/14/21 0538 09/14/21 2136  APTT 44*  --   LABPROT 14.0  --   INR 1.1  --   HEPARINUNFRC  --  0.47     Estimated Creatinine Clearance: 84.7 mL/min (by C-G formula based on SCr of 0.94 mg/dL).   Medical History: Past Medical History:  Diagnosis Date   Anginal pain ([place])    Cancer ([place])    skin   COPD (chronic obstructive pulmonary disease) ([place])    Coronary artery disease    Dyspnea    Dysrhythmia    afib, s/p LAA ligation/MAZE 2013; recurrent aflutter 04/2021   History of hiatal hernia    medication related in 20s   Hx of artificial heart valve replacement    Hypertension    Mitral insufficiency and aortic stenosis    mechanical AVR/MVR 2013 in [place]   Myocardial infarction ([place])    Pneumonia    as a child   PVD (peripheral vascular disease) ([place])    Rheumatic heart disease    Stroke [facility])      Assessment: 66 y.o. 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 He has occlusive SFA disease now s/p endarterectomy. Pharmacy consulted to manage heparin while PTA warfarin on hold.  PM update: Heparin level of 0.47 is therapeutic on heparin 1400 units/hr  Goal of Therapy:  Heparin level 0.3-0.7 units/ml Monitor platelets by anticoagulation protocol: Yes   Plan: Continue heparin 1400 units/hr  Follow up AM heparin level   Thank you for allowing pharmacy to be a part of this patient's care.  Cristela Felt, PharmD, BCPS Clinical Pharmacist 09/14/2021 10:15 PM

## 2021-09-14 NOTE — Anesthesia Procedure Notes (Signed)
Procedure Name: Intubation Date/Time: 09/14/2021 7:41 AM  Performed by: Claris Che, CRNAPre-anesthesia Checklist: Patient identified, Emergency Drugs available, Suction available, Patient being monitored and Timeout performed Patient Re-evaluated:Patient Re-evaluated prior to induction Oxygen Delivery Method: Circle system utilized Preoxygenation: Pre-oxygenation with 100% oxygen Induction Type: IV induction and Cricoid Pressure applied Ventilation: Mask ventilation without difficulty Laryngoscope Size: Mac and 4 Grade View: Grade I Tube type: Oral Tube size: 8.0 mm Number of attempts: 1 Airway Equipment and Method: Stylet Placement Confirmation: ETT inserted through vocal cords under direct vision, positive ETCO2 and breath sounds checked- equal and bilateral Secured at: 23 cm Tube secured with: Tape Dental Injury: Teeth and Oropharynx as per pre-operative assessment

## 2021-09-14 NOTE — Op Note (Signed)
Patient name: [name] MRN: [id number] DOB: Aug 06, 1955 Sex: male  09/14/2021 Pre-operative Diagnosis: Mixed venous and arterial ulceration right lower extremity Post-operative diagnosis:  Same Surgeon:  Eda Paschal. Donzetta Matters, MD Assistant: Leontine Locket, PA Procedure Performed: 1.  Harvest right greater saphenous vein 2.  Right common femoral endarterectomy 3.  Right common femoral to above-knee popliteal artery bypass with nonreversed, ipsilateral, translocated greater saphenous vein   Indications: 66 year old male with mixed venous and arterial ulceration of right lower extremity.  He has a large refluxing greater saphenous vein and severely diseased SFA on the right and is now indicated for harvest of the vein and bypass.  Findings: Common femoral artery actually had a thrill within it and was severely diseased in the mid segment but there was good backbleeding from the profunda and SFA.  After endarterectomy had very strong inflow and pulsatility.  The vein was very large throughout its course and was harvested to just below the knee.  Above-knee popliteal artery was somewhat thickened but free of flow-limiting stenosis.  After bypass there was very strong signal at the posterior tibial and anterior tibial at the ankle and this was graft dependent.   Procedure:  The patient was identified in the holding area and taken to the operating room where he is placed upon upper table general anesthesia was induced.  He was sterilely prepped and draped in the right lower extremity usual fashion, antibiotics were administered and a timeout was called.  The vein was marked on his skin from just below the knee to the groin.  A transverse incision was made in the groin.  We dissected down through dense inflammatory tissue and lymphatics likely from his wounds in his right lower extremity.  We identified the common femoral artery there was a thrill within this and it was heavily calcified posteriorly.  We  dissected under the inguinal ligament up to the external iliac artery divided the crossing vein where there was a very soft area for clamping.  Distally in the common femoral artery prior to the bifurcation was also softer for clamping.  Side branches were controlled with Vesseloops.  Through multiple skip incisions we then harvested the greater saphenous vein including 1 just below the knee.  When the entirety of the vein had been dissected free we tied it off distally transected it brought it up through to the saphenofemoral junction placed a side-biting clamp transected it and then oversewed the saphenofemoral junction with 5-0 Prolene suture in a running mattress fashion.  The vein was then passed off and was prepared by the PA on the back table.  Concomitantly I dissected down to the deep fascia above the knee and identified the popliteal artery and use Doppler and confirmed flow and this was soft for clamping proximally and distally.  A tunneler was then placed between the 2 incisions above the knee and the groin incision and the patient was given 10,000 units of heparin.  After 3 minutes of circulation and ACT was checked and at this time the common femoral artery was clamped distally and proximally opened longitudinally.  I performed extensive endarterectomy including up into the external iliac artery with approximately 2-1/2 cm arteriotomy.  Had very good backbleeding from the common channel of the SFA and profunda and the endarterectomy specimen there without extending it into the profunda.  I then spatulated the vein and sewed it in place nonreversed as a patch into a bypass with 5-0 Prolene suture.  Upon completion I flushed  through the bypass itself.  There was 1 area that we repaired with 6-0 Prolene suture.  I then used a valvulotome to lyse only a few valves that were competent.  I then had very strong bleeding through the graft that was pulsatile and this was doubly clipped and the vein was marked  for orientation and tunneled subfascially.  Distally I clamped the popliteal artery distally and proximally and opened longitudinally and there was good backbleeding the artery was somewhat thickened but was not stenotic there or calcified.  The leg was straightened the vein was trimmed to size spatulated and sewn in place into side with 6-0 Prolene suture.  Prior completion of this time we allowed flushing all directions and thoroughly irrigated the arteriotomy site with heparinized saline.  Upon completion there was very good pulsatility in the bypass in the popliteal artery distally.  There was a strong signal at the anterior tibial and posterior tibial at the ankle that was graft dependent.  Satisfied with this we administered 50 mg of protamine and began irrigating all of our wounds and hemostasis was obtained.  All wounds were closed in layers with Vicryl and Monocryl.  Dermabond was placed at the skin site.  He was then awakened from anesthesia having tolerated procedure well without immediate complication.  All counts were correct at completion.   Given the complexity of the case,  the assistant was necessary in order to expedite the procedure and safely perform the technical aspects of the operation.  The assistant provided traction and countertraction to assist with exposure of the artery proximally and distally.  They assisted with suture ligature of multiple branches.  Their assistance was critical in the performance of both the proximal and distal anastomosis.These skills, especially following the Prolene suture for the anastomosis, could not have been adequately performed by a scrub tech assistant.    EBL: 200cc   Jonathandavid Marlett C. Donzetta Matters, MD [facility] [place] Office: [phone] Pager: [phone]

## 2021-09-14 NOTE — Transfer of Care (Signed)
Immediate Anesthesia Transfer of Care Note  Patient: [name]  Procedure(s) Performed: RIGHT COMMON FEMORAL-ABOVE KNEE POPLITEAL ARTERY BYPASS WITH VEIN (Right: Leg Upper)  Patient Location: PACU  Anesthesia Type:General  Level of Consciousness: awake, alert , drowsy, patient cooperative and responds to stimulation  Airway & Oxygen Therapy: Patient Spontanous Breathing and Patient connected to nasal cannula oxygen  Post-op Assessment: Report given to RN, Post -op Vital signs reviewed and stable and Patient moving all extremities X 4  Post vital signs: Reviewed and stable  Last Vitals:  Vitals Value Taken Time  BP 125/63 09/14/21 1055  Temp    Pulse 65 09/14/21 1057  Resp 18 09/14/21 1057  SpO2 94 % 09/14/21 1057  Vitals shown include unvalidated device data.  Last Pain:  Vitals:   09/14/21 0606  TempSrc:   PainSc: 1       Patients Stated Pain Goal: 1 (75/64/33 2951)  Complications: No notable events documented.

## 2021-09-14 NOTE — Progress Notes (Signed)
Day of Surgery Note    Subjective:  resting comfortably   Vitals:   09/14/21 0542 09/14/21 1055  BP: 137/64 125/63  Pulse: 78 65  Resp: 18 (!) 21  Temp: 98 F (36.7 C) 98.2 F (36.8 C)  SpO2: 95% 93%    Incisions:   all incisions look fine Extremities:  brisk right PT/AT doppler signals (right PT is palpable) Lungs:  non labored    Assessment/Plan:  This is a 66 y.o. male who is s/p  Right femoral to above knee popliteal bypass with ipsilateral GSV  -pt with palpable right PT pulse and multiphasic doppler signals -pt on coumadin for mechanical MVR and afib -recommendations from coumadin clinic noted.  Will hold on re-starting coumadin and Lovenox today and start heparin gtt per pharmacy without bolus today at 1530.   -will reassess tomorrow and most likely restart coumadin tomorrow.  (At discharge, can restart Lovenox) -to 4 east later this afternoon.  Coumadin clinic recs from 09/06/2021: Day Date Notes Warfarin Dose Enoxaparin (Lovenox) Dose  AM PM  Mon 09/10/21 None None None  Tu 09/11/21 None 90 mg (0.9 mL) 90 mg (0.9 mL)  Wed 09/12/21 None 90 mg (0.9 mL) 90 mg (0.9 mL)  Th 09/13/21 None None None  Fri 09/14/21 Procedure* 10 mg None None  Sat 09/15/21 10 mg None 90 mg (0.9 mL)  Sun 09/16/21 10 mg 90 mg (0.9 mL) 90 mg (0.9 mL)  Mon 09/17/21 7 mg 90 mg (0.9 mL) 90 mg (0.9 mL)  Tu 09/18/21 7 mg 90 mg (0.9 mL) 90 mg (0.9 mL)  Wed 09/19/21 INR check TBD 90 mg (0.9 mL) TBD    Leontine Locket, PA-C 09/14/2021 11:12 AM [phone]

## 2021-09-14 NOTE — Anesthesia Procedure Notes (Signed)
Arterial Line Insertion Start/End6/16/2023 7:15 AM, 09/14/2021 7:16 AM  Patient location: Pre-op. Lidocaine 1% used for infiltration Left, radial was placed Catheter size: 20 G Hand hygiene performed  and maximum sterile barriers used   Attempts: 1 Procedure performed without using ultrasound guided technique. Following insertion, dressing applied and Biopatch. Patient tolerated the procedure well with no immediate complications.

## 2021-09-14 NOTE — Discharge Instructions (Addendum)
[facility]  Discharge instructions  Lower Extremity Bypass Surgery  Please refer to the following instruction for your post-procedure care. Your surgeon or physician assistant will discuss any changes with you.  Activity  You are encouraged to walk as much as you can. You can slowly return to normal activities during the month after your surgery. Avoid strenuous activity and heavy lifting until your doctor tells you it's OK. Avoid activities such as vacuuming or swinging a golf club. Do not drive until your doctor give the OK and you are no longer taking prescription pain medications. It is also normal to have difficulty with sleep habits, eating and bowel movement after surgery. These will go away with time.  Bathing/Showering  Shower daily after you go home. Do not soak in a bathtub, hot tub, or swim until the incision heals completely.  Incision Care  Clean your incision with mild soap and water. Shower every day. Pat the area dry with a clean towel. You do not need a bandage unless otherwise instructed. Do not apply any ointments or creams to your incision. If you have open wounds you will be instructed how to care for them or a visiting nurse may be arranged for you. If you have staples or sutures along your incision they will be removed at your post-op appointment. You may have skin glue on your incision. Do not peel it off. It will come off on its own in about one week.  Wound vac changes Monday, Wednesday and Friday by home health nurse.  Diet  Resume your normal diet. There are no special food restrictions following this procedure. A low fat/ low cholesterol diet is recommended for all patients with vascular disease. In order to heal from your surgery, it is CRITICAL to get adequate nutrition. Your body requires vitamins, minerals, and protein. Vegetables are the best source of vitamins and minerals. Vegetables also provide the perfect balance of  protein. Processed food has little nutritional value, so try to avoid this.  Medications  Resume taking all your medications unless your doctor or physician assistant tells you not to. If your incision is causing pain, you may take over-the-counter pain relievers such as acetaminophen (Tylenol). If you were prescribed a stronger pain medication, please aware these medication can cause nausea and constipation. Prevent nausea by taking the medication with a snack or meal. Avoid constipation by drinking plenty of fluids and eating foods with high amount of fiber, such as fruits, vegetables, and grains. Take Colace 100 mg (an over-the-counter stool softener) twice a day as needed for constipation.  Do not take Tylenol if you are taking prescription pain medications.  Follow Up  Our office will schedule a follow up appointment 2-3 weeks following discharge.  Please call us immediately for any of the following conditions  Severe or worsening pain in your legs or feet while at rest or while walking Increase pain, redness, warmth, or drainage (pus) from your incision site(s) Fever of 101 degree or higher The swelling in your leg with the bypass suddenly worsens and becomes more painful than when you were in the hospital If you have been instructed to feel your graft pulse then you should do so every day. If you can no longer feel this pulse, call the office immediately. Not all patients are given this instruction.  Leg swelling is common after leg bypass surgery.  The swelling should improve over a few months following surgery. To improve the swelling, you  may elevate your legs above the level of your heart while you are sitting or resting. Your surgeon or physician assistant may ask you to apply an ACE wrap or wear compression (TED) stockings to help to reduce swelling.  Reduce your risk of vascular disease  Stop smoking. If you would like help call [facility] at 1-800-QUIT-NOW 612-415-5769) or  [place] at 630-798-7987.  Manage your cholesterol Maintain a desired weight Control your diabetes weight Control your diabetes Keep your blood pressure down  If you have any questions, please call the office at [phone]

## 2021-09-14 NOTE — Progress Notes (Signed)
ANTICOAGULATION CONSULT NOTE  Pharmacy Consult for Heparin Indication: atrial fibrillation and mechanical MVR  Not on File  Patient Measurements: Height: '5\' 8"'$  (172.7 cm) Weight: 88.5 kg (195 lb) IBW/kg (Calculated) : 68.4  Heparin Dosing Weight: 86 kg  Vital Signs: Temp: 97.7 F (36.5 C) (06/16 1320) Temp Source: Oral (06/16 1320) BP: 125/62 (06/16 1320) Pulse Rate: 71 (06/16 1320)  Labs: Recent Labs    09/14/21 0538  APTT 44*  LABPROT 14.0  INR 1.1    Estimated Creatinine Clearance: 84.7 mL/min (by C-G formula based on SCr of 0.94 mg/dL).   Medical History: Past Medical History:  Diagnosis Date   Anginal pain ([place])    Cancer ([place])    skin   COPD (chronic obstructive pulmonary disease) ([place])    Coronary artery disease    Dyspnea    Dysrhythmia    afib, s/p LAA ligation/MAZE 2013; recurrent aflutter 04/2021   History of hiatal hernia    medication related in 20s   Hx of artificial heart valve replacement    Hypertension    Mitral insufficiency and aortic stenosis    mechanical AVR/MVR 2013 in [address]   Myocardial infarction ([place])    Pneumonia    as a child   PVD (peripheral vascular disease) ([place])    Rheumatic heart disease    Stroke [facility])      Assessment: 66 y.o. 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 He has occlusive SFA disease now s/p endarterectomy. Pharmacy consulted to manage heparin while PTA warfarin on hold.  Goal of Therapy:  Heparin level 0.3-0.7 units/ml Monitor platelets by anticoagulation protocol: Yes   Plan:  Start '@15'$ :30 heparin infusion 1400 units/hr, no bolus per VVS Check heparin level in 6 hours and daily while on heparin Continue to monitor H&H and platelets    Thank you for allowing pharmacy to be a part of this patient's care.  Ardyth Harps, PharmD Clinical Pharmacist

## 2021-09-15 ENCOUNTER — Inpatient Hospital Stay (HOSPITAL_COMMUNITY): Payer: Medicare Other

## 2021-09-15 ENCOUNTER — Other Ambulatory Visit: Payer: Self-pay

## 2021-09-15 ENCOUNTER — Inpatient Hospital Stay (HOSPITAL_COMMUNITY): Payer: Medicare Other | Admitting: Certified Registered"

## 2021-09-15 ENCOUNTER — Encounter (HOSPITAL_COMMUNITY): Payer: Self-pay | Admitting: Vascular Surgery

## 2021-09-15 ENCOUNTER — Encounter (HOSPITAL_COMMUNITY)
Admission: RE | Disposition: A | Discharge status: Home / Self Care | Payer: Self-pay | Source: Ambulatory Visit | Attending: Vascular Surgery

## 2021-09-15 DIAGNOSIS — I2510 Atherosclerotic heart disease of native coronary artery without angina pectoris: Secondary | ICD-10-CM | POA: Diagnosis not present

## 2021-09-15 DIAGNOSIS — I10 Essential (primary) hypertension: Secondary | ICD-10-CM | POA: Diagnosis not present

## 2021-09-15 DIAGNOSIS — I252 Old myocardial infarction: Secondary | ICD-10-CM

## 2021-09-15 DIAGNOSIS — T8119XA Other postprocedural shock, initial encounter: Secondary | ICD-10-CM

## 2021-09-15 DIAGNOSIS — I97638 Postprocedural hematoma of a circulatory system organ or structure following other circulatory system procedure: Secondary | ICD-10-CM

## 2021-09-15 HISTORY — PX: FEMORAL ARTERY EXPLORATION: SHX5160

## 2021-09-15 HISTORY — PX: LOWER EXTREMITY ANGIOGRAM: SHX5508

## 2021-09-15 HISTORY — PX: APPLICATION OF WOUND VAC: SHX5189

## 2021-09-15 LAB — COMPREHENSIVE METABOLIC PANEL
ALT: 11 U/L (ref 0–44)
AST: 12 U/L — ABNORMAL LOW (ref 15–41)
Albumin: 2.2 g/dL — ABNORMAL LOW (ref 3.5–5.0)
Alkaline Phosphatase: 46 U/L (ref 38–126)
Anion gap: 5 (ref 5–15)
BUN: 19 mg/dL (ref 8–23)
CO2: 23 mmol/L (ref 22–32)
Calcium: 6.8 mg/dL — ABNORMAL LOW (ref 8.9–10.3)
Chloride: 109 mmol/L (ref 98–111)
Creatinine, Ser: 0.99 mg/dL (ref 0.61–1.24)
GFR, Estimated: >60 mL/min (ref >60)
Glucose, Bld: 170 mg/dL — ABNORMAL HIGH (ref 70–99)
Potassium: 4.4 mmol/L (ref 3.5–5.1)
Sodium: 137 mmol/L (ref 135–145)
Total Bilirubin: 0.4 mg/dL (ref 0.3–1.2)
Total Protein: 3.7 g/dL — ABNORMAL LOW (ref 6.5–8.1)

## 2021-09-15 LAB — LIPID PANEL
Cholesterol: 103 mg/dL (ref 0–200)
HDL: 43 mg/dL (ref >40)
LDL Cholesterol: 52 mg/dL (ref 0–99)
Total CHOL/HDL Ratio: 2.4 RATIO
Triglycerides: 42 mg/dL (ref <150)
VLDL: 8 mg/dL (ref 0–40)

## 2021-09-15 LAB — BASIC METABOLIC PANEL
Anion gap: 7 (ref 5–15)
BUN: 16 mg/dL (ref 8–23)
CO2: 27 mmol/L (ref 22–32)
Calcium: 8.2 mg/dL — ABNORMAL LOW (ref 8.9–10.3)
Chloride: 101 mmol/L (ref 98–111)
Creatinine, Ser: 1.12 mg/dL (ref 0.61–1.24)
GFR, Estimated: >60 mL/min (ref >60)
Glucose, Bld: 221 mg/dL — ABNORMAL HIGH (ref 70–99)
Potassium: 4.2 mmol/L (ref 3.5–5.1)
Sodium: 135 mmol/L (ref 135–145)

## 2021-09-15 LAB — GLUCOSE, CAPILLARY
Glucose-Capillary: 132 mg/dL — ABNORMAL HIGH (ref 70–99)
Glucose-Capillary: 137 mg/dL — ABNORMAL HIGH (ref 70–99)
Glucose-Capillary: 169 mg/dL — ABNORMAL HIGH (ref 70–99)

## 2021-09-15 LAB — CBC
HCT: 37 % — ABNORMAL LOW (ref 39.0–52.0)
HCT: 30.5 % — ABNORMAL LOW (ref 39.0–52.0)
Hemoglobin: 11.7 g/dL — ABNORMAL LOW (ref 13.0–17.0)
Hemoglobin: 9.9 g/dL — ABNORMAL LOW (ref 13.0–17.0)
MCH: 30.5 pg (ref 26.0–34.0)
MCH: 31.1 pg (ref 26.0–34.0)
MCHC: 31.6 g/dL (ref 30.0–36.0)
MCHC: 32.5 g/dL (ref 30.0–36.0)
MCV: 96.6 fL (ref 80.0–100.0)
MCV: 95.9 fL (ref 80.0–100.0)
Platelets: 125 10*3/uL — ABNORMAL LOW (ref 150–400)
Platelets: 140 10*3/uL — ABNORMAL LOW (ref 150–400)
RBC: 3.83 MIL/uL — ABNORMAL LOW (ref 4.22–5.81)
RBC: 3.18 MIL/uL — ABNORMAL LOW (ref 4.22–5.81)
RDW: 14.7 % (ref 11.5–15.5)
RDW: 14.9 % (ref 11.5–15.5)
WBC: 9.6 10*3/uL (ref 4.0–10.5)
WBC: 15.1 10*3/uL — ABNORMAL HIGH (ref 4.0–10.5)
nRBC: 0 % (ref 0.0–0.2)
nRBC: 0 % (ref 0.0–0.2)

## 2021-09-15 LAB — PROTIME-INR
INR: 1.2 (ref 0.8–1.2)
Prothrombin Time: 15 seconds (ref 11.4–15.2)

## 2021-09-15 LAB — TROPONIN I (HIGH SENSITIVITY)
Troponin I (High Sensitivity): 5 ng/L (ref <18)
Troponin I (High Sensitivity): 5 ng/L (ref <18)

## 2021-09-15 LAB — HEPARIN LEVEL (UNFRACTIONATED)
Heparin Unfractionated: 0.71 IU/mL — ABNORMAL HIGH (ref 0.30–0.70)
Heparin Unfractionated: 0.54 IU/mL (ref 0.30–0.70)

## 2021-09-15 LAB — PREPARE RBC (CROSSMATCH)

## 2021-09-15 SURGERY — EXPLORATION, ARTERY, FEMORAL
Anesthesia: General | Laterality: Right

## 2021-09-15 MED ORDER — ROCURONIUM BROMIDE 10 MG/ML (PF) SYRINGE
PREFILLED_SYRINGE | INTRAVENOUS | Status: AC
  Filled 2021-09-15: qty 10

## 2021-09-15 MED ORDER — SODIUM CHLORIDE 0.9 % IV SOLN
250.0000 mL | INTRAVENOUS | Status: DC
  Administered 2021-09-18: 250 mL via INTRAVENOUS

## 2021-09-15 MED ORDER — EPHEDRINE 5 MG/ML INJ
INTRAVENOUS | Status: AC
  Filled 2021-09-15: qty 5

## 2021-09-15 MED ORDER — WARFARIN SODIUM 10 MG PO TABS
10.0000 mg | ORAL_TABLET | Freq: Every day | ORAL | Status: DC
  Administered 2021-09-15: 10 mg via ORAL
  Filled 2021-09-15: qty 1

## 2021-09-15 MED ORDER — SUGAMMADEX SODIUM 200 MG/2ML IV SOLN
INTRAVENOUS | Status: DC | PRN
  Administered 2021-09-15 (×2): 100 mg via INTRAVENOUS

## 2021-09-15 MED ORDER — ACETAMINOPHEN 160 MG/5ML PO SOLN: 1000.0000 mg | Freq: Once | ORAL | Status: DC | PRN

## 2021-09-15 MED ORDER — DEXAMETHASONE SODIUM PHOSPHATE 10 MG/ML IJ SOLN
INTRAMUSCULAR | Status: AC
  Filled 2021-09-15: qty 1

## 2021-09-15 MED ORDER — EPHEDRINE SULFATE-NACL 50-0.9 MG/10ML-% IV SOSY
PREFILLED_SYRINGE | INTRAVENOUS | Status: DC | PRN
  Administered 2021-09-15 (×2): 10 mg via INTRAVENOUS

## 2021-09-15 MED ORDER — OXYCODONE HCL 5 MG/5ML PO SOLN: 5.0000 mg | Freq: Once | ORAL | Status: DC | PRN

## 2021-09-15 MED ORDER — NOREPINEPHRINE 4 MG/250ML-% IV SOLN
2.0000 ug/min | INTRAVENOUS | Status: DC
  Administered 2021-09-15: 2 ug/min via INTRAVENOUS
  Filled 2021-09-15: qty 250

## 2021-09-15 MED ORDER — OXYCODONE HCL 5 MG PO TABS: 5.0000 mg | ORAL_TABLET | Freq: Once | ORAL | Status: DC | PRN

## 2021-09-15 MED ORDER — ALBUMIN HUMAN 25 % IV SOLN
INTRAVENOUS | Status: AC
  Filled 2021-09-15: qty 50

## 2021-09-15 MED ORDER — ALBUMIN HUMAN 25 % IV SOLN
25.0000 g | Freq: Once | INTRAVENOUS | Status: AC
  Administered 2021-09-15: 25 g via INTRAVENOUS

## 2021-09-15 MED ORDER — PHENYLEPHRINE 80 MCG/ML (10ML) SYRINGE FOR IV PUSH (FOR BLOOD PRESSURE SUPPORT)
PREFILLED_SYRINGE | INTRAVENOUS | Status: DC | PRN
  Administered 2021-09-15: 240 ug via INTRAVENOUS

## 2021-09-15 MED ORDER — NOREPINEPHRINE 4 MG/250ML-% IV SOLN: 2.0000 ug/min | INTRAVENOUS | Status: DC

## 2021-09-15 MED ORDER — ONDANSETRON HCL 4 MG/2ML IJ SOLN
INTRAMUSCULAR | Status: DC | PRN
  Administered 2021-09-15: 4 mg via INTRAVENOUS

## 2021-09-15 MED ORDER — SODIUM CHLORIDE 0.9 % IV BOLUS: 500.0000 mL | Freq: Once | INTRAVENOUS | Status: DC | PRN

## 2021-09-15 MED ORDER — ROCURONIUM BROMIDE 10 MG/ML (PF) SYRINGE
PREFILLED_SYRINGE | INTRAVENOUS | Status: DC | PRN
  Administered 2021-09-15: 60 mg via INTRAVENOUS

## 2021-09-15 MED ORDER — PROPOFOL 10 MG/ML IV BOLUS
INTRAVENOUS | Status: DC | PRN
  Administered 2021-09-15: 160 mg via INTRAVENOUS

## 2021-09-15 MED ORDER — FENTANYL CITRATE (PF) 100 MCG/2ML IJ SOLN: 25.0000 ug | INTRAMUSCULAR | Status: DC | PRN

## 2021-09-15 MED ORDER — WARFARIN - PHYSICIAN DOSING INPATIENT: Freq: Every day | Status: DC

## 2021-09-15 MED ORDER — DIPHENHYDRAMINE HCL 12.5 MG/5ML PO ELIX: 12.5000 mg | ORAL_SOLUTION | Freq: Four times a day (QID) | ORAL | Status: DC | PRN

## 2021-09-15 MED ORDER — FENTANYL CITRATE (PF) 100 MCG/2ML IJ SOLN
INTRAMUSCULAR | Status: DC | PRN
  Administered 2021-09-15: 50 ug via INTRAVENOUS

## 2021-09-15 MED ORDER — CHLORHEXIDINE GLUCONATE CLOTH 2 % EX PADS
6.0000 | MEDICATED_PAD | Freq: Every day | CUTANEOUS | Status: DC
  Administered 2021-09-15 – 2021-09-25 (×11): 6 via TOPICAL

## 2021-09-15 MED ORDER — LIDOCAINE 2% (20 MG/ML) 5 ML SYRINGE
INTRAMUSCULAR | Status: DC | PRN
  Administered 2021-09-15: 60 mg via INTRAVENOUS

## 2021-09-15 MED ORDER — IODIXANOL 320 MG/ML IV SOLN
INTRAVENOUS | Status: DC | PRN
  Administered 2021-09-15: 30 mL

## 2021-09-15 MED ORDER — ONDANSETRON HCL 4 MG/2ML IJ SOLN
INTRAMUSCULAR | Status: AC
  Filled 2021-09-15: qty 2

## 2021-09-15 MED ORDER — PHENYLEPHRINE 80 MCG/ML (10ML) SYRINGE FOR IV PUSH (FOR BLOOD PRESSURE SUPPORT)
PREFILLED_SYRINGE | INTRAVENOUS | Status: AC
  Filled 2021-09-15: qty 10

## 2021-09-15 MED ORDER — LIDOCAINE 2% (20 MG/ML) 5 ML SYRINGE
INTRAMUSCULAR | Status: AC
  Filled 2021-09-15: qty 5

## 2021-09-15 MED ORDER — HEPARIN 6000 UNIT IRRIGATION SOLUTION
Status: AC
  Filled 2021-09-15: qty 500

## 2021-09-15 MED ORDER — MIDAZOLAM HCL 2 MG/2ML IJ SOLN
INTRAMUSCULAR | Status: DC | PRN
  Administered 2021-09-15: 2 mg via INTRAVENOUS

## 2021-09-15 MED ORDER — PROPOFOL 10 MG/ML IV BOLUS
INTRAVENOUS | Status: AC
  Filled 2021-09-15: qty 20

## 2021-09-15 MED ORDER — SODIUM CHLORIDE 0.9% FLUSH: 9.0000 mL | INTRAVENOUS | Status: DC | PRN

## 2021-09-15 MED ORDER — DIPHENHYDRAMINE HCL 50 MG/ML IJ SOLN: 12.5000 mg | Freq: Four times a day (QID) | INTRAMUSCULAR | Status: DC | PRN

## 2021-09-15 MED ORDER — NALOXONE HCL 0.4 MG/ML IJ SOLN: 0.4000 mg | INTRAMUSCULAR | Status: DC | PRN

## 2021-09-15 MED ORDER — WARFARIN - PHARMACIST DOSING INPATIENT: Freq: Every day | Status: DC

## 2021-09-15 MED ORDER — MIDAZOLAM HCL 2 MG/2ML IJ SOLN
INTRAMUSCULAR | Status: AC
  Filled 2021-09-15: qty 2

## 2021-09-15 MED ORDER — ACETAMINOPHEN 10 MG/ML IV SOLN: 1000.0000 mg | Freq: Once | INTRAVENOUS | Status: DC | PRN

## 2021-09-15 MED ORDER — SUCCINYLCHOLINE CHLORIDE 200 MG/10ML IV SOSY
PREFILLED_SYRINGE | INTRAVENOUS | Status: DC | PRN
  Administered 2021-09-15: 140 mg via INTRAVENOUS

## 2021-09-15 MED ORDER — 0.9 % SODIUM CHLORIDE (POUR BTL) OPTIME
TOPICAL | Status: DC | PRN
  Administered 2021-09-15: 2000 mL

## 2021-09-15 MED ORDER — FENTANYL CITRATE (PF) 250 MCG/5ML IJ SOLN
INTRAMUSCULAR | Status: AC
  Filled 2021-09-15: qty 5

## 2021-09-15 MED ORDER — ACETAMINOPHEN 500 MG PO TABS: 1000.0000 mg | ORAL_TABLET | Freq: Once | ORAL | Status: DC | PRN

## 2021-09-15 MED ORDER — SODIUM CHLORIDE 0.9 % IV SOLN: INTRAVENOUS | Status: DC | PRN

## 2021-09-15 MED ORDER — HEPARIN 6000 UNIT IRRIGATION SOLUTION
Status: DC | PRN
  Administered 2021-09-15: 1

## 2021-09-15 MED ORDER — HYDROMORPHONE 1 MG/ML IV SOLN
INTRAVENOUS | Status: DC
  Administered 2021-09-16: 0.9 mg via INTRAVENOUS
  Administered 2021-09-17: 0.3 mg via INTRAVENOUS
  Filled 2021-09-15: qty 30

## 2021-09-15 MED ORDER — CEFAZOLIN SODIUM-DEXTROSE 2-3 GM-%(50ML) IV SOLR
INTRAVENOUS | Status: DC | PRN
  Administered 2021-09-15: 2 g via INTRAVENOUS

## 2021-09-15 SURGICAL SUPPLY — 56 items
BAG COUNTER SPONGE SURGICOUNT (BAG) ×2 IMPLANT
BAG SPNG CNTER NS LX DISP (BAG) ×1
BNDG COHESIVE 6X5 TAN NS LF (GAUZE/BANDAGES/DRESSINGS) ×1 IMPLANT
BNDG GAUZE DERMACEA FLUFF (GAUZE/BANDAGES/DRESSINGS) ×2
BNDG GAUZE DERMACEA FLUFF 4 (GAUZE/BANDAGES/DRESSINGS) IMPLANT
BNDG GZE DERMACEA 4 6PLY (GAUZE/BANDAGES/DRESSINGS) ×2
CANISTER SUCT 3000ML PPV (MISCELLANEOUS) ×2 IMPLANT
CANISTER WOUND CARE 500ML ATS (WOUND CARE) ×1 IMPLANT
CANNULA VESSEL 3MM 2 BLNT TIP (CANNULA) ×2 IMPLANT
CATH EMB 4FR 80CM (CATHETERS) ×1 IMPLANT
COVER PROBE W GEL 5X96 (DRAPES) ×1 IMPLANT
COVER SURGICAL LIGHT HANDLE (MISCELLANEOUS) ×1 IMPLANT
DRAIN CHANNEL 15F RND FF W/TCR (WOUND CARE) IMPLANT
DRAIN JP 15F RND TROCAR (DRAIN) ×1 IMPLANT
DRAPE C-ARM 42X72 X-RAY (DRAPES) ×1 IMPLANT
DRESSING PREVENA PLUS CUSTOM (GAUZE/BANDAGES/DRESSINGS) IMPLANT
DRSG PREVENA PLUS CUSTOM (GAUZE/BANDAGES/DRESSINGS) ×2
ELECT REM PT RETURN 9FT ADLT (ELECTROSURGICAL) ×2
ELECTRODE REM PT RTRN 9FT ADLT (ELECTROSURGICAL) ×1 IMPLANT
EVACUATOR SILICONE 100CC (DRAIN) ×1 IMPLANT
GAUZE SPONGE 4X4 12PLY STRL (GAUZE/BANDAGES/DRESSINGS) ×2 IMPLANT
GLOVE SURG SS PI 8.0 STRL IVOR (GLOVE) ×2 IMPLANT
GOWN STRL REUS W/ TWL LRG LVL3 (GOWN DISPOSABLE) ×2 IMPLANT
GOWN STRL REUS W/ TWL XL LVL3 (GOWN DISPOSABLE) ×1 IMPLANT
GOWN STRL REUS W/TWL LRG LVL3 (GOWN DISPOSABLE) ×4
GOWN STRL REUS W/TWL XL LVL3 (GOWN DISPOSABLE) ×2
INSERT FOGARTY SM (MISCELLANEOUS) IMPLANT
KIT BASIN OR (CUSTOM PROCEDURE TRAY) ×2 IMPLANT
KIT TURNOVER KIT B (KITS) ×2 IMPLANT
NS IRRIG 1000ML POUR BTL (IV SOLUTION) ×4 IMPLANT
PACK PERIPHERAL VASCULAR (CUSTOM PROCEDURE TRAY) ×2 IMPLANT
PAD ARMBOARD 7.5X6 YLW CONV (MISCELLANEOUS) ×4 IMPLANT
SET MICROPUNCTURE 5F STIFF (MISCELLANEOUS) ×1 IMPLANT
SET WALTER ACTIVATION W/DRAPE (SET/KITS/TRAYS/PACK) IMPLANT
SPONGE SURGIFOAM ABS GEL 100 (HEMOSTASIS) IMPLANT
SPONGE T-LAP 18X18 ~~LOC~~+RFID (SPONGE) ×3 IMPLANT
STAPLER VISISTAT 35W (STAPLE) ×2 IMPLANT
STOPCOCK 4 WAY LG BORE MALE ST (IV SETS) ×1 IMPLANT
SUT ETHILON 2 0 PSLX (SUTURE) ×5 IMPLANT
SUT ETHILON 3 0 PS 1 (SUTURE) ×1 IMPLANT
SUT MNCRL AB 4-0 PS2 18 (SUTURE) ×4 IMPLANT
SUT PROLENE 5 0 C 1 24 (SUTURE) ×3 IMPLANT
SUT PROLENE 6 0 BV (SUTURE) ×3 IMPLANT
SUT SILK 2 0 PERMA HAND 18 BK (SUTURE) IMPLANT
SUT SILK 2 0 SH (SUTURE) ×2 IMPLANT
SUT VIC AB 2-0 CT1 18 (SUTURE) ×1 IMPLANT
SUT VIC AB 2-0 CT1 27 (SUTURE) ×10
SUT VIC AB 2-0 CT1 TAPERPNT 27 (SUTURE) ×2 IMPLANT
SUT VIC AB 3-0 SH 27 (SUTURE) ×6
SUT VIC AB 3-0 SH 27X BRD (SUTURE) ×2 IMPLANT
SYR 20ML LL LF (SYRINGE) ×2 IMPLANT
TOWEL GREEN STERILE (TOWEL DISPOSABLE) ×2 IMPLANT
TRAY FOLEY MTR SLVR 16FR STAT (SET/KITS/TRAYS/PACK) ×2 IMPLANT
TUBING EXTENTION W/L.L. (IV SETS) ×1 IMPLANT
UNDERPAD 30X36 HEAVY ABSORB (UNDERPADS AND DIAPERS) ×2 IMPLANT
WATER STERILE IRR 1000ML POUR (IV SOLUTION) ×2 IMPLANT

## 2021-09-15 NOTE — Progress Notes (Signed)
PHARMACIST LIPID MONITORING   [name] is a 66 y.o. 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  Pharmacy has been consulted to optimize lipid-lowering therapy with the indication of secondary prevention for clinical ASCVD.  Recent Labs:  Lipid Panel (last 6 months):   Lab Results  Component Value Date   CHOL 103 09/15/2021   TRIG 42 09/15/2021   HDL 43 09/15/2021   CHOLHDL 2.4 09/15/2021   VLDL 8 09/15/2021   LDLCALC 52 09/15/2021    Hepatic function panel (last 6 months):   Lab Results  Component Value Date   AST 22 09/05/2021   ALT 19 09/05/2021   ALKPHOS 90 09/05/2021   BILITOT 0.6 09/05/2021    SCr (since admission):   Serum creatinine: 1.12 mg/dL 09/15/21 0049 Estimated creatinine clearance: 71.1 mL/min  Current therapy and lipid therapy tolerance Current lipid-lowering therapy: Atorvastatin 80 mg daily Previous lipid-lowering therapies (if applicable): n/a Documented or reported allergies or intolerances to lipid-lowering therapies (if applicable): none  Assessment:   Patient prefers no changes in lipid-lowering therapy at this time due to LDL of <55  Plan:    1.Statin intensity (high intensity recommended for all patients regardless of the LDL):  No statin changes. The patient is already on a high intensity statin.  2.Add ezetimibe (if any one of the following):   Not indicated at this time.  3.Refer to lipid clinic:   No  4.Follow-up with:  Primary care provider - [name], MD  5.Follow-up labs after discharge:  No changes in lipid therapy, repeat a lipid panel in one year.     Thank you for allowing pharmacy to participate in this patient's care.  Reatha Harps, PharmD PGY1 Pharmacy Resident 09/15/2021 7:35 AM Check [email or web address] for unit specific pharmacy number

## 2021-09-15 NOTE — Progress Notes (Signed)
OT Cancellation Note  Patient Details Name: [name] MRN: [id number] DOB: October 29, 1955   Cancelled Treatment:    Reason Eval/Treat Not Completed: Patient not medically ready;Other (comment) (Per RN, pt's BP was very low and not feeling well. Pt required x3 people assist to get back to bed from recliner. OT hold for today.)  Jefferey Pica, OTR/L Acute Rehabilitation Services Office: [place] 09/15/2021, 11:53 AM

## 2021-09-15 NOTE — Progress Notes (Addendum)
Progress Note    09/15/2021 7:38 AM 1 Day Post-Op  Subjective:  says for the first time, he has some feeling in the right foot and it felt ticklish   Afebrile HR 70's-80's 161'W-960'A systolic 54% [id number]  Vitals:   09/14/21 2354 09/15/21 0311  BP: (!) 118/57 (!) 126/59  Pulse: 77 73  Resp: 20 15  Temp: 98.3 F (36.8 C) 98.1 F (36.7 C)  SpO2: 96% 97%    Physical Exam: Cardiac:  regular Lungs:  Non labored Incisions:  scant bloody drainage from above knee incision distally otherwise, incisions look good Extremities:  right foot is warm with brisk right PT/DP doppler signals   CBC    Component Value Date/Time   WBC 9.6 09/15/2021 0049   RBC 3.83 (L) 09/15/2021 0049   HGB 11.7 (L) 09/15/2021 0049   HCT 37.0 (L) 09/15/2021 0049   PLT 125 (L) 09/15/2021 0049   MCV 96.6 09/15/2021 0049   MCH 30.5 09/15/2021 0049   MCHC 31.6 09/15/2021 0049   RDW 14.7 09/15/2021 0049   LYMPHSABS 818 (L) 03/08/2021 1117   MONOABS 0.8 05/11/2019 1352   EOSABS 112 03/08/2021 1117   BASOSABS 33 03/08/2021 1117    BMET    Component Value Date/Time   NA 135 09/15/2021 0049   K 4.2 09/15/2021 0049   CL 101 09/15/2021 0049   CO2 27 09/15/2021 0049   GLUCOSE 221 (H) 09/15/2021 0049   BUN 16 09/15/2021 0049   CREATININE 1.12 09/15/2021 0049   CREATININE 0.90 03/08/2021 1117   CALCIUM 8.2 (L) 09/15/2021 0049   GFRNONAA >60 09/15/2021 0049   GFRNONAA 89 11/17/2019 1258   GFRAA 104 11/17/2019 1258    INR    Component Value Date/Time   INR 1.2 09/15/2021 0049     Intake/Output Summary (Last 24 hours) at 09/15/2021 0738 Last data filed at 09/15/2021 0342 Gross per 24 hour  Intake 2682.03 ml  Output 435 ml  Net 2247.03 ml     Assessment/Plan:  66 y.o. male is s/p:  Right femoral to above knee popliteal bypass with non reversed ipsilateral GSV  1 Day Post-Op   -pt doing well this am with with brisk right PT/DP doppler signal -acute blood loss anemia-pt is  tolerating --increase mobilization today -DVT prophylaxis:  heparin gtt for mechanical MVR and afib-will start his coumadin dosing back today ('10mg'$  today) per recommendations of his pharmacist that manages his coumadin.  Will start Lovenox closer to discharge   Coumadin clinic recs from 09/06/2021: Day Date Notes Warfarin Dose Enoxaparin (Lovenox) Dose  AM PM  Mon 09/10/21 None None None  Tu 09/11/21 None 90 mg (0.9 mL) 90 mg (0.9 mL)  Wed 09/12/21 None 90 mg (0.9 mL) 90 mg (0.9 mL)  Th 09/13/21 None None None  Fri 09/14/21 Procedure* 10 mg None None  Sat 09/15/21 10 mg None 90 mg (0.9 mL)  Sun 09/16/21 10 mg 90 mg (0.9 mL) 90 mg (0.9 mL)  Mon 09/17/21 7 mg 90 mg (0.9 mL) 90 mg (0.9 mL)  Tu 09/18/21 7 mg 90 mg (0.9 mL) 90 mg (0.9 mL)  Wed 09/19/21 INR check TBD 90 mg (0.9 mL) TBD      Leontine Locket, PA-C [facility] [phone] 09/15/2021 7:38 AM  VASCULAR STAFF ADDENDUM: I have independently interviewed and examined the patient. I agree with the above.  Patient had sudden event today after my initial rounds. He became minimally responsive with some posturing / ?  Seizure activity for bedside nurses. On my evaluation the patient is at baseline level of consciousness. He is somnolent. He is very hypotensive (~60s/40s). No focal signs on neurologic exam. His thigh is quite swollen. There is some ecchymosis. I do not appreciate frank bleeding or hematoma. We are bolusing the patient with IV fluids.  Will check EKG / troponin. Repeat CBC / BMP. Will re-evaluate serially over the day.  Yevonne Aline. Stanford Breed, MD [facility] Phone Number: [phone] 09/15/2021 11:48 AM

## 2021-09-15 NOTE — Anesthesia Procedure Notes (Signed)
Arterial Line Insertion Start/End6/17/2023 2:50 PM, 09/15/2021 2:58 PM  Patient location: OR. Preanesthetic checklist: patient identified, IV checked, site marked, risks and benefits discussed, surgical consent, monitors and equipment checked, pre-op evaluation, timeout performed and anesthesia consent Emergency situation Right, radial was placed Catheter size: 20 G Hand hygiene performed  and maximum sterile barriers used   Attempts: 1 Procedure performed without using ultrasound guided technique. Following insertion, dressing applied. Post procedure assessment: normal and unchanged  Patient tolerated the procedure well with no immediate complications.

## 2021-09-15 NOTE — Progress Notes (Addendum)
ANTICOAGULATION CONSULT NOTE  Pharmacy Consult for Heparin Indication: atrial fibrillation and mechanical MVR  Not on File  Patient Measurements: Height: '5\' 8"'$  (172.7 cm) Weight: 88.5 kg (195 lb) IBW/kg (Calculated) : 68.4  Heparin Dosing Weight: 86 kg  Vital Signs: Temp: 98.1 F (36.7 C) (06/17 0311) Temp Source: Oral (06/17 0311) BP: 126/59 (06/17 0311) Pulse Rate: 73 (06/17 0311)  Labs: Recent Labs    09/14/21 0538 09/14/21 2136 09/15/21 0049  HGB  --   --  11.7*  HCT  --   --  37.0*  PLT  --   --  125*  APTT 44*  --   --   LABPROT 14.0  --  15.0  INR 1.1  --  1.2  HEPARINUNFRC  --  0.47 0.71*  CREATININE  --   --  1.12     Estimated Creatinine Clearance: 71.1 mL/min (by C-G formula based on SCr of 1.12 mg/dL).   Medical History: Past Medical History:  Diagnosis Date   Anginal pain ([place])    Cancer ([place])    skin   COPD (chronic obstructive pulmonary disease) ([place])    Coronary artery disease    Dyspnea    Dysrhythmia    afib, s/p LAA ligation/MAZE 2013; recurrent aflutter 04/2021   History of hiatal hernia    medication related in 20s   Hx of artificial heart valve replacement    Hypertension    Mitral insufficiency and aortic stenosis    mechanical AVR/MVR 2013 in [place]   Myocardial infarction ([place])    Pneumonia    as a child   PVD (peripheral vascular disease) ([place])    Rheumatic heart disease    Stroke [facility])      Assessment: 66 y.o. 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 He has occlusive SFA disease now s/p endarterectomy. Pharmacy consulted to manage heparin while PTA warfarin on hold.  PM update: Heparin level of 0.71 is supratherapeutic on heparin 1400 units/hr. Plts are low at 125 and Hg has declined to 11.7.  Goal of Therapy:  Heparin level 0.3-0.7 units/ml Monitor platelets by anticoagulation protocol: Yes   Plan: Decrease heparin to 1300 units/hr  6 hour heparin level Daily heparin level and  CBC   Thank you for allowing pharmacy to participate in this patient's care.  Reatha Harps, PharmD PGY1 Pharmacy Resident 09/15/2021 7:30 AM Check [email or web address] for unit specific pharmacy number  ADDENDUM:  PA scheduled warfarin 10 mg daily. Will follow INR trends and plans for lovenox bridge

## 2021-09-15 NOTE — Anesthesia Preprocedure Evaluation (Signed)
Anesthesia Evaluation  Patient identified by MRN, date of birth, ID band Patient confused    Reviewed: Allergy & Precautions, NPO status , Patient's Chart, lab work & pertinent test results, Unable to perform ROS - Chart review only  History of Anesthesia Complications Negative for: history of anesthetic complications  Airway Mallampati: Unable to assess       Dental   Pulmonary shortness of breath, COPD, Current Smoker and Patient abstained from smoking.,    breath sounds clear to auscultation       Cardiovascular hypertension, + CAD, + Past MI, + Cardiac Stents and + Peripheral Vascular Disease  + dysrhythmias + Valvular Problems/Murmurs  Rhythm:Irregular  SUMMARY  The left ventricular size is normal.  Mild left ventricular hypertrophy.  Left ventricular systolic function is normal.  LV ejection fraction = 60-65%.  The right ventricle is moderately dilated.  The left atrium is moderately to severely dilated.  There is a mechanical aortic valve.  There is trace aortic regurgitation.  There is a mechanical mitral valve.  The mean gradient across the mitral valve is 5 mmHg.  The heart rate for the mean mitral valve gradient is 80 BPM.  There is no mitral regurgitation noted.  The ascending aorta is normal size.  IVC size was normal.  There is no pericardial effusion.  Difficult to compare to the prior study due to different image  quality.      Neuro/Psych CVA    GI/Hepatic Neg liver ROS, hiatal hernia,   Endo/Other  negative endocrine ROS  Renal/GU Lab Results      Component                Value               Date                      CREATININE               1.12                09/15/2021                Musculoskeletal negative musculoskeletal ROS (+)   Abdominal   Peds  Hematology  (+) Blood dyscrasia, anemia ,   Anesthesia Other Findings   Reproductive/Obstetrics                              Anesthesia Physical Anesthesia Plan  ASA: 4 and emergent  Anesthesia Plan: General   Post-op Pain Management:    Induction: Intravenous, Rapid sequence and Cricoid pressure planned  PONV Risk Score and Plan: 1 and Ondansetron and Dexamethasone  Airway Management Planned: Oral ETT  Additional Equipment: Arterial line  Intra-op Plan:   Post-operative Plan: Extubation in OR  Informed Consent: I have reviewed the patients History and Physical, chart, labs and discussed the procedure including the risks, benefits and alternatives for the proposed anesthesia with the patient or authorized representative who has indicated his/her understanding and acceptance.     Dental advisory given  Plan Discussed with: CRNA  Anesthesia Plan Comments:         Anesthesia Quick Evaluation

## 2021-09-15 NOTE — Progress Notes (Signed)
Verbal order by Dr. Luan Pulling to hold Heparin gtt tonight due to recent surgery, that heparin gtt will likely be restarted tomorrow morning 09/16/21.

## 2021-09-15 NOTE — Op Note (Signed)
DATE OF SERVICE: 09/15/2021  PATIENT:  [name]  66 y.o. male  PRE-OPERATIVE DIAGNOSIS:  hemorrhagic shock after right femoral-popliteal bypass  POST-OPERATIVE DIAGNOSIS:  Same  PROCEDURE:   1) exploration of right femoral-popliteal bypass 2) angiogram of right lower extremity 3) application of wound vac  SURGEON:  Surgeon(s) and Role:    * Cherre Robins, MD - Primary  ASSISTANT: Leontine Locket, PA-C  An experienced assistant was required given the complexity of this procedure and the standard of surgical care. My assistant helped with exposure through counter tension, suctioning, ligation and retraction to better visualize the surgical field.  My assistant expedited sewing during the case by following my sutures. Wherever I use the term "we" in the report, my assistant actively helped me with that portion of the procedure.  ANESTHESIA:   general  EBL: 63m  BLOOD ADMINISTERED:none  DRAINS: none   LOCAL MEDICATIONS USED:  NONE  SPECIMEN:  none  COUNTS: confirmed correct.  TOURNIQUET:  none  PATIENT DISPOSITION:  PACU - hemodynamically stable.   Delay start of Pharmacological VTE agent (>24hrs) due to surgical blood loss or risk of bleeding: no  INDICATION FOR PROCEDURE: RDraxton Luuis a 66y.o. male with profound hypotension (~642mg) after right femoral-popliteal bypass. He transiently responded to crystalloid. His thigh was quite tight. I became concerned the patient was in hemorrhagic shock and was taken emergently to the operating room. I discussed my concerns with the wife who was in agreement.  OPERATIVE FINDINGS: large amount of clot and frank blood encountered upon re-opening incisions.  I inspected both anastomoses and found no active bleeding.  There was significant blood noted to the fascia and so I connected all the incisions with a 10 blade.  Open the fascia and inspected the more distal bypass segment.  No active bleeding was noted from the tunnel.  I  was not able to identify a clear Doppler signal in his foot.  On table angiography was performed.  This confirmed no extravasation of contrast to the bypass and good flow into the foot through the bypass.  DESCRIPTION OF PROCEDURE: After identification of the patient in the pre-operative holding area, the patient was transferred to the operating room. The patient was positioned supine on the operating room table. Anesthesia was induced. The right leg was prepped and draped in standard fashion. A surgical pause was performed confirming correct patient, procedure, and operative location.  Previously made incisions were reopened sharply.  A large amount of blood was encountered and all of the wound beds.  Blood and hematoma were evacuated.  The proximal and distal anastomosis were interrogated.  No active bleeding was noted.  The thigh remained tense.  The fascia of the distal thigh appeared stained with hematoma.  Concerned he may be bleeding from his tunnel tract, I connected all the incisions sharply with a 10 blade.  I opened the subsartorial fascia to expose the distal third of the bypass.  No frank bleeding was identified.  More hematoma and bleeding was evacuated.  Good flow was noted through the bypass.  I was not able to appreciate a Doppler signal at the foot.  A C-arm was brought into the room.  The proximal bypass was accessed with micropuncture technique.  Angiography was performed from proximal thigh to toes.  I was not able to interrogate the proximal aspect of the bypass with angiography, but began studying the bypass several centimeters distally.  No active extravasation was noted.  Good flow was  noted through the distal anastomosis into the native artery.  Good flow was noted into the foot through the native vessels.  Satisfied we ended the case here.  A 15 [place] JP drain was left in the wound bed.  The wound was closed in layers using 2-0 Vicryl, horizontal mattress 2-0 nylon sutures, skin  stapler.  A customizable Prevena VAC was applied to the incision.  A mild compressive dressing was applied to the lower extremity.  Upon completion of the case instrument and sharps counts were confirmed correct. The patient was transferred to the PACU in good condition. I was present for all portions of the procedure.  Yevonne Aline. Stanford Breed, MD [facility] Phone Number: [phone] 09/15/2021 4:15 PM

## 2021-09-15 NOTE — Progress Notes (Signed)
Patient had initial, positive response to crystalloid resuscitation with SBP improved to 90s. CBC shows slight drop in Hgb to 9.8. I was called back to bedside about 30 minutes later to re-evaluate. Patient's blood pressure had again dropped into 25Q systolic. Thigh appears more tight, and ecchymotic. Despite reassuring hemaglobin, I am concerned he is bleeding because of the worsening thigh exam and the transient response to crystalloid. Plan return to OR for exploration of femoral-popliteal bypass. Will plan to transfer patient to ICU postoperatively. Give 25% albumin now. Use levophed to keep MAP > 65. Transfuse 2u PRBC. To OR as soon as room is ready.  Yevonne Aline. Stanford Breed, MD [facility] Phone Number: [phone] 09/15/2021 1:33 PM

## 2021-09-15 NOTE — Significant Event (Signed)
Rapid Response Event Note   Reason for Call :  hypotension  Initial Focused Assessment:  Received a call from the patient's RN stating that his BP was low (SBP 60s-70s). Patient was in the bedside chair on arrival. He was diaphoretic and complaining of pain at his right groin. He denies chest pain or shortness of breath. CBG 169. EKG showed normal sinus rhythm.   There was some concern that the patient could have had a TIA or seizure. No focal deficits noted on exam. The patient states that he has no seizure history. No loss of bowel or bladder. The patient did not appear to be post ictal.   New right hand 20 g IV started. Labs drawn include CBC, CMET, and troponin. 1L crystalloid bolus administered.   By the time that I was leaving, the patient endorses that he is feeling better. SBP was 88. His color was improving and he was not as diaphoretic as previously.    Event Summary:   MD Notified: Dr. Luan Pulling bedside Call Time: [place] Time: 1914 End Time: [place], RN

## 2021-09-15 NOTE — Transfer of Care (Signed)
Immediate Anesthesia Transfer of Care Note  Patient: [name]  Procedure(s) Performed: FEMORAL ARTERY RE EXPLORATION (Right) RIGHT LOWER EXTREMITY ANGIOGRAM (Right) APPLICATION OF WOUND VAC RIGTH LEG (Right)  Patient Location: PACU  Anesthesia Type:General  Level of Consciousness: drowsy  Airway & Oxygen Therapy: Patient Spontanous Breathing and Patient connected to face mask oxygen  Post-op Assessment: Report given to RN and Post -op Vital signs reviewed and stable  Post vital signs: Reviewed and stable  Last Vitals:  Vitals Value Taken Time  BP 103/58 09/15/21 1900  Temp 36.3 C 09/15/21 1652  Pulse 72 09/15/21 1903  Resp 18 09/15/21 1903  SpO2 95 % 09/15/21 1903  Vitals shown include unvalidated device data.  Last Pain:  Vitals:   09/15/21 1830  TempSrc:   PainSc: 6       Patients Stated Pain Goal: 1 (84/21/03 1281)  Complications: No notable events documented.

## 2021-09-15 NOTE — Progress Notes (Signed)
Pt becoming increasingly hypotensive.  Pt diaphoretic and weak.  Severe surgical pain in R groin.  Rapid response nurse and MD notified and in route to room.       09/15/21 1100  Vitals  Temp 97.9 F (36.6 C)  Temp Source Oral  BP (!) 77/66  MAP (mmHg) 71  BP Location Right Arm  BP Method Automatic  Patient Position (if appropriate) Sitting  Pulse Rate 80  Pulse Rate Source Monitor  ECG Heart Rate 79  Resp 16  Level of Consciousness  Level of Consciousness Alert  Oxygen Therapy  SpO2 94 %  O2 Device Room Air  Pain Assessment  Pain Scale 0-10  Pain Score 8  Patients Stated Pain Goal 1  Pain Type Surgical pain  Pain Location Groin  Pain Orientation Right  Pain Descriptors / Indicators Aching  Pain Frequency Constant  Pain Onset On-going  Pain Intervention(s) MD notified (Comment)  Multiple Pain Sites No  PCA/Epidural/Spinal Assessment  Respiratory Pattern Regular;Unlabored  Glasgow Coma Scale  Eye Opening 4  Best Verbal Response (NON-intubated) 5  Best Motor Response 6  Glasgow Coma Scale Score 15  MEWS Score  MEWS Temp 0  MEWS Systolic 2  MEWS Pulse 0  MEWS RR 0  MEWS LOC 0  MEWS Score 2  MEWS Score Color Yellow

## 2021-09-15 NOTE — Progress Notes (Signed)
Hydromorphone Syringe given to night shift RN, Phil Dopp. Waiting on portable to deliver PCA equipment to initiate infusion.

## 2021-09-15 NOTE — Anesthesia Procedure Notes (Signed)
Procedure Name: Intubation Date/Time: 09/15/2021 2:07 PM  Performed by: Barrington Ellison, CRNAPre-anesthesia Checklist: Patient identified, Emergency Drugs available, Suction available and Patient being monitored Patient Re-evaluated:Patient Re-evaluated prior to induction Oxygen Delivery Method: Circle System Utilized Preoxygenation: Pre-oxygenation with 100% oxygen Induction Type: IV induction, Rapid sequence and Cricoid Pressure applied Ventilation: Mask ventilation without difficulty Laryngoscope Size: Mac and 4 Grade View: Grade I Tube type: Oral Tube size: 7.5 mm Number of attempts: 1 Airway Equipment and Method: Stylet and Oral airway Placement Confirmation: ETT inserted through vocal cords under direct vision, positive ETCO2 and breath sounds checked- equal and bilateral Secured at: 22 cm Tube secured with: Tape Dental Injury: Teeth and Oropharynx as per pre-operative assessment

## 2021-09-15 NOTE — Evaluation (Signed)
.cwPhysical Therapy Evaluation Patient Details Name: [name] MRN: [id number] DOB: [birth year] Today's Date: 09/15/2021  History of Present Illness  Pt adm 6/16 for rt femoral popliteal bypass graft. PMH - RLE ulcer, copd, cad, HTN, AVR, MVR, PVD, CVA  Clinical Impression  Pt presents to PT with decr in mobility due to expected post op pain and sorenesss. Expect pt will make good progress back to baseline with mobility. Will follow acutely but doubt pt will need PT after DC.         Recommendations for follow up therapy are one component of a multi-disciplinary discharge planning process, led by the attending physician.  Recommendations may be updated based on patient status, additional functional criteria and insurance authorization.  Follow Up Recommendations No PT follow up    Assistance Recommended at Discharge PRN  Patient can return home with the following  Assist for transportation    Equipment Recommendations None recommended by PT  Recommendations for Other Services       Functional Status Assessment Patient has had a recent decline in their functional status and demonstrates the ability to make significant improvements in function in a reasonable and predictable amount of time.     Precautions / Restrictions Precautions Precautions: None Restrictions Weight Bearing Restrictions: No      Mobility  Bed Mobility Overal bed mobility: Needs Assistance Bed Mobility: Supine to Sit     Supine to sit: Min assist     General bed mobility comments: Assist to bring RLE off of bed    Transfers Overall transfer level: Needs assistance Equipment used: Rolling walker (2 wheels) Transfers: Sit to/from Stand Sit to Stand: Min guard           General transfer comment: Assist for safety. Verbal cues for hand placement    Ambulation/Gait Ambulation/Gait assistance: Min guard, Supervision Gait Distance (Feet): 200 Feet Assistive device: Rolling walker (2  wheels) Gait Pattern/deviations: Step-through pattern, Decreased step length - left, Decreased stance time - right, Antalgic Gait velocity: decr Gait velocity interpretation: <1.31 ft/sec, indicative of household ambulator   General Gait Details: Pt initially placing little weight on RLE. Incr weight bearing as distance increased. Assist for safety.  Stairs            Wheelchair Mobility    Modified Rankin (Stroke Patients Only)       Balance Overall balance assessment: No apparent balance deficits (not formally assessed)                                           Pertinent Vitals/Pain Pain Assessment Pain Assessment: Faces Faces Pain Scale: Hurts little more Pain Location: RLE Pain Descriptors / Indicators: Sore Pain Intervention(s): Limited activity within patient's tolerance, Monitored during session    Home Living Family/patient expects to be discharged to:: Private residence Living Arrangements: Spouse/significant other;Children;Other (Comment) Available Help at Discharge: Family;Available 24 hours/day Type of Home: House Home Access: Stairs to enter Entrance Stairs-Rails: Right Entrance Stairs-Number of Steps: 3   Home Layout: Multi-level;Able to live on main level with bedroom/bathroom Home Equipment: Rolling Walker (2 wheels)      Prior Function Prior Level of Function : Independent/Modified Independent;Driving             Mobility Comments: No assistive device       Hand Dominance        Extremity/Trunk Assessment   Upper  Extremity Assessment Upper Extremity Assessment: Defer to OT evaluation    Lower Extremity Assessment Lower Extremity Assessment: RLE deficits/detail RLE Deficits / Details: Limited by soreness       Communication   Communication: No difficulties  Cognition Arousal/Alertness: Awake/alert Behavior During Therapy: WFL for tasks assessed/performed Overall Cognitive Status: Within Functional Limits  for tasks assessed                                          General Comments General comments (skin integrity, edema, etc.): VSS on RA    Exercises     Assessment/Plan    PT Assessment Patient needs continued PT services  PT Problem List Decreased strength;Decreased range of motion;Decreased mobility;Pain       PT Treatment Interventions DME instruction;Gait training;Stair training;Functional mobility training;Therapeutic activities;Therapeutic exercise;Patient/family education    PT Goals (Current goals can be found in the Care Plan section)  Acute Rehab PT Goals Patient Stated Goal: return home PT Goal Formulation: With patient Time For Goal Achievement: 09/18/21 Potential to Achieve Goals: Good    Frequency Min 3X/week     Co-evaluation               AM-PAC PT "6 Clicks" Mobility  Outcome Measure Help needed turning from your back to your side while in a flat bed without using bedrails?: A Little Help needed moving from lying on your back to sitting on the side of a flat bed without using bedrails?: A Little Help needed moving to and from a bed to a chair (including a wheelchair)?: A Little Help needed standing up from a chair using your arms (e.g., wheelchair or bedside chair)?: A Little Help needed to walk in hospital room?: A Little Help needed climbing 3-5 steps with a railing? : A Little 6 Click Score: 18    End of Session   Activity Tolerance: Patient tolerated treatment well Patient left: in chair;with call bell/phone within reach Nurse Communication: Mobility status PT Visit Diagnosis: Other abnormalities of gait and mobility (R26.89);Pain Pain - Right/Left: Right Pain - part of body: Leg;Knee    Time: 6948-5462 PT Time Calculation (min) (ACUTE ONLY): 21 min   Charges:   PT Evaluation $PT Eval Low Complexity: [place] [facility] [place] 09/15/2021,  9:55 AM

## 2021-09-16 ENCOUNTER — Encounter (HOSPITAL_COMMUNITY): Payer: Self-pay | Admitting: Vascular Surgery

## 2021-09-16 ENCOUNTER — Other Ambulatory Visit (HOSPITAL_COMMUNITY): Payer: Medicare Other

## 2021-09-16 LAB — POCT I-STAT 7, (LYTES, BLD GAS, ICA,H+H)
Acid-base deficit: 1 mmol/L (ref 0.0–2.0)
Acid-base deficit: 1 mmol/L (ref 0.0–2.0)
Bicarbonate: 25.4 mmol/L (ref 20.0–28.0)
Bicarbonate: 24.8 mmol/L (ref 20.0–28.0)
Calcium, Ion: 1.12 mmol/L — ABNORMAL LOW (ref 1.15–1.40)
Calcium, Ion: 1.06 mmol/L — ABNORMAL LOW (ref 1.15–1.40)
HCT: 21 % — ABNORMAL LOW (ref 39.0–52.0)
HCT: 25 % — ABNORMAL LOW (ref 39.0–52.0)
Hemoglobin: 7.1 g/dL — ABNORMAL LOW (ref 13.0–17.0)
Hemoglobin: 8.5 g/dL — ABNORMAL LOW (ref 13.0–17.0)
O2 Saturation: 99 %
O2 Saturation: 100 %
Patient temperature: 36
Potassium: 4.3 mmol/L (ref 3.5–5.1)
Potassium: 4.3 mmol/L (ref 3.5–5.1)
Sodium: 139 mmol/L (ref 135–145)
Sodium: 138 mmol/L (ref 135–145)
TCO2: 27 mmol/L (ref 22–32)
TCO2: 26 mmol/L (ref 22–32)
pCO2 arterial: 52.5 mmHg — ABNORMAL HIGH (ref 32–48)
pCO2 arterial: 47.3 mmHg (ref 32–48)
pH, Arterial: 7.288 — ABNORMAL LOW (ref 7.35–7.45)
pH, Arterial: 7.327 — ABNORMAL LOW (ref 7.35–7.45)
pO2, Arterial: 155 mmHg — ABNORMAL HIGH (ref 83–108)
pO2, Arterial: 212 mmHg — ABNORMAL HIGH (ref 83–108)

## 2021-09-16 LAB — CBC
HCT: 25.9 % — ABNORMAL LOW (ref 39.0–52.0)
Hemoglobin: 8.7 g/dL — ABNORMAL LOW (ref 13.0–17.0)
MCH: 30.7 pg (ref 26.0–34.0)
MCHC: 33.6 g/dL (ref 30.0–36.0)
MCV: 91.5 fL (ref 80.0–100.0)
Platelets: 97 10*3/uL — ABNORMAL LOW (ref 150–400)
RBC: 2.83 MIL/uL — ABNORMAL LOW (ref 4.22–5.81)
RDW: 16.6 % — ABNORMAL HIGH (ref 11.5–15.5)
WBC: 11 10*3/uL — ABNORMAL HIGH (ref 4.0–10.5)
nRBC: 0 % (ref 0.0–0.2)

## 2021-09-16 LAB — GLUCOSE, CAPILLARY
Glucose-Capillary: 132 mg/dL — ABNORMAL HIGH (ref 70–99)
Glucose-Capillary: 132 mg/dL — ABNORMAL HIGH (ref 70–99)
Glucose-Capillary: 154 mg/dL — ABNORMAL HIGH (ref 70–99)
Glucose-Capillary: 215 mg/dL — ABNORMAL HIGH (ref 70–99)

## 2021-09-16 LAB — PROTIME-INR
INR: 1.4 — ABNORMAL HIGH (ref 0.8–1.2)
Prothrombin Time: 17.3 seconds — ABNORMAL HIGH (ref 11.4–15.2)

## 2021-09-16 LAB — HEPARIN LEVEL (UNFRACTIONATED): Heparin Unfractionated: 0.8 IU/mL — ABNORMAL HIGH (ref 0.30–0.70)

## 2021-09-16 MED ORDER — SODIUM CHLORIDE 0.9% FLUSH
10.0000 mL | INTRAVENOUS | Status: DC | PRN
  Administered 2021-09-23 – 2021-09-24 (×2): 20 mL

## 2021-09-16 MED ORDER — HEPARIN (PORCINE) 25000 UT/250ML-% IV SOLN
1200.0000 [IU]/h | INTRAVENOUS | Status: DC
  Administered 2021-09-16: 1300 [IU]/h via INTRAVENOUS
  Filled 2021-09-16: qty 250

## 2021-09-16 NOTE — Progress Notes (Signed)
ANTICOAGULATION CONSULT NOTE  Pharmacy Consult for Heparin Indication: atrial fibrillation and mechanical MVR  Not on File  Patient Measurements: Height: '5\' 8"'$  (172.7 cm) Weight: 88.5 kg (195 lb) IBW/kg (Calculated) : 68.4  Heparin Dosing Weight: 86 kg  Vital Signs: Temp: 99.3 F (37.4 C) (06/18 1518) Temp Source: Oral (06/18 1518) BP: 125/59 (06/18 1600) Pulse Rate: 90 (06/18 1600)  Labs: Recent Labs    09/14/21 0538 09/14/21 2136 09/15/21 0049 09/15/21 1143 09/15/21 1325 09/15/21 1438 09/15/21 1535 09/15/21 1810 09/16/21 0421 09/16/21 1627  HGB  --    < > 11.7* 9.9*  --  7.1* 8.5*  --  8.7*  --   HCT  --    < > 37.0* 30.5*  --  21.0* 25.0*  --  25.9*  --   PLT  --   --  125* 140*  --   --   --   --  97*  --   APTT 44*  --   --   --   --   --   --   --   --   --   LABPROT 14.0  --  15.0  --   --   --   --   --  17.3*  --   INR 1.1  --  1.2  --   --   --   --   --  1.4*  --   HEPARINUNFRC  --    < > 0.71*  --   --   --   --  0.54  --  0.80*  CREATININE  --   --  1.12  --  0.99  --   --   --   --   --   TROPONINIHS  --   --   --   --  5  --   --  5  --   --    < > = values in this interval not displayed.     Estimated Creatinine Clearance: 80.4 mL/min (by C-G formula based on SCr of 0.99 mg/dL).   Medical History: Past Medical History:  Diagnosis Date   Anginal pain ([place])    Cancer ([place])    skin   COPD (chronic obstructive pulmonary disease) ([place])    Coronary artery disease    Dyspnea    Dysrhythmia    afib, s/p LAA ligation/MAZE 2013; recurrent aflutter 04/2021   History of hiatal hernia    medication related in 20s   Hx of artificial heart valve replacement    Hypertension    Mitral insufficiency and aortic stenosis    mechanical AVR/MVR 2013 in [place]   Myocardial infarction ([place])    Pneumonia    as a child   PVD (peripheral vascular disease) ([place])    Rheumatic heart disease    Stroke [facility])      Assessment: 66 y.o. 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 He has occlusive SFA disease now s/p endarterectomy. Pharmacy consulted to manage heparin while PTA warfarin on hold. Rapid was called for hypotension on 6/17, pt was taken back to OR for evaluation. Heparin was held at that time. Pt is now stable and pharmacy is consulted to restart heparin with no bolus per VVS. Warfarn will continue to be held. Will follow plans per VVS.  Heparin level is supratherapeutic this evening at 0.8.no bleeding or infusion issues noted.  Goal of Therapy:  Heparin level 0.3-0.7 units/ml Monitor  platelets by anticoagulation protocol: Yes   Plan: Decrease heparin to 1200 units/hr  6 hour heparin level Daily heparin level and CBC    Thank you for allowing [place] to participate in this patients care. Jens Som, PharmD 09/16/2021 5:17 PM  **Pharmacist phone directory can be found on [email or web address] listed under [place]**

## 2021-09-16 NOTE — Progress Notes (Signed)
PT Cancellation Note  Patient Details Name: [name] MRN: [id number] DOB: [birth year]   Cancelled Treatment:    Reason Eval/Treat Not Completed: Other (comment). Pt underwent emergent OR yesterday for hemorrhagic shock and reexploration of RLE. Pt deferred PT today due to a little anxious about more bleeding occurring. Will follow up tomorrow.    Shary Decamp [facility] 09/16/2021, 12:01 PM [place] Office [phone]

## 2021-09-16 NOTE — Progress Notes (Signed)
Art line removed - pressure dressing applied to right wrist.

## 2021-09-16 NOTE — Progress Notes (Addendum)
  Progress Note    09/16/2021 9:30 AM 1 Day Post-Op  Subjective:  feels 100% better  Tm 99.3 HR 70's-90's 841'Y-606'T systolic 01% RA  Vitals:   09/16/21 0700 09/16/21 0808  BP:    Pulse:    Resp:  20  Temp: 99.3 F (37.4 C)   SpO2:  93%    Physical Exam: Cardiac:  regular Lungs:  non labored Incisions:  right leg with ace wrap in place Extremities:  +doppler signal right DP/PT   CBC    Component Value Date/Time   WBC 11.0 (H) 09/16/2021 0421   RBC 2.83 (L) 09/16/2021 0421   HGB 8.7 (L) 09/16/2021 0421   HCT 25.9 (L) 09/16/2021 0421   PLT 97 (L) 09/16/2021 0421   MCV 91.5 09/16/2021 0421   MCH 30.7 09/16/2021 0421   MCHC 33.6 09/16/2021 0421   RDW 16.6 (H) 09/16/2021 0421   LYMPHSABS 818 (L) 03/08/2021 1117   MONOABS 0.8 05/11/2019 1352   EOSABS 112 03/08/2021 1117   BASOSABS 33 03/08/2021 1117    BMET    Component Value Date/Time   NA 138 09/15/2021 1535   K 4.3 09/15/2021 1535   CL 109 09/15/2021 1325   CO2 23 09/15/2021 1325   GLUCOSE 170 (H) 09/15/2021 1325   BUN 19 09/15/2021 1325   CREATININE 0.99 09/15/2021 1325   CREATININE 0.90 03/08/2021 1117   CALCIUM 6.8 (L) 09/15/2021 1325   GFRNONAA >60 09/15/2021 1325   GFRNONAA 89 11/17/2019 1258   GFRAA 104 11/17/2019 1258    INR    Component Value Date/Time   INR 1.4 (H) 09/16/2021 0421     Intake/Output Summary (Last 24 hours) at 09/16/2021 0930 Last data filed at 09/16/2021 0600 Gross per 24 hour  Intake 3009.21 ml  Output 1625 ml  Net 1384.21 ml     Assessment/Plan:  66 y.o. male is s/p:  Right femoral to above knee popliteal bypass with ipsilateral GSV 2 Days Post-Op  And 1) exploration of right femoral-popliteal bypass 2) angiogram of right lower extremity 3) application of wound vac 1 Day Post-Op   -pt doing well this morning and off pressors.  Will keep ace wrap in tact. -he has +right DP/PT doppler signals -will restart heparin gtt today without bolus -transfer to 4  east -keep JP drain   Leontine Locket, PA-C [facility] [phone] 09/16/2021 9:30 AM  VASCULAR STAFF ADDENDUM: I have independently interviewed and examined the patient. I agree with the above.   Yevonne Aline. Stanford Breed, MD [facility] Phone Number: [phone] 09/16/2021 10:33 AM

## 2021-09-16 NOTE — Progress Notes (Signed)
ANTICOAGULATION CONSULT NOTE  Pharmacy Consult for Heparin Indication: atrial fibrillation and mechanical MVR  Not on File  Patient Measurements: Height: '5\' 8"'$  (172.7 cm) Weight: 88.5 kg (195 lb) IBW/kg (Calculated) : 68.4  Heparin Dosing Weight: 86 kg  Vital Signs: Temp: 99.3 F (37.4 C) (06/18 0700) Temp Source: Axillary (06/18 0700) BP: 112/46 (06/18 0900) Pulse Rate: 80 (06/18 0900)  Labs: Recent Labs    09/14/21 0538 09/14/21 2136 09/15/21 0049 09/15/21 0049 09/15/21 1143 09/15/21 1325 09/15/21 1438 09/15/21 1535 09/15/21 1810 09/16/21 0421  HGB  --   --  11.7*   < > 9.9*  --  7.1* 8.5*  --  8.7*  HCT  --   --  37.0*   < > 30.5*  --  21.0* 25.0*  --  25.9*  PLT  --   --  125*  --  140*  --   --   --   --  97*  APTT 44*  --   --   --   --   --   --   --   --   --   LABPROT 14.0  --  15.0  --   --   --   --   --   --  17.3*  INR 1.1  --  1.2  --   --   --   --   --   --  1.4*  HEPARINUNFRC  --  0.47 0.71*  --   --   --   --   --  0.54  --   CREATININE  --   --  1.12  --   --  0.99  --   --   --   --   TROPONINIHS  --   --   --   --   --  5  --   --  5  --    < > = values in this interval not displayed.     Estimated Creatinine Clearance: 80.4 mL/min (by C-G formula based on SCr of 0.99 mg/dL).   Medical History: Past Medical History:  Diagnosis Date   Anginal pain ([place])    Cancer ([place])    skin   COPD (chronic obstructive pulmonary disease) ([place])    Coronary artery disease    Dyspnea    Dysrhythmia    afib, s/p LAA ligation/MAZE 2013; recurrent aflutter 04/2021   History of hiatal hernia    medication related in 20s   Hx of artificial heart valve replacement    Hypertension    Mitral insufficiency and aortic stenosis    mechanical AVR/MVR 2013 in [place]   Myocardial infarction ([place])    Pneumonia    as a child   PVD (peripheral vascular disease) ([place])    Rheumatic heart disease    Stroke [facility])      Assessment: 66 y.o. 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 He has occlusive SFA disease now s/p endarterectomy. Pharmacy consulted to manage heparin while PTA warfarin on hold. Rapid was called for hypotension on 6/17 and pt was taken beck to OR for evaluation. Heparin was held at that time. Pt is now stable and pharmacy is consulted to restart heparin with no bolus per VVS. Warfarn will continue to be held. Will follow plans per VVS.  Goal of Therapy:  Heparin level 0.3-0.7 units/ml Monitor platelets by anticoagulation protocol: Yes   Plan: Start heparin at 1300 units/hr  6 hour heparin level Daily heparin level and CBC   Thank you for allowing pharmacy to participate in this patient's care.  Reatha Harps, PharmD PGY1 Pharmacy Resident 09/16/2021 9:55 AM Check [email or web address] for unit specific pharmacy number

## 2021-09-17 ENCOUNTER — Inpatient Hospital Stay (HOSPITAL_COMMUNITY): Payer: Medicare Other

## 2021-09-17 DIAGNOSIS — R008 Other abnormalities of heart beat: Secondary | ICD-10-CM | POA: Diagnosis not present

## 2021-09-17 LAB — PROTIME-INR
INR: 1.6 — ABNORMAL HIGH (ref 0.8–1.2)
Prothrombin Time: 19.3 seconds — ABNORMAL HIGH (ref 11.4–15.2)

## 2021-09-17 LAB — CBC
HCT: 19.1 % — ABNORMAL LOW (ref 39.0–52.0)
Hemoglobin: 6.6 g/dL — CL (ref 13.0–17.0)
MCH: 32 pg (ref 26.0–34.0)
MCHC: 34.6 g/dL (ref 30.0–36.0)
MCV: 92.7 fL (ref 80.0–100.0)
Platelets: 81 10*3/uL — ABNORMAL LOW (ref 150–400)
RBC: 2.06 MIL/uL — ABNORMAL LOW (ref 4.22–5.81)
RDW: 16.1 % — ABNORMAL HIGH (ref 11.5–15.5)
WBC: 8.7 10*3/uL (ref 4.0–10.5)
nRBC: 0 % (ref 0.0–0.2)

## 2021-09-17 LAB — ECHOCARDIOGRAM COMPLETE
AR max vel: 1.23 cm2
AV Area VTI: 1.21 cm2
AV Area mean vel: 1.23 cm2
AV Mean grad: 23 mmHg
AV Peak grad: 40.1 mmHg
Ao pk vel: 3.17 m/s
Area-P 1/2: 2.95 cm2
Calc EF: 57.9 %
Height: 68 in
MV VTI: 1.57 cm2
P 1/2 time: 614 msec
S' Lateral: 3.2 cm
Single Plane A2C EF: 59.3 %
Single Plane A4C EF: 58.4 %
Weight: 3120 oz

## 2021-09-17 LAB — HEPARIN LEVEL (UNFRACTIONATED)
Heparin Unfractionated: 1 IU/mL — ABNORMAL HIGH (ref 0.30–0.70)
Heparin Unfractionated: 0.37 IU/mL (ref 0.30–0.70)

## 2021-09-17 LAB — PREPARE RBC (CROSSMATCH)

## 2021-09-17 MED ORDER — FUROSEMIDE 10 MG/ML IJ SOLN
20.0000 mg | Freq: Once | INTRAMUSCULAR | Status: AC
Start: 2021-09-17 — End: 2021-09-17
  Administered 2021-09-17: 20 mg via INTRAVENOUS
  Filled 2021-09-17: qty 2

## 2021-09-17 MED ORDER — SODIUM CHLORIDE 0.9% IV SOLUTION: Freq: Once | INTRAVENOUS | Status: AC

## 2021-09-17 MED ORDER — FUROSEMIDE 10 MG/ML IJ SOLN: 20.0000 mg | Freq: Once | INTRAMUSCULAR | Status: DC

## 2021-09-17 MED ORDER — HEPARIN (PORCINE) 25000 UT/250ML-% IV SOLN
900.0000 [IU]/h | INTRAVENOUS | Status: DC
  Administered 2021-09-17 – 2021-09-18 (×2): 900 [IU]/h via INTRAVENOUS
  Filled 2021-09-17 (×2): qty 250

## 2021-09-17 NOTE — Progress Notes (Signed)
Physical Therapy Treatment Patient Details Name: [name] MRN: [id number] DOB: 1955-11-10 Today's Date: 09/17/2021   History of Present Illness Pt adm 6/16 for rt femoral popliteal bypass graft. Underwent emergent surgery 6/17 for hemorrhagic shock,  reexploration of RLE, angiogram of right lower extremity and application of wound vac  PMH - RLE ulcer, copd, cad, HTN, AVR, MVR, PVD, CVA    PT Comments    Pt received supine and agreeable to session, however slow progress towards goals secondary to pain and pt receiving blood. Pt able to self mobilize RLE to and off EOB with gait belt as strap and requiring mod assist to elevate trunk. Pt with fair tolerance for standing exercise demonstrating ability to stand at bedside for majority of session with RW support and no physical assist needed. Pt requesting to sit up on EOB at end of session, RN notified. Pt continues to benefit from skilled PT services to progress toward functional mobility goals.    Recommendations for follow up therapy are one component of a multi-disciplinary discharge planning process, led by the attending physician.  Recommendations may be updated based on patient status, additional functional criteria and insurance authorization.  Follow Up Recommendations  No PT follow up     Assistance Recommended at Discharge PRN  Patient can return home with the following Assist for transportation   Equipment Recommendations  None recommended by PT    Recommendations for Other Services       Precautions / Restrictions Precautions Precaution Comments: wound vac; jp drain Required Braces or Orthoses: Other Brace Other Brace: scrotal sling Restrictions Weight Bearing Restrictions: No     Mobility  Bed Mobility Overal bed mobility: Needs Assistance Bed Mobility: Supine to Sit     Supine to sit: Mod assist     General bed mobility comments: mod A to move RLE and transition trunk upright, pt using gait belt as strap  for RLE    Transfers Overall transfer level: Needs assistance Equipment used: Rolling walker (2 wheels) Transfers: Sit to/from Stand Sit to Stand: Min assist           General transfer comment: able to stand with min A then side step 3 steps to [facility] with min guard A    Ambulation/Gait               General Gait Details: deferred as pt recieving blood   Stairs             Wheelchair Mobility    Modified Rankin (Stroke Patients Only)       Balance Overall balance assessment: Needs assistance   Sitting balance-Leahy Scale: Fair Sitting balance - Comments: required supporta at times to hold self up due to dizziness and "tightness" in leg                                    Cognition Arousal/Alertness: Awake/alert Behavior During Therapy: WFL for tasks assessed/performed Overall Cognitive Status: Within Functional Limits for tasks assessed                                          Exercises Other Exercises Other Exercises: heel/toe raises x8 on RLE, marching RLE x10 Other Exercises: static standing ~18 mins    General Comments General comments (skin integrity, edema, etc.): VSS on RA  Pertinent Vitals/Pain Pain Assessment Pain Assessment: Faces Faces Pain Scale: Hurts little more Pain Location: RLE Pain Descriptors / Indicators: Sore Pain Intervention(s): Monitored during session, Repositioned, Limited activity within patient's tolerance    Home Living                          Prior Function            PT Goals (current goals can now be found in the care plan section) Acute Rehab PT Goals PT Goal Formulation: With patient Time For Goal Achievement: 09/18/21    Frequency    Min 3X/week      PT Plan      Co-evaluation              AM-PAC PT "6 Clicks" Mobility   Outcome Measure  Help needed turning from your back to your side while in a flat bed without using bedrails?: A  Little Help needed moving from lying on your back to sitting on the side of a flat bed without using bedrails?: A Little Help needed moving to and from a bed to a chair (including a wheelchair)?: A Little Help needed standing up from a chair using your arms (e.g., wheelchair or bedside chair)?: A Little Help needed to walk in hospital room?: A Little Help needed climbing 3-5 steps with a railing? : A Little 6 Click Score: 18    End of Session   Activity Tolerance: Patient tolerated treatment well Patient left: in bed;with call bell/phone within reach (seated EOB, RN notified) Nurse Communication: Mobility status PT Visit Diagnosis: Other abnormalities of gait and mobility (R26.89);Pain Pain - Right/Left: Right Pain - part of body: Leg;Knee     Time: 5784-6962 PT Time Calculation (min) (ACUTE ONLY): 26 min  Charges:  $Therapeutic Exercise: 8-22 mins $Therapeutic Activity: 8-22 mins                     Mikai Meints R. PTA Acute Rehabilitation Services Office: [place] 09/17/2021, 2:40 PM

## 2021-09-17 NOTE — Progress Notes (Signed)
ANTICOAGULATION CONSULT NOTE  Pharmacy Consult for Heparin Indication: atrial fibrillation and mechanical MVR   Patient Measurements: Height: '5\' 8"'$  (172.7 cm) Weight: 88.5 kg (195 lb) IBW/kg (Calculated) : 68.4  Heparin Dosing Weight: 86 kg  Vital Signs: Temp: 98 F (36.7 C) (06/19 1602) Temp Source: Oral (06/19 1602) BP: 125/54 (06/19 1602) Pulse Rate: 87 (06/19 1602)  Labs: Recent Labs    09/15/21 0049 09/15/21 1143 09/15/21 1325 09/15/21 1438 09/15/21 1535 09/15/21 1810 09/16/21 0421 09/16/21 1627 09/17/21 0700 09/17/21 0816 09/17/21 1636  HGB 11.7* 9.9*  --    < > 8.5*  --  8.7*  --  6.6*  --   --   HCT 37.0* 30.5*  --    < > 25.0*  --  25.9*  --  19.1*  --   --   PLT 125* 140*  --   --   --   --  97*  --  81*  --   --   LABPROT 15.0  --   --   --   --   --  17.3*  --   --  19.3*  --   INR 1.2  --   --   --   --   --  1.4*  --   --  1.6*  --   HEPARINUNFRC 0.71*  --   --   --   --  0.54  --  0.80*  --  1.00* 0.37  CREATININE 1.12  --  0.99  --   --   --   --   --   --   --   --   TROPONINIHS  --   --  5  --   --  5  --   --   --   --   --    < > = values in this interval not displayed.     Estimated Creatinine Clearance: 80.4 mL/min (by C-G formula based on SCr of 0.99 mg/dL).   Assessment: 66 y.o. 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 He has occlusive SFA disease now s/p endarterectomy. Pharmacy consulted to manage heparin while PTA warfarin on hold. Rapid was called for hypotension on 6/17, pt was taken back to OR for evaluation. Heparin was held at that time. Pharmacy  consulted to restart heparin with no bolus per VVS. Warfarn will continue to be held. Will follow plans per VVS.  Heparin level therapeutic (0.37) on infusion at 900 units/hr. Noted pt s/p PRBC x 2 units this morning for Hgb 6.6. Wound vac continues with bloody drainage - MD aware.  Goal of Therapy:  Heparin level 0.3-0.7 units/ml Monitor platelets by  anticoagulation protocol: Yes   Plan: Continue heparin at 900 units/hr Will f/u a.m. heparin level and CBC closely  Sherlon Handing, PharmD, BCPS Please see amion for complete clinical pharmacist phone list 09/17/2021 5:11 PM

## 2021-09-17 NOTE — Progress Notes (Deleted)
ANTICOAGULATION CONSULT NOTE  Pharmacy Consult for Heparin Indication: atrial fibrillation and mechanical MVR   Patient Measurements: Height: '5\' 8"'$  (172.7 cm) Weight: 88.5 kg (195 lb) IBW/kg (Calculated) : 68.4  Heparin Dosing Weight: 86 kg  Vital Signs: Temp: 98 F (36.7 C) (06/19 1602) Temp Source: Oral (06/19 1602) BP: 125/54 (06/19 1602) Pulse Rate: 87 (06/19 1602)  Labs: Recent Labs    09/15/21 0049 09/15/21 1143 09/15/21 1325 09/15/21 1438 09/15/21 1535 09/15/21 1810 09/16/21 0421 09/16/21 1627 09/17/21 0700 09/17/21 0816 09/17/21 1636  HGB 11.7* 9.9*  --    < > 8.5*  --  8.7*  --  6.6*  --   --   HCT 37.0* 30.5*  --    < > 25.0*  --  25.9*  --  19.1*  --   --   PLT 125* 140*  --   --   --   --  97*  --  81*  --   --   LABPROT 15.0  --   --   --   --   --  17.3*  --   --  19.3*  --   INR 1.2  --   --   --   --   --  1.4*  --   --  1.6*  --   HEPARINUNFRC 0.71*  --   --   --   --  0.54  --  0.80*  --  1.00* 0.37  CREATININE 1.12  --  0.99  --   --   --   --   --   --   --   --   TROPONINIHS  --   --  5  --   --  5  --   --   --   --   --    < > = values in this interval not displayed.     Estimated Creatinine Clearance: 80.4 mL/min (by C-G formula based on SCr of 0.99 mg/dL).   Medical History: Past Medical History:  Diagnosis Date   Anginal pain ([place])    Cancer ([place])    skin   COPD (chronic obstructive pulmonary disease) ([place])    Coronary artery disease    Dyspnea    Dysrhythmia    afib, s/p LAA ligation/MAZE 2013; recurrent aflutter 04/2021   History of hiatal hernia    medication related in 20s   Hx of artificial heart valve replacement    Hypertension    Mitral insufficiency and aortic stenosis    mechanical AVR/MVR 2013 in [place]   Myocardial infarction ([place])    Pneumonia    as a child   PVD (peripheral vascular disease) ([place])    Rheumatic heart disease    Stroke [facility])      Assessment: 66 y.o. male with mixed venous and arterial  ulceration of the right lower extremity. He has occlusive SFA disease now s/p endarterectomy. 6/17, pt was taken back to OR for evaluation.  Pharmacy consulted to restart heparin with no bolus per VVS. Warfarn will continue to be held. Will follow plans per VVS.  Hgb drop to 6.6 this morning, 1 unit PRBCs given. PLTC 140>97> 81. INR up to 1.6, last warfarin dose given 6/17 was '10mg'$  dose.  RN reported wound vac ~ 50 ml bloody drainage this morning from 0700-0900.   Update: Heparin level is within therapeutic range this evening at 0.37 after rate decrease to 900 units/hr. Per RN, no issues with infusion  or bleeding this afternoon.   Goal of Therapy:  Heparin level 0.3-0.7 units/ml Monitor platelets by anticoagulation protocol: Yes   Plan: Continue heparin infusion at 900 units/hr Check anti-Xa level in 6 hours and daily while on heparin Continue to monitor H&H and platelets    Thank you for allowing [place] to participate in this patients care. Jens Som, PharmD 09/17/2021 5:17 PM  **Pharmacist phone directory can be found on [email or web address] listed under [place]**

## 2021-09-17 NOTE — Progress Notes (Signed)
6/19  Spoke to Gae Bon (spouse) via telephone who was present at bedside. Pt rcv'd initial IM Letter, letter was explained and acknowledged.

## 2021-09-17 NOTE — Evaluation (Signed)
Occupational Therapy Evaluation Patient Details Name: [name] MRN: [id number] DOB: 1955/04/20 Today's Date: 09/17/2021   History of Present Illness Pt adm 6/16 for rt femoral popliteal bypass graft. Underwent emergent surgery 6/17 for hemorrhagic shock,  reexploration of RLE, angiogram of right lower extremity and application of wound vac  PMH - RLE ulcer, copd, cad, HTN, AVR, MVR, PVD, CVA   Clinical Impression   PTA pt is retired and lives independently with his wife. Pt with significant scrotal edema, causing discomfort with bed mobility. Will return to fabricate scrotal sling to increase ability to mobilize OOB with decreased pain.      Recommendations for follow up therapy are one component of a multi-disciplinary discharge planning process, led by the attending physician.  Recommendations may be updated based on patient status, additional functional criteria and insurance authorization.   Follow Up Recommendations  No OT follow up    Assistance Recommended at Discharge Intermittent Supervision/Assistance  Patient can return home with the following A little help with bathing/dressing/bathroom;Assistance with cooking/housework;Assist for transportation    Functional Status Assessment  Patient has had a recent decline in their functional status and demonstrates the ability to make significant improvements in function in a reasonable and predictable amount of time.  Equipment Recommendations  BSC/3in1    Recommendations for Other Services       Precautions / Restrictions Precautions Precaution Comments: wound vac; jp drain Restrictions Weight Bearing Restrictions: No      Mobility Bed Mobility Overal bed mobility: Needs Assistance Bed Mobility: Rolling                Transfers                   General transfer comment: will further assess      Balance                                           ADL either performed or assessed  with clinical judgement   ADL Overall ADL's : Needs assistance/impaired     Grooming: Set up   Upper Body Bathing: Set up   Lower Body Bathing: Moderate assistance   Upper Body Dressing : Set up   Lower Body Dressing: Maximal assistance       Toileting- Clothing Manipulation and Hygiene: Moderate assistance               Vision Baseline Vision/History: 1 Wears glasses       Perception     Praxis      Pertinent Vitals/Pain Pain Assessment Pain Assessment: 0-10 Pain Score: 2  Pain Location: RLE Pain Descriptors / Indicators: Sore Pain Intervention(s): Limited activity within patient's tolerance     Hand Dominance Right   Extremity/Trunk Assessment Upper Extremity Assessment Upper Extremity Assessment: Overall WFL for tasks assessed   Lower Extremity Assessment Lower Extremity Assessment: Defer to PT evaluation RLE Deficits / Details: edematous RLE   Cervical / Trunk Assessment Cervical / Trunk Assessment: Normal   Communication Communication Communication: No difficulties   Cognition Arousal/Alertness: Awake/alert Behavior During Therapy: WFL for tasks assessed/performed Overall Cognitive Status: Within Functional Limits for tasks assessed                                       General Comments  Exercises     Shoulder Instructions      Home Living Family/patient expects to be discharged to:: Private residence Living Arrangements: Spouse/significant other;Children;Other (Comment) Available Help at Discharge: Family;Available 24 hours/day Type of Home: House Home Access: Stairs to enter [employer] of Steps: 3 Entrance Stairs-Rails: Right Home Layout: Multi-level;Able to live on main level with bedroom/bathroom (bathroom upstairs)     Bathroom Shower/Tub: Tub/shower unit;Walk-in shower   Bathroom Toilet: Standard Bathroom Accessibility: Yes How Accessible: Accessible via walker Home Equipment: Rolling  Walker (2 wheels)          Prior Functioning/Environment Prior Level of Function : Independent/Modified Independent;Driving (retired)                        OT Problem List: Decreased strength;Decreased activity tolerance;Decreased safety awareness;Decreased knowledge of use of DME or AE;Cardiopulmonary status limiting activity;Impaired sensation;Pain;Increased edema      OT Treatment/Interventions: Self-care/ADL training;Therapeutic exercise;DME and/or AE instruction;Therapeutic activities;Patient/family education    OT Goals(Current goals can be found in the care plan section) Acute Rehab OT Goals Patient Stated Goal: to get better OT Goal Formulation: With patient Time For Goal Achievement: 10/01/21 Potential to Achieve Goals: Good  OT Frequency: Min 2X/week    Co-evaluation              AM-PAC OT "6 Clicks" Daily Activity     Outcome Measure Help from another person eating meals?: None Help from another person taking care of personal grooming?: A Little Help from another person toileting, which includes using toliet, bedpan, or urinal?: A Lot Help from another person bathing (including washing, rinsing, drying)?: A Lot Help from another person to put on and taking off regular upper body clothing?: A Little Help from another person to put on and taking off regular lower body clothing?: A Lot 6 Click Score: 16   End of Session Nurse Communication: Mobility status;Other (comment) (need for scrotal sling)  Activity Tolerance: Patient tolerated treatment well Patient left: in bed;with call bell/phone within reach  OT Visit Diagnosis: Unsteadiness on feet (R26.81);Other abnormalities of gait and mobility (R26.89);Muscle weakness (generalized) (M62.81);Pain Pain - Right/Left: Right Pain - part of body: Leg                Time: 0950-1009 OT Time Calculation (min): 19 min Charges:  OT General Charges $OT Visit: 1 Visit OT Evaluation $OT Eval Moderate  Complexity: [place], OT/L   Acute OT Clinical Specialist Acute Rehabilitation Services Pager [phone] Office [phone]   [facility] 09/17/2021, 11:15 AM

## 2021-09-17 NOTE — Progress Notes (Signed)
ANTICOAGULATION CONSULT NOTE  Pharmacy Consult for Heparin Indication: atrial fibrillation and mechanical MVR   Patient Measurements: Height: '5\' 8"'$  (172.7 cm) Weight: 88.5 kg (195 lb) IBW/kg (Calculated) : 68.4  Heparin Dosing Weight: 86 kg  Vital Signs: Temp: 98.4 F (36.9 C) (06/19 0802) Temp Source: Oral (06/19 0802) BP: 110/57 (06/19 0802) Pulse Rate: 85 (06/19 0802)  Labs: Recent Labs    09/15/21 0049 09/15/21 1143 09/15/21 1325 09/15/21 1438 09/15/21 1535 09/15/21 1810 09/16/21 0421 09/16/21 1627 09/17/21 0700 09/17/21 0816  HGB 11.7* 9.9*  --    < > 8.5*  --  8.7*  --  6.6*  --   HCT 37.0* 30.5*  --    < > 25.0*  --  25.9*  --  19.1*  --   PLT 125* 140*  --   --   --   --  97*  --  81*  --   LABPROT 15.0  --   --   --   --   --  17.3*  --   --  19.3*  INR 1.2  --   --   --   --   --  1.4*  --   --  1.6*  HEPARINUNFRC 0.71*  --   --   --   --  0.54  --  0.80*  --  1.00*  CREATININE 1.12  --  0.99  --   --   --   --   --   --   --   TROPONINIHS  --   --  5  --   --  5  --   --   --   --    < > = values in this interval not displayed.     Estimated Creatinine Clearance: 80.4 mL/min (by C-G formula based on SCr of 0.99 mg/dL).   Medical History: Past Medical History:  Diagnosis Date   Anginal pain ([place])    Cancer ([place])    skin   COPD (chronic obstructive pulmonary disease) ([place])    Coronary artery disease    Dyspnea    Dysrhythmia    afib, s/p LAA ligation/MAZE 2013; recurrent aflutter 04/2021   History of hiatal hernia    medication related in 20s   Hx of artificial heart valve replacement    Hypertension    Mitral insufficiency and aortic stenosis    mechanical AVR/MVR 2013 in [place]   Myocardial infarction ([place])    Pneumonia    as a child   PVD (peripheral vascular disease) ([place])    Rheumatic heart disease    Stroke [facility])      Assessment: 66 y.o. 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 He has occlusive  SFA disease now s/p endarterectomy. Pharmacy consulted to manage heparin while PTA warfarin on hold. Rapid was called for hypotension on 6/17, pt was taken back to OR for evaluation. Heparin was held at that time. Pharmacy  consulted to restart heparin with no bolus per VVS. Warfarn will continue to be held. Will follow plans per VVS.  Heparin level is supratherapeutic this evening  Hgb 6.6,  surgery has ordered to  transfuse PRBCs 1 unit this AM.   PLTC 140>97> 81 .  INR 1.4 >up to 1.6,  last warfarin dose given 6/17 was '10mg'$  dose. Warfarin on hold.  Heparin level is supratherapeutic  at 1.0 after heparin rate decreased to 1200 units/hr.   RN reports  wound  vac ~ 50 ml bloody drainage since 0700-0900.  No other bleeding noted.   Goal of Therapy:  Heparin level 0.3-0.7 units/ml Monitor platelets by anticoagulation protocol: Yes   Plan: HOLD heparin x 1 hour then resume heparin at reduced rate of 900 units/hr  (decreased by 4 units/kg/hr) Check 6hr HL  Daily HL, CBC Monitor CBC trend, closely for s/sx bleeding   Thank you for allowing [place] to participate in this patients care. Nicole Cella, RPh Clinical Pharmacist [phone] 09/17/2021 9:02 AM  **Pharmacist phone directory can be found on [email or web address] listed under [place]**

## 2021-09-17 NOTE — Progress Notes (Addendum)
  Progress Note    09/17/2021 6:42 AM 2 Days Post-Op  Subjective:  says he feels ok.  Denies any pain in his right foot  Afebrile HR 80's-90's 341'P-379'K systolic 24% RA  Vitals:   09/17/21 0353 09/17/21 0417  BP: (!) 109/58   Pulse: 83   Resp: 18 19  Temp: 98.7 F (37.1 C)   SpO2: 95% 95%    Physical Exam: Cardiac:  regular Lungs:  non labored Incisions:  right groin and proximal thigh with vac with good seal Extremities:  right leg wrapped with ace-clean and in tact; brisk right PT/DP doppler signal; scrotal ecchymosis and swelling   CBC    Component Value Date/Time   WBC 11.0 (H) 09/16/2021 0421   RBC 2.83 (L) 09/16/2021 0421   HGB 8.7 (L) 09/16/2021 0421   HCT 25.9 (L) 09/16/2021 0421   PLT 97 (L) 09/16/2021 0421   MCV 91.5 09/16/2021 0421   MCH 30.7 09/16/2021 0421   MCHC 33.6 09/16/2021 0421   RDW 16.6 (H) 09/16/2021 0421   LYMPHSABS 818 (L) 03/08/2021 1117   MONOABS 0.8 05/11/2019 1352   EOSABS 112 03/08/2021 1117   BASOSABS 33 03/08/2021 1117    BMET    Component Value Date/Time   NA 138 09/15/2021 1535   K 4.3 09/15/2021 1535   CL 109 09/15/2021 1325   CO2 23 09/15/2021 1325   GLUCOSE 170 (H) 09/15/2021 1325   BUN 19 09/15/2021 1325   CREATININE 0.99 09/15/2021 1325   CREATININE 0.90 03/08/2021 1117   CALCIUM 6.8 (L) 09/15/2021 1325   GFRNONAA >60 09/15/2021 1325   GFRNONAA 89 11/17/2019 1258   GFRAA 104 11/17/2019 1258    INR    Component Value Date/Time   INR 1.4 (H) 09/16/2021 0421     Intake/Output Summary (Last 24 hours) at 09/17/2021 0973 Last data filed at 09/17/2021 0017 Gross per 24 hour  Intake 819.7 ml  Output 885 ml  Net -65.3 ml   JP drain output: Shift 1:  230cc Shift 2:  55cc Total 285cc  Assessment/Plan:  66 y.o. male is s/p:  Right femoral to above knee popliteal bypass with ipsilateral GSV 3 Days Post-Op  And 1) exploration of right femoral-popliteal bypass 2) angiogram of right lower extremity 3)  application of wound vac  2 Days Post-Op   -pt with brisk right DP/PT doppler signal -acute blood loss anemia-VSS and CBC ordered and pending -gentle diuresis -continue JP drain -2D echo ordered and still pending -DVT prophylaxis:  heparin gtt for mechanical MVR -mobilize out of bed today -check labs in am   Leontine Locket, PA-C [facility] [phone] 09/17/2021 6:42 AM  VASCULAR STAFF ADDENDUM: I have independently interviewed and examined the patient. I agree with the above.  1u PRBC + lasix today. Will check echo. Mobilize as able. Keep prevena on x 1 week total. Keep RLE gently compressed with ACE.  Yevonne Aline. Stanford Breed, MD [facility] Phone Number: [phone] 09/17/2021 8:36 AM

## 2021-09-17 NOTE — Care Management Important Message (Signed)
Important Message  Patient Details  Name: [name] MRN: [id number] Date of Birth: 29-Aug-1955   Medicare Important Message Given:  Other (see comment)     Hannah Beat 09/17/2021, 3:10 PM

## 2021-09-17 NOTE — Progress Notes (Signed)
Occupational Therapy Treatment Note  Scrotal sling fabricated then pt mobilized to EOB with mod A. Dizziness EOB however VSS. Able to stand and urinate and take several steps up to [facility] with min A @ RW level. Ultrasound tech requesting pt return to bed for scan. Acute OT to follow. Do not anticipate the needs for OT follow up after DC. Pt very appreciative.     09/17/21 1100  OT Visit Information  Last OT Received On 09/17/21  Assistance Needed +1  History of Present Illness Pt adm 6/16 for rt femoral popliteal bypass graft. Underwent emergent surgery 6/17 for hemorrhagic shock,  reexploration of RLE, angiogram of right lower extremity and application of wound vac  PMH - RLE ulcer, copd, cad, HTN, AVR, MVR, PVD, CVA  Precautions  Precaution Comments wound vac; jp drain  Required Braces or Orthoses Other Brace  Other Brace scrotal sling  Pain Assessment  Pain Assessment 0-10  Pain Score 3  Pain Location RLE  Pain Descriptors / Indicators Sore  Pain Intervention(s) Limited activity within patient's tolerance  Cognition  Arousal/Alertness Awake/alert  Behavior During Therapy WFL for tasks assessed/performed  Overall Cognitive Status Within Functional Limits for tasks assessed  Lower Extremity Assessment  RLE Deficits / Details jp drain emptied by nursing  ADL  Overall ADL's  Needs assistance/impaired  Toileting - Clothing Manipulation Details (indicate cue type and reason) able to stand and hold urinal with assitance to uriante; sling providing support to scrotum  Functional mobility during ADLs Minimal assistance;Rolling walker (2 wheels)  Bed Mobility  Overal bed mobility Needs Assistance  Bed Mobility Supine to Sit;Sit to Supine  Supine to sit Mod assist  Sit to supine Mod assist  General bed mobility comments mod A to move RLE and transition trunk upright; increased dizziness with sitting EOB  Transfers  Overall transfer level Needs assistance  Equipment used Rolling walker (2  wheels)  Transfers Sit to/from Stand  Sit to Stand Min assist  General transfer comment able to stand with min A then side step 4 steps to [facility] with minguard A  Balance  Overall balance assessment Needs assistance  Sitting balance-Leahy Scale Fair  Sitting balance - Comments required supporta at times to hold self up due to dizziness and "tightness" in leg  Standing balance-Leahy Scale Poor  Standing balance comment reliant on RW  General Comments  General comments (skin integrity, edema, etc.) increased wheezing however SpO2 above 90 on RA  Exercises  Exercises Other exercises  Other Exercises  Other Exercises demonstrated use of sheet to help move RLE for exercise and bed mobility  Other Exercises would benefit form incentive spriometer  OT - End of Session  Equipment Utilized During Treatment Rolling walker (2 wheels)  Activity Tolerance Patient tolerated treatment well  Patient left in bed;with call bell/phone within reach  Nurse Communication Mobility status;Other (comment) (use of scrotal sling)  OT Assessment/Plan  OT Plan Discharge plan remains appropriate  OT Visit Diagnosis Unsteadiness on feet (R26.81);Other abnormalities of gait and mobility (R26.89);Muscle weakness (generalized) (M62.81);Pain  Pain - Right/Left Right  Pain - part of body Leg  OT Frequency (ACUTE ONLY) Min 2X/week  Follow Up Recommendations No OT follow up  Assistance recommended at discharge Intermittent Supervision/Assistance  Patient can return home with the following A little help with bathing/dressing/bathroom;Assistance with cooking/housework;Assist for transportation  OT Equipment PIR/5JO8  AM-PAC OT "6 Clicks" Daily Activity Outcome Measure (Version 2)  Help from another person eating meals? 4  Help from another  person taking care of personal grooming? 3  Help from another person toileting, which includes using toliet, bedpan, or urinal? 2  Help from another person bathing (including washing,  rinsing, drying)? 2  Help from another person to put on and taking off regular upper body clothing? 3  Help from another person to put on and taking off regular lower body clothing? 2  6 Click Score 16  Progressive Mobility  What is the highest level of mobility based on the progressive mobility assessment? Level 4 (Walks with assist in room) - Balance while marching in place and cannot step forward and back - Complete  Activity Stood at bedside (side steps up to [facility])  OT Goal Progression  Progress towards OT goals Progressing toward goals  Acute Rehab OT Goals  Patient Stated Goal to get better  OT Goal Formulation With patient  Time For Goal Achievement 10/01/21  Potential to Achieve Goals Good  OT Time Calculation  OT Start Time (ACUTE ONLY) 1025  OT Stop Time (ACUTE ONLY) 1104  OT Time Calculation (min) 39 min  OT General Charges  $OT Visit 1 Visit  OT Treatments  $Self Care/Home Management  23-37 mins  $Therapeutic Activity 8-22 mins  Maurie Boettcher, OT/L   Acute OT Clinical Specialist [place] Pager [phone] Office [phone]

## 2021-09-18 LAB — CBC
HCT: 17.1 % — ABNORMAL LOW (ref 39.0–52.0)
Hemoglobin: 5.5 g/dL — CL (ref 13.0–17.0)
MCH: 29.6 pg (ref 26.0–34.0)
MCHC: 32.2 g/dL (ref 30.0–36.0)
MCV: 91.9 fL (ref 80.0–100.0)
Platelets: 100 10*3/uL — ABNORMAL LOW (ref 150–400)
RBC: 1.86 MIL/uL — ABNORMAL LOW (ref 4.22–5.81)
RDW: 18.1 % — ABNORMAL HIGH (ref 11.5–15.5)
WBC: 9.1 10*3/uL (ref 4.0–10.5)
nRBC: 1.1 % — ABNORMAL HIGH (ref 0.0–0.2)

## 2021-09-18 LAB — HEMOGLOBIN AND HEMATOCRIT, BLOOD
HCT: 22.4 % — ABNORMAL LOW (ref 39.0–52.0)
Hemoglobin: 7.7 g/dL — ABNORMAL LOW (ref 13.0–17.0)

## 2021-09-18 LAB — PREPARE RBC (CROSSMATCH)

## 2021-09-18 LAB — BASIC METABOLIC PANEL
Anion gap: 6 (ref 5–15)
BUN: 15 mg/dL (ref 8–23)
CO2: 27 mmol/L (ref 22–32)
Calcium: 7.6 mg/dL — ABNORMAL LOW (ref 8.9–10.3)
Chloride: 103 mmol/L (ref 98–111)
Creatinine, Ser: 0.82 mg/dL (ref 0.61–1.24)
GFR, Estimated: >60 mL/min (ref >60)
Glucose, Bld: 144 mg/dL — ABNORMAL HIGH (ref 70–99)
Potassium: 3.6 mmol/L (ref 3.5–5.1)
Sodium: 136 mmol/L (ref 135–145)

## 2021-09-18 LAB — PROTIME-INR
INR: 1.4 — ABNORMAL HIGH (ref 0.8–1.2)
Prothrombin Time: 16.6 seconds — ABNORMAL HIGH (ref 11.4–15.2)

## 2021-09-18 LAB — HEPARIN LEVEL (UNFRACTIONATED): Heparin Unfractionated: 0.32 IU/mL (ref 0.30–0.70)

## 2021-09-18 MED ORDER — DIPHENHYDRAMINE HCL 25 MG PO CAPS: 25.0000 mg | ORAL_CAPSULE | Freq: Four times a day (QID) | ORAL | Status: DC | PRN

## 2021-09-18 MED ORDER — SODIUM CHLORIDE 0.9% IV SOLUTION: Freq: Once | INTRAVENOUS | Status: AC

## 2021-09-18 NOTE — Progress Notes (Signed)
Mobility Specialist Progress Note    09/18/21 1425  Mobility  Activity Dangled on edge of bed  Level of Assistance Minimal assist, patient does 75% or more  Assistive Device Other (Comment) (HHA)  Activity Response Tolerated well  $Mobility charge 1 Mobility   Pt received in bed and agreeable. No c/o pain. MinA for back support. Sat for ~10 minutes. Returned to supine in bed with call bell in reach.   Hildred Alamin Mobility Specialist

## 2021-09-18 NOTE — Progress Notes (Addendum)
Progress Note    09/18/2021 6:17 AM 3 Days Post-Op  Subjective:  pt resting comfortably.  Says he has some pain in his upper thigh.  Denies pain in his foot.  Thinks he will feel better when some of the swelling resides.  Denies any dizziness or lightheadedness.  Says he feels tired.   Afebrile HR 65'K-354'S systolic HR 56'C 12% RA  Vitals:   09/18/21 0553 09/18/21 0615  BP: (!) 111/53 (!) 128/52  Pulse: 86 85  Resp: 17 16  Temp: 98.4 F (36.9 C) 97.9 F (36.6 C)  SpO2: 98% 98%    Physical Exam: Cardiac:  regular Lungs:  non labored Incisions:  wound vac in place with good seal Extremities:  brisk right DP/PT doppler signals; right calf is soft without tenderness;  right thigh is somewhat tight but non tender   CBC    Component Value Date/Time   WBC 9.1 09/18/2021 0415   RBC 1.86 (L) 09/18/2021 0415   HGB 5.5 (LL) 09/18/2021 0415   HCT 17.1 (L) 09/18/2021 0415   PLT 100 (L) 09/18/2021 0415   MCV 91.9 09/18/2021 0415   MCH 29.6 09/18/2021 0415   MCHC 32.2 09/18/2021 0415   RDW 18.1 (H) 09/18/2021 0415   LYMPHSABS 818 (L) 03/08/2021 1117   MONOABS 0.8 05/11/2019 1352   EOSABS 112 03/08/2021 1117   BASOSABS 33 03/08/2021 1117    BMET    Component Value Date/Time   NA 136 09/18/2021 0415   K 3.6 09/18/2021 0415   CL 103 09/18/2021 0415   CO2 27 09/18/2021 0415   GLUCOSE 144 (H) 09/18/2021 0415   BUN 15 09/18/2021 0415   CREATININE 0.82 09/18/2021 0415   CREATININE 0.90 03/08/2021 1117   CALCIUM 7.6 (L) 09/18/2021 0415   GFRNONAA >60 09/18/2021 0415   GFRNONAA 89 11/17/2019 1258   GFRAA 104 11/17/2019 1258    INR    Component Value Date/Time   INR 1.4 (H) 09/18/2021 0415     Intake/Output Summary (Last 24 hours) at 09/18/2021 0617 Last data filed at 09/18/2021 0440 Gross per 24 hour  Intake 1373.05 ml  Output 2380 ml  Net -1006.95 ml    Drain output: JP drain:  1125cc/24 hrs (720cc 1st shift and 230cc 2nd shift) Vac:  175cc/24 hrs (125cc  1st shift and 50cc 2nd shift)    Assessment/Plan:  66 y.o. male is s/p:  Right femoral to above knee popliteal bypass with ipsilateral GSV 4 Days Post-Op  And 1) exploration of right femoral-popliteal bypass 2) angiogram of right lower extremity 3) application of wound vac   3 Days Post-Op   -pt continues to have brisk doppler signals right DP/PT and right calf is soft.  He does have tightness in the right thigh but this is relatively unchanged.  Ace wrap removed and right leg elevated on a pillow.  Will replace ace wrap for compression later this morning.  -pt with acute blood loss anemia-hgb 5.5 this am after receiving one unit yesterday.   2 units ordered for this morning.  He is on heparin for mechanical MVR and AVR.  Looks like drainage from JP has slowed down over the 2nd shift.  If it increases, may need to hold heparin for a few hours.  Hemodynamically stable -PT/OT recommend no follow up -continue mobilization  -labs in am   Leontine Locket, [place] [facility] [phone] 09/18/2021 6:17 AM  VASCULAR STAFF ADDENDUM: I have independently interviewed and examined the patient. I  agree with the above.  Agree with transfusion for anemia. Likely ABLA from surgery + heparin requirement for mechanical heart valve. No evidence of ongoing bleeding clinically. JP output slowing.  Patient very frustrated today and initially wanted transfer to [facility]. Now not sure.  Told him I will be happy to take care of him here, or find a surgeon at [facility].   Yevonne Aline. Stanford Breed, MD [facility] Phone Number: [phone] 09/18/2021 12:47 PM

## 2021-09-18 NOTE — Anesthesia Postprocedure Evaluation (Signed)
Anesthesia Post Note  Patient: [name]  Procedure(s) Performed: FEMORAL ARTERY RE EXPLORATION (Right) RIGHT LOWER EXTREMITY ANGIOGRAM (Right) APPLICATION OF WOUND VAC RIGTH LEG (Right)     Patient location during evaluation: PACU Anesthesia Type: General Level of consciousness: awake and alert Pain management: pain level controlled Vital Signs Assessment: post-procedure vital signs reviewed and stable Respiratory status: spontaneous breathing, nonlabored ventilation, respiratory function stable and patient connected to nasal cannula oxygen Cardiovascular status: blood pressure returned to baseline and stable Postop Assessment: no apparent nausea or vomiting Anesthetic complications: no   No notable events documented.  Last Vitals:  Vitals:   09/18/21 1642 09/18/21 1736  BP: (!) 132/56   Pulse: 82   Resp: (!) 22 (!) 21  Temp: 36.7 C   SpO2: 100% 96%    Last Pain:  Vitals:   09/18/21 1736  TempSrc:   PainSc: 0-No pain                 Kanon Novosel

## 2021-09-18 NOTE — Progress Notes (Signed)
ANTICOAGULATION CONSULT NOTE  Pharmacy Consult for Heparin Indication: atrial fibrillation and mechanical MVR   Patient Measurements: Height: '5\' 8"'$  (172.7 cm) Weight: 88.5 kg (195 lb) IBW/kg (Calculated) : 68.4 Heparin Dosing Weight: 86 kg  Vital Signs: Temp: 97.8 F (36.6 C) (06/20 0719) Temp Source: Axillary (06/20 0719) BP: 116/52 (06/20 0719) Pulse Rate: 85 (06/20 0719)  Labs: Recent Labs    09/15/21 1325 09/15/21 1438 09/15/21 1810 09/16/21 0421 09/16/21 1627 09/17/21 0700 09/17/21 0816 09/17/21 1636 09/18/21 0415  HGB  --    < >  --  8.7*  --  6.6*  --   --  5.5*  HCT  --    < >  --  25.9*  --  19.1*  --   --  17.1*  PLT  --   --   --  97*  --  81*  --   --  100*  LABPROT  --   --   --  17.3*  --   --  19.3*  --  16.6*  INR  --   --   --  1.4*  --   --  1.6*  --  1.4*  HEPARINUNFRC  --   --  0.54  --    < >  --  1.00* 0.37 0.32  CREATININE 0.99  --   --   --   --   --   --   --  0.82  TROPONINIHS 5  --  5  --   --   --   --   --   --    < > = values in this interval not displayed.     Estimated Creatinine Clearance: 97.1 mL/min (by C-G formula based on SCr of 0.82 mg/dL).   Assessment: 66 y.o. 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 He has occlusive SFA disease now s/p endarterectomy. Pharmacy consulted to manage heparin while PTA warfarin on hold. Rapid was called for hypotension on 6/17, pt was taken back to OR for evaluation. Heparin was held at that time. Pharmacy  consulted to restart heparin with no bolus per VVS. Warfarin will continue to be held. Will follow plans per VVS.  Heparin level therapeutic (0.32) on infusion at 900 units/hr. JP drainage slowed down over 2nd shift - if increases again may need to hold heparin drip. ABLA noted with Hgb down 5.5 - 2 units RBC ordered.  Goal of Therapy:  Heparin level 0.3-0.7 units/ml Monitor platelets by anticoagulation protocol: Yes   Plan: Continue heparin drip at 900  units/hr  Daily heparin level and CBC  Monitor for s/sx of bleeding  Thank you for involving pharmacy in this patient's care.  Renold Genta, PharmD, BCPS Clinical Pharmacist Clinical phone for 09/18/2021 until 3p is [phone] 09/18/2021 7:29 AM  **Pharmacist phone directory can be found on [email or web address] listed under [place]**

## 2021-09-18 NOTE — Progress Notes (Addendum)
JP drain output is bright red total drainage 845 ml on day shift. MD made aware per RN day shift reported. Pt got 1 unit of blood transfusion today.   Night shift JP drain is slowly draining, but continue to be bright red blood.   Total 24 hours JP drain and wound vac 1,125 ml. Clinical pharmacist made aware. Continue Heparin gtt at 900 units/hr with closely monitor bleeding and HH. Pt is hemodynamically stable, afebrile, NSR with occasionally PVCs. No respiratory distress noted. Pain is well tolerated with PCA Dilaudid. Pt has been rarely using PCA.   Good signals on PT and DP pulses bilateral via doppler. Right leg is 3+, left leg 2+ pitting edema.  Pt's wife and his daughter called in the evening. Update was given. All questions were answered. They expressed appreciations with care team. We will continue to monitor.  Kennyth Lose, RN

## 2021-09-18 NOTE — Progress Notes (Signed)
We received critical result Hb 5.5 this am. Dr. Carlis Abbott was notified. Order received for 2 units of PRBC transfusion.   24 hours total bright red blood drain from JP drain 950 ml and wound vac 175 ml, Total 1125 ml in 24 hours.Pharmacist made aware.  Pt is lethargic, oriented x 4. Hemodynamics stable. Continue to monitor.  Kennyth Lose, RN

## 2021-09-18 NOTE — TOC Progression Note (Signed)
Transition of Care [facility]) - Progression Note    Patient Details  Name: [name] MRN: [id number] Date of Birth: 1956-02-17  Transition of Care [facility] CM/SW Contact  Tom-Johnson, Renea Ee, RN Phone Number: 09/18/2021, 3:39 PM  Clinical Narrative:     No PT/OT f/u and DME needs noted at this time. CM will continue to follow with needs.        Expected Discharge Plan and Services                                                 Social Determinants of Health (SDOH) Interventions    Readmission Risk Interventions     No data to display

## 2021-09-19 ENCOUNTER — Other Ambulatory Visit: Payer: Self-pay

## 2021-09-19 LAB — BPAM RBC
Blood Product Expiration Date: 202306242359
Blood Product Expiration Date: 202307062359
Blood Product Expiration Date: 202307062359
Blood Product Expiration Date: 202307182359
Blood Product Expiration Date: 202307182359
ISSUE DATE / TIME: 202306171333
ISSUE DATE / TIME: 202306171333
ISSUE DATE / TIME: 202306191317
ISSUE DATE / TIME: 202306200540
ISSUE DATE / TIME: 202306200920
Unit Type and Rh: 5100
Unit Type and Rh: 5100
Unit Type and Rh: 5100
Unit Type and Rh: 5100
Unit Type and Rh: 9500

## 2021-09-19 LAB — TYPE AND SCREEN
ABO/RH(D): O POS
Antibody Screen: NEGATIVE
Unit division: 0
Unit division: 0
Unit division: 0
Unit division: 0
Unit division: 0

## 2021-09-19 LAB — CBC
HCT: 19 % — ABNORMAL LOW (ref 39.0–52.0)
Hemoglobin: 6.4 g/dL — CL (ref 13.0–17.0)
MCH: 30.9 pg (ref 26.0–34.0)
MCHC: 33.7 g/dL (ref 30.0–36.0)
MCV: 91.8 fL (ref 80.0–100.0)
Platelets: 112 10*3/uL — ABNORMAL LOW (ref 150–400)
RBC: 2.07 MIL/uL — ABNORMAL LOW (ref 4.22–5.81)
RDW: 16.8 % — ABNORMAL HIGH (ref 11.5–15.5)
WBC: 8.7 10*3/uL (ref 4.0–10.5)
nRBC: 1.3 % — ABNORMAL HIGH (ref 0.0–0.2)

## 2021-09-19 LAB — HEPARIN LEVEL (UNFRACTIONATED): Heparin Unfractionated: 0.59 IU/mL (ref 0.30–0.70)

## 2021-09-19 LAB — PREPARE RBC (CROSSMATCH)

## 2021-09-19 MED ORDER — SODIUM CHLORIDE 0.9% IV SOLUTION: Freq: Once | INTRAVENOUS | Status: AC

## 2021-09-19 NOTE — Progress Notes (Addendum)
  Progress Note    09/19/2021 8:23 AM 4 Days Post-Op  Subjective:  R leg feels tighter   Vitals:   09/19/21 0446 09/19/21 0718  BP:  (!) 124/54  Pulse:  82  Resp: 18 17  Temp:  98.1 F (36.7 C)  SpO2: 96% 97%   Physical Exam: Cardiac: RRR   Lungs:  non labored Incisions:  R leg wound vac with good seal Extremities:  brisk R DP and PT by doppler; R calf tight and thigh with pitting edema Neurologic: A&O  CBC    Component Value Date/Time   WBC 8.7 09/19/2021 0445   RBC 2.07 (L) 09/19/2021 0445   HGB 6.4 (LL) 09/19/2021 0445   HCT 19.0 (L) 09/19/2021 0445   PLT 112 (L) 09/19/2021 0445   MCV 91.8 09/19/2021 0445   MCH 30.9 09/19/2021 0445   MCHC 33.7 09/19/2021 0445   RDW 16.8 (H) 09/19/2021 0445   LYMPHSABS 818 (L) 03/08/2021 1117   MONOABS 0.8 05/11/2019 1352   EOSABS 112 03/08/2021 1117   BASOSABS 33 03/08/2021 1117    BMET    Component Value Date/Time   NA 136 09/18/2021 0415   K 3.6 09/18/2021 0415   CL 103 09/18/2021 0415   CO2 27 09/18/2021 0415   GLUCOSE 144 (H) 09/18/2021 0415   BUN 15 09/18/2021 0415   CREATININE 0.82 09/18/2021 0415   CREATININE 0.90 03/08/2021 1117   CALCIUM 7.6 (L) 09/18/2021 0415   GFRNONAA >60 09/18/2021 0415   GFRNONAA 89 11/17/2019 1258   GFRAA 104 11/17/2019 1258    INR    Component Value Date/Time   INR 1.4 (H) 09/18/2021 0415     Intake/Output Summary (Last 24 hours) at 09/19/2021 0823 Last data filed at 09/19/2021 3976 Gross per 24 hour  Intake 1598.02 ml  Output 1520 ml  Net 78.02 ml     Assessment/Plan:  66 y.o. male is s/p R femoral to AK popliteal bypass with vein followed by hematoma evacuation 4 Days Post-Op   R foot well perfused with brisk DP and PT by doppler R calf tight with pitting edema of thigh Hgb only 6.4 despite transfusion; plan is to transfuse 2 additional units and will hold heparin   Dagoberto Ligas, PA-C [facility] [phone] 09/19/2021 8:23 AM  VASCULAR  STAFF ADDENDUM: I have independently interviewed and examined the patient. I agree with the above.  Will plan to hold heparin until blood counts stabilize. Mobilize as able.   Yevonne Aline. Stanford Breed, MD [facility] Phone Number: [phone] 09/19/2021 2:28 PM

## 2021-09-19 NOTE — Progress Notes (Signed)
Physical Therapy Treatment Patient Details Name: [name] MRN: [id number] DOB: 15-Nov-1955 Today's Date: 09/19/2021   History of Present Illness Pt adm 6/16 for rt femoral popliteal bypass graft. Underwent emergent surgery 6/17 for hemorrhagic shock,  reexploration of RLE, angiogram of right lower extremity and application of wound vac  PMH - RLE ulcer, copd, cad, HTN, AVR, MVR, PVD, CVA    PT Comments    Pt received supine with HOB elevated, eager for OOB mobility. Pt able to come to sitting EOB with mod assist to elevate trunk and scoot to EOB, pt needing min assist to steady on rise to come to standing with RW and able to maintain for ~15 mins throughout session. Pt with good tolerance for standing therex for increased strength and ROM. Pt educated re; importance of maintaining neutral rotation and extension of RLE, as pt with tendency to sit with knee flexed and in external rotation at rest in bed and on importance of continued mobility with pt verbalizing understanding and able to demo back exercises to facilitate positioning. Pt continues to benefit from skilled PT services to progress toward functional mobility goals.    Recommendations for follow up therapy are one component of a multi-disciplinary discharge planning process, led by the attending physician.  Recommendations may be updated based on patient status, additional functional criteria and insurance authorization.  Follow Up Recommendations  No PT follow up     Assistance Recommended at Discharge PRN  Patient can return home with the following Assist for transportation   Equipment Recommendations  None recommended by PT    Recommendations for Other Services       Precautions / Restrictions Precautions Precautions: None Precaution Comments: wound vac; jp drain Required Braces or Orthoses: Other Brace Other Brace: scrotal sling Restrictions Weight Bearing Restrictions: Yes RLE Weight Bearing: Weight bearing as  tolerated     Mobility  Bed Mobility Overal bed mobility: Needs Assistance Bed Mobility: Supine to Sit     Supine to sit: Mod assist     General bed mobility comments: mod a to elevate trunk and scoot to EOB    Transfers Overall transfer level: Needs assistance Equipment used: Rolling walker (2 wheels) Transfers: Sit to/from Stand Sit to Stand: Min assist           General transfer comment: able to stand with min A and maintain ~15 mins then side step 4 steps to [facility] with min guard A    Ambulation/Gait               General Gait Details: unable 2/2 pain   Stairs             Wheelchair Mobility    Modified Rankin (Stroke Patients Only)       Balance Overall balance assessment: Needs assistance   Sitting balance-Leahy Scale: Fair Sitting balance - Comments: required supporta at times to hold self up due to dizziness and "tightness" in leg     Standing balance-Leahy Scale: Poor Standing balance comment: reliant on RW                            Cognition Arousal/Alertness: Awake/alert Behavior During Therapy: WFL for tasks assessed/performed Overall Cognitive Status: Within Functional Limits for tasks assessed  Exercises Other Exercises Other Exercises: marching in standing x10 ea side, mini squats x5, wegith shiting x20, static standing ~15 mins Other Exercises: long axis IR/ER RLE x10, quad set x10    General Comments General comments (skin integrity, edema, etc.): HR 83 at rest, HR max 92, O2 95% on RA      Pertinent Vitals/Pain Pain Assessment Pain Assessment: Faces Faces Pain Scale: Hurts even more Pain Location: RLE, groin Pain Descriptors / Indicators: Sore, Grimacing, Guarding Pain Intervention(s): Monitored during session, Limited activity within patient's tolerance, Repositioned    Home Living                          Prior Function             PT Goals (current goals can now be found in the care plan section) Acute Rehab PT Goals Patient Stated Goal: return home PT Goal Formulation: With patient Time For Goal Achievement: 09/18/21    Frequency    Min 3X/week      PT Plan      Co-evaluation              AM-PAC PT "6 Clicks" Mobility   Outcome Measure  Help needed turning from your back to your side while in a flat bed without using bedrails?: A Little Help needed moving from lying on your back to sitting on the side of a flat bed without using bedrails?: A Little Help needed moving to and from a bed to a chair (including a wheelchair)?: A Little Help needed standing up from a chair using your arms (e.g., wheelchair or bedside chair)?: A Little Help needed to walk in hospital room?: A Little Help needed climbing 3-5 steps with a railing? : A Little 6 Click Score: 18    End of Session   Activity Tolerance: Patient tolerated treatment well Patient left: in bed;with call bell/phone within reach (seated EOB, RN notified) Nurse Communication: Mobility status PT Visit Diagnosis: Other abnormalities of gait and mobility (R26.89);Pain Pain - Right/Left: Right Pain - part of body: Leg;Knee     Time: 8469-6295 PT Time Calculation (min) (ACUTE ONLY): 33 min  Charges:  $Therapeutic Exercise: 8-22 mins $Therapeutic Activity: 8-22 mins                     Zuleima Haser R. PTA Acute Rehabilitation Services Office: [place] 09/19/2021, 11:03 AM

## 2021-09-19 NOTE — Progress Notes (Signed)
Occupational Therapy Treatment Patient Details Name: [name] MRN: [id number] DOB: Aug 27, 1955 Today's Date: 09/19/2021   History of present illness Pt adm 6/16 for rt femoral popliteal bypass graft. Underwent emergent surgery 6/17 for hemorrhagic shock,  reexploration of RLE, angiogram of right lower extremity and application of wound vac  PMH - RLE ulcer, copd, cad, HTN, AVR, MVR, PVD, CVA   OT comments  Patient continues to give good effort, and should progress with patient focused goals as swelling and pain lessen. Bulk of the session was sit to stand, and small steps forward/back and side to side.  Deficits listed below remain.  OT will follow in the acute setting.     Recommendations for follow up therapy are one component of a multi-disciplinary discharge planning process, led by the attending physician.  Recommendations may be updated based on patient status, additional functional criteria and insurance authorization.    Follow Up Recommendations  No OT follow up    Assistance Recommended at Discharge Intermittent Supervision/Assistance  Patient can return home with the following  A little help with bathing/dressing/bathroom;Assistance with cooking/housework;Assist for transportation   Equipment Recommendations  BSC/3in1    Recommendations for Other Services      Precautions / Restrictions Precautions Precautions: None Precaution Comments: wound vac; jp drain Required Braces or Orthoses: Other Brace Other Brace: scrotal sling Restrictions RLE Weight Bearing: Weight bearing as tolerated       Mobility Bed Mobility Overal bed mobility: Needs Assistance Bed Mobility: Supine to Sit, Sit to Supine     Supine to sit: Min assist, Mod assist Sit to supine: Min assist, Mod assist        Transfers Overall transfer level: Needs assistance Equipment used: Rolling walker (2 wheels) Transfers: Sit to/from Stand Sit to Stand: Min assist                 Balance  Overall balance assessment: Needs assistance Sitting-balance support: Feet supported, Bilateral upper extremity supported Sitting balance-Leahy Scale: Fair     Standing balance support: Reliant on assistive device for balance Standing balance-Leahy Scale: Poor                                                                                                                                  Pertinent Vitals/ Pain       Pain Assessment Pain Assessment: Faces Faces Pain Scale: Hurts little more Pain Location: RLE, groin Pain Descriptors / Indicators: Guarding, Grimacing, Heaviness Pain Intervention(s): Monitored during session                                                          Frequency  Min 2X/week        Progress Toward Goals  OT Goals(current goals can  now be found in the care plan section)  Progress towards OT goals: Progressing toward goals  Acute Rehab OT Goals OT Goal Formulation: With patient Time For Goal Achievement: 10/01/21 Potential to Achieve Goals: Good  Plan Discharge plan remains appropriate    Co-evaluation                 AM-PAC OT "6 Clicks" Daily Activity     Outcome Measure   Help from another person eating meals?: None Help from another person taking care of personal grooming?: None Help from another person toileting, which includes using toliet, bedpan, or urinal?: A Lot Help from another person bathing (including washing, rinsing, drying)?: A Lot Help from another person to put on and taking off regular upper body clothing?: A Little Help from another person to put on and taking off regular lower body clothing?: A Lot 6 Click Score: 17    End of Session Equipment Utilized During Treatment: Rolling walker (2 wheels)  OT Visit Diagnosis: Unsteadiness on feet (R26.81);Other abnormalities of gait and mobility (R26.89);Muscle weakness (generalized)  (M62.81);Pain Pain - Right/Left: Right Pain - part of body: Leg   Activity Tolerance Patient tolerated treatment well   Patient Left in bed;with call bell/phone within reach;with family/visitor present   Nurse Communication Other (comment) (cleared to see)        Time: 7948-0165 OT Time Calculation (min): 19 min  Charges: OT General Charges $OT Visit: 1 Visit OT Treatments $Therapeutic Activity: 8-22 mins  09/19/2021  RP, OTR/L  Acute Rehabilitation Services  Office:  [phone]   Metta Clines 09/19/2021, 3:48 PM

## 2021-09-19 NOTE — Progress Notes (Signed)
    09/19/21 0932  Provider Notification  Provider Name/Title Dr. Scot Dock  Date Provider Notified 09/19/21  Time Provider Notified [phone]  Method of Notification Page;Call  Notification Reason Critical result  Test performed and critical result Hb  Date Critical Result Received 09/19/21  Time Critical Result Received 0648  Provider response See new orders--> 2 units of PRBC transfusion.  Date of Provider Response 09/19/21  Time of Provider Response 3220   Kennyth Lose, RN

## 2021-09-20 ENCOUNTER — Other Ambulatory Visit: Payer: Self-pay

## 2021-09-20 LAB — CBC
HCT: 22.8 % — ABNORMAL LOW (ref 39.0–52.0)
Hemoglobin: 7.7 g/dL — ABNORMAL LOW (ref 13.0–17.0)
MCH: 30.9 pg (ref 26.0–34.0)
MCHC: 33.8 g/dL (ref 30.0–36.0)
MCV: 91.6 fL (ref 80.0–100.0)
Platelets: 122 10*3/uL — ABNORMAL LOW (ref 150–400)
RBC: 2.49 MIL/uL — ABNORMAL LOW (ref 4.22–5.81)
RDW: 17 % — ABNORMAL HIGH (ref 11.5–15.5)
WBC: 8.7 10*3/uL (ref 4.0–10.5)
nRBC: 1.3 % — ABNORMAL HIGH (ref 0.0–0.2)

## 2021-09-20 LAB — HEMOGLOBIN AND HEMATOCRIT, BLOOD
HCT: 22 % — ABNORMAL LOW (ref 39.0–52.0)
Hemoglobin: 7.3 g/dL — ABNORMAL LOW (ref 13.0–17.0)

## 2021-09-20 MED ORDER — OXYCODONE-ACETAMINOPHEN 5-325 MG PO TABS
1.0000 | ORAL_TABLET | ORAL | Status: DC | PRN
  Administered 2021-09-22 – 2021-10-09 (×4): 2 via ORAL
  Filled 2021-09-20 (×6): qty 2

## 2021-09-20 MED ORDER — HYDROMORPHONE HCL 1 MG/ML IJ SOLN
0.5000 mg | INTRAMUSCULAR | Status: DC | PRN
  Administered 2021-09-22 – 2021-10-08 (×2): 1 mg via INTRAVENOUS
  Filled 2021-09-20 (×3): qty 1

## 2021-09-20 NOTE — Plan of Care (Signed)

## 2021-09-20 NOTE — Progress Notes (Signed)
Wound vac repeatedly alarming 2/2 detected leak. After assessing dressing, there is a small area on the lower+medial aspect of dressing that is unable to completely seal 2/2 to the JP drain tubing. Pressure dressing applied to this small area, leak improved.

## 2021-09-20 NOTE — Progress Notes (Signed)
Mobility Specialist: Progress Note   09/20/21 1017  Mobility  Activity Ambulated with assistance in room  Level of Assistance Contact guard assist, steadying assist  Assistive Device Front wheel walker  RLE Weight Bearing WBAT  Distance Ambulated (ft) 24 ft  Activity Response Tolerated well  $Mobility charge 1 Mobility   Pre-Mobility: 78 HR, 105/55 (71) BP, 98% SpO2 Post-Mobility: 80 HR  Pt received in the bed and agreeable to mobility. C/o mild dizziness upon sitting, resolved quickly. C/o mild RLE pain as well as scrotal discomfort during session. Pt to the chair after ambulation and set up at the sink with NT present in the room.   [facility] Shakerria Parran Mobility Specialist Mobility Specialist [address]: [phone]

## 2021-09-20 NOTE — Care Management Important Message (Signed)
Important Message  Patient Details  Name: Vester Krout MRN: [id number] Date of Birth: [birth year]   Medicare Important Message Given:  Yes     Renie Ora 09/20/2021, 8:22 AM

## 2021-09-21 LAB — CBC
HCT: 22.5 % — ABNORMAL LOW (ref 39.0–52.0)
Hemoglobin: 7.2 g/dL — ABNORMAL LOW (ref 13.0–17.0)
MCH: 29.9 pg (ref 26.0–34.0)
MCHC: 32 g/dL (ref 30.0–36.0)
MCV: 93.4 fL (ref 80.0–100.0)
Platelets: 139 10*3/uL — ABNORMAL LOW (ref 150–400)
RBC: 2.41 MIL/uL — ABNORMAL LOW (ref 4.22–5.81)
RDW: 17.3 % — ABNORMAL HIGH (ref 11.5–15.5)
WBC: 9 10*3/uL (ref 4.0–10.5)
nRBC: 0.6 % — ABNORMAL HIGH (ref 0.0–0.2)

## 2021-09-21 LAB — HEPARIN LEVEL (UNFRACTIONATED): Heparin Unfractionated: <0.1 IU/mL — ABNORMAL LOW (ref 0.30–0.70)

## 2021-09-21 MED ORDER — HEPARIN (PORCINE) 25000 UT/250ML-% IV SOLN
1300.0000 [IU]/h | INTRAVENOUS | Status: DC
  Administered 2021-09-22: 1150 [IU]/h via INTRAVENOUS
  Administered 2021-09-23: 1300 [IU]/h via INTRAVENOUS
  Administered 2021-09-24: 1450 [IU]/h via INTRAVENOUS
  Administered 2021-09-25: 1300 [IU]/h via INTRAVENOUS
  Filled 2021-09-21 (×5): qty 250

## 2021-09-21 MED ORDER — FUROSEMIDE 10 MG/ML IJ SOLN
40.0000 mg | Freq: Once | INTRAMUSCULAR | Status: AC
Start: 2021-09-21 — End: 2021-09-21
  Administered 2021-09-21: 40 mg via INTRAVENOUS
  Filled 2021-09-21: qty 4

## 2021-09-21 MED ORDER — HEPARIN (PORCINE) 25000 UT/250ML-% IV SOLN
700.0000 [IU]/h | INTRAVENOUS | Status: DC
  Administered 2021-09-21: 700 [IU]/h via INTRAVENOUS
  Filled 2021-09-21: qty 250

## 2021-09-21 NOTE — Progress Notes (Addendum)
  Progress Note    09/21/2021 7:45 AM 6 Days Post-Op  Subjective:  R leg continues to feel better and less swollen   Vitals:   09/21/21 0354 09/21/21 0729  BP: (!) 123/46 (!) 120/56  Pulse: 81 79  Resp: 19 18  Temp: 98.1 F (36.7 C) 98.6 F (37 C)  SpO2: 97% 97%   Physical Exam: Lungs:  non labored Incisions:  prevena in place with good seal Extremities:  brisk R DP and PT by doppler Neurologic: A&O  CBC    Component Value Date/Time   WBC 9.0 09/21/2021 0635   RBC 2.41 (L) 09/21/2021 0635   HGB 7.2 (L) 09/21/2021 0635   HCT 22.5 (L) 09/21/2021 0635   PLT 139 (L) 09/21/2021 0635   MCV 93.4 09/21/2021 0635   MCH 29.9 09/21/2021 0635   MCHC 32.0 09/21/2021 0635   RDW 17.3 (H) 09/21/2021 0635   LYMPHSABS 818 (L) 03/08/2021 1117   MONOABS 0.8 05/11/2019 1352   EOSABS 112 03/08/2021 1117   BASOSABS 33 03/08/2021 1117    BMET    Component Value Date/Time   NA 136 09/18/2021 0415   K 3.6 09/18/2021 0415   CL 103 09/18/2021 0415   CO2 27 09/18/2021 0415   GLUCOSE 144 (H) 09/18/2021 0415   BUN 15 09/18/2021 0415   CREATININE 0.82 09/18/2021 0415   CREATININE 0.90 03/08/2021 1117   CALCIUM 7.6 (L) 09/18/2021 0415   GFRNONAA >60 09/18/2021 0415   GFRNONAA 89 11/17/2019 1258   GFRAA 104 11/17/2019 1258    INR    Component Value Date/Time   INR 1.4 (H) 09/18/2021 0415     Intake/Output Summary (Last 24 hours) at 09/21/2021 0745 Last data filed at 09/21/2021 0600 Gross per 24 hour  Intake 834 ml  Output 1065 ml  Net -231 ml     Assessment/Plan:  66 y.o. male is s/p R femoral to AK popliteal bypass with vein and subsequent hematoma evacuation  6 Days Post-Op   -R leg well perfused with brisk PT and DP by doppler; subjectively edema continues to improve -Can probably remove prevena vac if there continues to be problems obtaining a seal -H&H stable; we will restart heparin today and follow H&H for any further sign of bleeding   Emilie Rutter,  PA-C [facility] [phone] 09/21/2021 7:45 AM  VASCULAR STAFF ADDENDUM: I have independently interviewed and examined the patient. I agree with the above.  Doing well overall. H/H stable over past 24h. Plan to reintroduce heparin drip slowly today. Monitor drain output. If output stays down and H/H reasonably stable, plan remove Prevena and JP drain this weekend.  Rande Brunt. Lenell Antu, MD [facility] Phone Number: [phone] 09/21/2021 4:36 PM

## 2021-09-21 NOTE — Progress Notes (Addendum)
ANTICOAGULATION CONSULT NOTE  Pharmacy Consult for Heparin Indication: atrial fibrillation and mechanical MVR   Patient Measurements: Height: 5\' 8"  (172.7 cm) Weight: 88.5 kg (195 lb) IBW/kg (Calculated) : 68.4 Heparin Dosing Weight: 86 kg  Vital Signs: Temp: 98.6 F (37 C) (06/23 2000) Temp Source: Oral (06/23 2000) BP: 111/50 (06/23 2000) Pulse Rate: 85 (06/23 2000)  Labs: Recent Labs    09/19/21 0445 09/19/21 2351 09/20/21 0806 09/21/21 0635 09/21/21 2054  HGB 6.4* 7.3* 7.7* 7.2*  --   HCT 19.0* 22.0* 22.8* 22.5*  --   PLT 112*  --  122* 139*  --   HEPARINUNFRC 0.59  --   --   --  <0.10*     Estimated Creatinine Clearance: 97.1 mL/min (by C-G formula based on SCr of 0.82 mg/dL).   Assessment: 66 y.o. male on warfarin PTA for mechanical MVF/AVR, hx afib s/p R femoral to AK popliteal bypass with vein and subsequent hematoma evacuation 6/17. Pharmacy consulted to resume heparin slowly today per VVS. Warfarin PTA remains on hold.  Heparin level undetectable tonight. Previously therapeutic on 900 units/hr at 0.59. Hg down to 7.2, plt up to 139. No bleeding or issues with infusion per discussion with RN.  Goal of Therapy:  Heparin level 0.3-0.7 units/ml Monitor platelets by anticoagulation protocol: Yes   Plan: No bolus per VVS. Increase heparin to 850 units/hr (conservative increase) Check 6hr heparin level Monitor daily CBC, s/sx bleeding   Leia Alf, PharmD, BCPS Please check AMION for all [facility] contact numbers Clinical Pharmacist 09/21/2021 9:28 PM

## 2021-09-22 LAB — CBC
HCT: 22.7 % — ABNORMAL LOW (ref 39.0–52.0)
HCT: 24.5 % — ABNORMAL LOW (ref 39.0–52.0)
Hemoglobin: 7 g/dL — ABNORMAL LOW (ref 13.0–17.0)
Hemoglobin: 8.1 g/dL — ABNORMAL LOW (ref 13.0–17.0)
MCH: 29.7 pg (ref 26.0–34.0)
MCH: 30.8 pg (ref 26.0–34.0)
MCHC: 30.8 g/dL (ref 30.0–36.0)
MCHC: 33.1 g/dL (ref 30.0–36.0)
MCV: 96.2 fL (ref 80.0–100.0)
MCV: 93.2 fL (ref 80.0–100.0)
Platelets: 166 10*3/uL (ref 150–400)
Platelets: 172 10*3/uL (ref 150–400)
RBC: 2.36 MIL/uL — ABNORMAL LOW (ref 4.22–5.81)
RBC: 2.63 MIL/uL — ABNORMAL LOW (ref 4.22–5.81)
RDW: 17.8 % — ABNORMAL HIGH (ref 11.5–15.5)
RDW: 16.9 % — ABNORMAL HIGH (ref 11.5–15.5)
WBC: 9.9 10*3/uL (ref 4.0–10.5)
WBC: 10.5 10*3/uL (ref 4.0–10.5)
nRBC: 0.3 % — ABNORMAL HIGH (ref 0.0–0.2)
nRBC: 0.2 % (ref 0.0–0.2)

## 2021-09-22 LAB — HEPARIN LEVEL (UNFRACTIONATED)
Heparin Unfractionated: 0.13 IU/mL — ABNORMAL LOW (ref 0.30–0.70)
Heparin Unfractionated: 0.13 IU/mL — ABNORMAL LOW (ref 0.30–0.70)

## 2021-09-22 LAB — PREPARE RBC (CROSSMATCH)

## 2021-09-22 MED ORDER — FUROSEMIDE 10 MG/ML IJ SOLN
40.0000 mg | Freq: Once | INTRAMUSCULAR | Status: AC
Start: 2021-09-22 — End: 2021-09-22
  Administered 2021-09-22: 40 mg via INTRAVENOUS
  Filled 2021-09-22: qty 4

## 2021-09-22 NOTE — Progress Notes (Signed)
ANTICOAGULATION CONSULT NOTE  Pharmacy Consult for Heparin Indication: atrial fibrillation and mechanical MVR   Patient Measurements: Height: 5\' 8"  (172.7 cm) Weight: 88.5 kg (195 lb) IBW/kg (Calculated) : 68.4 Heparin Dosing Weight: 86 kg  Vital Signs: Temp: 98.5 F (36.9 C) (06/24 1429) Temp Source: Oral (06/24 1429) BP: 117/52 (06/24 1635) Pulse Rate: 84 (06/24 1635)  Labs: Recent Labs    09/21/21 0635 09/21/21 2054 09/22/21 0424 09/22/21 1543 09/22/21 1555  HGB 7.2*  --  7.0*  --  8.1*  HCT 22.5*  --  22.7*  --  24.5*  PLT 139*  --  166  --  172  HEPARINUNFRC  --  <0.10* 0.13* 0.13*  --      Estimated Creatinine Clearance: 97.1 mL/min (by C-G formula based on SCr of 0.82 mg/dL).   Assessment: 66 y.o. male on warfarin PTA for mechanical MVF/AVR, hx afib s/p R femoral to AK popliteal bypass with vein and subsequent hematoma evacuation 6/17. Pharmacy consulted to resume heparin slowly today per VVS. Warfarin PTA remains on hold.  Heparin level still below goal and unchanged after rate adjustment this morning. No bleeding or issues with infusion noted. Hemoglobin up to 8.1 after a until of blood this morning.   Goal of Therapy:  Heparin level 0.3-0.7 units/ml Monitor platelets by anticoagulation protocol: Yes   Plan: No bolus per VVS. Increase heparin to 1150 units/hr (conservative increase) Recheck 8hr heparin level Monitor daily CBC, s/sx bleeding  Sheppard Coil PharmD., BCPS Clinical Pharmacist 09/22/2021 4:42 PM

## 2021-09-22 NOTE — Progress Notes (Addendum)
  Progress Note    09/22/2021 8:55 AM 7 Days Post-Op  Subjective:  R leg still feeling tight   Vitals:   09/22/21 0405 09/22/21 0728  BP: (!) 115/53 (!) 119/46  Pulse: 75 76  Resp: 19 18  Temp: 97.9 F (36.6 C) 98.7 F (37.1 C)  SpO2: 96% 96%   Physical Exam: Lungs:  non labored Incisions:  Prevena with good seal R leg; popliteal incision c/d/i Extremities:  brisk ATA and PTA by doppler Neurologic: A&O  CBC    Component Value Date/Time   WBC 9.9 09/22/2021 0424   RBC 2.36 (L) 09/22/2021 0424   HGB 7.0 (L) 09/22/2021 0424   HCT 22.7 (L) 09/22/2021 0424   PLT 166 09/22/2021 0424   MCV 96.2 09/22/2021 0424   MCH 29.7 09/22/2021 0424   MCHC 30.8 09/22/2021 0424   RDW 17.8 (H) 09/22/2021 0424   LYMPHSABS 818 (L) 03/08/2021 1117   MONOABS 0.8 05/11/2019 1352   EOSABS 112 03/08/2021 1117   BASOSABS 33 03/08/2021 1117    BMET    Component Value Date/Time   NA 136 09/18/2021 0415   K 3.6 09/18/2021 0415   CL 103 09/18/2021 0415   CO2 27 09/18/2021 0415   GLUCOSE 144 (H) 09/18/2021 0415   BUN 15 09/18/2021 0415   CREATININE 0.82 09/18/2021 0415   CREATININE 0.90 03/08/2021 1117   CALCIUM 7.6 (L) 09/18/2021 0415   GFRNONAA >60 09/18/2021 0415   GFRNONAA 89 11/17/2019 1258   GFRAA 104 11/17/2019 1258    INR    Component Value Date/Time   INR 1.4 (H) 09/18/2021 0415     Intake/Output Summary (Last 24 hours) at 09/22/2021 0855 Last data filed at 09/22/2021 2952 Gross per 24 hour  Intake 267.48 ml  Output 2360 ml  Net -2092.52 ml     Assessment/Plan:  66 y.o. male is s/p R femoral to AK popliteal bypass with vein and subsequent hematoma evacuation 7 Days Post-Op   R leg well perfused with brisk PT and DP signal by doppler Heparin restarted yesterday; slight drop in H&H and subjective increase in edema of R leg.  We will transfuse 1 unit pRBCs.  Considering CTA of R leg to see if there is any identifiable source of blood loss Continue prevena Will give  lasix again today OOB when able   Emilie Rutter, PA-C [facility] [phone] 09/22/2021 8:55 AM  I have seen and evaluated the patient. I agree with the PA note as documented above.  Patient had a right femoropopliteal bypass complicated by postoperative hypotension with hematoma requiring exploration in the OR last weekend.  Overall he feels his thigh is much improved from last week although he has a fair amount of swelling on exam.  Hemoglobin today 7.0 from 7.2 yesterday.  We will give 1 unit of packed red blood cells given cardiac history with mechanical heart valve.  He was restarted on heparin yesterday by Dr. Lenell Antu.  I think if he drops his hemoglobin again likely will need a CT scan tomorrow to evaluate for any ongoing source of bleeding or other hematoma that needs evacuation.  Significant scrotal edema and will give another dose of Lasix today.  States he walked in the room yesterday.  We will leave VAC and drain for now especially since heparin just restarted. Brisk DP signal right foot after bypass.    Cephus Shelling, MD [facility] [place] Office: [phone]

## 2021-09-23 LAB — CBC
HCT: 24.4 % — ABNORMAL LOW (ref 39.0–52.0)
Hemoglobin: 8 g/dL — ABNORMAL LOW (ref 13.0–17.0)
MCH: 31 pg (ref 26.0–34.0)
MCHC: 32.8 g/dL (ref 30.0–36.0)
MCV: 94.6 fL (ref 80.0–100.0)
Platelets: 184 10*3/uL (ref 150–400)
RBC: 2.58 MIL/uL — ABNORMAL LOW (ref 4.22–5.81)
RDW: 17.1 % — ABNORMAL HIGH (ref 11.5–15.5)
WBC: 10.4 10*3/uL (ref 4.0–10.5)
nRBC: 0.3 % — ABNORMAL HIGH (ref 0.0–0.2)

## 2021-09-23 LAB — BASIC METABOLIC PANEL
Anion gap: 6 (ref 5–15)
BUN: 18 mg/dL (ref 8–23)
CO2: 31 mmol/L (ref 22–32)
Calcium: 8 mg/dL — ABNORMAL LOW (ref 8.9–10.3)
Chloride: 98 mmol/L (ref 98–111)
Creatinine, Ser: 0.87 mg/dL (ref 0.61–1.24)
GFR, Estimated: >60 mL/min (ref >60)
Glucose, Bld: 161 mg/dL — ABNORMAL HIGH (ref 70–99)
Potassium: 3.3 mmol/L — ABNORMAL LOW (ref 3.5–5.1)
Sodium: 135 mmol/L (ref 135–145)

## 2021-09-23 LAB — HEPARIN LEVEL (UNFRACTIONATED)
Heparin Unfractionated: 0.28 IU/mL — ABNORMAL LOW (ref 0.30–0.70)
Heparin Unfractionated: 0.44 IU/mL (ref 0.30–0.70)
Heparin Unfractionated: 0.19 IU/mL — ABNORMAL LOW (ref 0.30–0.70)
Heparin Unfractionated: <0.1 IU/mL — ABNORMAL LOW (ref 0.30–0.70)

## 2021-09-23 LAB — TYPE AND SCREEN
ABO/RH(D): O POS
Antibody Screen: NEGATIVE
Unit division: 0
Unit division: 0
Unit division: 0

## 2021-09-23 LAB — BPAM RBC
Blood Product Expiration Date: 202306282359
Blood Product Expiration Date: 202307182359
Blood Product Expiration Date: 202307182359
ISSUE DATE / TIME: 202306211346
ISSUE DATE / TIME: 202306211700
ISSUE DATE / TIME: 202306241129
Unit Type and Rh: 5100
Unit Type and Rh: 5100
Unit Type and Rh: 5100

## 2021-09-23 MED ORDER — FUROSEMIDE 10 MG/ML IJ SOLN
40.0000 mg | Freq: Once | INTRAMUSCULAR | Status: AC
  Administered 2021-09-23: 40 mg via INTRAVENOUS
  Filled 2021-09-23: qty 4

## 2021-09-23 NOTE — Progress Notes (Signed)
Mobility Specialist Progress Note:   09/23/21 1000  Mobility  Activity Ambulated with assistance in room  Level of Assistance Minimal assist, patient does 75% or more  Assistive Device Front wheel walker  RLE Weight Bearing WBAT  Distance Ambulated (ft) 10 ft  Activity Response Tolerated well  $Mobility charge 1 Mobility   Pt received in bed willing to participate in mobility. Complaints of LLE pain. Left in bed with call bell in reach and all needs met.   [facility] Clarissa Laird Mobility Specialist

## 2021-09-24 LAB — CBC
HCT: 25 % — ABNORMAL LOW (ref 39.0–52.0)
Hemoglobin: 7.8 g/dL — ABNORMAL LOW (ref 13.0–17.0)
MCH: 29.7 pg (ref 26.0–34.0)
MCHC: 31.2 g/dL (ref 30.0–36.0)
MCV: 95.1 fL (ref 80.0–100.0)
Platelets: 205 10*3/uL (ref 150–400)
RBC: 2.63 MIL/uL — ABNORMAL LOW (ref 4.22–5.81)
RDW: 16.7 % — ABNORMAL HIGH (ref 11.5–15.5)
WBC: 9.9 10*3/uL (ref 4.0–10.5)
nRBC: 0 % (ref 0.0–0.2)

## 2021-09-24 LAB — HEPARIN LEVEL (UNFRACTIONATED)
Heparin Unfractionated: 0.36 IU/mL (ref 0.30–0.70)
Heparin Unfractionated: <0.1 IU/mL — ABNORMAL LOW (ref 0.30–0.70)
Heparin Unfractionated: 0.34 IU/mL (ref 0.30–0.70)

## 2021-09-24 LAB — BASIC METABOLIC PANEL
Anion gap: 7 (ref 5–15)
BUN: 17 mg/dL (ref 8–23)
CO2: 29 mmol/L (ref 22–32)
Calcium: 8 mg/dL — ABNORMAL LOW (ref 8.9–10.3)
Chloride: 96 mmol/L — ABNORMAL LOW (ref 98–111)
Creatinine, Ser: 0.8 mg/dL (ref 0.61–1.24)
GFR, Estimated: >60 mL/min (ref >60)
Glucose, Bld: 119 mg/dL — ABNORMAL HIGH (ref 70–99)
Potassium: 3.8 mmol/L (ref 3.5–5.1)
Sodium: 132 mmol/L — ABNORMAL LOW (ref 135–145)

## 2021-09-24 MED ORDER — WARFARIN - PHARMACIST DOSING INPATIENT: Freq: Every day | Status: DC

## 2021-09-24 MED ORDER — WARFARIN SODIUM 2 MG PO TABS
7.0000 mg | ORAL_TABLET | Freq: Once | ORAL | Status: AC
  Administered 2021-09-24: 7 mg via ORAL
  Filled 2021-09-24: qty 1

## 2021-09-24 NOTE — Progress Notes (Signed)
  Progress Note    09/24/2021 1:03 PM 9 Days Post-Op  Subjective: Still having right leg pain but progressing  Vitals:   09/24/21 0504 09/24/21 0743  BP: (!) 120/57 (!) 104/55  Pulse: 74 74  Resp: 19 18  Temp: 98.3 F (36.8 C) 98.2 F (36.8 C)  SpO2: 94% 95%    Physical Exam: Awake alert oriented Nonlabored respirations Right lower extremity with stable swelling according to previous notes Stable scrotal swelling and ecchymosis  CBC    Component Value Date/Time   WBC 9.9 09/24/2021 0500   RBC 2.63 (L) 09/24/2021 0500   HGB 7.8 (L) 09/24/2021 0500   HCT 25.0 (L) 09/24/2021 0500   PLT 205 09/24/2021 0500   MCV 95.1 09/24/2021 0500   MCH 29.7 09/24/2021 0500   MCHC 31.2 09/24/2021 0500   RDW 16.7 (H) 09/24/2021 0500   LYMPHSABS 818 (L) 03/08/2021 1117   MONOABS 0.8 05/11/2019 1352   EOSABS 112 03/08/2021 1117   BASOSABS 33 03/08/2021 1117    BMET    Component Value Date/Time   NA 132 (L) 09/24/2021 0500   K 3.8 09/24/2021 0500   CL 96 (L) 09/24/2021 0500   CO2 29 09/24/2021 0500   GLUCOSE 119 (H) 09/24/2021 0500   BUN 17 09/24/2021 0500   CREATININE 0.80 09/24/2021 0500   CREATININE 0.90 03/08/2021 1117   CALCIUM 8.0 (L) 09/24/2021 0500   GFRNONAA >60 09/24/2021 0500   GFRNONAA 89 11/17/2019 1258   GFRAA 104 11/17/2019 1258    INR    Component Value Date/Time   INR 1.4 (H) 09/18/2021 0415     Intake/Output Summary (Last 24 hours) at 09/24/2021 1303 Last data filed at 09/24/2021 0510 Gross per 24 hour  Intake 845.65 ml  Output 1000 ml  Net -154.35 ml     Assessment/plan:  66 y.o. male is s/p right femoropopliteal bypass for mixed venous and arterial disease with takeback for bleeding.  He continues on heparin drip.  We are improving mobilization and now that H&H stabilized we can restart Coumadin.   Coline Calkin C. Randie Heinz, MD [facility] [place] Office: [phone] Pager: [phone]  09/24/2021 1:03 PM

## 2021-09-24 NOTE — Progress Notes (Addendum)
ANTICOAGULATION CONSULT NOTE  Pharmacy Consult for Heparin and Warfarin Indication: atrial fibrillation and mechanical MVR  Not on File  Patient Measurements: Height: 5\' 8"  (172.7 cm) Weight: 88.5 kg (195 lb) IBW/kg (Calculated) : 68.4  Heparin Dosing Weight: 86 kg  Vital Signs: Temp: 98.7 F (37.1 C) (06/24 0728) Temp Source: Oral (06/24 0728) BP: 119/46 (06/24 0728) Pulse Rate: 76 (06/24 0728)  Labs: Recent Labs    09/20/21 0806 09/21/21 0635 09/21/21 2054 09/22/21 0424  HGB 7.7* 7.2*  --  7.0*  HCT 22.8* 22.5*  --  22.7*  PLT 122* 139*  --  166  HEPARINUNFRC  --   --  <0.10* 0.13*    Estimated Creatinine Clearance: 97.1 mL/min (by C-G formula based on SCr of 0.82 mg/dL).   Assessment: 66 y.o. male on warfarin PTA for mechanical MVF/AVR, hx afib s/p R femoral to AK popliteal bypass with vein and subsequent hematoma evacuation 6/17. Warfarin PTA remains on hold. Pharmacy consulted to resume heparin slowly 6/23 per VVS and resume PTA warfarin.   PTA warfarin regimen (per 6/08 anticoagulation note): 7 mg Mon/Wed/Fri 6.5 mg all other days    Heparin previously therapeutic on 900 units/hr at 0.59. Received x1 pRBC 6/24.  Hgb now 7.8, platelets 205.   Heparin level now undetectable at <0.10. Previously therapeutic at 0.36 earlier this morning at same heparin rate of 1450 units/hr. Discussed with RN. Heparin infusion charted as paused at 10:36. Per RN, heparin running at 12:37 when heparin level was drawn, no issues with line, no signs bleeding reported. Will check another heparin level and resume PTA warfarin at PTA regimen.   Goal of Therapy:  Heparin level 0.3-0.7 units/ml  INR 2.5-3.5 Monitor platelets by anticoagulation protocol: Yes   Plan:  Give warfarin 7 mg PO x1 dose  Continue heparin infusion at 1450 units/hr  Check INR daily while on warfarin Check heparin level in 6 hours and daily while on heparin Continue to monitor H&H and  platelets   Addendum: Update from RN, heparin infusion has not been running since most likely when disconnected at ~10:30. Restarted around 14:30. Will check heparin level in 6 hours   Thank you for allowing pharmacy to be a part of this patient's care.  Thelma Barge, PharmD Clinical Pharmacist

## 2021-09-24 NOTE — Progress Notes (Signed)
  Progress Note    09/24/2021 1:01 PM 9 Days Post-Op  Subjective:  no complaints   Vitals:   09/24/21 0504 09/24/21 0743  BP: (!) 120/57 (!) 104/55  Pulse: 74 74  Resp: 19 18  Temp: 98.3 F (36.8 C) 98.2 F (36.8 C)  SpO2: 94% 95%   Physical Exam: Lungs:  non labored Incisions:  serosanguinous collection on dressing from thigh incision Extremities:  ATA and PT by doppler Neurologic: A&O  CBC    Component Value Date/Time   WBC 9.9 09/24/2021 0500   RBC 2.63 (L) 09/24/2021 0500   HGB 7.8 (L) 09/24/2021 0500   HCT 25.0 (L) 09/24/2021 0500   PLT 205 09/24/2021 0500   MCV 95.1 09/24/2021 0500   MCH 29.7 09/24/2021 0500   MCHC 31.2 09/24/2021 0500   RDW 16.7 (H) 09/24/2021 0500   LYMPHSABS 818 (L) 03/08/2021 1117   MONOABS 0.8 05/11/2019 1352   EOSABS 112 03/08/2021 1117   BASOSABS 33 03/08/2021 1117    BMET    Component Value Date/Time   NA 132 (L) 09/24/2021 0500   K 3.8 09/24/2021 0500   CL 96 (L) 09/24/2021 0500   CO2 29 09/24/2021 0500   GLUCOSE 119 (H) 09/24/2021 0500   BUN 17 09/24/2021 0500   CREATININE 0.80 09/24/2021 0500   CREATININE 0.90 03/08/2021 1117   CALCIUM 8.0 (L) 09/24/2021 0500   GFRNONAA >60 09/24/2021 0500   GFRNONAA 89 11/17/2019 1258   GFRAA 104 11/17/2019 1258    INR    Component Value Date/Time   INR 1.4 (H) 09/18/2021 0415     Intake/Output Summary (Last 24 hours) at 09/24/2021 1301 Last data filed at 09/24/2021 0510 Gross per 24 hour  Intake 845.65 ml  Output 1000 ml  Net -154.35 ml     Assessment/Plan:  66 y.o. male is s/p R femoral to AK popliteal bypass with vein and subsequent hematoma evacuation   9 Days Post-Op   R foot remains well perfused Continue xeroform and kerlex dressing changes daily H&H stable; continue heparin; will discuss resuming coumadin with attending    Emilie Rutter, PA-C [facility] [phone] 09/24/2021 1:01 PM

## 2021-09-25 ENCOUNTER — Inpatient Hospital Stay (HOSPITAL_COMMUNITY): Payer: Medicare Other

## 2021-09-25 LAB — CBC
HCT: 23.3 % — ABNORMAL LOW (ref 39.0–52.0)
HCT: 26.2 % — ABNORMAL LOW (ref 39.0–52.0)
Hemoglobin: 7.5 g/dL — ABNORMAL LOW (ref 13.0–17.0)
Hemoglobin: 7.9 g/dL — ABNORMAL LOW (ref 13.0–17.0)
MCH: 31 pg (ref 26.0–34.0)
MCH: 29.7 pg (ref 26.0–34.0)
MCHC: 32.2 g/dL (ref 30.0–36.0)
MCHC: 30.2 g/dL (ref 30.0–36.0)
MCV: 96.3 fL (ref 80.0–100.0)
MCV: 98.5 fL (ref 80.0–100.0)
Platelets: 200 10*3/uL (ref 150–400)
Platelets: 231 10*3/uL (ref 150–400)
RBC: 2.42 MIL/uL — ABNORMAL LOW (ref 4.22–5.81)
RBC: 2.66 MIL/uL — ABNORMAL LOW (ref 4.22–5.81)
RDW: 16.7 % — ABNORMAL HIGH (ref 11.5–15.5)
RDW: 17 % — ABNORMAL HIGH (ref 11.5–15.5)
WBC: 8.7 10*3/uL (ref 4.0–10.5)
WBC: 9.8 10*3/uL (ref 4.0–10.5)
nRBC: 0 % (ref 0.0–0.2)
nRBC: 0 % (ref 0.0–0.2)

## 2021-09-25 LAB — HEPARIN LEVEL (UNFRACTIONATED): Heparin Unfractionated: 0.74 IU/mL — ABNORMAL HIGH (ref 0.30–0.70)

## 2021-09-25 LAB — PROTIME-INR
INR: 1.1 (ref 0.8–1.2)
Prothrombin Time: 14.5 seconds (ref 11.4–15.2)

## 2021-09-25 MED ORDER — FINASTERIDE 5 MG PO TABS
5.0000 mg | ORAL_TABLET | Freq: Every day | ORAL | Status: DC
Start: 2021-09-25 — End: 2021-10-10
  Administered 2021-09-25 – 2021-10-10 (×16): 5 mg via ORAL
  Filled 2021-09-25 (×16): qty 1

## 2021-09-25 MED ORDER — IOHEXOL 300 MG/ML  SOLN
100.0000 mL | Freq: Once | INTRAMUSCULAR | Status: AC | PRN
  Administered 2021-09-25: 100 mL via INTRAVENOUS

## 2021-09-25 MED ORDER — FINASTERIDE 5 MG PO TABS: 5.0000 mg | ORAL_TABLET | Freq: Every day | ORAL | 3 refills | Status: AC

## 2021-09-25 NOTE — Progress Notes (Signed)
Physical Therapy Treatment Patient Details Name: [name] MRN: [id number] DOB: [birth year] Today's Date: 09/25/2021   History of Present Illness Pt adm 6/16 for rt femoral popliteal bypass graft. Underwent emergent surgery 6/17 for hemorrhagic shock,  reexploration of RLE, angiogram of right lower extremity and application of wound vac  PMH - RLE ulcer, copd, cad, HTN, AVR, MVR, PVD, CVA    PT Comments    Pt received supine with HOB elevated and agreeable to session with continued progress towards goals. Pt able to come to sitting EOB with supervision for safety without use of leg strap as pt able to actively abduct RLE to bring to and off bed. Pt demonstrating increased ambulation tolerance with improved mechanics progressing from step-to pattern to a step through pattern throughout session and ability to achieve foot flat with heel-toe strike on R throughout. Pt agreeable to scrotal sling donning and wear throughout session with pt reporting decreased discomfort and pain. Encouraged wear throughout day and elevation in bed. Pt continues to benefit from skilled PT services to progress toward functional mobility goals.    Recommendations for follow up therapy are one component of a multi-disciplinary discharge planning process, led by the attending physician.  Recommendations may be updated based on patient status, additional functional criteria and insurance authorization.  Follow Up Recommendations  No PT follow up     Assistance Recommended at Discharge PRN  Patient can return home with the following Assist for transportation   Equipment Recommendations  None recommended by PT    Recommendations for Other Services       Precautions / Restrictions Precautions Precautions: None Required Braces or Orthoses: Other Brace Other Brace: scrotal sling for mobility Restrictions Weight Bearing Restrictions: Yes RLE Weight Bearing: Weight bearing as tolerated     Mobility  Bed  Mobility Overal bed mobility: Needs Assistance Bed Mobility: Supine to Sit     Supine to sit: Supervision     General bed mobility comments: using bed rail and gait belt turned into leg lifter    Transfers Overall transfer level: Needs assistance Equipment used: Rolling walker (2 wheels) Transfers: Sit to/from Stand Sit to Stand: Min guard           General transfer comment: use of RW    Ambulation/Gait Ambulation/Gait assistance: Min guard, Supervision Gait Distance (Feet): 120 Feet Assistive device: Rolling walker (2 wheels) Gait Pattern/deviations: Step-through pattern, Decreased step length - left, Decreased stance time - right, Antalgic, Wide base of support Gait velocity: decr     General Gait Details: slow antagic step to gait at start progressing to step through pattern with increased speed, pt agreeable to wearing scrotal sling with pt noting increased comfort for ambulation   Stairs             Wheelchair Mobility    Modified Rankin (Stroke Patients Only)       Balance Overall balance assessment: Needs assistance Sitting-balance support: Feet supported, Bilateral upper extremity supported Sitting balance-Leahy Scale: Fair Sitting balance - Comments: required supporta at times to hold self up due to dizziness and "tightness" in leg   Standing balance support: Reliant on assistive device for balance Standing balance-Leahy Scale: Fair Standing balance comment: able to static stand at sink for ADLs with single UE support                            Cognition Arousal/Alertness: Awake/alert Behavior During Therapy: [facility] for tasks assessed/performed  Overall Cognitive Status: Within Functional Limits for tasks assessed                                          Exercises Other Exercises Other Exercises: sustained stretch of gastroc, soleus in standing Other Exercises: AROM RLE abduction x10, heel slides x10    General  Comments General comments (skin integrity, edema, etc.): pt agreeable to sling wear for scrotal swelling this session with noted improvement in gait mechanics and pt stating decrease in discomfort and pain with sling in place, encouraged contuinued wear      Pertinent Vitals/Pain Pain Assessment Pain Assessment: Faces Faces Pain Scale: Hurts a little bit Pain Location: RLE Pain Descriptors / Indicators: Sore Pain Intervention(s): Monitored during session    Home Living                          Prior Function            PT Goals (current goals can now be found in the care plan section) Acute Rehab PT Goals Patient Stated Goal: return home PT Goal Formulation: With patient Time For Goal Achievement: 09/18/21    Frequency    Min 3X/week      PT Plan      Co-evaluation              AM-PAC PT "6 Clicks" Mobility   Outcome Measure  Help needed turning from your back to your side while in a flat bed without using bedrails?: A Little Help needed moving from lying on your back to sitting on the side of a flat bed without using bedrails?: A Little Help needed moving to and from a bed to a chair (including a wheelchair)?: A Little Help needed standing up from a chair using your arms (e.g., wheelchair or bedside chair)?: A Little Help needed to walk in hospital room?: A Little Help needed climbing 3-5 steps with a railing? : A Lot 6 Click Score: 17    End of Session   Activity Tolerance: Patient tolerated treatment well Patient left: in bed;with call bell/phone within reach;with family/visitor present Nurse Communication: Mobility status PT Visit Diagnosis: Other abnormalities of gait and mobility (R26.89);Pain Pain - Right/Left: Right Pain - part of body: Leg;Knee     Time: 9562-1308 PT Time Calculation (min) (ACUTE ONLY): 41 min  Charges:  $Gait Training: 23-37 mins $Therapeutic Exercise: 8-22 mins                     Mayo Faulk R. PTA Acute  Rehabilitation Services Office: [phone]    Catalina Antigua 09/25/2021, 12:06 PM

## 2021-09-25 NOTE — Progress Notes (Addendum)
Pt has positive for pinkish/bloody color in the urine. PA, unit pharmacist, and MD aware. Will continue to monitor the pt.   Gelder Radar, RN

## 2021-09-25 NOTE — Progress Notes (Signed)
ANTICOAGULATION CONSULT NOTE  Pharmacy Consult for Heparin and Warfarin Indication: atrial fibrillation and mechanical MVR  Not on File  Patient Measurements: Height: 5\' 8"  (172.7 cm) Weight: 88.5 kg (195 lb) IBW/kg (Calculated) : 68.4  Heparin Dosing Weight: 86 kg  Vital Signs: Temp: 98.7 F (37.1 C) (06/24 0728) Temp Source: Oral (06/24 0728) BP: 119/46 (06/24 0728) Pulse Rate: 76 (06/24 0728)  Labs: Recent Labs    09/20/21 0806 09/21/21 0635 09/21/21 2054 09/22/21 0424  HGB 7.7* 7.2*  --  7.0*  HCT 22.8* 22.5*  --  22.7*  PLT 122* 139*  --  166  HEPARINUNFRC  --   --  <0.10* 0.13*    Estimated Creatinine Clearance: 97.1 mL/min (by C-G formula based on SCr of 0.82 mg/dL).   Assessment: 66 y.o. male on warfarin PTA for mechanical MVF/AVR, hx afib s/p R femoral to AK popliteal bypass with vein and subsequent hematoma evacuation 6/17. Pharmacy consulted to resume heparin slowly 6/23 per VVS and resume PTA warfarin.   PTA warfarin regimen (per 6/08 anticoagulation note): 7 mg Mon/Wed/Fri 6.5 mg all other days    Hgb remains stable but low at 7.5, plt WNL. INR came back subtherapeutic at 1.1 (warfarin restarted 6/26).   Having bleeding/hematuria - discussed with vascular and plan to hold warfarin/heparin today and monitor bleeding.  Goal of Therapy:  Heparin level 0.3-0.7 units/ml  INR 2.5-3.5 Monitor platelets by anticoagulation protocol: Yes   Plan:  Hold warfarin and heparin infusion per vascular  F/u on bleeding tomorrow to see if okay to resume anticoagulation   Thank you for allowing pharmacy to be a part of this patient's care.  Sherron Monday, PharmD, BCCCP Clinical Pharmacist  Phone: [phone] 09/25/2021 9:36 AM  Please check AMION for all [facility] Pharmacy phone numbers After 10:00 PM, call Main Pharmacy [phone]

## 2021-09-25 NOTE — Progress Notes (Signed)
Found active bleeding with bright red in  pt's penis. Stopped heparin. Notified pharmacist and PA.   Dubie Radar, RN

## 2021-09-25 NOTE — Progress Notes (Addendum)
  Progress Note    09/25/2021 7:37 AM 10 Days Post-Op  Subjective:  no complaints this morning   Vitals:   09/25/21 0006 09/25/21 0344  BP: (!) 112/48 (!) 117/48  Pulse: 74 74  Resp: (!) 21 20  Temp: 98.9 F (37.2 C) 97.8 F (36.6 C)  SpO2: 96% 96%   Physical Exam: Lungs:  non labored Incisions:  R groin and thigh incisions with dressings recently changed Extremities:  DP and PT brisk by doppler Neurologic: A&O  CBC    Component Value Date/Time   WBC 8.7 09/25/2021 0520   RBC 2.42 (L) 09/25/2021 0520   HGB 7.5 (L) 09/25/2021 0520   HCT 23.3 (L) 09/25/2021 0520   PLT 200 09/25/2021 0520   MCV 96.3 09/25/2021 0520   MCH 31.0 09/25/2021 0520   MCHC 32.2 09/25/2021 0520   RDW 16.7 (H) 09/25/2021 0520   LYMPHSABS 818 (L) 03/08/2021 1117   MONOABS 0.8 05/11/2019 1352   EOSABS 112 03/08/2021 1117   BASOSABS 33 03/08/2021 1117    BMET    Component Value Date/Time   NA 132 (L) 09/24/2021 0500   K 3.8 09/24/2021 0500   CL 96 (L) 09/24/2021 0500   CO2 29 09/24/2021 0500   GLUCOSE 119 (H) 09/24/2021 0500   BUN 17 09/24/2021 0500   CREATININE 0.80 09/24/2021 0500   CREATININE 0.90 03/08/2021 1117   CALCIUM 8.0 (L) 09/24/2021 0500   GFRNONAA >60 09/24/2021 0500   GFRNONAA 89 11/17/2019 1258   GFRAA 104 11/17/2019 1258    INR    Component Value Date/Time   INR 1.4 (H) 09/18/2021 0415     Intake/Output Summary (Last 24 hours) at 09/25/2021 0737 Last data filed at 09/25/2021 0617 Gross per 24 hour  Intake 837.46 ml  Output 850 ml  Net -12.54 ml     Assessment/Plan:  66 y.o. male is s/p R femoral to AK pop bypass with vein; subsequent take back for bleeding 10 Days Post-Op   R foot well perfused with brisk PT and DP by doppler Continue daily dressing changes to R groin and thigh H&H drifting; daily CBC; continue heparin and coumadin for now; patient is aware he may require an additional transfusion prior to d/c    Emilie Rutter, PA-C [facility] [phone] 09/25/2021 7:37 AM  I have independently interviewed and examined patient and agree with PA assessment and plan above.  We will need to watch urine for bloody discharge.  Continue heparin bridge to Coumadin for now and will possibly require additional transfusion.  He is working with physical therapy.  Maitland Muhlbauer C. Randie Heinz, MD [facility] [place] Office: [phone] Pager: [phone]

## 2021-09-26 LAB — CBC
HCT: 27 % — ABNORMAL LOW (ref 39.0–52.0)
Hemoglobin: 8.3 g/dL — ABNORMAL LOW (ref 13.0–17.0)
MCH: 30 pg (ref 26.0–34.0)
MCHC: 30.7 g/dL (ref 30.0–36.0)
MCV: 97.5 fL (ref 80.0–100.0)
Platelets: 233 10*3/uL (ref 150–400)
RBC: 2.77 MIL/uL — ABNORMAL LOW (ref 4.22–5.81)
RDW: 17 % — ABNORMAL HIGH (ref 11.5–15.5)
WBC: 8.8 10*3/uL (ref 4.0–10.5)
nRBC: 0.2 % (ref 0.0–0.2)

## 2021-09-26 LAB — PROTIME-INR
INR: 1.1 (ref 0.8–1.2)
Prothrombin Time: 14.3 seconds (ref 11.4–15.2)

## 2021-09-26 LAB — HEPARIN LEVEL (UNFRACTIONATED)
Heparin Unfractionated: <0.1 IU/mL — ABNORMAL LOW (ref 0.30–0.70)
Heparin Unfractionated: 0.18 IU/mL — ABNORMAL LOW (ref 0.30–0.70)

## 2021-09-26 MED ORDER — WARFARIN SODIUM 5 MG PO TABS
7.0000 mg | ORAL_TABLET | Freq: Once | ORAL | Status: AC
Start: 2021-09-26 — End: 2021-09-26
  Administered 2021-09-26: 7 mg via ORAL
  Filled 2021-09-26: qty 1

## 2021-09-26 MED ORDER — LACTULOSE 10 GM/15ML PO SOLN
30.0000 g | Freq: Every day | ORAL | Status: DC | PRN
  Administered 2021-09-26: 30 g via ORAL
  Filled 2021-09-26: qty 45

## 2021-09-26 MED ORDER — FUROSEMIDE 10 MG/ML IJ SOLN
40.0000 mg | Freq: Once | INTRAMUSCULAR | Status: AC
  Administered 2021-09-26: 40 mg via INTRAVENOUS
  Filled 2021-09-26: qty 4

## 2021-09-26 MED ORDER — HEPARIN (PORCINE) 25000 UT/250ML-% IV SOLN
1350.0000 [IU]/h | INTRAVENOUS | Status: DC
Start: 2021-09-26 — End: 2021-10-05
  Administered 2021-09-26: 1300 [IU]/h via INTRAVENOUS
  Administered 2021-09-28 – 2021-09-29 (×2): 1500 [IU]/h via INTRAVENOUS
  Administered 2021-09-30 – 2021-10-04 (×6): 1400 [IU]/h via INTRAVENOUS
  Filled 2021-09-26 (×13): qty 250

## 2021-09-26 NOTE — Progress Notes (Signed)
[place] for Heparin and Warfarin Indication: atrial fibrillation and mechanical MVR  Not on File  Patient Measurements: Height: '5\' 8"'$  (172.7 cm) Weight: 88.5 kg (195 lb) IBW/kg (Calculated) : 68.4  Heparin Dosing Weight: 86 kg  Vital Signs: Temp: 98.7 F (37.1 C) (06/24 0728) Temp Source: Oral (06/24 0728) BP: 119/46 (06/24 0728) Pulse Rate: 76 (06/24 0728)  Labs: Recent Labs    09/20/21 0806 09/21/21 0635 09/21/21 2054 09/22/21 0424  HGB 7.7* 7.2*  --  7.0*  HCT 22.8* 22.5*  --  22.7*  PLT 122* 139*  --  166  HEPARINUNFRC  --   --  <0.10* 0.13*    Estimated Creatinine Clearance: 97.1 mL/min (by C-G formula based on SCr of 0.82 mg/dL).   Assessment: 66 y.o. male on warfarin PTA for mechanical MVF/AVR, hx afib s/p R femoral to AK popliteal bypass with vein and subsequent hematoma evacuation 6/17. Pharmacy consulted to resume heparin slowly 6/23 per VVS and resume PTA warfarin.   PTA warfarin regimen (per 6/08 anticoagulation note): 7 mg Mon/Wed/Fri 6.5 mg all other days    Anticoagulation was held on 6/27 in afternoon due to hematuria/bleeding - discussed with vascular and okay to resume conservatively today. Hgb 8.3, plt 233. INR came back subtherapeutic at 1.1 (received one dose of warfarin 6/26).   Heparin level came back subtherapeutic this PM at 0.18. We will increase rate and check another level this PM.  Goal of Therapy:  Heparin level 0.3-0.5 units/ml  INR 2.5-3.5 Monitor platelets by anticoagulation protocol: Yes   Plan:  Increase heparin infusion to 1450 units/hr 6 hr HL  Will order warfarin 7 mg tonight  Monitor CBC, s/sx of bleeding, HL, and INR daily    Onnie Boer, PharmD, [place], AAHIVP, CPP Infectious Disease Pharmacist 09/26/2021 3:36 PM

## 2021-09-26 NOTE — Progress Notes (Addendum)
  Progress Note    09/26/2021 7:52 AM 11 Days Post-Op  Subjective:  No complaints   Vitals:   09/25/21 2341 09/26/21 0306  BP: (!) 124/45 (!) 127/56  Pulse: 75 73  Resp: 19 20  Temp: 98.4 F (36.9 C) 98.3 F (36.8 C)  SpO2: 95% 95%   Physical Exam: Lungs:  non labored Incisions:  R groin and thigh with serosanguinous drainage Extremities:  brisk PT and DP by doppler Neurologic: A&O  CBC    Component Value Date/Time   WBC 8.8 09/26/2021 0709   RBC 2.77 (L) 09/26/2021 0709   HGB 8.3 (L) 09/26/2021 0709   HCT 27.0 (L) 09/26/2021 0709   PLT 233 09/26/2021 0709   MCV 97.5 09/26/2021 0709   MCH 30.0 09/26/2021 0709   MCHC 30.7 09/26/2021 0709   RDW 17.0 (H) 09/26/2021 0709   LYMPHSABS 818 (L) 03/08/2021 1117   MONOABS 0.8 05/11/2019 1352   EOSABS 112 03/08/2021 1117   BASOSABS 33 03/08/2021 1117    BMET    Component Value Date/Time   NA 132 (L) 09/24/2021 0500   K 3.8 09/24/2021 0500   CL 96 (L) 09/24/2021 0500   CO2 29 09/24/2021 0500   GLUCOSE 119 (H) 09/24/2021 0500   BUN 17 09/24/2021 0500   CREATININE 0.80 09/24/2021 0500   CREATININE 0.90 03/08/2021 1117   CALCIUM 8.0 (L) 09/24/2021 0500   GFRNONAA >60 09/24/2021 0500   GFRNONAA 89 11/17/2019 1258   GFRAA 104 11/17/2019 1258    INR    Component Value Date/Time   INR 1.1 09/26/2021 0709     Intake/Output Summary (Last 24 hours) at 09/26/2021 9628 Last data filed at 09/26/2021 3662 Gross per 24 hour  Intake 312.82 ml  Output 1400 ml  Net -1087.18 ml     Assessment/Plan:  66 y.o. male is s/p R femoral to AK pop bypass with vein; subsequent take back for bleeding  11 Days Post-Op   BLE well perfused R thigh incision with drainage; continue dressing changes at least daily; will give another dose of lasix  Hematuria: appreciate Urology recs; we will restart heparin and coumadin and monitor H&H OOB with therapy    Dagoberto Ligas, PA-C [facility] [phone] 09/26/2021 7:52 AM  I have independently interviewed and examined patient and agree with PA assessment and plan above.  Appreciate urology recommendations and urine is clear today.  Heparin and Coumadin restarted.  We will continue to mobilize make sure he has no bleeding issues as his INR nears goal and then can transition to Lovenox and possible discharge home with home health nursing.  Marisella Puccio C. Donzetta Matters, MD [facility] [place] Office: [phone] Pager: [phone]

## 2021-09-26 NOTE — Progress Notes (Addendum)
[facility] for Heparin and Warfarin Indication: atrial fibrillation and mechanical MVR  Not on File  Patient Measurements: Height: '5\' 8"'$  (172.7 cm) Weight: 88.5 kg (195 lb) IBW/kg (Calculated) : 68.4  Heparin Dosing Weight: 86 kg  Vital Signs: Temp: 98.7 F (37.1 C) (06/24 0728) Temp Source: Oral (06/24 0728) BP: 119/46 (06/24 0728) Pulse Rate: 76 (06/24 0728)  Labs: Recent Labs    09/20/21 0806 09/21/21 0635 09/21/21 2054 09/22/21 0424  HGB 7.7* 7.2*  --  7.0*  HCT 22.8* 22.5*  --  22.7*  PLT 122* 139*  --  166  HEPARINUNFRC  --   --  <0.10* 0.13*    Estimated Creatinine Clearance: 97.1 mL/min (by C-G formula based on SCr of 0.82 mg/dL).   Assessment: 66 y.o. male on warfarin PTA for mechanical MVF/AVR, hx afib s/p R femoral to AK popliteal bypass with vein and subsequent hematoma evacuation 6/17. Pharmacy consulted to resume heparin slowly 6/23 per VVS and resume PTA warfarin.   PTA warfarin regimen (per 6/08 anticoagulation note): 7 mg Mon/Wed/Fri 6.5 mg all other days    Anticoagulation was held on 6/27 in afternoon due to hematuria/bleeding - discussed with vascular and okay to resume conservatively today. Hgb 8.3, plt 233. INR came back subtherapeutic at 1.1 (received one dose of warfarin 6/26).   Goal of Therapy:  Heparin level 0.3-0.5 units/ml  INR 2.5-3.5 Monitor platelets by anticoagulation protocol: Yes   Plan:  Restart heparin infusion at 1300 units/hr >> will get 6 hr heparin level Will order warfarin 7 mg tonight  Monitor CBC, s/sx of bleeding, HL, and INR daily    Thank you for allowing pharmacy to be a part of this patient's care.  Antonietta Jewel, PharmD, [place] Clinical Pharmacist  Phone: [phone] 09/26/2021 7:32 AM  Please check AMION for all [place] phone numbers After 10:00 PM, call [place] [phone]

## 2021-09-26 NOTE — Progress Notes (Signed)
Occupational Therapy Treatment Patient Details Name: [name] MRN: [id number] DOB: [birth year] Today's Date: 09/26/2021   History of present illness Pt adm 6/16 for rt femoral popliteal bypass graft. Underwent emergent surgery 6/17 for hemorrhagic shock,  reexploration of RLE, angiogram of right lower extremity and application of wound vac  PMH - RLE ulcer, copd, cad, HTN, AVR, MVR, PVD, CVA   OT comments  Patient received in bed and asked to not go far from bed due to on lasix. Patient able to get to EOB with supervision and increased time using gait belt to assist with RLE. Patient stood from EOB and performed short mobility to recliner and performed static standing before sitting. Patient was setup for grooming seated in recliner. Acute OT to continue to follow.    Recommendations for follow up therapy are one component of a multi-disciplinary discharge planning process, led by the attending physician.  Recommendations may be updated based on patient status, additional functional criteria and insurance authorization.    Follow Up Recommendations  Home health OT    Assistance Recommended at Discharge Intermittent Supervision/Assistance  Patient can return home with the following  A little help with bathing/dressing/bathroom;Assistance with cooking/housework;Assist for transportation   Equipment Recommendations  BSC/3in1    Recommendations for Other Services      Precautions / Restrictions Precautions Precautions: None Required Braces or Orthoses: Other Brace Other Brace: scrotal sling for mobility Restrictions Weight Bearing Restrictions: Yes RLE Weight Bearing: Weight bearing as tolerated       Mobility Bed Mobility Overal bed mobility: Needs Assistance Bed Mobility: Supine to Sit     Supine to sit: Supervision     General bed mobility comments: increased time with gait belt to use as leg lifter    Transfers Overall transfer level: Needs assistance Equipment  used: Rolling walker (2 wheels) Transfers: Sit to/from Stand Sit to Stand: Min guard           General transfer comment: RW and increased time     Balance Overall balance assessment: Needs assistance Sitting-balance support: Feet supported, Bilateral upper extremity supported Sitting balance-Leahy Scale: Fair Sitting balance - Comments: no assistance for sitting balance   Standing balance support: Reliant on assistive device for balance Standing balance-Leahy Scale: Fair Standing balance comment: reliant on UE support                           ADL either performed or assessed with clinical judgement   ADL Overall ADL's : Needs assistance/impaired     Grooming: Wash/dry hands;Wash/dry face;Brushing hair;Set up;Sitting Grooming Details (indicate cue type and reason): performed seated in recliner                 Toilet Transfer: Min guard;Rolling walker (2 wheels) Toilet Transfer Details (indicate cue type and reason): simulated to recliner           General ADL Comments: patient asked to address grooming seated    Extremity/Trunk Assessment              Vision       Perception     Praxis      Cognition Arousal/Alertness: Awake/alert Behavior During Therapy: WFL for tasks assessed/performed Overall Cognitive Status: Within Functional Limits for tasks assessed                                 General Comments: motivated  towards therapy        Exercises      Shoulder Instructions       General Comments      Pertinent Vitals/ Pain       Pain Assessment Pain Assessment: Faces Faces Pain Scale: Hurts a little bit Pain Location: RLE Pain Descriptors / Indicators: Sore Pain Intervention(s): Monitored during session, Repositioned  Home Living                                          Prior Functioning/Environment              Frequency  Min 2X/week        Progress Toward Goals  OT  Goals(current goals can now be found in the care plan section)  Progress towards OT goals: Progressing toward goals  Acute Rehab OT Goals Patient Stated Goal: get better OT Goal Formulation: With patient Time For Goal Achievement: 10/01/21 Potential to Achieve Goals: Good ADL Goals Pt Will Perform Lower Body Bathing: with modified independence;sit to/from stand;with adaptive equipment Pt Will Perform Lower Body Dressing: with modified independence;sit to/from stand;with adaptive equipment Pt Will Transfer to Toilet: with modified independence;ambulating Pt Will Perform Toileting - Clothing Manipulation and hygiene: with modified independence;sit to/from stand Additional ADL Goal #1: Pt will independently manage scrotal sling to reduce pain and edema and increase independence with mobility.  Plan Discharge plan needs to be updated    Co-evaluation                 AM-PAC OT "6 Clicks" Daily Activity     Outcome Measure   Help from another person eating meals?: None Help from another person taking care of personal grooming?: None Help from another person toileting, which includes using toliet, bedpan, or urinal?: A Lot Help from another person bathing (including washing, rinsing, drying)?: A Lot Help from another person to put on and taking off regular upper body clothing?: A Little Help from another person to put on and taking off regular lower body clothing?: A Lot 6 Click Score: 17    End of Session Equipment Utilized During Treatment: Rolling walker (2 wheels)  OT Visit Diagnosis: Unsteadiness on feet (R26.81);Other abnormalities of gait and mobility (R26.89);Muscle weakness (generalized) (M62.81);Pain Pain - Right/Left: Right Pain - part of body: Leg   Activity Tolerance Patient tolerated treatment well   Patient Left in chair;with call bell/phone within reach   Nurse Communication Mobility status        Time: 5638-7564 OT Time Calculation (min): 30  min  Charges: OT General Charges $OT Visit: 1 Visit OT Treatments $Self Care/Home Management : 8-22 mins $Therapeutic Activity: 8-22 mins  Lodema Hong, [place]  Office [phone]   Trixie Dredge 09/26/2021, 10:52 AM

## 2021-09-26 NOTE — Progress Notes (Signed)
Mobility Specialist: Progress Note   09/26/21 1140  Mobility  Activity Transferred from chair to bed  Level of Assistance Minimal assist, patient does 75% or more  Assistive Device Front wheel walker  RLE Weight Bearing WBAT  Distance Ambulated (ft) 2 ft  Activity Response Tolerated well  $Mobility charge 1 Mobility   Pt assisted back to bed per request. MinA to stand. Pt back in bed with call bell and phone at his side. RN present in the room.   [facility] Theresa Dohrman Mobility Specialist Mobility Specialist [address]: [phone]

## 2021-09-27 ENCOUNTER — Other Ambulatory Visit (HOSPITAL_COMMUNITY): Payer: Self-pay

## 2021-09-27 LAB — CBC
HCT: 25.9 % — ABNORMAL LOW (ref 39.0–52.0)
Hemoglobin: 7.9 g/dL — ABNORMAL LOW (ref 13.0–17.0)
MCH: 29.9 pg (ref 26.0–34.0)
MCHC: 30.5 g/dL (ref 30.0–36.0)
MCV: 98.1 fL (ref 80.0–100.0)
Platelets: 233 10*3/uL (ref 150–400)
RBC: 2.64 MIL/uL — ABNORMAL LOW (ref 4.22–5.81)
RDW: 16.9 % — ABNORMAL HIGH (ref 11.5–15.5)
WBC: 9.1 10*3/uL (ref 4.0–10.5)
nRBC: 0 % (ref 0.0–0.2)

## 2021-09-27 LAB — HEPARIN LEVEL (UNFRACTIONATED)
Heparin Unfractionated: 0.26 IU/mL — ABNORMAL LOW (ref 0.30–0.70)
Heparin Unfractionated: 0.73 IU/mL — ABNORMAL HIGH (ref 0.30–0.70)
Heparin Unfractionated: 0.62 IU/mL (ref 0.30–0.70)

## 2021-09-27 LAB — BASIC METABOLIC PANEL
Anion gap: 6 (ref 5–15)
BUN: 16 mg/dL (ref 8–23)
CO2: 28 mmol/L (ref 22–32)
Calcium: 8.1 mg/dL — ABNORMAL LOW (ref 8.9–10.3)
Chloride: 103 mmol/L (ref 98–111)
Creatinine, Ser: 0.97 mg/dL (ref 0.61–1.24)
GFR, Estimated: >60 mL/min (ref >60)
Glucose, Bld: 114 mg/dL — ABNORMAL HIGH (ref 70–99)
Potassium: 4 mmol/L (ref 3.5–5.1)
Sodium: 137 mmol/L (ref 135–145)

## 2021-09-27 LAB — PROTIME-INR
INR: 1.3 — ABNORMAL HIGH (ref 0.8–1.2)
Prothrombin Time: 15.9 seconds — ABNORMAL HIGH (ref 11.4–15.2)

## 2021-09-27 MED ORDER — WARFARIN SODIUM 4 MG PO TABS
6.5000 mg | ORAL_TABLET | Freq: Once | ORAL | Status: AC
  Administered 2021-09-27: 6.5 mg via ORAL
  Filled 2021-09-27: qty 1

## 2021-09-27 NOTE — TOC Benefit Eligibility Note (Addendum)
Patient Teacher, English as a foreign language completed.  The patient is insured through [employer]  The patient is currently admitted and upon discharge could be taking Enoxaparin '100MG'$ /mL.  The current 30 day co-pay is $117.95.    Lyndel Safe, [place] Patient Advocate Specialist [place] Patient Advocate Team Direct Number: [phone]  Fax: [phone]

## 2021-09-27 NOTE — Progress Notes (Addendum)
  Progress Note    09/27/2021 7:46 AM 12 Days Post-Op  Subjective:  no complaints   Vitals:   09/27/21 0523 09/27/21 0741  BP: (!) 111/50 112/64  Pulse: 93 71  Resp: 19 15  Temp: 98.1 F (36.7 C) 97.8 F (36.6 C)  SpO2: 96% 95%   Physical Exam: Lungs:  non labored Extremities:  brisk R PT and DP by doppler Neurologic: A&O  CBC    Component Value Date/Time   WBC 9.1 09/27/2021 0228   RBC 2.64 (L) 09/27/2021 0228   HGB 7.9 (L) 09/27/2021 0228   HCT 25.9 (L) 09/27/2021 0228   PLT 233 09/27/2021 0228   MCV 98.1 09/27/2021 0228   MCH 29.9 09/27/2021 0228   MCHC 30.5 09/27/2021 0228   RDW 16.9 (H) 09/27/2021 0228   LYMPHSABS 818 (L) 03/08/2021 1117   MONOABS 0.8 05/11/2019 1352   EOSABS 112 03/08/2021 1117   BASOSABS 33 03/08/2021 1117    BMET    Component Value Date/Time   NA 137 09/27/2021 0228   K 4.0 09/27/2021 0228   CL 103 09/27/2021 0228   CO2 28 09/27/2021 0228   GLUCOSE 114 (H) 09/27/2021 0228   BUN 16 09/27/2021 0228   CREATININE 0.97 09/27/2021 0228   CREATININE 0.90 03/08/2021 1117   CALCIUM 8.1 (L) 09/27/2021 0228   GFRNONAA >60 09/27/2021 0228   GFRNONAA 89 11/17/2019 1258   GFRAA 104 11/17/2019 1258    INR    Component Value Date/Time   INR 1.1 09/26/2021 0709     Intake/Output Summary (Last 24 hours) at 09/27/2021 0746 Last data filed at 09/26/2021 1720 Gross per 24 hour  Intake 91.91 ml  Output 550 ml  Net -458.09 ml     Assessment/Plan:  66 y.o. male is s/p R femoral to AK pop bypass with vein; subsequent take back for bleeding   12 Days Post-Op   R foot well perfused based on doppler exam H&H relatively stable; continue heparin and coumadin; may be able to discharge on lovenox if INR near therapeutic    Dagoberto Ligas, PA-C [facility] [phone] 09/27/2021 7:46 AM  I have independently evaluated patient and agree with PA assessment and plan above.  He is resting comfortably this morning.  I would  like to see his INR get towards therapeutic prior to discharging given his multiple bleeding complications during his hospitalization.  Ultimately he will be able to be discharged home with home health.  Ashle Stief C. Donzetta Matters, MD [facility] [place] Office: [phone] Pager: [phone]

## 2021-09-27 NOTE — Progress Notes (Signed)
Physical Therapy Treatment Patient Details Name: [name] MRN: [id number] DOB: 09-15-1955 Today's Date: 09/27/2021   History of Present Illness Pt adm 6/16 for rt femoral popliteal bypass graft. Underwent emergent surgery 6/17 for hemorrhagic shock,  reexploration of RLE, angiogram of right lower extremity and application of wound vac  PMH - RLE ulcer, copd, cad, HTN, AVR, MVR, PVD, CVA    PT Comments    Pt received supine and agreeable to session with great progress towards goals. Pt able to demonstrate all bed mobility at supervision level with less time needed to complete. Pt able to come to standing with min guard for safety with RW and demonstrate increased ambulation distance with improved gait mechanics with good heel strike and improved foot flat in stance. Pt able to ascend/descend steps in stairwell with no LOB with cues for sequencing and heavy use of rail. Pt continues to benefit from skilled PT services to progress toward functional mobility goals.    Recommendations for follow up therapy are one component of a multi-disciplinary discharge planning process, led by the attending physician.  Recommendations may be updated based on patient status, additional functional criteria and insurance authorization.  Follow Up Recommendations  No PT follow up     Assistance Recommended at Discharge PRN  Patient can return home with the following Assist for transportation   Equipment Recommendations  None recommended by PT    Recommendations for Other Services       Precautions / Restrictions Precautions Precautions: None Required Braces or Orthoses: Other Brace Other Brace: scrotal sling for mobility Restrictions Weight Bearing Restrictions: Yes RLE Weight Bearing: Weight bearing as tolerated     Mobility  Bed Mobility Overal bed mobility: Needs Assistance Bed Mobility: Supine to Sit, Sit to Supine     Supine to sit: Supervision Sit to supine: Supervision   General  bed mobility comments: using bed rail, able to mobilize RLE without strap    Transfers Overall transfer level: Needs assistance Equipment used: Rolling walker (2 wheels) Transfers: Sit to/from Stand Sit to Stand: Min guard           General transfer comment: use of RW    Ambulation/Gait Ambulation/Gait assistance: Min guard, Supervision Gait Distance (Feet): 280 Feet Assistive device: Rolling walker (2 wheels) Gait Pattern/deviations: Step-through pattern, Decreased step length - left, Decreased stance time - right, Antalgic, Wide base of support Gait velocity: decr     General Gait Details: slow step through gait with imprved foot flat and heel strike, no LOB   Stairs Stairs: Yes Stairs assistance: Min assist, Min guard Stair Management: One rail Right, Step to pattern, Forwards Number of Stairs: 3 General stair comments: up/down 3 steps in stairwell with min assist for sequencing with no LOB   Wheelchair Mobility    Modified Rankin (Stroke Patients Only)       Balance Overall balance assessment: Needs assistance Sitting-balance support: Feet supported, Bilateral upper extremity supported Sitting balance-Leahy Scale: Fair Sitting balance - Comments: required supporta at times to hold self up due to dizziness and "tightness" in leg   Standing balance support: Reliant on assistive device for balance Standing balance-Leahy Scale: Fair Standing balance comment: able to static stand at sink for ADLs with single UE support                            Cognition Arousal/Alertness: Awake/alert Behavior During Therapy: WFL for tasks assessed/performed Overall Cognitive Status: Within Functional  Limits for tasks assessed                                 General Comments: motivated to return home and for session        Exercises      General Comments General comments (skin integrity, edema, etc.): VSS on RA      Pertinent Vitals/Pain  Pain Assessment Pain Assessment: Faces Faces Pain Scale: Hurts a little bit Pain Location: RLE Pain Descriptors / Indicators: Sore Pain Intervention(s): Monitored during session, Limited activity within patient's tolerance    Home Living                          Prior Function            PT Goals (current goals can now be found in the care plan section) Acute Rehab PT Goals Patient Stated Goal: return home PT Goal Formulation: With patient Time For Goal Achievement: 09/18/21    Frequency    Min 3X/week      PT Plan      Co-evaluation              AM-PAC PT "6 Clicks" Mobility   Outcome Measure  Help needed turning from your back to your side while in a flat bed without using bedrails?: A Little Help needed moving from lying on your back to sitting on the side of a flat bed without using bedrails?: A Little Help needed moving to and from a bed to a chair (including a wheelchair)?: A Little Help needed standing up from a chair using your arms (e.g., wheelchair or bedside chair)?: A Little Help needed to walk in hospital room?: A Little Help needed climbing 3-5 steps with a railing? : A Little 6 Click Score: 18    End of Session   Activity Tolerance: Patient tolerated treatment well Patient left: in bed;with call bell/phone within reach;with family/visitor present Nurse Communication: Mobility status PT Visit Diagnosis: Other abnormalities of gait and mobility (R26.89);Pain Pain - Right/Left: Right Pain - part of body: Leg;Knee     Time: 1000-1027 PT Time Calculation (min) (ACUTE ONLY): 27 min  Charges:  $Gait Training: 23-37 mins                     Jamariya Davidoff R. PTA Acute Rehabilitation Services Office: [place] 09/27/2021, 10:53 AM

## 2021-09-27 NOTE — Progress Notes (Addendum)
[place] for Heparin and Warfarin Indication: atrial fibrillation and mechanical MVR  Not on File  Patient Measurements: Height: '5\' 8"'$  (172.7 cm) Weight: 88.5 kg (195 lb) IBW/kg (Calculated) : 68.4  Heparin Dosing Weight: 86 kg  Vital Signs: Temp: 98.7 F (37.1 C) (06/24 0728) Temp Source: Oral (06/24 0728) BP: 119/46 (06/24 0728) Pulse Rate: Gregory (06/24 0728)  Labs: Recent Labs    09/20/21 0806 09/21/21 0635 09/21/21 2054 09/22/21 0424  HGB 7.7* 7.2*  --  7.0*  HCT 22.8* 22.5*  --  22.7*  PLT 122* 139*  --  166  HEPARINUNFRC  --   --  <0.10* 0.13*    Estimated Creatinine Clearance: 97.1 mL/min (by C-G formula based on SCr of 0.82 mg/dL).   Assessment: 66 y.o. [name] on warfarin PTA for mechanical MVF/AVR, hx afib s/p R femoral to AK popliteal bypass with vein and subsequent hematoma evacuation 6/17. Pharmacy consulted to resume heparin slowly 6/23 per VVS and resume PTA warfarin.   PTA warfarin regimen (per 6/08 anticoagulation note): 7 mg Mon/Wed/Fri 6.5 mg all other days    Heparin level came back higher than goal range at 0.73, on 1550 units/hr. Hgb down slightly to 7.9, plt 233. INR today is 1.3. Given no further bleeding, will increase goal range back to standard 0.3-0.7.   Goal of Therapy:  Heparin level 0.3-0.7 units/ml  INR 2.5-3.5 Monitor platelets by anticoagulation protocol: Yes   Plan:  Reduce heparin infusion to 1500 units/hr - will check level to make sure trends down appropriately and no bleeding Will order warfarin 6.5 mg tonight  Monitor CBC, s/sx of bleeding, HL, and INR daily    Antonietta Jewel, PharmD, [place] Pharmacist  Phone: [phone] 09/27/2021 8:39 AM  Please check AMION for all [place] phone numbers After 10:00 PM, call [place] [phone]  ADDENDUM Heparin level came back therapeutic at 0.62, on 1500 units/hr. No s/sx of bleeding or infusion issues. Will continue at same rate  and get level with AM labs.   Antonietta Jewel, PharmD, [place] Clinical Pharmacist  Phone: [phone] 09/27/2021 2:40 PM

## 2021-09-27 NOTE — Progress Notes (Signed)
Mobility Specialist: Progress Note   09/27/21 1522  Mobility  Activity Ambulated with assistance in hallway  Level of Assistance Standby assist, set-up cues, supervision of patient - no hands on  Assistive Device Front wheel walker  RLE Weight Bearing WBAT  Distance Ambulated (ft) 260 ft  Activity Response Tolerated well  $Mobility charge 1 Mobility   Pre-Mobility: 76 HR  Pt received in the bed and agreeable to mobility. C/o 2/10 RLE pain during session, otherwise no c/o. To BR after session per request. Instructed pt to pull call string when finished to be assist back to bed.   Ms [facility] Christiana Mobility Specialist Mobility Specialist [address]: [phone]

## 2021-09-27 NOTE — Progress Notes (Signed)
[place] for Heparin  Indication: atrial fibrillation and mechanical MVR Brief A/P: Heparin level subtherapeutic Increase Heparin rate  Not on File  Patient Measurements: Height: '5\' 8"'$  (172.7 cm) Weight: 88.5 kg (195 lb) IBW/kg (Calculated) : 68.4  Heparin Dosing Weight: 86 kg  Vital Signs: Temp: 98.7 F (37.1 C) (06/24 0728) Temp Source: Oral (06/24 0728) BP: 119/46 (06/24 0728) Pulse Rate: 76 (06/24 0728)  Labs: Recent Labs    09/20/21 0806 09/21/21 0635 09/21/21 2054 09/22/21 0424  HGB 7.7* 7.2*  --  7.0*  HCT 22.8* 22.5*  --  22.7*  PLT 122* 139*  --  166  HEPARINUNFRC  --   --  <0.10* 0.13*    Estimated Creatinine Clearance: 97.1 mL/min (by C-G formula based on SCr of 0.82 mg/dL).   Assessment: 66 y.o. male with mechanical MVR/AVR and hx afib, Coumadin on hold s/p R femoral to AK popliteal bypass with vein and subsequent hematoma evacuation 6/17, for heparin.   Goal of Therapy:  Heparin level 0.3-0.5 units/ml  Monitor platelets by anticoagulation protocol: Yes   Plan:  Increase Heparin 1550 units/hr Follow-up am labs.  Phillis Knack, PharmD, BCPS

## 2021-09-28 ENCOUNTER — Other Ambulatory Visit (HOSPITAL_COMMUNITY): Payer: Self-pay

## 2021-09-28 LAB — HEPARIN LEVEL (UNFRACTIONATED): Heparin Unfractionated: 0.57 IU/mL (ref 0.30–0.70)

## 2021-09-28 LAB — CBC
HCT: 26.8 % — ABNORMAL LOW (ref 39.0–52.0)
Hemoglobin: 8 g/dL — ABNORMAL LOW (ref 13.0–17.0)
MCH: 29.6 pg (ref 26.0–34.0)
MCHC: 29.9 g/dL — ABNORMAL LOW (ref 30.0–36.0)
MCV: 99.3 fL (ref 80.0–100.0)
Platelets: 254 10*3/uL (ref 150–400)
RBC: 2.7 MIL/uL — ABNORMAL LOW (ref 4.22–5.81)
RDW: 17.1 % — ABNORMAL HIGH (ref 11.5–15.5)
WBC: 8.7 10*3/uL (ref 4.0–10.5)
nRBC: 0 % (ref 0.0–0.2)

## 2021-09-28 LAB — PROTIME-INR
INR: 1.2 (ref 0.8–1.2)
Prothrombin Time: 15.5 seconds — ABNORMAL HIGH (ref 11.4–15.2)

## 2021-09-28 MED ORDER — WARFARIN SODIUM 5 MG PO TABS: 7.5000 mg | ORAL_TABLET | Freq: Once | ORAL | Status: DC

## 2021-09-28 MED ORDER — WARFARIN SODIUM 7.5 MG PO TABS
7.5000 mg | ORAL_TABLET | Freq: Once | ORAL | Status: AC
  Administered 2021-09-28: 7.5 mg via ORAL
  Filled 2021-09-28: qty 1

## 2021-09-28 NOTE — Care Management Important Message (Signed)
Important Message  Patient Details  Name: [name] MRN: [id number] Date of Birth: 1955-12-13   Medicare Important Message Given:  Yes     Shelda Altes 09/28/2021, 8:37 AM

## 2021-09-28 NOTE — Progress Notes (Addendum)
[place] for Heparin and Warfarin Indication: atrial fibrillation and mechanical MVR  Not on File  Patient Measurements: Height: '5\' 8"'$  (172.7 cm) Weight: 88.5 kg (195 lb) IBW/kg (Calculated) : 68.4  Heparin Dosing Weight: 86 kg  Vital Signs: Temp: 98.7 F (37.1 C) (06/24 0728) Temp Source: Oral (06/24 0728) BP: 119/46 (06/24 0728) Pulse Rate: 76 (06/24 0728)  Labs: Recent Labs    09/20/21 0806 09/21/21 0635 09/21/21 2054 09/22/21 0424  HGB 7.7* 7.2*  --  7.0*  HCT 22.8* 22.5*  --  22.7*  PLT 122* 139*  --  166  HEPARINUNFRC  --   --  <0.10* 0.13*    Estimated Creatinine Clearance: 97.1 mL/min (by C-G formula based on SCr of 0.82 mg/dL).   Assessment: 66 y.o. male on warfarin PTA for mechanical MVF/AVR, hx afib s/p R femoral to AK popliteal bypass with vein and subsequent hematoma evacuation 6/17. Pharmacy consulted to resume heparin slowly 6/23 per VVS and resume PTA warfarin.   PTA warfarin regimen (per 6/08 anticoagulation note): 7 mg Mon/Wed/Fri 6.5 mg all other days    Heparin level came back therapeutic at 0.57, on 1500 units/hr. INR is 1.2. Hgb 8, plt 254. No s/sx of bleeding or infusion issues.    Talked with patient and confirms has some supply of enoxaparin for bridge at home and is familiar with using it.  Goal of Therapy:  Heparin level 0.3-0.7 units/ml  INR 2.5-3.5 Monitor platelets by anticoagulation protocol: Yes   Plan:  Continue heparin infusion at 1500 units/hr Will order warfarin 7.5 mg tonight to get INR trending upwards since dose held Wednesday Monitor CBC, s/sx of bleeding, HL, and INR daily    Antonietta Jewel, PharmD, BCCCP Clinical Pharmacist  Phone: [phone] 09/28/2021 7:32 AM  Please check AMION for all [place] phone numbers After 10:00 PM, call [place] [phone]

## 2021-09-28 NOTE — Progress Notes (Signed)
Mobility Specialist Progress Note    09/28/21 1220  Mobility  Activity Ambulated with assistance in hallway  Level of Assistance Standby assist, set-up cues, supervision of patient - no hands on  Assistive Device Front wheel walker  RLE Weight Bearing WBAT  Distance Ambulated (ft) 260 ft  Activity Response Tolerated well  $Mobility charge 1 Mobility   Pre-Mobility: 72 HR, 113/44 BP, 98% SpO2 During Mobility: 80 HR Post-Mobility: 78 HR  Pt received in bed and agreeable. No complaints on walk. Returned to bed with call bell in reach. Pt had a few drops of blood from a scab coming off of his inner thigh and his leg dressing fell during activity, RN aware.   Hildred Alamin Mobility Specialist

## 2021-09-28 NOTE — Progress Notes (Addendum)
  Progress Note    09/28/2021 7:59 AM 13 Days Post-Op  Subjective:  no complaints. Able to lift his leg this morning without strap and was very pleased   Vitals:   09/28/21 0424 09/28/21 0730  BP: (!) 110/54 121/62  Pulse: 70 71  Resp: 20 20  Temp: 98.2 F (36.8 C) 98.2 F (36.8 C)  SpO2: 97% 98%   Physical Exam: Cardiac:  regular Lungs:  non labored Incisions:  right groin, right thigh incisions with some serosanguinous drainage. Dry dressings applied Extremities: brisk right Dp/PT signals  Abdomen:  soft, non distended Neurologic: alert and oriented  CBC    Component Value Date/Time   WBC 8.7 09/28/2021 0115   RBC 2.70 (L) 09/28/2021 0115   HGB 8.0 (L) 09/28/2021 0115   HCT 26.8 (L) 09/28/2021 0115   PLT 254 09/28/2021 0115   MCV 99.3 09/28/2021 0115   MCH 29.6 09/28/2021 0115   MCHC 29.9 (L) 09/28/2021 0115   RDW 17.1 (H) 09/28/2021 0115   LYMPHSABS 818 (L) 03/08/2021 1117   MONOABS 0.8 05/11/2019 1352   EOSABS 112 03/08/2021 1117   BASOSABS 33 03/08/2021 1117    BMET    Component Value Date/Time   NA 137 09/27/2021 0228   K 4.0 09/27/2021 0228   CL 103 09/27/2021 0228   CO2 28 09/27/2021 0228   GLUCOSE 114 (H) 09/27/2021 0228   BUN 16 09/27/2021 0228   CREATININE 0.97 09/27/2021 0228   CREATININE 0.90 03/08/2021 1117   CALCIUM 8.1 (L) 09/27/2021 0228   GFRNONAA >60 09/27/2021 0228   GFRNONAA 89 11/17/2019 1258   GFRAA 104 11/17/2019 1258    INR    Component Value Date/Time   INR 1.2 09/28/2021 0115     Intake/Output Summary (Last 24 hours) at 09/28/2021 0759 Last data filed at 09/28/2021 0543 Gross per 24 hour  Intake 1106.97 ml  Output 1550 ml  Net -443.03 ml     Assessment/Plan:  66 y.o. male is s/p R femoral to AK pop bypass with vein; subsequent take back for bleeding    13 Days Post-Op   Right lower extremity well perfused and warm with brisk doppler PT/ AT signals H&H stable On heparin and Coumadin Pending INR to therapeutic  level before d/c Pain overall well controlled Continue to mobilize as tolerated  DVT prophylaxis: Coumadin    Karoline Caldwell, PA-C [facility] [phone] 09/28/2021 7:59 AM  I have independently interviewed and examined patient and agree with PA assessment and plan above.  Everything seems to be improving in his right lower extremity and scrotal edema without further hematuria.  Plan to get INR closer to therapeutic level prior to discharge hopefully in the next couple days.  Collen Hostler C. Donzetta Matters, MD [facility] [place] Office: [phone] Pager: [phone]

## 2021-09-29 LAB — PROTIME-INR
INR: 1.3 — ABNORMAL HIGH (ref 0.8–1.2)
Prothrombin Time: 16.4 seconds — ABNORMAL HIGH (ref 11.4–15.2)

## 2021-09-29 LAB — HEPARIN LEVEL (UNFRACTIONATED): Heparin Unfractionated: 0.54 IU/mL (ref 0.30–0.70)

## 2021-09-29 MED ORDER — WARFARIN SODIUM 7.5 MG PO TABS
7.5000 mg | ORAL_TABLET | Freq: Once | ORAL | Status: AC
  Administered 2021-09-29: 7.5 mg via ORAL
  Filled 2021-09-29: qty 1

## 2021-09-29 NOTE — Progress Notes (Signed)
ANTICOAGULATION CONSULT NOTE  Pharmacy Consult for Heparin and Warfarin Indication: atrial fibrillation and mechanical MVR, PAD  Not on File  Patient Measurements: Height: '5\' 8"'$  (172.7 cm) Weight: 88.5 kg (195 lb) IBW/kg (Calculated) : 68.4  Heparin Dosing Weight: 86 kg  Vital Signs: Temp: 98.7 F (37.1 C) (06/24 0728) Temp Source: Oral (06/24 0728) BP: 119/46 (06/24 0728) Pulse Rate: 76 (06/24 0728)  Labs: Recent Labs    09/20/21 0806 09/21/21 0635 09/21/21 2054 09/22/21 0424  HGB 7.7* 7.2*  --  7.0*  HCT 22.8* 22.5*  --  22.7*  PLT 122* 139*  --  166  HEPARINUNFRC  --   --  <0.10* 0.13*    Estimated Creatinine Clearance: 97.1 mL/min (by C-G formula based on SCr of 0.82 mg/dL).   Assessment: 66 y.o. male on warfarin PTA for mechanical MVR/AVR, hx afib s/p R femoral to AK popliteal bypass with vein and subsequent hematoma evacuation 6/17. Pharmacy consulted to resume heparin slowly 6/23 per VVS and resume PTA warfarin.   PTA warfarin regimen (per 6/08 anticoagulation note): 7 mg Mon/Wed/Fri 6.5 mg all other days    Heparin level therapeutic at 0.54, on heparin drip rate 1500 units/hr. INR is 1.3 after slight boost last pm of warfarin 7.'5mg'$  x1. Hgb 8, plt 254. No s/sx of bleeding or infusion issues.    Talked with patient and confirms has some supply of enoxaparin for bridge at home and is familiar with using it.  Goal of Therapy:  Heparin level 0.3-0.7 units/ml  INR 2.5-3.5 Monitor platelets by anticoagulation protocol: Yes   Plan:  Continue heparin infusion at 1500 units/hr Will repeat warfarin 7.5 mg tonight to get INR trending upwards since dose held Wednesday Monitor CBC, s/sx of bleeding, HL, and INR daily     Bonnita Nasuti Pharm.D. CPP, BCPS Clinical Pharmacist [phone] 09/29/2021 7:52 AM    Please check AMION for all [place] phone numbers After 10:00 PM, call [place] [phone]

## 2021-09-29 NOTE — Progress Notes (Addendum)
  Progress Note    09/29/2021 8:27 AM 14 Days Post-Op  Subjective:  no complaints   Vitals:   09/29/21 0349 09/29/21 0740  BP: (!) 118/55 (!) 124/57  Pulse: 71 71  Resp: 19 18  Temp: 98.1 F (36.7 C) 98.1 F (36.7 C)  SpO2: 95% 96%   Physical Exam: Cardiac:  regular Lungs:  non labored Incisions:  right groin, right thigh incisions intact. Dressings clean and dry Extremities: brisk right Dp/PT signals  Abdomen:  soft, non distended Neurologic: alert and oriented   CBC    Component Value Date/Time   WBC 8.7 09/28/2021 0115   RBC 2.70 (L) 09/28/2021 0115   HGB 8.0 (L) 09/28/2021 0115   HCT 26.8 (L) 09/28/2021 0115   PLT 254 09/28/2021 0115   MCV 99.3 09/28/2021 0115   MCH 29.6 09/28/2021 0115   MCHC 29.9 (L) 09/28/2021 0115   RDW 17.1 (H) 09/28/2021 0115   LYMPHSABS 818 (L) 03/08/2021 1117   MONOABS 0.8 05/11/2019 1352   EOSABS 112 03/08/2021 1117   BASOSABS 33 03/08/2021 1117    BMET    Component Value Date/Time   NA 137 09/27/2021 0228   K 4.0 09/27/2021 0228   CL 103 09/27/2021 0228   CO2 28 09/27/2021 0228   GLUCOSE 114 (H) 09/27/2021 0228   BUN 16 09/27/2021 0228   CREATININE 0.97 09/27/2021 0228   CREATININE 0.90 03/08/2021 1117   CALCIUM 8.1 (L) 09/27/2021 0228   GFRNONAA >60 09/27/2021 0228   GFRNONAA 89 11/17/2019 1258   GFRAA 104 11/17/2019 1258    INR    Component Value Date/Time   INR 1.3 (H) 09/29/2021 0123     Intake/Output Summary (Last 24 hours) at 09/29/2021 0827 Last data filed at 09/29/2021 0351 Gross per 24 hour  Intake 600 ml  Output 1650 ml  Net -1050 ml     Assessment/Plan:  67 y.o. male is s/p R femoral to AK pop bypass with vein; subsequent take back for bleeding   14 Days Post-Op   Right lower extremity well perfused and warm with brisk doppler PT/ AT signals Right leg incisions intact. Dressings clean and dry. Continue Daily dressing changes H&H stable On heparin and Coumadin Plan to get INR to therapeutic level  before d/c Pain overall well controlled Continue to mobilize as tolerated   DVT prophylaxis: Coumadin     Karoline Caldwell, PA-C [facility] [phone] 09/29/2021 8:27 AM  VASCULAR STAFF ADDENDUM: I have independently interviewed and examined the patient. I agree with the above.   Yevonne Aline. Stanford Breed, MD [facility] of [facility] Phone Number: [phone] 09/29/2021 8:55 AM

## 2021-09-30 LAB — CBC
HCT: 27.5 % — ABNORMAL LOW (ref 39.0–52.0)
Hemoglobin: 8.5 g/dL — ABNORMAL LOW (ref 13.0–17.0)
MCH: 30.4 pg (ref 26.0–34.0)
MCHC: 30.9 g/dL (ref 30.0–36.0)
MCV: 98.2 fL (ref 80.0–100.0)
Platelets: 261 10*3/uL (ref 150–400)
RBC: 2.8 MIL/uL — ABNORMAL LOW (ref 4.22–5.81)
RDW: 17.2 % — ABNORMAL HIGH (ref 11.5–15.5)
WBC: 6.6 10*3/uL (ref 4.0–10.5)
nRBC: 0 % (ref 0.0–0.2)

## 2021-09-30 LAB — HEPARIN LEVEL (UNFRACTIONATED): Heparin Unfractionated: 0.66 IU/mL (ref 0.30–0.70)

## 2021-09-30 LAB — PROTIME-INR
INR: 1.4 — ABNORMAL HIGH (ref 0.8–1.2)
Prothrombin Time: 16.6 seconds — ABNORMAL HIGH (ref 11.4–15.2)

## 2021-09-30 MED ORDER — WARFARIN SODIUM 7.5 MG PO TABS
7.5000 mg | ORAL_TABLET | Freq: Once | ORAL | Status: AC
  Administered 2021-09-30: 7.5 mg via ORAL
  Filled 2021-09-30: qty 1

## 2021-09-30 NOTE — Progress Notes (Signed)
ANTICOAGULATION CONSULT NOTE  Pharmacy Consult for Heparin and Warfarin Indication: atrial fibrillation and mechanical MVR, PAD  Not on File  Patient Measurements: Height: '5\' 8"'$  (172.7 cm) Weight: 88.5 kg (195 lb) IBW/kg (Calculated) : 68.4  Heparin Dosing Weight: 86 kg  Vital Signs: Temp: 98.7 F (37.1 C) (06/24 0728) Temp Source: Oral (06/24 0728) BP: 119/46 (06/24 0728) Pulse Rate: 76 (06/24 0728)  Labs: Recent Labs    09/20/21 0806 09/21/21 0635 09/21/21 2054 09/22/21 0424  HGB 7.7* 7.2*  --  7.0*  HCT 22.8* 22.5*  --  22.7*  PLT 122* 139*  --  166  HEPARINUNFRC  --   --  <0.10* 0.13*    Estimated Creatinine Clearance: 97.1 mL/min (by C-G formula based on SCr of 0.82 mg/dL).   Assessment: 66 y.o. male on warfarin PTA for mechanical MVR/AVR, hx afib s/p R femoral to AK popliteal bypass with vein and subsequent hematoma evacuation 6/17. Pharmacy consulted to resume heparin slowly 6/23 per VVS and resume PTA warfarin.   PTA warfarin regimen (per 6/08 anticoagulation note): 7 mg Mon/Wed/Fri 6.5 mg all other days    Heparin level higher end of therapeutic 0.66, on heparin drip rate 1500 units/hr. INR is 1.4 after slight boost last pm of warfarin 7.'5mg'$  x2. Hgb 8, plt 254. No s/sx of bleeding or infusion issues.    Talked with patient and confirms has some supply of enoxaparin for bridge at home and is familiar with using it.  Goal of Therapy:  Heparin level 0.3-0.7 units/ml  INR 2.5-3.5 Monitor platelets by anticoagulation protocol: Yes   Plan:  Decrease heparin infusion at 1400 units/hr to prvent accumulation Will repeat warfarin 7.5 mg tonight to get INR trending upward Monitor CBC, s/sx of bleeding, HL, and INR daily     Bonnita Nasuti Pharm.D. CPP, BCPS Clinical Pharmacist [phone] 09/30/2021 9:28 AM    Please check AMION for all [place] phone numbers After 10:00 PM, call [place] [phone]

## 2021-09-30 NOTE — Progress Notes (Signed)
Mobility Specialist Progress Note    09/30/21 1527  Mobility  Activity Ambulated with assistance in hallway  Level of Assistance Minimal assist, patient does 75% or more  Assistive Device Front wheel walker  RLE Weight Bearing WBAT  Distance Ambulated (ft) 200 ft  Activity Response Tolerated well  $Mobility charge 1 Mobility   Pre-Mobility: 78 HR Post-Mobility: 82 HR  Pt received in bed and agreeable. No complaints on walk. Returned to [facility] to attempt BM with wife present.   Hildred Alamin Mobility Specialist

## 2021-09-30 NOTE — Progress Notes (Addendum)
  Progress Note    09/30/2021 8:56 AM 15 Days Post-Op  Subjective:  overall feels good. RN with some concern about malodorous drainage from incisions   Vitals:   09/30/21 0358 09/30/21 0737  BP: 119/67 97/77  Pulse: 68 71  Resp: 19 18  Temp: 98.4 F (36.9 C) 98.1 F (36.7 C)  SpO2: 96% 94%   Physical Exam: Cardiac:  regular Lungs:  non labored Incisions:  Right groin, right thigh incisions intact. Maceration of skin along incisions especially medial and distal right thigh. Some serosanguinous drainage. Dry dressings to incisions     Extremities:  well perfused and warm with brisk Dp and PT signals Abdomen:  soft, flat Neurologic: alert and oriented  CBC    Component Value Date/Time   WBC 6.6 09/30/2021 0215   RBC 2.80 (L) 09/30/2021 0215   HGB 8.5 (L) 09/30/2021 0215   HCT 27.5 (L) 09/30/2021 0215   PLT 261 09/30/2021 0215   MCV 98.2 09/30/2021 0215   MCH 30.4 09/30/2021 0215   MCHC 30.9 09/30/2021 0215   RDW 17.2 (H) 09/30/2021 0215   LYMPHSABS 818 (L) 03/08/2021 1117   MONOABS 0.8 05/11/2019 1352   EOSABS 112 03/08/2021 1117   BASOSABS 33 03/08/2021 1117    BMET    Component Value Date/Time   NA 137 09/27/2021 0228   K 4.0 09/27/2021 0228   CL 103 09/27/2021 0228   CO2 28 09/27/2021 0228   GLUCOSE 114 (H) 09/27/2021 0228   BUN 16 09/27/2021 0228   CREATININE 0.97 09/27/2021 0228   CREATININE 0.90 03/08/2021 1117   CALCIUM 8.1 (L) 09/27/2021 0228   GFRNONAA >60 09/27/2021 0228   GFRNONAA 89 11/17/2019 1258   GFRAA 104 11/17/2019 1258    INR    Component Value Date/Time   INR 1.4 (H) 09/30/2021 0215     Intake/Output Summary (Last 24 hours) at 09/30/2021 0856 Last data filed at 09/30/2021 0600 Gross per 24 hour  Intake 588.72 ml  Output 1450 ml  Net -861.28 ml     Assessment/Plan:  66 y.o. male is s/p R femoral to AK pop bypass with vein; subsequent take back for bleeding  15 Days Post-Op   Right lower extremity well perfused and warm  with brisk doppler PT/ AT signals Right leg incisions intact. Change to dry dressings only twice daily changes due to maceration H&H stable On heparin and Coumadin Continue to mobilize as tolerated Dispo home when INR therapeutic  DVT prophylaxis:  coumadin   Karoline Caldwell, PA-C [facility] [phone] 09/30/2021 8:56 AM  VASCULAR STAFF ADDENDUM: I have independently interviewed and examined the patient. I agree with the above.  INR 1.4 Wounds macerated, but overall appear OK. Start dry dressing changes twice daily and as needed.   Yevonne Aline. Stanford Breed, MD [facility] of [facility] Phone Number: [phone] 09/30/2021 9:10 AM

## 2021-10-01 LAB — CBC
HCT: 29.4 % — ABNORMAL LOW (ref 39.0–52.0)
Hemoglobin: 8.6 g/dL — ABNORMAL LOW (ref 13.0–17.0)
MCH: 29.2 pg (ref 26.0–34.0)
MCHC: 29.3 g/dL — ABNORMAL LOW (ref 30.0–36.0)
MCV: 99.7 fL (ref 80.0–100.0)
Platelets: 260 10*3/uL (ref 150–400)
RBC: 2.95 MIL/uL — ABNORMAL LOW (ref 4.22–5.81)
RDW: 17.2 % — ABNORMAL HIGH (ref 11.5–15.5)
WBC: 6.5 10*3/uL (ref 4.0–10.5)
nRBC: 0 % (ref 0.0–0.2)

## 2021-10-01 LAB — PROTIME-INR
INR: 1.5 — ABNORMAL HIGH (ref 0.8–1.2)
Prothrombin Time: 18.1 seconds — ABNORMAL HIGH (ref 11.4–15.2)

## 2021-10-01 LAB — HEPARIN LEVEL (UNFRACTIONATED): Heparin Unfractionated: 0.43 IU/mL (ref 0.30–0.70)

## 2021-10-01 MED ORDER — WARFARIN SODIUM 5 MG PO TABS
7.0000 mg | ORAL_TABLET | Freq: Once | ORAL | Status: AC
  Administered 2021-10-01: 7 mg via ORAL
  Filled 2021-10-01: qty 1

## 2021-10-01 NOTE — Progress Notes (Addendum)
  Progress Note    10/01/2021 7:47 AM 16 Days Post-Op  Subjective:  no complaints   Vitals:   10/01/21 0415 10/01/21 0735  BP: 130/66 122/60  Pulse: 72   Resp: 14 19  Temp: 97.9 F (36.6 C) 98.2 F (36.8 C)  SpO2: 97% 97%   Physical Exam: Cardiac:  regular Lungs:  non labored Incisions: Right groin and right thing incisions intact. Serosanguinous drainage. Dry Dressings applied Extremities: Well perfused and warm. Doppler PT/DP signals Neurologic: alert and oriented  CBC    Component Value Date/Time   WBC 6.5 10/01/2021 0336   RBC 2.95 (L) 10/01/2021 0336   HGB 8.6 (L) 10/01/2021 0336   HCT 29.4 (L) 10/01/2021 0336   PLT 260 10/01/2021 0336   MCV 99.7 10/01/2021 0336   MCH 29.2 10/01/2021 0336   MCHC 29.3 (L) 10/01/2021 0336   RDW 17.2 (H) 10/01/2021 0336   LYMPHSABS 818 (L) 03/08/2021 1117   MONOABS 0.8 05/11/2019 1352   EOSABS 112 03/08/2021 1117   BASOSABS 33 03/08/2021 1117    BMET    Component Value Date/Time   NA 137 09/27/2021 0228   K 4.0 09/27/2021 0228   CL 103 09/27/2021 0228   CO2 28 09/27/2021 0228   GLUCOSE 114 (H) 09/27/2021 0228   BUN 16 09/27/2021 0228   CREATININE 0.97 09/27/2021 0228   CREATININE 0.90 03/08/2021 1117   CALCIUM 8.1 (L) 09/27/2021 0228   GFRNONAA >60 09/27/2021 0228   GFRNONAA 89 11/17/2019 1258   GFRAA 104 11/17/2019 1258    INR    Component Value Date/Time   INR 1.5 (H) 10/01/2021 0336     Intake/Output Summary (Last 24 hours) at 10/01/2021 0747 Last data filed at 10/01/2021 0418 Gross per 24 hour  Intake 763.87 ml  Output 1100 ml  Net -336.13 ml     Assessment/Plan:  66 y.o. male is s/p  R femoral to AK pop bypass with vein; subsequent take back for bleeding   16 Days Post-Op   Right lower extremity well perfused and warm with brisk doppler PT/ AT signals Right leg incisions intact. Dry dressings BID H&H stable Afebrile. No leukocytosis On heparin and Coumadin INR 1.5 today Dispo home when INR  therapeutic Continue to mobilize as tolerated   DVT prophylaxis:  Coumadin    Karoline Caldwell, PA-C [facility] [phone] 10/01/2021 7:47 AM  VASCULAR STAFF ADDENDUM: I agree with the above.  Patient sleeping soundly.  I did not wake him today. Images of wounds reviewed.  Continue dry dressing care for macerated skin.  Changed twice daily and as needed. Awaiting therapeutic INR.  Yevonne Aline. Stanford Breed, MD [facility] of [facility] Phone Number: [phone] 10/01/2021 12:21 PM

## 2021-10-01 NOTE — Progress Notes (Signed)
Mobility Specialist: Progress Note   10/01/21 1015  Mobility  Activity Ambulated with assistance in hallway  Level of Assistance Standby assist, set-up cues, supervision of patient - no hands on  Assistive Device Front wheel walker  RLE Weight Bearing WBAT  Distance Ambulated (ft) 260 ft  Activity Response Tolerated well  $Mobility charge 1 Mobility   Pre-Mobility: 77 HR Post-Mobility: 75 HR  Pt received in the chair and agreeable to mobility. C/o RLE pain he rated 2/10, otherwise asymptomatic. Pt back to bed after session per request. Call bell and phone at his side.   [facility] Flavio Lindroth Mobility Specialist Mobility Specialist [address]: [phone]

## 2021-10-01 NOTE — Progress Notes (Signed)
ANTICOAGULATION CONSULT NOTE  Pharmacy Consult for Heparin and Warfarin Indication: atrial fibrillation and mechanical MVR, PAD    Patient Measurements: Height: '5\' 8"'$  (172.7 cm) Weight: 88.5 kg (195 lb) IBW/kg (Calculated) : 68.4  Heparin Dosing Weight: 86 kg  Vital Signs: Temp: 98.7 F (37.1 C) (06/24 0728) Temp Source: Oral (06/24 0728) BP: 119/46 (06/24 0728) Pulse Rate: 76 (06/24 0728)  Labs: Recent Labs    09/20/21 0806 09/21/21 0635 09/21/21 2054 09/22/21 0424  HGB 7.7* 7.2*  --  7.0*  HCT 22.8* 22.5*  --  22.7*  PLT 122* 139*  --  166  HEPARINUNFRC  --   --  <0.10* 0.13*    Estimated Creatinine Clearance: 97.1 mL/min (by C-G formula based on SCr of 0.82 mg/dL).   Assessment: 66 y.o. male on warfarin PTA for mechanical MVR/AVR, hx afib s/p R femoral to AK popliteal bypass with vein and subsequent hematoma evacuation 6/17. Pharmacy consulted to resume heparin slowly 6/23 per VVS and resume PTA warfarin.   PTA warfarin regimen (per 6/08 anticoagulation note): 7 mg Mon/Wed/Fri 6.5 mg all other days    Heparin level at goal on 1400 units/hr.  INR is 1.5 after slight boost of warfarin to 7.'5mg'$  daily x 3. Hgb 8.6, plt 260. No s/sx of bleeding or infusion issues.    Previous pharmacist spoke with patient and confirms has some supply of enoxaparin for bridge at home and is familiar with using it in the event that patient is discharged before INR therapeutic.  Goal of Therapy:  Heparin level 0.3-0.7 units/ml  INR 2.5-3.5 Monitor platelets by anticoagulation protocol: Yes   Plan:  Continue heparin infusion at 1400 units/hr  Warfarin 7 mg tonight per home regimen  Monitor CBC, s/sx of bleeding, heparin level, and INR daily    [employer], Pharm.D., BCPS Clinical Pharmacist  **Pharmacist phone directory can be found on [email or web address] listed under [address].  10/01/2021 9:21 AM

## 2021-10-01 NOTE — Progress Notes (Signed)
Occupational Therapy Treatment Patient Details Name: Rhylee Nunn MRN: [id number] DOB: 26-Sep-1955 Today's Date: 10/01/2021   History of present illness Pt adm 6/16 for rt femoral popliteal bypass graft. Underwent emergent surgery 6/17 for hemorrhagic shock,  reexploration of RLE, angiogram of right lower extremity and application of wound vac  PMH - RLE ulcer, copd, cad, HTN, AVR, MVR, PVD, CVA   OT comments  Pt very motivated, reports having ambulated x2 with PT and then mobility tech, focused on don/doff of scrotal sling to address edema and bed mobility. Pt reports that the sling rubs on his incision. Provided pillow case and explained benefits of material for moisture wicking and use to come EOB with less pain/discomfort. Pt able to demonstrate. Pt left supine in the bed with pillow case under scrotum, towel elevating. Pt eating lunch. OT will continue to follow acutely, POC remains appropriate.    Recommendations for follow up therapy are one component of a multi-disciplinary discharge planning process, led by the attending physician.  Recommendations may be updated based on patient status, additional functional criteria and insurance authorization.    Follow Up Recommendations  Home health OT    Assistance Recommended at Discharge Intermittent Supervision/Assistance  Patient can return home with the following  A little help with bathing/dressing/bathroom;Assistance with cooking/housework;Assist for transportation   Equipment Recommendations  BSC/3in1    Recommendations for Other Services      Precautions / Restrictions Precautions Required Braces or Orthoses: Other Brace Other Brace: scrotal sling for mobility Restrictions Weight Bearing Restrictions: Yes RLE Weight Bearing: Weight bearing as tolerated       Mobility Bed Mobility Overal bed mobility: Needs Assistance Bed Mobility: Supine to Sit, Sit to Supine     Supine to sit: Supervision, HOB elevated Sit to supine:  Supervision   General bed mobility comments: using bed rail, able to mobilize RLE without strap, increased time, reports improvement in scrotal discomfort with pillow case    Transfers                   General transfer comment: declined due to having mobilized with PT and mobility tech today     Balance Overall balance assessment: Needs assistance Sitting-balance support: Feet supported, No upper extremity supported Sitting balance-Leahy Scale: Good Sitting balance - Comments: required supporta at times to hold self up due to dizziness and "tightness" in leg                                   ADL either performed or assessed with clinical judgement   ADL Overall ADL's : Needs assistance/impaired     Grooming: Wash/dry hands;Set up;Sitting Grooming Details (indicate cue type and reason): performed HOB elevated                     Toileting- Clothing Manipulation and Hygiene: Moderate assistance;Bed level Toileting - Clothing Manipulation Details (indicate cue type and reason): for scrotal sling education/management     Functional mobility during ADLs: Min guard;Rolling walker (2 wheels);Cueing for safety      Extremity/Trunk Assessment              Vision   Vision Assessment?: No apparent visual deficits   Perception     Praxis      Cognition Arousal/Alertness: Awake/alert Behavior During Therapy: WFL for tasks assessed/performed Overall Cognitive Status: Within Functional Limits for tasks assessed  General Comments: motivated to return home and for session        Exercises      Shoulder Instructions       General Comments wife present throughout    Pertinent Vitals/ Pain       Pain Assessment Pain Assessment: Faces Faces Pain Scale: Hurts little more Pain Location: RLE Pain Descriptors / Indicators: Discomfort, Grimacing Pain Intervention(s): Monitored during session,  Limited activity within patient's tolerance  Home Living                                          Prior Functioning/Environment              Frequency  Min 2X/week        Progress Toward Goals  OT Goals(current goals can now be found in the care plan section)  Progress towards OT goals: Progressing toward goals  Acute Rehab OT Goals Patient Stated Goal: get home to finish converting garage into house for daughter OT Goal Formulation: With patient Time For Goal Achievement: 10/15/21 Potential to Achieve Goals: Good ADL Goals Pt Will Perform Lower Body Bathing: with modified independence;with adaptive equipment;sit to/from stand Pt Will Perform Lower Body Dressing: with modified independence;with adaptive equipment;sit to/from stand Pt Will Transfer to Toilet: with modified independence;ambulating Pt Will Perform Toileting - Clothing Manipulation and hygiene: with modified independence;sit to/from stand Additional ADL Goal #1: Pt will independently manage scrotal sling to reduce pain and edema and increase independence with ADL/mobility  Plan Discharge plan needs to be updated    Co-evaluation                 AM-PAC OT "6 Clicks" Daily Activity     Outcome Measure   Help from another person eating meals?: None Help from another person taking care of personal grooming?: None Help from another person toileting, which includes using toliet, bedpan, or urinal?: A Lot Help from another person bathing (including washing, rinsing, drying)?: A Lot Help from another person to put on and taking off regular upper body clothing?: A Little Help from another person to put on and taking off regular lower body clothing?: A Lot 6 Click Score: 17    End of Session Equipment Utilized During Treatment: Other (comment) (modified scrotal sling)  OT Visit Diagnosis: Unsteadiness on feet (R26.81);Other abnormalities of gait and mobility (R26.89);Muscle weakness  (generalized) (M62.81);Pain Pain - Right/Left: Right Pain - part of body: Leg   Activity Tolerance Patient tolerated treatment well   Patient Left with call bell/phone within reach;in bed;with family/visitor present   Nurse Communication Mobility status        Time: 6812-7517 OT Time Calculation (min): 26 min  Charges: OT General Charges $OT Visit: 1 Visit OT Treatments $Self Care/Home Management : 8-22 mins  Jesse Sans OTR/L Acute Rehabilitation Services Office: [place] 10/01/2021, 2:18 PM

## 2021-10-01 NOTE — Progress Notes (Signed)
Physical Therapy Treatment Patient Details Name: Jamarkis Branam MRN: [id number] DOB: 1955/12/29 Today's Date: 10/01/2021   History of Present Illness Pt adm 6/16 for rt femoral popliteal bypass graft. Underwent emergent surgery 6/17 for hemorrhagic shock,  reexploration of RLE, angiogram of right lower extremity and application of wound vac  PMH - RLE ulcer, copd, cad, HTN, AVR, MVR, PVD, CVA    PT Comments    Pt progressing well with mobility. Supervision bed mobility, min guard assist transfers, min guard progressing to supervision ambulation 300' with RW, and min guard ascend/descend 3 steps with R rail. Pt able to get RLE OOB without use of leg lifter. Focused on heel strike/toe off RLE during gait. Pt required increased time to complete all functional mobility due to RLE pain. Pt in recliner with feet elevated at end of session.    Recommendations for follow up therapy are one component of a multi-disciplinary discharge planning process, led by the attending physician.  Recommendations may be updated based on patient status, additional functional criteria and insurance authorization.  Follow Up Recommendations  No PT follow up     Assistance Recommended at Discharge PRN  Patient can return home with the following Assist for transportation   Equipment Recommendations  None recommended by PT    Recommendations for Other Services       Precautions / Restrictions Precautions Precautions: None Restrictions RLE Weight Bearing: Weight bearing as tolerated     Mobility  Bed Mobility Overal bed mobility: Needs Assistance Bed Mobility: Supine to Sit     Supine to sit: Supervision, HOB elevated     General bed mobility comments: using bed rail, able to mobilize RLE without strap, increased time    Transfers Overall transfer level: Needs assistance Equipment used: Rolling walker (2 wheels) Transfers: Sit to/from Stand Sit to Stand: Min guard           General transfer  comment: increased time    Ambulation/Gait Ambulation/Gait assistance: Supervision, Min guard Gait Distance (Feet): 300 Feet Assistive device: Rolling walker (2 wheels) Gait Pattern/deviations: Step-through pattern, Trunk flexed, Decreased stride length, Decreased weight shift to right, Antalgic Gait velocity: decreased Gait velocity interpretation: <1.31 ft/sec, indicative of household ambulator   General Gait Details: initially required min guard assist but progressed to supervision as gait pattern improved. Concentrated on heel strike and toe off RLE.   Stairs   Stairs assistance: Min guard Stair Management: Step to pattern, Sideways, One rail Right Number of Stairs: 3     Wheelchair Mobility    Modified Rankin (Stroke Patients Only)       Balance Overall balance assessment: Needs assistance Sitting-balance support: Feet supported, No upper extremity supported Sitting balance-Leahy Scale: Good     Standing balance support: Bilateral upper extremity supported, During functional activity, No upper extremity supported Standing balance-Leahy Scale: Fair Standing balance comment: RW for amb to [employer] RLE                            Cognition Arousal/Alertness: Awake/alert Behavior During Therapy: WFL for tasks assessed/performed Overall Cognitive Status: Within Functional Limits for tasks assessed                                          Exercises      General Comments General comments (skin integrity, edema, etc.): VSS on RA  Pertinent Vitals/Pain Pain Assessment Pain Assessment: Faces Faces Pain Scale: Hurts little more Pain Location: RLE Pain Descriptors / Indicators: Discomfort, Grimacing Pain Intervention(s): Monitored during session, Repositioned    Home Living                          Prior Function            PT Goals (current goals can now be found in the care plan section) Acute Rehab PT  Goals Patient Stated Goal: return home PT Goal Formulation: With patient Time For Goal Achievement: 10/15/21 Potential to Achieve Goals: Good Progress towards PT goals: Progressing toward goals    Frequency    Min 3X/week      PT Plan Current plan remains appropriate    Co-evaluation              AM-PAC PT "6 Clicks" Mobility   Outcome Measure  Help needed turning from your back to your side while in a flat bed without using bedrails?: None Help needed moving from lying on your back to sitting on the side of a flat bed without using bedrails?: A Little Help needed moving to and from a bed to a chair (including a wheelchair)?: A Little Help needed standing up from a chair using your arms (e.g., wheelchair or bedside chair)?: A Little Help needed to walk in hospital room?: A Little Help needed climbing 3-5 steps with a railing? : A Little 6 Click Score: 19    End of Session Equipment Utilized During Treatment: Gait belt Activity Tolerance: Patient tolerated treatment well Patient left: in chair;with call bell/phone within reach Nurse Communication: Mobility status PT Visit Diagnosis: Other abnormalities of gait and mobility (R26.89);Pain Pain - Right/Left: Right Pain - part of body: Leg     Time: 7673-4193 PT Time Calculation (min) (ACUTE ONLY): 26 min  Charges:  $Gait Training: 23-37 mins                     Lorrin Goodell, [place]  Office # [phone] Pager [phone]    Lorriane Shire 10/01/2021, 9:11 AM

## 2021-10-02 LAB — CBC
HCT: 30 % — ABNORMAL LOW (ref 39.0–52.0)
Hemoglobin: 9.1 g/dL — ABNORMAL LOW (ref 13.0–17.0)
MCH: 29.9 pg (ref 26.0–34.0)
MCHC: 30.3 g/dL (ref 30.0–36.0)
MCV: 98.7 fL (ref 80.0–100.0)
Platelets: 244 10*3/uL (ref 150–400)
RBC: 3.04 MIL/uL — ABNORMAL LOW (ref 4.22–5.81)
RDW: 17.2 % — ABNORMAL HIGH (ref 11.5–15.5)
WBC: 6.4 10*3/uL (ref 4.0–10.5)
nRBC: 0 % (ref 0.0–0.2)

## 2021-10-02 LAB — PROTIME-INR
INR: 1.5 — ABNORMAL HIGH (ref 0.8–1.2)
Prothrombin Time: 18.2 seconds — ABNORMAL HIGH (ref 11.4–15.2)

## 2021-10-02 LAB — HEPARIN LEVEL (UNFRACTIONATED): Heparin Unfractionated: 0.39 IU/mL (ref 0.30–0.70)

## 2021-10-02 MED ORDER — WARFARIN SODIUM 10 MG PO TABS
10.0000 mg | ORAL_TABLET | Freq: Once | ORAL | Status: AC
Start: 2021-10-02 — End: 2021-10-02
  Administered 2021-10-02: 10 mg via ORAL
  Filled 2021-10-02: qty 1

## 2021-10-02 MED ORDER — WARFARIN SODIUM 7.5 MG PO TABS
7.5000 mg | ORAL_TABLET | Freq: Once | ORAL | Status: DC
Start: 2021-10-02 — End: 2021-10-02

## 2021-10-02 NOTE — Progress Notes (Signed)
Mobility Specialist: Progress Note   10/02/21 1404  Mobility  Activity Ambulated with assistance in hallway  Level of Assistance Standby assist, set-up cues, supervision of patient - no hands on  Assistive Device Front wheel walker  RLE Weight Bearing WBAT  Distance Ambulated (ft) 390 ft  Activity Response Tolerated well  $Mobility charge 1 Mobility   Pre-Mobility: 75 HR Post-Mobility: 75 HR  Pt received in the bed and agreeable to mobility. Stopped x1 for brief standing break secondary to feeling light headed, resolved quickly. Pt sitting EOB after session with call bell at his side. Pt's wife present in the room.   [facility] Tenia Goh Mobility Specialist Mobility Specialist [address]: [phone]

## 2021-10-02 NOTE — Progress Notes (Addendum)
ANTICOAGULATION CONSULT NOTE  Pharmacy Consult for Heparin and Warfarin Indication: atrial fibrillation and mechanical MVR, PAD  Patient Measurements: Height: '5\' 8"'$  (172.7 cm) Weight: 88.5 kg (195 lb) IBW/kg (Calculated) : 68.4  Heparin Dosing Weight: 86 kg  Vital Signs: Temp: 98.7 F (37.1 C) (06/24 0728) Temp Source: Oral (06/24 0728) BP: 119/46 (06/24 0728) Pulse Rate: 76 (06/24 0728)  Labs: Recent Labs    09/20/21 0806 09/21/21 0635 09/21/21 2054 09/22/21 0424  HGB 7.7* 7.2*  --  7.0*  HCT 22.8* 22.5*  --  22.7*  PLT 122* 139*  --  166  HEPARINUNFRC  --   --  <0.10* 0.13*    Estimated Creatinine Clearance: 97.1 mL/min (by C-G formula based on SCr of 0.82 mg/dL).   Assessment: 66 y.o. male on warfarin PTA for mechanical MVR/AVR, hx afib s/p R femoral to [place] popliteal bypass with vein and subsequent hematoma evacuation 6/17. Pharmacy consulted to resume heparin slowly on 6/23 per [facility]VS and resume PTA warfarin.   PTA warfarin regimen (per 6/08 anticoagulation note): 7 mg Mon/Wed/Fri 6.5 mg all other days    Heparin level at goal on 1400 units/hr.  INR is 1.5 x 2 after slight boost of warfarin 7.'5mg'$  daily x 3, then 7 mg x 1. Hgb 9.1, plt 244. No s/sx of bleeding or infusion issues.   Previous pharmacist spoke with patient and confirmed he has some supply of enoxaparin for bridge at home and is familiar with using it in the event that patient is discharged before INR therapeutic.  Goal of Therapy:  Heparin level 0.3-0.7 units/ml  INR 2.5-3.5 Monitor platelets by anticoagulation protocol: Yes   Plan:  Continue heparin infusion at 1400 units/hr  Warfarin 10 mg tonight  Monitor CBC, s/sx of bleeding, heparin level, and INR daily    Thank you for allowing pharmacy to participate in this patient's care.  Reatha Harps, PharmD PGY1 Pharmacy Resident 10/02/2021 7:03 AM Check [email or web address] for unit specific pharmacy number

## 2021-10-02 NOTE — Progress Notes (Addendum)
  Progress Note    10/02/2021 8:00 AM 17 Days Post-Op  Subjective:  no complaints   Vitals:   10/02/21 0349 10/02/21 0755  BP: (!) 121/55 137/60  Pulse: 68 70  Resp: 17 18  Temp: 98 F (36.7 C) 98.1 F (36.7 C)  SpO2: 98% 97%   Physical Exam: Lungs:  non labored Incisions:  dressing left in place R leg Extremities:  brisk PT and DP by doppler Neurologic: A&O  CBC    Component Value Date/Time   WBC 6.4 10/02/2021 0028   RBC 3.04 (L) 10/02/2021 0028   HGB 9.1 (L) 10/02/2021 0028   HCT 30.0 (L) 10/02/2021 0028   PLT 244 10/02/2021 0028   MCV 98.7 10/02/2021 0028   MCH 29.9 10/02/2021 0028   MCHC 30.3 10/02/2021 0028   RDW 17.2 (H) 10/02/2021 0028   LYMPHSABS 818 (L) 03/08/2021 1117   MONOABS 0.8 05/11/2019 1352   EOSABS 112 03/08/2021 1117   BASOSABS 33 03/08/2021 1117    BMET    Component Value Date/Time   NA 137 09/27/2021 0228   K 4.0 09/27/2021 0228   CL 103 09/27/2021 0228   CO2 28 09/27/2021 0228   GLUCOSE 114 (H) 09/27/2021 0228   BUN 16 09/27/2021 0228   CREATININE 0.97 09/27/2021 0228   CREATININE 0.90 03/08/2021 1117   CALCIUM 8.1 (L) 09/27/2021 0228   GFRNONAA >60 09/27/2021 0228   GFRNONAA 89 11/17/2019 1258   GFRAA 104 11/17/2019 1258    INR    Component Value Date/Time   INR 1.5 (H) 10/02/2021 0028     Intake/Output Summary (Last 24 hours) at 10/02/2021 0800 Last data filed at 10/02/2021 0350 Gross per 24 hour  Intake 484.72 ml  Output 1650 ml  Net -1165.28 ml     Assessment/Plan:  66 y.o. male is s/p R femoral to AK pop bypass with vein; subsequent take back for bleeding 17 Days Post-Op   RLE well perfused based on doppler exam Continue dressing changes R thigh H&H stable; continue heparin and coumadin; can possibly d/c home on lovenox if INR closer to 2 tomorrow    Dagoberto Ligas, PA-C [facility] [phone] 10/02/2021 8:00 AM  VASCULAR STAFF ADDENDUM: I have independently interviewed and examined  the patient. I agree with the above.   Yevonne Aline. Stanford Breed, MD [facility] Phone Number: [phone] 10/02/2021 11:13 AM

## 2021-10-03 LAB — CBC
HCT: 30.9 % — ABNORMAL LOW (ref 39.0–52.0)
Hemoglobin: 9.5 g/dL — ABNORMAL LOW (ref 13.0–17.0)
MCH: 30.3 pg (ref 26.0–34.0)
MCHC: 30.7 g/dL (ref 30.0–36.0)
MCV: 98.4 fL (ref 80.0–100.0)
Platelets: 273 10*3/uL (ref 150–400)
RBC: 3.14 MIL/uL — ABNORMAL LOW (ref 4.22–5.81)
RDW: 17.4 % — ABNORMAL HIGH (ref 11.5–15.5)
WBC: 5.8 10*3/uL (ref 4.0–10.5)
nRBC: 0 % (ref 0.0–0.2)

## 2021-10-03 LAB — HEPARIN LEVEL (UNFRACTIONATED): Heparin Unfractionated: 0.59 IU/mL (ref 0.30–0.70)

## 2021-10-03 LAB — PROTIME-INR
INR: 1.7 — ABNORMAL HIGH (ref 0.8–1.2)
Prothrombin Time: 20.1 seconds — ABNORMAL HIGH (ref 11.4–15.2)

## 2021-10-03 MED ORDER — WARFARIN SODIUM 7.5 MG PO TABS
7.5000 mg | ORAL_TABLET | Freq: Once | ORAL | Status: AC
  Administered 2021-10-03: 7.5 mg via ORAL
  Filled 2021-10-03: qty 1

## 2021-10-03 NOTE — Progress Notes (Signed)
Physical Therapy Treatment Patient Details Name: [name] MRN: [id number] DOB: 1955-11-08 Today's Date: 10/03/2021   History of Present Illness Pt adm 6/16 for rt femoral popliteal bypass graft. Underwent emergent surgery 6/17 for hemorrhagic shock,  reexploration of RLE, angiogram of right lower extremity and application of wound vac  PMH - RLE ulcer, copd, cad, HTN, AVR, MVR, PVD, CVA    PT Comments    Continues to make good progress towards functional goals. Supervision - Min guard with transfer and ambulation activities today. Navigating with RW up to 570 feet, slowly improving gait mechanics. Remains motivated, hopeful to go home this week. Patient will continue to benefit from skilled physical therapy services to further improve independence with functional mobility.    Recommendations for follow up therapy are one component of a multi-disciplinary discharge planning process, led by the attending physician.  Recommendations may be updated based on patient status, additional functional criteria and insurance authorization.  Follow Up Recommendations  No PT follow up     Assistance Recommended at Discharge PRN  Patient can return home with the following Assist for transportation   Equipment Recommendations  None recommended by PT    Recommendations for Other Services       Precautions / Restrictions Precautions Precautions: None Required Braces or Orthoses: Other Brace Other Brace: scrotal sling for mobility Restrictions Weight Bearing Restrictions: Yes RLE Weight Bearing: Weight bearing as tolerated     Mobility  Bed Mobility Overal bed mobility: Needs Assistance Bed Mobility: Sit to Supine       Sit to supine: Supervision, HOB elevated   General bed mobility comments: Supervision for safety, able to adequately bring LEs into bed with HOB elevated due to discomfort with hip extension/supine.    Transfers Overall transfer level: Needs assistance Equipment  used: Rolling walker (2 wheels) Transfers: Sit to/from Stand Sit to Stand: Min guard           General transfer comment: Min guard for safety, good power-up from recliner. Good control with descent onto bed, slower and guarded without issues of LOB.    Ambulation/Gait Ambulation/Gait assistance: Supervision Gait Distance (Feet): 570 Feet Assistive device: Rolling walker (2 wheels) Gait Pattern/deviations: Step-through pattern, Trunk flexed, Decreased stride length, Decreased weight shift to right, Antalgic Gait velocity: decreased     General Gait Details: Supervision for duration of distance. Slow, guarded, and antalgic but progressed gradually, eventually able to lead with Rt heel strike 50% of the time. Cues for proximity to AD and upright stance as tolerated. Rt medial groin pain reported on heel strike. No LOB noted with AD, using UEs moderately to unload RLE.   Stairs             Wheelchair Mobility    Modified Rankin (Stroke Patients Only)       Balance Overall balance assessment: Needs assistance Sitting-balance support: Feet supported, No upper extremity supported Sitting balance-Leahy Scale: Good Sitting balance - Comments: able to keep balance on EOB without assistance   Standing balance support: No upper extremity supported Standing balance-Leahy Scale: Fair                              Cognition Arousal/Alertness: Awake/alert Behavior During Therapy: WFL for tasks assessed/performed Overall Cognitive Status: Within Functional Limits for tasks assessed  General Comments: motivated to get out of bed        Exercises      General Comments        Pertinent Vitals/Pain Pain Assessment Pain Assessment: Faces Faces Pain Scale: Hurts little more Pain Location: RLE Pain Descriptors / Indicators: Discomfort, Grimacing Pain Intervention(s): Monitored during session, Repositioned    Home  Living                          Prior Function            PT Goals (current goals can now be found in the care plan section) Acute Rehab PT Goals Patient Stated Goal: return home PT Goal Formulation: With patient Time For Goal Achievement: 10/15/21 Potential to Achieve Goals: Good Progress towards PT goals: Progressing toward goals    Frequency    Min 3X/week      PT Plan Current plan remains appropriate    Co-evaluation              AM-PAC PT "6 Clicks" Mobility   Outcome Measure  Help needed turning from your back to your side while in a flat bed without using bedrails?: None Help needed moving from lying on your back to sitting on the side of a flat bed without using bedrails?: A Little Help needed moving to and from a bed to a chair (including a wheelchair)?: A Little Help needed standing up from a chair using your arms (e.g., wheelchair or bedside chair)?: A Little Help needed to walk in hospital room?: A Little Help needed climbing 3-5 steps with a railing? : A Little 6 Click Score: 19    End of Session Equipment Utilized During Treatment: Gait belt Activity Tolerance: Patient tolerated treatment well Patient left: in bed   PT Visit Diagnosis: Other abnormalities of gait and mobility (R26.89);Pain Pain - Right/Left: Right Pain - part of body: Leg     Time: 1131-1155 PT Time Calculation (min) (ACUTE ONLY): 24 min  Charges:  $Gait Training: 23-37 mins                     Candie Mile, PT    Ellouise Newer 10/03/2021, 12:49 PM

## 2021-10-03 NOTE — Progress Notes (Signed)
ANTICOAGULATION CONSULT NOTE  Pharmacy Consult for Heparin and Warfarin Indication: atrial fibrillation and mechanical MVR, PAD  Patient Measurements: Height: '5\' 8"'$  (172.7 cm) Weight: 88.5 kg (195 lb) IBW/kg (Calculated) : 68.4  Heparin Dosing Weight: 86 kg  Vital Signs: Temp: 98.7 F (37.1 C) (06/24 0728) Temp Source: Oral (06/24 0728) BP: 119/46 (06/24 0728) Pulse Rate: 76 (06/24 0728)  Labs: Recent Labs    10/01/21 0336 10/02/21 0028 10/03/21 0115  HGB 8.6* 9.1* 9.5*  HCT 29.4* 30.0* 30.9*  PLT 260 244 273  LABPROT 18.1* 18.2* 20.1*  INR 1.5* 1.5* 1.7*  HEPARINUNFRC 0.43 0.39 0.59    Estimated Creatinine Clearance: 82 mL/min (by C-G formula based on SCr of 0.97 mg/dL).   Assessment: 66 y.o. male on warfarin PTA for mechanical MVR/AVR, hx afib s/p R femoral to AK popliteal bypass with vein and subsequent hematoma evacuation 6/17. Pharmacy consulted to resume heparin slowly on 6/23 per VVS and resume PTA warfarin.   PTA warfarin regimen (per 6/08 anticoagulation note): 7 mg Mon/Wed/Fri 6.5 mg all other days    Heparin level at goal on 1400 units/hr.  INR with slight increase (1.5 > 1.7) after slight boost of warfarin 7.'5mg'$  daily x 3, then 10 mg x 1. Hgb 9.5, plt 273. No s/sx of bleeding or infusion issues.   Previous pharmacist spoke with patient and confirmed he has some supply of enoxaparin for bridge at home and is familiar with using it in the event that patient is discharged before INR therapeutic.  Goal of Therapy:  Heparin level 0.3-0.7 units/ml  INR 2.5-3.5 Monitor platelets by anticoagulation protocol: Yes   Plan:  Continue heparin infusion at 1400 units/hr  Warfarin 7.5 mg tonight  Monitor CBC, s/sx of bleeding, heparin level, and INR daily   [employer], Pharm.D., BCPS Clinical Pharmacist Clinical phone for 10/03/2021 from 7:30-3:00 is [phone].  **Pharmacist phone directory can be found on [email or web address] listed under [place].  10/03/2021 8:09  AM

## 2021-10-03 NOTE — Progress Notes (Signed)
Occupational Therapy Treatment Patient Details Name: [name] MRN: [id number] DOB: 1955/04/23 Today's Date: 10/03/2021   History of present illness Pt adm 6/16 for rt femoral popliteal bypass graft. Underwent emergent surgery 6/17 for hemorrhagic shock,  reexploration of RLE, angiogram of right lower extremity and application of wound vac  PMH - RLE ulcer, copd, cad, HTN, AVR, MVR, PVD, CVA   OT comments  Patient received in bed and eager to participate. Patient able to get to EOB with use of gait belt as leg lifter and increased time. Patient able to ambulate to sink and perform grooming tasks with min guard and returned to recliner with setup for breakfast. Patient to continue to be followed by acute OT.    Recommendations for follow up therapy are one component of a multi-disciplinary discharge planning process, led by the attending physician.  Recommendations may be updated based on patient status, additional functional criteria and insurance authorization.    Follow Up Recommendations  Home health OT    Assistance Recommended at Discharge Intermittent Supervision/Assistance  Patient can return home with the following  A little help with bathing/dressing/bathroom;Assistance with cooking/housework;Assist for transportation   Equipment Recommendations  BSC/3in1    Recommendations for Other Services      Precautions / Restrictions Precautions Precautions: None Required Braces or Orthoses: Other Brace Other Brace: scrotal sling for mobility Restrictions Weight Bearing Restrictions: Yes RLE Weight Bearing: Weight bearing as tolerated       Mobility Bed Mobility Overal bed mobility: Needs Assistance Bed Mobility: Supine to Sit     Supine to sit: Supervision, HOB elevated     General bed mobility comments: used gait belt as a leg lifter to assist with RLE    Transfers Overall transfer level: Needs assistance Equipment used: Rolling walker (2 wheels) Transfers: Sit  to/from Stand Sit to Stand: Min guard           General transfer comment: ambulated from EOB to sink and back to recliner with min guard assist     Balance Overall balance assessment: Needs assistance Sitting-balance support: Feet supported, No upper extremity supported Sitting balance-Leahy Scale: Good Sitting balance - Comments: able to keep balance on EOB without assistance   Standing balance support: Single extremity supported, Bilateral upper extremity supported, During functional activity Standing balance-Leahy Scale: Fair Standing balance comment: able to stand at sink for hand and face hygiene                           ADL either performed or assessed with clinical judgement   ADL Overall ADL's : Needs assistance/impaired     Grooming: Wash/dry hands;Wash/dry face;Standing;Min guard Grooming Details (indicate cue type and reason): performed standing at sink                 Toilet Transfer: Min guard;Rolling walker (2 wheels) Toilet Transfer Details (indicate cue type and reason): simulated to recliner                Extremity/Trunk Assessment              Vision       Perception     Praxis      Cognition Arousal/Alertness: Awake/alert Behavior During Therapy: WFL for tasks assessed/performed Overall Cognitive Status: Within Functional Limits for tasks assessed  General Comments: motivated to get out of bed        Exercises      Shoulder Instructions       General Comments      Pertinent Vitals/ Pain       Pain Assessment Pain Assessment: Faces Faces Pain Scale: Hurts little more Pain Location: RLE Pain Descriptors / Indicators: Discomfort, Grimacing Pain Intervention(s): Monitored during session, Repositioned  Home Living                                          Prior Functioning/Environment              Frequency  Min 2X/week         Progress Toward Goals  OT Goals(current goals can now be found in the care plan section)  Progress towards OT goals: Progressing toward goals  Acute Rehab OT Goals Patient Stated Goal: go home OT Goal Formulation: With patient Time For Goal Achievement: 10/15/21 Potential to Achieve Goals: Good ADL Goals Pt Will Perform Lower Body Bathing: with modified independence;with adaptive equipment;sit to/from stand Pt Will Perform Lower Body Dressing: with modified independence;with adaptive equipment;sit to/from stand Pt Will Transfer to Toilet: with modified independence;ambulating Pt Will Perform Toileting - Clothing Manipulation and hygiene: with modified independence;sit to/from stand Additional ADL Goal #1: Pt will independently manage scrotal sling to reduce pain and edema and increase independence with ADL/mobility  Plan Discharge plan needs to be updated    Co-evaluation                 AM-PAC OT "6 Clicks" Daily Activity     Outcome Measure   Help from another person eating meals?: None Help from another person taking care of personal grooming?: None Help from another person toileting, which includes using toliet, bedpan, or urinal?: A Lot Help from another person bathing (including washing, rinsing, drying)?: A Lot Help from another person to put on and taking off regular upper body clothing?: A Little Help from another person to put on and taking off regular lower body clothing?: A Lot 6 Click Score: 17    End of Session Equipment Utilized During Treatment: Rolling walker (2 wheels)  OT Visit Diagnosis: Unsteadiness on feet (R26.81);Other abnormalities of gait and mobility (R26.89);Muscle weakness (generalized) (M62.81);Pain Pain - Right/Left: Right Pain - part of body: Leg   Activity Tolerance Patient tolerated treatment well   Patient Left in chair;with call bell/phone within reach   Nurse Communication Mobility status        Time: 2197-5883 OT Time  Calculation (min): 23 min  Charges: OT General Charges $OT Visit: 1 Visit OT Treatments $Self Care/Home Management : 8-22 mins $Therapeutic Activity: 8-22 mins  Lodema Hong, [place]  Office [phone]   Trixie Dredge 10/03/2021, 12:20 PM

## 2021-10-03 NOTE — Progress Notes (Addendum)
  Progress Note    10/03/2021 7:45 AM 18 Days Post-Op  Subjective:  sleeping initially when I entered room. No complaints   Vitals:   10/02/21 2317 10/03/21 0346  BP: (!) 88/56 126/62  Pulse: 77 69  Resp: 20 16  Temp: 98.4 F (36.9 C) 98.1 F (36.7 C)  SpO2: 99% 98%   Physical Exam: Cardiac:  regular Lungs:  non labored Incisions:  Right groin/ RIght thigh dressings c/d/i Extremities:  well perfused and warm with Doppler PT/DP Neurologic: alert and oriented  CBC    Component Value Date/Time   WBC 5.8 10/03/2021 0115   RBC 3.14 (L) 10/03/2021 0115   HGB 9.5 (L) 10/03/2021 0115   HCT 30.9 (L) 10/03/2021 0115   PLT 273 10/03/2021 0115   MCV 98.4 10/03/2021 0115   MCH 30.3 10/03/2021 0115   MCHC 30.7 10/03/2021 0115   RDW 17.4 (H) 10/03/2021 0115   LYMPHSABS 818 (L) 03/08/2021 1117   MONOABS 0.8 05/11/2019 1352   EOSABS 112 03/08/2021 1117   BASOSABS 33 03/08/2021 1117    BMET    Component Value Date/Time   NA 137 09/27/2021 0228   K 4.0 09/27/2021 0228   CL 103 09/27/2021 0228   CO2 28 09/27/2021 0228   GLUCOSE 114 (H) 09/27/2021 0228   BUN 16 09/27/2021 0228   CREATININE 0.97 09/27/2021 0228   CREATININE 0.90 03/08/2021 1117   CALCIUM 8.1 (L) 09/27/2021 0228   GFRNONAA >60 09/27/2021 0228   GFRNONAA 89 11/17/2019 1258   GFRAA 104 11/17/2019 1258    INR    Component Value Date/Time   INR 1.7 (H) 10/03/2021 0115     Intake/Output Summary (Last 24 hours) at 10/03/2021 0745 Last data filed at 10/02/2021 1826 Gross per 24 hour  Intake 240 ml  Output 800 ml  Net -560 ml     Assessment/Plan:  66 y.o. male is s/p femoral to AK pop bypass with vein; subsequent take back for bleeding  18 Days Post-Op   RLE well perfused and warm with 2+ doppler PT/DP Continue BID dry dressing changes Continue to mobilize as tolerated Anticipate possible d/c tomorrow or Friday if INR closer to 2   DVT prophylaxis:  Coumadin   Karoline Caldwell, PA-C [facility] [phone] 10/03/2021 7:45 AM  I have independently interviewed and examined patient and agree with PA assessment and plan above.   Filomeno Cromley C. Donzetta Matters, MD [facility] [place] Office: [phone] Pager: [phone]

## 2021-10-03 NOTE — Progress Notes (Signed)
In for BID dressing change to R groin and RLE, R groin wound oozing quite a bit, gaping with odor.  Pt c/o significant tenderness at staple sites.  On call vascular MD, Brabham paged and notified.  He will be by to assess, prior to redressing.

## 2021-10-03 NOTE — Progress Notes (Signed)
I was asked to come evaluate the patient's incision during a dressing change.  There is a new opening in the incision.  I packed this with a 4 x 4.   Annamarie Major

## 2021-10-03 NOTE — Progress Notes (Signed)
Mobility Specialist: Progress Note   10/03/21 1435  Mobility  Activity Ambulated with assistance in hallway  Level of Assistance Standby assist, set-up cues, supervision of patient - no hands on  Assistive Device Front wheel walker  RLE Weight Bearing WBAT  Distance Ambulated (ft) 260 ft  Activity Response Tolerated well  $Mobility charge 1 Mobility   Post-Mobility: 79 HR  Pt received in the bed and agreeable to mobility. C/o general fatigue, otherwise no c/o. Pt back to bed per request with call bell at his side.   [facility] Eloise Mula Mobility Specialist Mobility Specialist [address]: [phone]

## 2021-10-03 NOTE — TOC Progression Note (Signed)
Transition of Care [facility]) - Progression Note    Patient Details  Name: [name] MRN: [id number] Date of Birth: Oct 06, 1955  Transition [facility] CM/SW Contact  Cyndi Bender, RN Phone Number: 10/03/2021, 2:13 PM  Clinical Narrative:    Spoke to patient regarding transition needs. Patient states he lives with his spouse and has a walker and cane. Orders for home health. Choice offered and patient defers to [facility] to find highly rated agency. Anderson Malta with [facility] accepted referral.  Spouse to learn to do the dressing change today. Spouse to transport patient home once stable.  Address, Phone number and PCP verified.  TOC will continue to follow for needs.  Spoke to [place] with [facility] and referral accepted for RN/PT/OT.  Anderson Malta with [facility] notified.    Expected Discharge Plan: [place] Barriers to Discharge: Continued Medical Work up  Expected Discharge Plan and Services Expected Discharge Plan: [place]   Discharge Planning Services: CM Consult Post Acute Care Choice: [place] arrangements for the past 2 months: Single Family Home                           HH Arranged: RN, OT [facility] Agency: [facility] Date [place]: 10/03/21 Time [place]: 3614 Representative spoke with at [place]: [place] ([place]) Interventions    Readmission Risk Interventions     No data to display

## 2021-10-03 NOTE — Care Management Important Message (Signed)
Important Message  Patient Details  Name: [name] MRN: [id number] Date of Birth: 03-Feb-1956   Medicare Important Message Given:  Yes     Shelda Altes 10/03/2021, 8:00 AM

## 2021-10-04 LAB — BASIC METABOLIC PANEL
Anion gap: 5 (ref 5–15)
BUN: 16 mg/dL (ref 8–23)
CO2: 27 mmol/L (ref 22–32)
Calcium: 8.4 mg/dL — ABNORMAL LOW (ref 8.9–10.3)
Chloride: 104 mmol/L (ref 98–111)
Creatinine, Ser: 0.99 mg/dL (ref 0.61–1.24)
GFR, Estimated: >60 mL/min (ref >60)
Glucose, Bld: 132 mg/dL — ABNORMAL HIGH (ref 70–99)
Potassium: 4.3 mmol/L (ref 3.5–5.1)
Sodium: 136 mmol/L (ref 135–145)

## 2021-10-04 LAB — CBC
HCT: 30.9 % — ABNORMAL LOW (ref 39.0–52.0)
Hemoglobin: 9.2 g/dL — ABNORMAL LOW (ref 13.0–17.0)
MCH: 29.8 pg (ref 26.0–34.0)
MCHC: 29.8 g/dL — ABNORMAL LOW (ref 30.0–36.0)
MCV: 100 fL (ref 80.0–100.0)
Platelets: 262 10*3/uL (ref 150–400)
RBC: 3.09 MIL/uL — ABNORMAL LOW (ref 4.22–5.81)
RDW: 17.3 % — ABNORMAL HIGH (ref 11.5–15.5)
WBC: 6.8 10*3/uL (ref 4.0–10.5)
nRBC: 0 % (ref 0.0–0.2)

## 2021-10-04 LAB — PROTIME-INR
INR: 2 — ABNORMAL HIGH (ref 0.8–1.2)
Prothrombin Time: 22.3 seconds — ABNORMAL HIGH (ref 11.4–15.2)

## 2021-10-04 LAB — HEPARIN LEVEL (UNFRACTIONATED): Heparin Unfractionated: 0.49 IU/mL (ref 0.30–0.70)

## 2021-10-04 MED ORDER — CEFAZOLIN SODIUM-DEXTROSE 2-4 GM/100ML-% IV SOLN
2.0000 g | INTRAVENOUS | Status: AC
  Administered 2021-10-05: 2 g via INTRAVENOUS
  Filled 2021-10-04: qty 100

## 2021-10-04 MED ORDER — WARFARIN SODIUM 7.5 MG PO TABS
7.5000 mg | ORAL_TABLET | Freq: Once | ORAL | Status: AC
Start: 2021-10-04 — End: 2021-10-04
  Administered 2021-10-04: 7.5 mg via ORAL
  Filled 2021-10-04: qty 1

## 2021-10-04 NOTE — Progress Notes (Signed)
PT Cancellation Note  Patient Details Name: [name] MRN: [id number] DOB: 1955-12-01   Cancelled Treatment:    Reason Eval/Treat Not Completed: Other (comment), pt with opening of groin wound, declining mobility at this time, plan for OR tomorrow. Will check back in PM as schedule allows to continue with PT POC.  Audry Riles. PTA Acute Rehabilitation Services Office: [place] 10/04/2021, 11:17 AM

## 2021-10-04 NOTE — Progress Notes (Signed)
  Progress Note    10/04/2021 8:56 AM 19 Days Post-Op  Subjective: Patient noted to have wound opening in right groin yesterday packed with wet-to-dry dressing  Vitals:   10/04/21 0344 10/04/21 0836  BP: 128/64 (!) 123/51  Pulse: 71 72  Resp: 12 16  Temp: 97.7 F (36.5 C) 98.2 F (36.8 C)  SpO2: 98% 97%    Physical Exam: Awake alert and oriented Nonlabored respiration Right groin packing removed and there is some hematoma there that cannot fully be expressed and packing was replaced after multiple staples removed Right foot is warm and well-perfused and rest of incisions are intact in the right lower extremity above the knee  CBC    Component Value Date/Time   WBC 6.8 10/04/2021 0112   RBC 3.09 (L) 10/04/2021 0112   HGB 9.2 (L) 10/04/2021 0112   HCT 30.9 (L) 10/04/2021 0112   PLT 262 10/04/2021 0112   MCV 100.0 10/04/2021 0112   MCH 29.8 10/04/2021 0112   MCHC 29.8 (L) 10/04/2021 0112   RDW 17.3 (H) 10/04/2021 0112   LYMPHSABS 818 (L) 03/08/2021 1117   MONOABS 0.8 05/11/2019 1352   EOSABS 112 03/08/2021 1117   BASOSABS 33 03/08/2021 1117    BMET    Component Value Date/Time   NA 136 10/04/2021 0112   K 4.3 10/04/2021 0112   CL 104 10/04/2021 0112   CO2 27 10/04/2021 0112   GLUCOSE 132 (H) 10/04/2021 0112   BUN 16 10/04/2021 0112   CREATININE 0.99 10/04/2021 0112   CREATININE 0.90 03/08/2021 1117   CALCIUM 8.4 (L) 10/04/2021 0112   GFRNONAA >60 10/04/2021 0112   GFRNONAA 89 11/17/2019 1258   GFRAA 104 11/17/2019 1258    INR    Component Value Date/Time   INR 2.0 (H) 10/04/2021 0112     Intake/Output Summary (Last 24 hours) at 10/04/2021 0856 Last data filed at 10/04/2021 0348 Gross per 24 hour  Intake 914.5 ml  Output 1550 ml  Net -635.5 ml     Assessment/plan:  66 y.o. male is right femoropopliteal bypass with vein complicated by hematoma requiring takeback to the OR for washout.  His INR is now 2.  We will continue heparin and Coumadin until  his INR is goal.  I am planning to take him back to the operating room tomorrow to washout his groin to prevent infection of his hematoma that is now draining.     Tensley Wery C. Donzetta Matters, MD [facility] [place] Office: [phone] Pager: [phone]  10/04/2021 8:56 AM

## 2021-10-04 NOTE — Progress Notes (Signed)
ANTICOAGULATION CONSULT NOTE  Pharmacy Consult for Heparin and Warfarin Indication: atrial fibrillation and mechanical MVR, PAD  Patient Measurements: Height: '5\' 8"'$  (172.7 cm) Weight: 88.5 kg (195 lb) IBW/kg (Calculated) : 68.4  Heparin Dosing Weight: 86 kg  Vital Signs: Temp: 98.7 F (37.1 C) (06/24 0728) Temp Source: Oral (06/24 0728) BP: 119/46 (06/24 0728) Pulse Rate: 76 (06/24 0728)  Labs: Recent Labs    10/01/21 0336 10/02/21 0028 10/03/21 0115  HGB 8.6* 9.1* 9.5*  HCT 29.4* 30.0* 30.9*  PLT 260 244 273  LABPROT 18.1* 18.2* 20.1*  INR 1.5* 1.5* 1.7*  HEPARINUNFRC 0.43 0.39 0.59    Estimated Creatinine Clearance: 82 mL/min (by C-G formula based on SCr of 0.97 mg/dL).   Assessment: 66 y.o. male on warfarin PTA for mechanical MVR/AVR, hx afib s/p R femoral to AK popliteal bypass with vein and subsequent hematoma evacuation 6/17. Pharmacy consulted to resume heparin slowly on 6/23 per [facility]VS and resume PTA warfarin.   PTA warfarin regimen (per 6/08 anticoagulation note): 7 mg Mon/Wed/Fri 6.5 mg all other days    Previous pharmacist spoke with patient and confirmed he has some supply of enoxaparin for bridge at home and is familiar with using it in the event that patient is discharged before INR therapeutic.  Heparin level of 0.49 is therapeutic on heparin 1400 units/hr. INR 2.0 - trending up. Hgb 9.2. Plts wnl.   Goal of Therapy:  Heparin level 0.3-0.7 units/ml  INR 2.5-3.5 Monitor platelets by anticoagulation protocol: Yes   Plan:  Continue heparin infusion at 1400 units/hr  Warfarin 7.5 mg tonight  Monitor CBC, s/sx of bleeding, heparin level, and INR daily   Cristela Felt, PharmD, BCPS Clinical Pharmacist 10/04/2021 7:11 AM

## 2021-10-05 ENCOUNTER — Inpatient Hospital Stay (HOSPITAL_COMMUNITY): Payer: Medicare Other | Admitting: Anesthesiology

## 2021-10-05 ENCOUNTER — Encounter (HOSPITAL_COMMUNITY)
Admission: RE | Disposition: A | Discharge status: Home / Self Care | Payer: Self-pay | Source: Ambulatory Visit | Attending: Vascular Surgery

## 2021-10-05 ENCOUNTER — Other Ambulatory Visit: Payer: Self-pay

## 2021-10-05 ENCOUNTER — Encounter (HOSPITAL_COMMUNITY): Payer: Self-pay | Admitting: Vascular Surgery

## 2021-10-05 DIAGNOSIS — I10 Essential (primary) hypertension: Secondary | ICD-10-CM

## 2021-10-05 DIAGNOSIS — I97638 Postprocedural hematoma of a circulatory system organ or structure following other circulatory system procedure: Secondary | ICD-10-CM

## 2021-10-05 DIAGNOSIS — I2510 Atherosclerotic heart disease of native coronary artery without angina pectoris: Secondary | ICD-10-CM

## 2021-10-05 DIAGNOSIS — I252 Old myocardial infarction: Secondary | ICD-10-CM

## 2021-10-05 HISTORY — PX: INCISION AND DRAINAGE OF WOUND: SHX1803

## 2021-10-05 HISTORY — PX: APPLICATION OF WOUND VAC: SHX5189

## 2021-10-05 LAB — CBC
HCT: 33.6 % — ABNORMAL LOW (ref 39.0–52.0)
Hemoglobin: 9.9 g/dL — ABNORMAL LOW (ref 13.0–17.0)
MCH: 29.4 pg (ref 26.0–34.0)
MCHC: 29.5 g/dL — ABNORMAL LOW (ref 30.0–36.0)
MCV: 99.7 fL (ref 80.0–100.0)
Platelets: 266 10*3/uL (ref 150–400)
RBC: 3.37 MIL/uL — ABNORMAL LOW (ref 4.22–5.81)
RDW: 17.2 % — ABNORMAL HIGH (ref 11.5–15.5)
WBC: 5.2 10*3/uL (ref 4.0–10.5)
nRBC: 0 % (ref 0.0–0.2)

## 2021-10-05 LAB — PROTIME-INR
INR: 2.3 — ABNORMAL HIGH (ref 0.8–1.2)
Prothrombin Time: 25 seconds — ABNORMAL HIGH (ref 11.4–15.2)

## 2021-10-05 LAB — HEPARIN LEVEL (UNFRACTIONATED): Heparin Unfractionated: 0.66 IU/mL (ref 0.30–0.70)

## 2021-10-05 SURGERY — IRRIGATION AND DEBRIDEMENT WOUND
Anesthesia: General | Site: Leg Upper | Laterality: Right

## 2021-10-05 MED ORDER — MIDAZOLAM HCL 5 MG/5ML IJ SOLN
INTRAMUSCULAR | Status: DC | PRN
  Administered 2021-10-05: 2 mg via INTRAVENOUS

## 2021-10-05 MED ORDER — PROPOFOL 10 MG/ML IV BOLUS
INTRAVENOUS | Status: DC | PRN
  Administered 2021-10-05: 130 mg via INTRAVENOUS

## 2021-10-05 MED ORDER — CHLORHEXIDINE GLUCONATE 0.12 % MT SOLN
OROMUCOSAL | Status: AC
  Filled 2021-10-05: qty 15

## 2021-10-05 MED ORDER — WARFARIN SODIUM 5 MG PO TABS
7.0000 mg | ORAL_TABLET | Freq: Once | ORAL | Status: AC
  Administered 2021-10-05: 7 mg via ORAL
  Filled 2021-10-05: qty 1

## 2021-10-05 MED ORDER — PROPOFOL 10 MG/ML IV BOLUS
INTRAVENOUS | Status: AC
  Filled 2021-10-05: qty 20

## 2021-10-05 MED ORDER — WARFARIN SODIUM 5 MG PO TABS: 7.0000 mg | ORAL_TABLET | Freq: Once | ORAL | Status: DC

## 2021-10-05 MED ORDER — FENTANYL CITRATE (PF) 100 MCG/2ML IJ SOLN: 25.0000 ug | INTRAMUSCULAR | Status: DC | PRN

## 2021-10-05 MED ORDER — MIDAZOLAM HCL 2 MG/2ML IJ SOLN
INTRAMUSCULAR | Status: AC
  Filled 2021-10-05: qty 2

## 2021-10-05 MED ORDER — ONDANSETRON HCL 4 MG/2ML IJ SOLN: 4.0000 mg | Freq: Once | INTRAMUSCULAR | Status: DC | PRN

## 2021-10-05 MED ORDER — OXYCODONE HCL 5 MG PO TABS: 5.0000 mg | ORAL_TABLET | Freq: Once | ORAL | Status: DC | PRN

## 2021-10-05 MED ORDER — VANCOMYCIN HCL 1750 MG/350ML IV SOLN
1750.0000 mg | INTRAVENOUS | Status: AC
  Administered 2021-10-05: 1750 mg via INTRAVENOUS
  Filled 2021-10-05: qty 350

## 2021-10-05 MED ORDER — FENTANYL CITRATE (PF) 250 MCG/5ML IJ SOLN
INTRAMUSCULAR | Status: AC
  Filled 2021-10-05: qty 5

## 2021-10-05 MED ORDER — SODIUM CHLORIDE 0.9 % IR SOLN
Status: DC | PRN
  Administered 2021-10-05: 3000 mL

## 2021-10-05 MED ORDER — OXYCODONE HCL 5 MG/5ML PO SOLN: 5.0000 mg | Freq: Once | ORAL | Status: DC | PRN

## 2021-10-05 MED ORDER — PHENYLEPHRINE 80 MCG/ML (10ML) SYRINGE FOR IV PUSH (FOR BLOOD PRESSURE SUPPORT)
PREFILLED_SYRINGE | INTRAVENOUS | Status: DC | PRN
  Administered 2021-10-05: 80 ug via INTRAVENOUS
  Administered 2021-10-05: 160 ug via INTRAVENOUS
  Administered 2021-10-05: 80 ug via INTRAVENOUS

## 2021-10-05 MED ORDER — PHENYLEPHRINE HCL-NACL 20-0.9 MG/250ML-% IV SOLN
INTRAVENOUS | Status: DC | PRN
  Administered 2021-10-05: 10 ug/min via INTRAVENOUS

## 2021-10-05 MED ORDER — PIPERACILLIN-TAZOBACTAM 3.375 G IVPB
3.3750 g | Freq: Three times a day (TID) | INTRAVENOUS | Status: DC
  Administered 2021-10-05 – 2021-10-08 (×8): 3.375 g via INTRAVENOUS
  Filled 2021-10-05 (×8): qty 50

## 2021-10-05 MED ORDER — LIDOCAINE 2% (20 MG/ML) 5 ML SYRINGE
INTRAMUSCULAR | Status: DC | PRN
  Administered 2021-10-05: 60 mg via INTRAVENOUS

## 2021-10-05 MED ORDER — FENTANYL CITRATE (PF) 250 MCG/5ML IJ SOLN
INTRAMUSCULAR | Status: DC | PRN
  Administered 2021-10-05 (×2): 50 ug via INTRAVENOUS
  Administered 2021-10-05: 100 ug via INTRAVENOUS

## 2021-10-05 MED ORDER — VANCOMYCIN HCL IN DEXTROSE 1-5 GM/200ML-% IV SOLN
1000.0000 mg | Freq: Two times a day (BID) | INTRAVENOUS | Status: DC
  Administered 2021-10-06 – 2021-10-08 (×5): 1000 mg via INTRAVENOUS
  Filled 2021-10-05 (×6): qty 200

## 2021-10-05 MED ORDER — PIPERACILLIN-TAZOBACTAM 3.375 G IVPB 30 MIN
3.3750 g | INTRAVENOUS | Status: AC
  Administered 2021-10-05: 3.375 g via INTRAVENOUS
  Filled 2021-10-05: qty 50

## 2021-10-05 MED ORDER — ONDANSETRON HCL 4 MG/2ML IJ SOLN
INTRAMUSCULAR | Status: DC | PRN
  Administered 2021-10-05: 4 mg via INTRAVENOUS

## 2021-10-05 MED ORDER — 0.9 % SODIUM CHLORIDE (POUR BTL) OPTIME
TOPICAL | Status: DC | PRN
  Administered 2021-10-05: 1000 mL

## 2021-10-05 SURGICAL SUPPLY — 44 items
BAG COUNTER SPONGE SURGICOUNT (BAG) ×3 IMPLANT
BNDG ELASTIC 4X5.8 VLCR STR LF (GAUZE/BANDAGES/DRESSINGS) IMPLANT
BNDG ELASTIC 6X5.8 VLCR STR LF (GAUZE/BANDAGES/DRESSINGS) IMPLANT
BNDG GAUZE ELAST 4 BULKY (GAUZE/BANDAGES/DRESSINGS) IMPLANT
CANISTER SUCT 3000ML PPV (MISCELLANEOUS) ×3 IMPLANT
CANISTER WOUND CARE 500ML ATS (WOUND CARE) ×1 IMPLANT
CLIP LIGATING EXTRA MED SLVR (CLIP) ×3 IMPLANT
CLIP LIGATING EXTRA SM BLUE (MISCELLANEOUS) ×3 IMPLANT
CONNECTOR Y ATS VAC SYSTEM (MISCELLANEOUS) ×1 IMPLANT
COVER SURGICAL LIGHT HANDLE (MISCELLANEOUS) ×3 IMPLANT
DRAPE HALF SHEET 40X57 (DRAPES) IMPLANT
DRAPE U-SHAPE 76X120 STRL (DRAPES) IMPLANT
DRSG MEPILEX BORDER 4X8 (GAUZE/BANDAGES/DRESSINGS) ×1 IMPLANT
DRSG VAC ATS MED SENSATRAC (GAUZE/BANDAGES/DRESSINGS) ×1 IMPLANT
DRSG VAC ATS SM SENSATRAC (GAUZE/BANDAGES/DRESSINGS) ×1 IMPLANT
DRSG VERSA FOAM LRG 10X15 (GAUZE/BANDAGES/DRESSINGS) ×1 IMPLANT
ELECT REM PT RETURN 9FT ADLT (ELECTROSURGICAL) ×3
ELECTRODE REM PT RTRN 9FT ADLT (ELECTROSURGICAL) ×2 IMPLANT
GAUZE SPONGE 4X4 12PLY STRL (GAUZE/BANDAGES/DRESSINGS) ×3 IMPLANT
GAUZE XEROFORM 5X9 LF (GAUZE/BANDAGES/DRESSINGS) IMPLANT
GLOVE BIO SURGEON STRL SZ7.5 (GLOVE) ×3 IMPLANT
GOWN STRL REUS W/ TWL LRG LVL3 (GOWN DISPOSABLE) ×4 IMPLANT
GOWN STRL REUS W/ TWL XL LVL3 (GOWN DISPOSABLE) ×2 IMPLANT
GOWN STRL REUS W/TWL LRG LVL3 (GOWN DISPOSABLE) ×2
GOWN STRL REUS W/TWL XL LVL3 (GOWN DISPOSABLE) ×1
HANDPIECE INTERPULSE COAX TIP (DISPOSABLE) ×1
IV NS IRRIG 3000ML ARTHROMATIC (IV SOLUTION) ×3 IMPLANT
KIT BASIN OR (CUSTOM PROCEDURE TRAY) ×3 IMPLANT
KIT TURNOVER KIT B (KITS) ×3 IMPLANT
NS IRRIG 1000ML POUR BTL (IV SOLUTION) ×3 IMPLANT
PACK CV ACCESS (CUSTOM PROCEDURE TRAY) IMPLANT
PACK GENERAL/GYN (CUSTOM PROCEDURE TRAY) ×3 IMPLANT
PACK UNIVERSAL I (CUSTOM PROCEDURE TRAY) ×3 IMPLANT
PAD ARMBOARD 7.5X6 YLW CONV (MISCELLANEOUS) ×6 IMPLANT
PULSAVAC PLUS IRRIG FAN TIP (DISPOSABLE)
SET HNDPC FAN SPRY TIP SCT (DISPOSABLE) IMPLANT
SUT ETHILON 3 0 PS 1 (SUTURE) IMPLANT
SUT VIC AB 2-0 CTX 36 (SUTURE) IMPLANT
SUT VIC AB 3-0 SH 27 (SUTURE)
SUT VIC AB 3-0 SH 27X BRD (SUTURE) IMPLANT
SUT VICRYL 4-0 PS2 18IN ABS (SUTURE) IMPLANT
TIP FAN IRRIG PULSAVAC PLUS (DISPOSABLE) IMPLANT
TOWEL GREEN STERILE (TOWEL DISPOSABLE) ×3 IMPLANT
WATER STERILE IRR 1000ML POUR (IV SOLUTION) ×3 IMPLANT

## 2021-10-05 NOTE — Op Note (Signed)
    Patient name: [name] MRN: [id number] DOB: Jul 04, 1955 Sex: male  10/05/2021 Pre-operative Diagnosis: Right groin and thigh hematoma Post-operative diagnosis:  Same Surgeon:  Erlene Quan C. Donzetta Matters, MD Assistant: Laurence Slate, PA Procedure Performed:  Washout of right groin and right thigh wounds and placement of negative pressure dressing  Indications: 66 year old male status post right femoral to popliteal artery bypass with vein complicated by hematoma requiring takeback and exploration of right thigh.  He now has drainage from the right groin with significant hematoma and is indicated for washout and wound VAC placement.  Findings: The right groin had significant foul-smelling hematoma that was washed out and a wound VAC was placed.  There is also 1 area in the mid thigh that had draining hematoma which was also washed out and wound VAC placed there as well.  The bypass graft was deep within the wound bed appeared to be well protected although was palpable and a white sponge was used over top of this and black sponge above that.   Procedure:  The patient was identified in the holding area and taken to the operating room where LMA anesthesia was induced.  He was sterilely prepped draped in the right lower extremity in the usual fashion, antibiotics were administered and a timeout was called.  Cultures were sent of the right groin drainage.  I began by washing out the groin wound but then opened up the wound from the opening back to the proximal most aspect of the incision.  I then thoroughly irrigated the wound until I removed significant hematoma.  I then began to remove sutures from the wound in the thigh leaving the staples behind there was 1 area that there was also draining hematoma and 3 staples removed from this and it was opened and ultimately a total of 6 tables were removed and I thoroughly irrigated this as well.  A total of 3 L of fluid was used for washout.  Ultimately the wounds were  all clean.  There was a palpable pulse in the groin wound deep I could not visualize the bypass but elected to place a wound VAC with a white sponge overlying that and then placed a black sponge above that.  A black sponge was placed in the thigh wound vacs were fashioned with a white connection and placed to 125 mmHg suction.  He was then awakened from anesthesia having tolerated procedure well without immediate complication.  All counts were correct at completion.  EBL: 20 cc    Shalandra Leu C. Donzetta Matters, MD [facility] [place] Office: [phone] Pager: [phone]

## 2021-10-05 NOTE — Progress Notes (Signed)
Mobility Specialist Progress Note    10/05/21 1322  Mobility  Activity Contraindicated/medical hold   Pt going down for washout. Will f/u as appropriate.   Hildred Alamin Mobility Specialist

## 2021-10-05 NOTE — Transfer of Care (Signed)
Immediate Anesthesia Transfer of Care Note  Patient: [name]  Procedure(s) Performed: RIGHT GROIN WASHOUT (Right) APPLICATION OF RIGHT LEG WOUND VAC (Leg Upper)  Patient Location: PACU  Anesthesia Type:General  Level of Consciousness: drowsy and patient cooperative  Airway & Oxygen Therapy: Patient Spontanous Breathing  Post-op Assessment: Report given to RN and Post -op Vital signs reviewed and stable  Post vital signs: Reviewed and stable  Last Vitals:  Vitals Value Taken Time  BP 115/49 10/05/21 1522  Temp    Pulse 70 10/05/21 1525  Resp 16 10/05/21 1525  SpO2 89 % 10/05/21 1525  Vitals shown include unvalidated device data.  Last Pain:  Vitals:   10/05/21 1349  TempSrc:   PainSc: 5       Patients Stated Pain Goal: 3 (35/32/99 2426)  Complications: No notable events documented.

## 2021-10-05 NOTE — Progress Notes (Signed)
Per PACU RN Richardson Landry- Dr. Donzetta Matters does not want heparin gtt restarted. Heparin stopped while in procedure. This RN is not sure of time stopped. Pt returned to floor at 1655. Called pharmacy to reorder PM coumadin dose. Payton Emerald, RN

## 2021-10-05 NOTE — Progress Notes (Signed)
ANTICOAGULATION CONSULT NOTE  Pharmacy Consult for Heparin and Warfarin Indication: atrial fibrillation and mechanical MVR, PAD  Patient Measurements: Height: '5\' 8"'$  (172.7 cm) Weight: 88.5 kg (195 lb) IBW/kg (Calculated) : 68.4  Heparin Dosing Weight: 86 kg  Vital Signs: Temp: 98.7 F (37.1 C) (06/24 0728) Temp Source: Oral (06/24 0728) BP: 119/46 (06/24 0728) Pulse Rate: 76 (06/24 0728)  Labs: Recent Labs    10/01/21 0336 10/02/21 0028 10/03/21 0115  HGB 8.6* 9.1* 9.5*  HCT 29.4* 30.0* 30.9*  PLT 260 244 273  LABPROT 18.1* 18.2* 20.1*  INR 1.5* 1.5* 1.7*  HEPARINUNFRC 0.43 0.39 0.59    Estimated Creatinine Clearance: 82 mL/min (by C-G formula based on SCr of 0.97 mg/dL).   Assessment: 66 y.o. male on warfarin PTA for mechanical MVR/AVR, hx afib s/p R femoral to AK popliteal bypass with vein and subsequent hematoma evacuation 6/17. Pharmacy consulted to resume heparin slowly on 6/23 per [facility]VS and resume PTA warfarin.   PTA warfarin regimen (per 6/08 anticoagulation note): 7 mg Mon/Wed/Fri 6.5 mg all other days    Previous pharmacist spoke with patient and confirmed he has some supply of enoxaparin for bridge at home and is familiar with using it in the event that patient is discharged before INR therapeutic.  Heparin level of 0.66 is therapeutic on heparin 1400 units/hr. INR 2.3 - trending up. Hgb 9.9 trending up. Plts wnl. Will slightly decrease heparin rate to target middle of goal range.   Goal of Therapy:  Heparin level 0.3-0.7 units/ml  INR 2.5-3.5 Monitor platelets by anticoagulation protocol: Yes   Plan:  Decrease heparin infusion to 1350 units/hr  Warfarin 7 mg tonight  Monitor CBC, s/sx of bleeding, heparin level, and INR daily   Eliseo Gum, PharmD PGY1 Pharmacy Resident   10/05/2021  8:19 AM

## 2021-10-05 NOTE — Anesthesia Procedure Notes (Signed)
Procedure Name: LMA Insertion Date/Time: 10/05/2021 2:21 PM  Performed by: Vonna Drafts, CRNAPre-anesthesia Checklist: Patient identified, Emergency Drugs available, Suction available, Patient being monitored and Timeout performed Patient Re-evaluated:Patient Re-evaluated prior to induction Oxygen Delivery Method: Circle system utilized Preoxygenation: Pre-oxygenation with 100% oxygen Induction Type: IV induction Ventilation: Mask ventilation without difficulty LMA: LMA inserted LMA Size: 4.0 Number of attempts: 1 Tube secured with: Tape Dental Injury: Teeth and Oropharynx as per pre-operative assessment

## 2021-10-05 NOTE — Anesthesia Preprocedure Evaluation (Addendum)
Anesthesia Evaluation  Patient identified by MRN, date of birth, ID band Patient awake    Reviewed: Allergy & Precautions, NPO status , Patient's Chart, lab work & pertinent test results  History of Anesthesia Complications Negative for: history of anesthetic complications  Airway Mallampati: II  TM Distance: >3 FB Neck ROM: Full    Dental  (+) Dental Advisory Given   Pulmonary shortness of breath, COPD, Current Smoker and Patient abstained from smoking.,    Pulmonary exam normal        Cardiovascular hypertension, Pt. on medications (-) angina+ CAD, + Past MI, + Cardiac Stents and + Peripheral Vascular Disease  Normal cardiovascular exam+ dysrhythmias + Valvular Problems/Murmurs    '23 TTE - EF 60 to 65%. Left atrial size was moderately dilated. Right atrial size was mildly dilated. The mitral valve has been repaired/replaced. Echo findings are consistent with normal structure and function of the mitral valve prosthesis. The aortic valve has been repaired/replaced. Aortic valve regurgitation is trivial.    Neuro/Psych CVA    GI/Hepatic Neg liver ROS, hiatal hernia,   Endo/Other  negative endocrine ROS  Renal/GU Lab Results      Component                Value               Date                      CREATININE               1.12                09/15/2021                Musculoskeletal negative musculoskeletal ROS (+)   Abdominal   Peds  Hematology  (+) Blood dyscrasia, anemia ,  On coumadin    Anesthesia Other Findings   Reproductive/Obstetrics                            Anesthesia Physical  Anesthesia Plan  ASA: 4  Anesthesia Plan: General   Post-op Pain Management: Tylenol PO (pre-op)*   Induction: Intravenous  PONV Risk Score and Plan: 1 and Ondansetron, Dexamethasone and Treatment may vary due to age or medical condition  Airway Management Planned: LMA  Additional  Equipment: None  Intra-op Plan:   Post-operative Plan: Extubation in OR  Informed Consent: I have reviewed the patients History and Physical, chart, labs and discussed the procedure including the risks, benefits and alternatives for the proposed anesthesia with the patient or authorized representative who has indicated his/her understanding and acceptance.     Dental advisory given  Plan Discussed with: CRNA and Anesthesiologist  Anesthesia Plan Comments:        Anesthesia Quick Evaluation

## 2021-10-05 NOTE — Anesthesia Postprocedure Evaluation (Signed)
Anesthesia Post Note  Patient: [name]  Procedure(s) Performed: RIGHT GROIN WASHOUT (Right) APPLICATION OF RIGHT LEG WOUND VAC (Leg Upper)     Patient location during evaluation: PACU Anesthesia Type: General Level of consciousness: awake and alert Pain management: pain level controlled Vital Signs Assessment: post-procedure vital signs reviewed and stable Respiratory status: spontaneous breathing, nonlabored ventilation and respiratory function stable Cardiovascular status: stable and blood pressure returned to baseline Anesthetic complications: no   No notable events documented.  Last Vitals:  Vitals:   10/05/21 1615 10/05/21 1630  BP: (!) 117/59 125/66  Pulse: 71 70  Resp: 19 18  Temp:    SpO2: 96% 98%    Last Pain:  Vitals:   10/05/21 1600  TempSrc:   PainSc: 0-No pain                 Audry Pili

## 2021-10-05 NOTE — Progress Notes (Signed)
  Progress Note    10/05/2021 2:08 PM Day of Surgery  Subjective: No overnight issues  Vitals:   10/05/21 0846 10/05/21 1314  BP: 109/66 (!) 122/48  Pulse: 71 71  Resp: 15 18  Temp: 98 F (36.7 C) (!) 97.5 F (36.4 C)  SpO2:  99%    Physical Exam: Awake alert oriented On the respirations Right groin wound is open there is obvious hematoma draining  CBC    Component Value Date/Time   WBC 5.2 10/05/2021 0648   RBC 3.37 (L) 10/05/2021 0648   HGB 9.9 (L) 10/05/2021 0648   HCT 33.6 (L) 10/05/2021 0648   PLT 266 10/05/2021 0648   MCV 99.7 10/05/2021 0648   MCH 29.4 10/05/2021 0648   MCHC 29.5 (L) 10/05/2021 0648   RDW 17.2 (H) 10/05/2021 0648   LYMPHSABS 818 (L) 03/08/2021 1117   MONOABS 0.8 05/11/2019 1352   EOSABS 112 03/08/2021 1117   BASOSABS 33 03/08/2021 1117    BMET    Component Value Date/Time   NA 136 10/04/2021 0112   K 4.3 10/04/2021 0112   CL 104 10/04/2021 0112   CO2 27 10/04/2021 0112   GLUCOSE 132 (H) 10/04/2021 0112   BUN 16 10/04/2021 0112   CREATININE 0.99 10/04/2021 0112   CREATININE 0.90 03/08/2021 1117   CALCIUM 8.4 (L) 10/04/2021 0112   GFRNONAA >60 10/04/2021 0112   GFRNONAA 89 11/17/2019 1258   GFRAA 104 11/17/2019 1258    INR    Component Value Date/Time   INR 2.3 (H) 10/05/2021 0648     Intake/Output Summary (Last 24 hours) at 10/05/2021 1408 Last data filed at 10/05/2021 1314 Gross per 24 hour  Intake 339.56 ml  Output 1300 ml  Net -960.44 ml     Assessment:  66 y.o. male is s/p right common femoral to above-knee popliteal artery bypass with vein complicated by hematoma now with opening in the right groin medial thigh area. Plan: OR today for washout of right groin possible wound VAC placement   Breland Elders C. Donzetta Matters, MD [facility] [place] Office: [phone] Pager: [phone]  10/05/2021 2:08 PM

## 2021-10-05 NOTE — Progress Notes (Signed)
Pharmacy Antibiotic Note  [name] is a 66 y.o. male admitted on 09/14/2021 with  R groin wound infection . S/p R fempop artery bypass complicated by hematoma - now foul smelling and s/p washout/wound VAC placed 7/7. Pharmacy has been consulted for Vancomycin and Zosyn dosing.  Pt received Ancef 2gm pre-op today.  Plan: Zosyn 3.375gm IV q8h EI Vancomycin '1750mg'$  IV now then 1000 mg IV Q 12 hrs. Goal AUC 400-550. Expected AUC: 489 SCr used: 0.99 Will f/u renal function, micro data, and pt's clinical condition Vanc levels prn   Height: '5\' 8"'$  (172.7 cm) Weight: 88.5 kg (195 lb) IBW/kg (Calculated) : 68.4  Temp (24hrs), Avg:98 F (36.7 C), Min:97.5 F (36.4 C), Max:98.5 F (36.9 C)  Recent Labs  Lab 10/01/21 0336 10/02/21 0028 10/03/21 0115 10/04/21 0112 10/05/21 0648  WBC 6.5 6.4 5.8 6.8 5.2  CREATININE  --   --   --  0.99  --     Estimated Creatinine Clearance: 80.4 mL/min (by C-G formula based on SCr of 0.99 mg/dL).    Not on File  Antimicrobials this admission: 7/7 Ancef x 1 7/7 Zosyn >>  7/7 Vanc >>    Microbiology results: 7/7 groin wound:  Thank you for allowing pharmacy to be a part of this patient's care.  Sherlon Handing, PharmD, BCPS Please see amion for complete clinical pharmacist phone list 10/05/2021 4:02 PM

## 2021-10-06 ENCOUNTER — Encounter (HOSPITAL_COMMUNITY): Payer: Self-pay | Admitting: Vascular Surgery

## 2021-10-06 LAB — CBC
HCT: 32.2 % — ABNORMAL LOW (ref 39.0–52.0)
Hemoglobin: 9.7 g/dL — ABNORMAL LOW (ref 13.0–17.0)
MCH: 29.6 pg (ref 26.0–34.0)
MCHC: 30.1 g/dL (ref 30.0–36.0)
MCV: 98.2 fL (ref 80.0–100.0)
Platelets: 259 10*3/uL (ref 150–400)
RBC: 3.28 MIL/uL — ABNORMAL LOW (ref 4.22–5.81)
RDW: 16.7 % — ABNORMAL HIGH (ref 11.5–15.5)
WBC: 8.4 10*3/uL (ref 4.0–10.5)
nRBC: 0 % (ref 0.0–0.2)

## 2021-10-06 LAB — PROTIME-INR
INR: 2.6 — ABNORMAL HIGH (ref 0.8–1.2)
Prothrombin Time: 27.7 seconds — ABNORMAL HIGH (ref 11.4–15.2)

## 2021-10-06 LAB — CREATININE, SERUM
Creatinine, Ser: 0.88 mg/dL (ref 0.61–1.24)
GFR, Estimated: >60 mL/min (ref >60)

## 2021-10-06 MED ORDER — WARFARIN SODIUM 4 MG PO TABS
6.5000 mg | ORAL_TABLET | Freq: Once | ORAL | Status: AC
  Administered 2021-10-06: 6.5 mg via ORAL
  Filled 2021-10-06: qty 1

## 2021-10-06 NOTE — Progress Notes (Signed)
ANTICOAGULATION CONSULT NOTE  Pharmacy Consult for Heparin and Warfarin Indication: atrial fibrillation and mechanical MVR, PAD  Patient Measurements: Height: '5\' 8"'$  (172.7 cm) Weight: 88.5 kg (195 lb) IBW/kg (Calculated) : 68.4  Heparin Dosing Weight: 86 kg  Vital Signs: Temp: 98.7 F (37.1 C) (06/24 0728) Temp Source: Oral (06/24 0728) BP: 119/46 (06/24 0728) Pulse Rate: 76 (06/24 0728)  Labs: Recent Labs    10/01/21 0336 10/02/21 0028 10/03/21 0115  HGB 8.6* 9.1* 9.5*  HCT 29.4* 30.0* 30.9*  PLT 260 244 273  LABPROT 18.1* 18.2* 20.1*  INR 1.5* 1.5* 1.7*  HEPARINUNFRC 0.43 0.39 0.59    Estimated Creatinine Clearance: 82 mL/min (by C-G formula based on SCr of 0.97 mg/dL).   Assessment: 66 y.o. male on warfarin PTA for mechanical MVR/AVR, hx afib s/p R femoral to AK popliteal bypass with vein and subsequent hematoma evacuation 6/17. Heparin infusion discontinued per Dr. Donzetta Matters on 7/7. Pharmacy consulted to dose warfarin.   PTA warfarin regimen (per 6/08 anticoagulation note): 7 mg Mon/Wed/Fri 6.5 mg all other days    Previous pharmacist spoke with patient and confirmed he has some supply of enoxaparin for bridge at home and is familiar with using it in the event that patient is discharged before INR therapeutic.  INR 2.6 therapeutic and trending up. Hgb 9.7 stable. Plts wnl.   Goal of Therapy:  INR 2.5-3.5 Monitor platelets by anticoagulation protocol: Yes   Plan:  Warfarin 6.5 mg tonight (as per home regimen)  Monitor CBC, s/sx of bleeding, and INR daily   Eliseo Gum, PharmD PGY1 Pharmacy Resident   10/06/2021  1:26 PM

## 2021-10-06 NOTE — Progress Notes (Addendum)
[facility] [place]  Subjective  - No new complaints and pain is well controlled   Objective (!) 111/51 71 98.6 F (37 C) (Oral) 17 93%  Intake/Output Summary (Last 24 hours) at 10/06/2021 0657 Last data filed at 10/06/2021 0351 Gross per 24 hour  Intake 820 ml  Output 2120 ml  Net -1300 ml    Right LE with doppler DP signal, foot warm to touch Wound vac to suction right groin and mid thigh OP 60 cc bloody drainage Lungs no labored breathing General no acute distress  Assessment/Planning: 66 y.o. male is s/p right common femoral to above-knee popliteal artery bypass with vein complicated by hematoma now with opening in the right groin medial thigh area.   The right groin and mid thigh incisions dehisced and he was returned to the OR for I & D with wound vac placement.  White sponge was placed in the groin incision to protect the bypass anastomosis.  Cultures were taken.  Component 1 d ago  Specimen Description WOUND   Special Requests right groin   Gram Stain NO WBC SEEN  RARE GRAM POSITIVE COCCI IN PAIRS  Performed at [facility], [address] [place], [place] [address]   Culture PENDING   Report Status PENDING   Pending final cultures, he on IV Vanc and Zosyn Plan for wound vac change Monday Coumadin per pharmacy warfarin for mechanical MVR/AVR, hx afib  Roxy Horseman 10/06/2021 6:57 AM -- VASCULAR STAFF ADDENDUM: I have independently interviewed and examined the patient. I agree with the above.    Cassandria Santee, MD [facility] Phone Number: [phone] 10/06/2021 10:24 AM    Laboratory Lab Results: Recent Labs    10/05/21 0648 10/06/21 0035  WBC 5.2 8.4  HGB 9.9* 9.7*  HCT 33.6* 32.2*  PLT 266 259   BMET Recent Labs    10/04/21 0112  NA 136  K 4.3  CL 104  CO2 27  GLUCOSE 132*  BUN 16  CREATININE 0.99  CALCIUM 8.4*    COAG Lab Results  Component Value  Date   INR 2.6 (H) 10/06/2021   INR 2.3 (H) 10/05/2021   INR 2.0 (H) 10/04/2021   No results found for: "PTT"

## 2021-10-07 LAB — PROTIME-INR
INR: 2.9 — ABNORMAL HIGH (ref 0.8–1.2)
Prothrombin Time: 30.4 seconds — ABNORMAL HIGH (ref 11.4–15.2)

## 2021-10-07 LAB — CBC
HCT: 32.8 % — ABNORMAL LOW (ref 39.0–52.0)
Hemoglobin: 9.9 g/dL — ABNORMAL LOW (ref 13.0–17.0)
MCH: 29.1 pg (ref 26.0–34.0)
MCHC: 30.2 g/dL (ref 30.0–36.0)
MCV: 96.5 fL (ref 80.0–100.0)
Platelets: 265 10*3/uL (ref 150–400)
RBC: 3.4 MIL/uL — ABNORMAL LOW (ref 4.22–5.81)
RDW: 16.8 % — ABNORMAL HIGH (ref 11.5–15.5)
WBC: 5.8 10*3/uL (ref 4.0–10.5)
nRBC: 0 % (ref 0.0–0.2)

## 2021-10-07 MED ORDER — WARFARIN SODIUM 2.5 MG PO TABS
6.5000 mg | ORAL_TABLET | Freq: Once | ORAL | Status: AC
Start: 2021-10-07 — End: 2021-10-07
  Administered 2021-10-07: 6.5 mg via ORAL
  Filled 2021-10-07: qty 1

## 2021-10-07 NOTE — Progress Notes (Signed)
ANTICOAGULATION CONSULT NOTE  Pharmacy Consult for Heparin and Warfarin Indication: atrial fibrillation and mechanical MVR, PAD  Patient Measurements: Height: '5\' 8"'$  (172.7 cm) Weight: 88.5 kg (195 lb) IBW/kg (Calculated) : 68.4  Heparin Dosing Weight: 86 kg  Vital Signs: Temp: 98.7 F (37.1 C) (06/24 0728) Temp Source: Oral (06/24 0728) BP: 119/46 (06/24 0728) Pulse Rate: 76 (06/24 0728)  Labs: Recent Labs    10/01/21 0336 10/02/21 0028 10/03/21 0115  HGB 8.6* 9.1* 9.5*  HCT 29.4* 30.0* 30.9*  PLT 260 244 273  LABPROT 18.1* 18.2* 20.1*  INR 1.5* 1.5* 1.7*  HEPARINUNFRC 0.43 0.39 0.59    Estimated Creatinine Clearance: 82 mL/min (by C-G formula based on SCr of 0.97 mg/dL).   Assessment: 66 y.o. male on warfarin PTA for mechanical MVR/AVR, hx afib s/p R femoral to AK popliteal bypass with vein and subsequent hematoma evacuation 6/17. Heparin infusion discontinued per Dr. Donzetta Matters on 7/7. Pharmacy consulted to dose warfarin.   PTA warfarin regimen (per 6/08 anticoagulation note): 7 mg Mon/Wed/Fri 6.5 mg all other days    Previous pharmacist spoke with patient and confirmed he has some supply of enoxaparin for bridge at home and is familiar with using it in the event that patient is discharged before INR therapeutic.  INR 2.9 therapeutic and trending up. Hgb 9.9 stable. Plts wnl.   Goal of Therapy:  INR 2.5-3.5 Monitor platelets by anticoagulation protocol: Yes   Plan:  Warfarin 6.5 mg tonight (as per home regimen)  Monitor CBC, s/sx of bleeding, and INR daily   Eliseo Gum, PharmD PGY1 Pharmacy Resident   10/07/2021  11:59 AM

## 2021-10-07 NOTE — Progress Notes (Signed)
Mobility Specialist: Progress Note   10/07/21 1213  Mobility  Activity Ambulated with assistance to bathroom  Level of Assistance Standby assist, set-up cues, supervision of patient - no hands on  Assistive Device Front wheel walker  RLE Weight Bearing WBAT  Distance Ambulated (ft) 20 ft  Activity Response Tolerated well  $Mobility charge 1 Mobility   Received pt in bed having no complaints and agreeable to mobility. Pt requesting to use BR upon sitting EOB. To BR, instructed pt to pull call string when finished.   [facility] Rosalena Mccorry Mobility Specialist Mobility Specialist [address]: [phone]

## 2021-10-07 NOTE — Progress Notes (Addendum)
[facility] [place]  Subjective  - no new complaints   Objective (!) 104/49 70 97.9 F (36.6 C) (Oral) 18 90%  Intake/Output Summary (Last 24 hours) at 10/07/2021 0807 Last data filed at 10/07/2021 0547 Gross per 24 hour  Intake 672.44 ml  Output 890 ml  Net -217.56 ml    Right LE wound vac to good suction groin and mid thigh wounds. OP 90 cc total last 24 hours. Plan for vac change tomorrow Lungs non labored breathing Doppler signal DP right LE intact  Assessment/Planning: 66 y.o. male is s/p right common femoral to above-knee popliteal artery bypass with vein complicated by hematoma now with opening in the right groin medial thigh area.               The right groin and mid thigh incisions dehisced and he was returned to the OR for I & D with wound vac placement.  White sponge was placed in the groin incision to protect the bypass anastomosis.  Cultures were taken.  Plan for vac change tomorrow.  He was made NPO past MN just in case he needs to return to the OR tomorrow.  White sponge in room and black med and small kits ordered to bedside.  RARE GRAM POSITIVE COCCI IN PAIRS pending final results.  For now cont current antibiotics Vanc and Zosyn  Roxy Horseman 10/07/2021 8:07 AM --  VASCULAR STAFF ADDENDUM: I have independently interviewed and examined the patient. I agree with the above.  Will book for OR tomorrow in case pt can not tolerate bidside VAC change   Cassandria Santee, MD [facility] Phone Number: [phone] 10/07/2021 10:44 AM    Laboratory Lab Results: Recent Labs    10/06/21 0035 10/07/21 0014  WBC 8.4 5.8  HGB 9.7* 9.9*  HCT 32.2* 32.8*  PLT 259 265   BMET Recent Labs    10/06/21 0035  CREATININE 0.88    COAG Lab Results  Component Value Date   INR 2.9 (H) 10/07/2021   INR 2.6 (H) 10/06/2021   INR 2.3 (H) 10/05/2021   No results found for: "PTT"

## 2021-10-07 NOTE — Progress Notes (Signed)
Mobility Specialist: Progress Note   10/07/21 1500  Mobility  Activity Ambulated with assistance in hallway  Level of Assistance Modified independent, requires aide device or extra time  Assistive Device Front wheel walker  RLE Weight Bearing WBAT  Distance Ambulated (ft) 470 ft  Activity Response Tolerated well  $Mobility charge 1 Mobility   Post-Mobility: 75 HR  Received pt in bed having no complaints and agreeable to mobility. Pt was asymptomatic throughout ambulation and returned to room w/o fault. Left in bed w/ call bell in reach and all needs met.  [facility] Henery Betzold Mobility Specialist Mobility Specialist [address]: [phone]

## 2021-10-08 ENCOUNTER — Encounter (HOSPITAL_COMMUNITY): Payer: Self-pay | Admitting: Anesthesiology

## 2021-10-08 ENCOUNTER — Encounter (HOSPITAL_COMMUNITY)
Admission: RE | Disposition: A | Discharge status: Home / Self Care | Payer: Self-pay | Source: Ambulatory Visit | Attending: Vascular Surgery

## 2021-10-08 LAB — CBC
HCT: 31.6 % — ABNORMAL LOW (ref 39.0–52.0)
Hemoglobin: 10 g/dL — ABNORMAL LOW (ref 13.0–17.0)
MCH: 30.1 pg (ref 26.0–34.0)
MCHC: 31.6 g/dL (ref 30.0–36.0)
MCV: 95.2 fL (ref 80.0–100.0)
Platelets: 258 10*3/uL (ref 150–400)
RBC: 3.32 MIL/uL — ABNORMAL LOW (ref 4.22–5.81)
RDW: 16.7 % — ABNORMAL HIGH (ref 11.5–15.5)
WBC: 6 10*3/uL (ref 4.0–10.5)
nRBC: 0 % (ref 0.0–0.2)

## 2021-10-08 LAB — PROTIME-INR
INR: 2.7 — ABNORMAL HIGH (ref 0.8–1.2)
Prothrombin Time: 28.8 seconds — ABNORMAL HIGH (ref 11.4–15.2)

## 2021-10-08 SURGERY — DEBRIDEMENT, WOUND
Anesthesia: Monitor Anesthesia Care | Laterality: Right

## 2021-10-08 MED ORDER — SODIUM CHLORIDE 0.9 % IV SOLN
2.0000 g | Freq: Three times a day (TID) | INTRAVENOUS | Status: DC
  Administered 2021-10-08 – 2021-10-10 (×6): 2 g via INTRAVENOUS
  Filled 2021-10-08 (×6): qty 12.5

## 2021-10-08 MED ORDER — WARFARIN SODIUM 5 MG PO TABS
7.0000 mg | ORAL_TABLET | Freq: Once | ORAL | Status: AC
  Administered 2021-10-08: 7 mg via ORAL
  Filled 2021-10-08: qty 1

## 2021-10-08 MED ORDER — PROPOFOL 10 MG/ML IV BOLUS
INTRAVENOUS | Status: AC
  Filled 2021-10-08: qty 20

## 2021-10-08 MED ORDER — METRONIDAZOLE 500 MG/100ML IV SOLN
500.0000 mg | Freq: Two times a day (BID) | INTRAVENOUS | Status: DC
  Administered 2021-10-08 – 2021-10-10 (×4): 500 mg via INTRAVENOUS
  Filled 2021-10-08 (×4): qty 100

## 2021-10-08 NOTE — Progress Notes (Signed)
Pharmacy Antibiotic Note  [name] is a 66 y.o. male admitted on 09/14/2021 with  R groin wound infection . S/p R fempop artery bypass complicated by hematoma - now foul smelling and s/p washout/wound VAC placed 7/7.   Wound cx growing pan-sensitive pseudomonas and enterobacter (R cefazolin). WBC 6, Afebrile. On day #4 of antibiotics. Discussed with vascular and okay to change regimen to cefepime/metronidazole (while culture on hold for anaerobe growth).  Plan: Stop zosyn/vancomycin Start cefepime 2g IV every 8 hours Start metronidazole 500 mg IV every 12 hours  Will f/u renal function, micro data, and pt's clinical condition   Height: '5\' 8"'$  (172.7 cm) Weight: 88.5 kg (195 lb) IBW/kg (Calculated) : 68.4  Temp (24hrs), Avg:98.1 F (36.7 C), Min:97.8 F (36.6 C), Max:98.4 F (36.9 C)  Recent Labs  Lab 10/04/21 0112 10/05/21 0648 10/06/21 0035 10/07/21 0014 10/08/21 0057  WBC 6.8 5.2 8.4 5.8 6.0  CREATININE 0.99  --  0.88  --   --      Estimated Creatinine Clearance: 90.4 mL/min (by C-G formula based on SCr of 0.88 mg/dL).    Not on File  Antimicrobials this admission: 7/7 Ancef x 1 7/7 Zosyn >> 7/10 7/7 Vanc >> 7/10 7/10 Cefepime>> 7/10 Metronidazole>>   Microbiology results: 7/7 groin wound: pseudomonas (pan-sens), enterobacter (R cefazolin)  Thank you for allowing pharmacy to be a part of this patient's care.  Antonietta Jewel, PharmD, BCCCP Clinical Pharmacist  Phone: [phone] 10/08/2021 1:45 PM  Please check AMION for all [facility] phone numbers After 10:00 PM, call [place] [phone]

## 2021-10-08 NOTE — Anesthesia Preprocedure Evaluation (Signed)
Anesthesia Evaluation    Reviewed: Allergy & Precautions, Patient's Chart, lab work & pertinent test results  History of Anesthesia Complications Negative for: history of anesthetic complications  Airway        Dental   Pulmonary shortness of breath, COPD, Current Smoker and Patient abstained from smoking.,           Cardiovascular hypertension, Pt. on medications (-) angina+ CAD, + Past MI, + Cardiac Stents and + Peripheral Vascular Disease  + dysrhythmias + Valvular Problems/Murmurs    '23 TTE - EF 60 to 65%. Left atrial size was moderately dilated. Right atrial size was mildly dilated. The mitral valve has been repaired/replaced. Echo findings are consistent with normal structure and function of the mitral valve prosthesis. The aortic valve has been repaired/replaced. Aortic valve regurgitation is trivial.    Neuro/Psych CVA    GI/Hepatic Neg liver ROS, hiatal hernia,   Endo/Other  negative endocrine ROS  Renal/GU Lab Results      Component                Value               Date                      CREATININE               1.12                09/15/2021                Musculoskeletal negative musculoskeletal ROS (+)   Abdominal   Peds  Hematology  (+) Blood dyscrasia, anemia ,  On coumadin    Anesthesia Other Findings   Reproductive/Obstetrics                             Anesthesia Physical  Anesthesia Plan  ASA: 4  Anesthesia Plan: General   Post-op Pain Management: Tylenol PO (pre-op)*   Induction: Intravenous  PONV Risk Score and Plan: 1 and Ondansetron, Dexamethasone and Treatment may vary due to age or medical condition  Airway Management Planned: LMA  Additional Equipment: None  Intra-op Plan:   Post-operative Plan: Extubation in OR  Informed Consent:   Plan Discussed with: CRNA and Anesthesiologist  Anesthesia Plan Comments:         Anesthesia Quick  Evaluation

## 2021-10-08 NOTE — Progress Notes (Signed)
Occupational Therapy Treatment Patient Details Name: [name] MRN: [id number] DOB: [birth year] Today's Date: 10/08/2021   History of present illness Pt adm 6/16 for rt femoral popliteal bypass graft. Underwent emergent surgery 6/17 for hemorrhagic shock,  reexploration of RLE, angiogram of right lower extremity and application of wound vac  PMH - RLE ulcer, copd, cad, HTN, AVR, MVR, PVD, CVA   OT comments  Patient received in supine and agreeable to OT session. Patient able to get to EOB without assistance. Patient was educated on sock aide use for LB dressing and provided demonstration. Patient was able to return demonstration and required mod assist to donn socks. Toilet transfer address with transfer training in room and static standing balance. Acute OT to continue to follow.    Recommendations for follow up therapy are one component of a multi-disciplinary discharge planning process, led by the attending physician.  Recommendations may be updated based on patient status, additional functional criteria and insurance authorization.    Follow Up Recommendations  Home health OT    Assistance Recommended at Discharge Intermittent Supervision/Assistance  Patient can return home with the following  A little help with bathing/dressing/bathroom;Assistance with cooking/housework;Assist for transportation   Equipment Recommendations  BSC/3in1    Recommendations for Other Services      Precautions / Restrictions Precautions Precautions: None Precaution Comments: wound vac; jp drain Restrictions Weight Bearing Restrictions: Yes RLE Weight Bearing: Weight bearing as tolerated       Mobility Bed Mobility Overal bed mobility: Needs Assistance Bed Mobility: Supine to Sit, Sit to Supine     Supine to sit: Supervision, HOB elevated Sit to supine: Supervision, HOB elevated   General bed mobility comments: patient returned to supine due to complaints of discomfort in recliner     Transfers Overall transfer level: Needs assistance Equipment used: Rolling walker (2 wheels) Transfers: Sit to/from Stand Sit to Stand: Min guard           General transfer comment: min guard for safety     Balance Overall balance assessment: Needs assistance Sitting-balance support: Feet supported, No upper extremity supported Sitting balance-Leahy Scale: Good Sitting balance - Comments: able to perform LB dressing seated on EOB   Standing balance support: No upper extremity supported, Bilateral upper extremity supported Standing balance-Leahy Scale: Fair Standing balance comment: static standing performed at EOB                           ADL either performed or assessed with clinical judgement   ADL Overall ADL's : Needs assistance/impaired                     Lower Body Dressing: Moderate assistance;Sitting/lateral leans;With adaptive equipment Lower Body Dressing Details (indicate cue type and reason): adaptive equipment training with sock aide and mod assist Toilet Transfer: Min guard;Rolling walker (2 wheels) Toilet Transfer Details (indicate cue type and reason): simulated to recliner           General ADL Comments: Adaptive equipment training for LB dressing    Extremity/Trunk Assessment              Vision       Perception     Praxis      Cognition Arousal/Alertness: Awake/alert Behavior During Therapy: WFL for tasks assessed/performed Overall Cognitive Status: Within Functional Limits for tasks assessed  Exercises      Shoulder Instructions       General Comments      Pertinent Vitals/ Pain       Pain Assessment Pain Assessment: Faces Faces Pain Scale: Hurts little more Pain Location: RLE Pain Descriptors / Indicators: Discomfort, Grimacing Pain Intervention(s): Limited activity within patient's tolerance, Monitored during session, Repositioned  Home  Living                                          Prior Functioning/Environment              Frequency  Min 2X/week        Progress Toward Goals  OT Goals(current goals can now be found in the care plan section)  Progress towards OT goals: Progressing toward goals  Acute Rehab OT Goals Patient Stated Goal: go home OT Goal Formulation: With patient Time For Goal Achievement: 10/15/21 Potential to Achieve Goals: Good ADL Goals Pt Will Perform Lower Body Bathing: with modified independence;with adaptive equipment;sit to/from stand Pt Will Perform Lower Body Dressing: with modified independence;with adaptive equipment;sit to/from stand Pt Will Transfer to Toilet: with modified independence;ambulating Pt Will Perform Toileting - Clothing Manipulation and hygiene: with modified independence;sit to/from stand Additional ADL Goal #1: Pt will independently manage scrotal sling to reduce pain and edema and increase independence with ADL/mobility  Plan Discharge plan needs to be updated    Co-evaluation                 AM-PAC OT "6 Clicks" Daily Activity     Outcome Measure   Help from another person eating meals?: None Help from another person taking care of personal grooming?: None Help from another person toileting, which includes using toliet, bedpan, or urinal?: A Lot Help from another person bathing (including washing, rinsing, drying)?: A Lot Help from another person to put on and taking off regular upper body clothing?: A Little Help from another person to put on and taking off regular lower body clothing?: A Lot 6 Click Score: 17    End of Session Equipment Utilized During Treatment: Rolling walker (2 wheels)  OT Visit Diagnosis: Unsteadiness on feet (R26.81);Other abnormalities of gait and mobility (R26.89);Muscle weakness (generalized) (M62.81);Pain Pain - Right/Left: Right Pain - part of body: Leg   Activity Tolerance Patient tolerated  treatment well   Patient Left in bed;with call bell/phone within reach;with family/visitor present   Nurse Communication Mobility status        Time: 8101-7510 OT Time Calculation (min): 37 min  Charges: OT General Charges $OT Visit: 1 Visit OT Treatments $Self Care/Home Management : 8-22 mins $Therapeutic Activity: 8-22 mins  Lodema Hong, [place]  Office [phone]   Trixie Dredge 10/08/2021, 2:10 PM

## 2021-10-08 NOTE — Progress Notes (Signed)
ANTICOAGULATION CONSULT NOTE  Pharmacy Consult for Heparin and Warfarin Indication: atrial fibrillation and mechanical MVR, PAD  Patient Measurements: Height: '5\' 8"'$  (172.7 cm) Weight: 88.5 kg (195 lb) IBW/kg (Calculated) : 68.4  Heparin Dosing Weight: 86 kg  Vital Signs: Temp: 98.7 F (37.1 C) (06/24 0728) Temp Source: Oral (06/24 0728) BP: 119/46 (06/24 0728) Pulse Rate: 76 (06/24 0728)  Labs: Recent Labs    10/01/21 0336 10/02/21 0028 10/03/21 0115  HGB 8.6* 9.1* 9.5*  HCT 29.4* 30.0* 30.9*  PLT 260 244 273  LABPROT 18.1* 18.2* 20.1*  INR 1.5* 1.5* 1.7*  HEPARINUNFRC 0.43 0.39 0.59    Estimated Creatinine Clearance: 82 mL/min (by C-G formula based on SCr of 0.97 mg/dL).   Assessment: 66 y.o. male on warfarin PTA for mechanical MVR/AVR, hx afib s/p R femoral to AK popliteal bypass with vein and subsequent hematoma evacuation 6/17. Heparin infusion discontinued per Dr. Donzetta Matters on 7/7. Pharmacy consulted to dose warfarin.   PTA warfarin regimen (per 6/08 anticoagulation note): 7 mg Mon/Wed/Fri 6.5 mg all other days    Previous pharmacist spoke with patient and confirmed he has some supply of enoxaparin for bridge at home and is familiar with using it in the event that patient is discharged with subtherapeutic INR.  INR 2.7 therapeutic, Hgb and RBCs low but stable. Plts wnl.   Goal of Therapy:  INR 2.5-3.5 Monitor platelets by anticoagulation protocol: Yes   Plan:  Warfarin '7mg'$  tonight (as per home regimen)  Monitor CBC, s/sx of bleeding, and INR daily   Titus Dubin, PharmD PGY1 Pharmacy Resident 10/08/2021 11:01 AM

## 2021-10-08 NOTE — Progress Notes (Signed)
Mobility Specialist: Progress Note   10/08/21 1014  Mobility  Activity Ambulated with assistance in hallway  Level of Assistance Modified independent, requires aide device or extra time  Assistive Device Front wheel walker  RLE Weight Bearing WBAT  Distance Ambulated (ft) 470 ft  Activity Response Tolerated well  $Mobility charge 1 Mobility   Pre-Mobility: 74 HR Post-Mobility: 75 HR  Pt received in the bed and agreeable to mobility. C/o mild tightness in RLE, otherwise no c/o throughout ambulation. Pt back to bed per request with call bell at his side.   [facility] [name] Mobility Specialist Mobility Specialist [address] [place]: [phone]

## 2021-10-08 NOTE — Progress Notes (Addendum)
  Progress Note    10/08/2021 9:05 AM 3 Days Post-Op  Subjective:  no complaints; he is looking forward to going home to see his lab.  afebrile  Vitals:   10/08/21 0414 10/08/21 0811  BP: (!) 111/55 (!) 124/54  Pulse: 71 65  Resp: 15 12  Temp: 97.8 F (36.6 C) 97.9 F (36.6 C)  SpO2: 94% 96%    Physical Exam: Cardiac:  regular Lungs:  non labored Incisions:    Right groin 8x4x2cm   Right thigh 4x1x1cm   Extremities:  +doppler signal right PT/DP Abdomen:  soft  CBC    Component Value Date/Time   WBC 6.0 10/08/2021 0057   RBC 3.32 (L) 10/08/2021 0057   HGB 10.0 (L) 10/08/2021 0057   HCT 31.6 (L) 10/08/2021 0057   PLT 258 10/08/2021 0057   MCV 95.2 10/08/2021 0057   MCH 30.1 10/08/2021 0057   MCHC 31.6 10/08/2021 0057   RDW 16.7 (H) 10/08/2021 0057   LYMPHSABS 818 (L) 03/08/2021 1117   MONOABS 0.8 05/11/2019 1352   EOSABS 112 03/08/2021 1117   BASOSABS 33 03/08/2021 1117    BMET    Component Value Date/Time   NA 136 10/04/2021 0112   K 4.3 10/04/2021 0112   CL 104 10/04/2021 0112   CO2 27 10/04/2021 0112   GLUCOSE 132 (H) 10/04/2021 0112   BUN 16 10/04/2021 0112   CREATININE 0.88 10/06/2021 0035   CREATININE 0.90 03/08/2021 1117   CALCIUM 8.4 (L) 10/04/2021 0112   GFRNONAA >60 10/06/2021 0035   GFRNONAA 89 11/17/2019 1258   GFRAA 104 11/17/2019 1258    INR    Component Value Date/Time   INR 2.7 (H) 10/08/2021 0057     Intake/Output Summary (Last 24 hours) at 10/08/2021 0905 Last data filed at 10/08/2021 0800 Gross per 24 hour  Intake --  Output 1550 ml  Net -1550 ml     Assessment/Plan:  66 y.o. male is s/p:  Washout of right groin and right thigh wounds and placement of negative pressure dressing  3 Days Post-Op   -wound vac changed this morning on right groin and right thigh.  Wounds are clean.  Will plan for vac change again on Monday morning with Dr. Donzetta Matters.  If wounds look good, will plan for discharge that day with [facility] for vac  changes -DVT prophylaxis:  coumadin for mechanical MVR and AVR.  INR 2.7 today -non adherent dressing removed from thigh incision.  Will leave this off as there was some maceration present.   -continue using IS and mobilizing   Leontine Locket, PA-C [facility] [phone] 10/08/2021 9:05 AM   I have interviewed and examined and changed wound VAC on right lower extremity with PA and agree with assessment and plan above.   Donnell Wion C. Donzetta Matters, MD [facility] [place] Office: [phone] Pager: [phone]

## 2021-10-08 NOTE — TOC Progression Note (Addendum)
Transition of Care [facility]) - Progression Note    Patient Details  Name: [name] MRN: [id number] Date of Birth: 1955-08-24  [facility] of [facility]) CM/SW Contact  Jacalyn Lefevre Edson Snowball, RN Phone Number: 10/08/2021, 10:44 AM  Clinical Narrative:     Placed home VAC application on chart. Paged Leontine Locket PA. Leontine Locket PA currently in surgery , left message with surgery staff returning page. Also asked about discharge date   Called Amy with [facility] . She is aware patient has VAC and possible discharge    Patient and wife aware of above.   Once [facility] paperwork signed will fax to [place] at [facility] . Once approved by insurance [facility] will release VAC to patient   Patient and wife aware of above.   Discharge Wednesday after VAC change. Amy with [facility] and Olivia Mackie with 3 M aware   1338 Signed VAC form sent to [facility] at [facility]  Expected Discharge Plan: [place] Barriers to Discharge: Continued Medical Work up  Expected Discharge Plan and Services Expected Discharge Plan:    Discharge Planning Services: CM Consult Post Acute Care Choice: [place] arrangements for the past 2 months: Single Family Home                           HH Arranged: RN, OT [facility] Agency: [place] Date [place]: 10/04/21 Time [place]: 1030 Representative spoke with at [place]: [place] ([place]) Interventions    Readmission Risk Interventions     No data to display

## 2021-10-09 LAB — CBC
HCT: 31.6 % — ABNORMAL LOW (ref 39.0–52.0)
Hemoglobin: 9.7 g/dL — ABNORMAL LOW (ref 13.0–17.0)
MCH: 29.6 pg (ref 26.0–34.0)
MCHC: 30.7 g/dL (ref 30.0–36.0)
MCV: 96.3 fL (ref 80.0–100.0)
Platelets: 240 10*3/uL (ref 150–400)
RBC: 3.28 MIL/uL — ABNORMAL LOW (ref 4.22–5.81)
RDW: 16.4 % — ABNORMAL HIGH (ref 11.5–15.5)
WBC: 5.1 10*3/uL (ref 4.0–10.5)
nRBC: 0 % (ref 0.0–0.2)

## 2021-10-09 LAB — AEROBIC/ANAEROBIC CULTURE W GRAM STAIN (SURGICAL/DEEP WOUND): Gram Stain: NONE SEEN

## 2021-10-09 LAB — PROTIME-INR
INR: 3 — ABNORMAL HIGH (ref 0.8–1.2)
Prothrombin Time: 31 seconds — ABNORMAL HIGH (ref 11.4–15.2)

## 2021-10-09 MED ORDER — WARFARIN SODIUM 4 MG PO TABS
6.5000 mg | ORAL_TABLET | Freq: Once | ORAL | Status: AC
  Administered 2021-10-09: 6.5 mg via ORAL
  Filled 2021-10-09: qty 1

## 2021-10-09 NOTE — Progress Notes (Signed)
Physical Therapy Treatment Patient Details Name: [name] MRN: [id number] DOB: 1955-10-01 Today's Date: 10/09/2021   History of Present Illness Pt adm 6/16 for rt femoral popliteal bypass graft. Underwent emergent surgery 6/17 for hemorrhagic shock,  reexploration of RLE, angiogram of right lower extremity and application of wound vac  PMH - RLE ulcer, copd, cad, HTN, AVR, MVR, PVD, CVA    PT Comments    Pt eager and motivated for OOB mobility in light of hopeful d/c to home this week. Pt demonstrating increased ambulation endurance this session with RW and supervision for lines/leads. Pt able to maintain conversation throughout, no LOB, and gait mechanics progressing with more narrow BOS and increased heel strike with increased distance. Continued education re; activity recommendations, increasing activity slowly and continued benefits of mobility with pt verbalizing understanding. Pt continues to benefit from skilled PT services to progress toward functional mobility goals.    Recommendations for follow up therapy are one component of a multi-disciplinary discharge planning process, led by the attending physician.  Recommendations may be updated based on patient status, additional functional criteria and insurance authorization.  Follow Up Recommendations  No PT follow up     Assistance Recommended at Discharge PRN  Patient can return home with the following Assist for transportation   Equipment Recommendations  None recommended by PT    Recommendations for Other Services       Precautions / Restrictions Precautions Precautions: None Precaution Comments: wound vac; Required Braces or Orthoses: Other Brace Other Brace: scrotal sling for mobility Restrictions Weight Bearing Restrictions: Yes RLE Weight Bearing: Weight bearing as tolerated     Mobility  Bed Mobility Overal bed mobility: Needs Assistance, Modified Independent       Supine to sit: Modified independent  (Device/Increase time) Sit to supine: Modified independent (Device/Increase time)   General bed mobility comments: increased time, HOB elevated and use of rail    Transfers Overall transfer level: Needs assistance Equipment used: Rolling walker (2 wheels) Transfers: Sit to/from Stand Sit to Stand: Supervision           General transfer comment: supervision for safety and lines    Ambulation/Gait Ambulation/Gait assistance: Supervision Gait Distance (Feet): 570 Feet Assistive device: Rolling walker (2 wheels) Gait Pattern/deviations: Step-through pattern, Trunk flexed, Decreased stride length, Decreased weight shift to right, Antalgic Gait velocity: decreased     General Gait Details: Supervision for duration of distance. Slow, guarded, and antalgic but progressed gradually, Cues for proximity to AD and upright stance as tolerated. No LOB noted with AD, using UEs moderately to unload RLE.   Stairs             Wheelchair Mobility    Modified Rankin (Stroke Patients Only)       Balance Overall balance assessment: Needs assistance Sitting-balance support: Feet supported, No upper extremity supported Sitting balance-Leahy Scale: Good Sitting balance - Comments: able to perform LB dressing seated on EOB   Standing balance support: No upper extremity supported, Bilateral upper extremity supported Standing balance-Leahy Scale: Fair Standing balance comment: static standing performed at EOB                            Cognition Arousal/Alertness: Awake/alert Behavior During Therapy: WFL for tasks assessed/performed Overall Cognitive Status: Within Functional Limits for tasks assessed  General Comments: motivated to get out of bed        Exercises      General Comments        Pertinent Vitals/Pain Pain Assessment Pain Assessment: Faces Faces Pain Scale: Hurts a little bit Pain Location: RLE Pain  Descriptors / Indicators: Tightness Pain Intervention(s): Monitored during session, Limited activity within patient's tolerance    Home Living                          Prior Function            PT Goals (current goals can now be found in the care plan section) Acute Rehab PT Goals Patient Stated Goal: return home PT Goal Formulation: With patient Time For Goal Achievement: 10/15/21    Frequency    Min 3X/week      PT Plan Current plan remains appropriate    Co-evaluation              AM-PAC PT "6 Clicks" Mobility   Outcome Measure  Help needed turning from your back to your side while in a flat bed without using bedrails?: None Help needed moving from lying on your back to sitting on the side of a flat bed without using bedrails?: A Little Help needed moving to and from a bed to a chair (including a wheelchair)?: A Little Help needed standing up from a chair using your arms (e.g., wheelchair or bedside chair)?: A Little Help needed to walk in hospital room?: A Little Help needed climbing 3-5 steps with a railing? : A Little 6 Click Score: 19    End of Session   Activity Tolerance: Patient tolerated treatment well Patient left: in bed;with call bell/phone within reach Nurse Communication: Mobility status PT Visit Diagnosis: Other abnormalities of gait and mobility (R26.89);Pain Pain - Right/Left: Right Pain - part of body: Leg     Time: 1555-1611 PT Time Calculation (min) (ACUTE ONLY): 16 min  Charges:  $Therapeutic Activity: 8-22 mins                     Merisa Julio R. PTA Acute Rehabilitation Services Office: [place] 10/09/2021, 4:36 PM

## 2021-10-09 NOTE — Progress Notes (Signed)
Mobility Specialist: Progress Note   10/09/21 1151  Mobility  Activity Ambulated with assistance in hallway  Level of Assistance Modified independent, requires aide device or extra time  Assistive Device Front wheel walker  RLE Weight Bearing WBAT  Distance Ambulated (ft) 470 ft  Activity Response Tolerated well  $Mobility charge 1 Mobility   Pre-Mobility: 73 HR Post-Mobility: 76 HR  Received pt in bed having no complaints and agreeable to mobility. Pt was asymptomatic throughout ambulation and returned to room w/o fault. Left sitting EOB w/ call bell in reach and all needs met.  [facility] [name] Mobility Specialist Mobility Specialist [address]: [phone]

## 2021-10-09 NOTE — Progress Notes (Signed)
Medium and small vac sponge placed at bedside as ordered. For [facility] change 10/10/21 Aamori Mcmasters, Bettina Gavia RN

## 2021-10-09 NOTE — Progress Notes (Signed)
[facility] [place]  Subjective  - No new issues   Objective 124/62 72 98 F (36.7 C) (Oral) 18 98%  Intake/Output Summary (Last 24 hours) at 10/09/2021 0645 Last data filed at 10/09/2021 9449 Gross per 24 hour  Intake 640 ml  Output 1100 ml  Net -460 ml    Right groin, mid thigh vac with good seal Feet warm well perfused  Mild edema in the right LE Lungs non labored breathing   Assessment/Planning: 66 y.o. male is s/p:  Washout of right groin and right thigh wounds and placement of negative pressure dressing  Plan for vac change 10/10/21 DVT prophylaxis:  coumadin for mechanical MVR and AVR.  INR 2.7 today Elevation of LE above the heart level demonstrated with bed trendelenburg Plan for discharge home with [facility] Will ordered vac sponges to bedside for change tomorrow       Roxy Horseman 10/09/2021 6:45 AM --  Laboratory Lab Results: Recent Labs    10/08/21 0057 10/09/21 0317  WBC 6.0 5.1  HGB 10.0* 9.7*  HCT 31.6* 31.6*  PLT 258 240   BMET No results for input(s): "NA", "K", "CL", "CO2", "GLUCOSE", "BUN", "CREATININE", "CALCIUM" in the last 72 hours.  COAG Lab Results  Component Value Date   INR 3.0 (H) 10/09/2021   INR 2.7 (H) 10/08/2021   INR 2.9 (H) 10/07/2021   No results found for: "PTT"

## 2021-10-09 NOTE — Progress Notes (Signed)
ANTICOAGULATION CONSULT NOTE  Pharmacy Consult for Heparin and Warfarin Indication: atrial fibrillation and mechanical MVR, PAD  Patient Measurements: Height: '5\' 8"'$  (172.7 cm) Weight: 88.5 kg (195 lb) IBW/kg (Calculated) : 68.4  Heparin Dosing Weight: 86 kg  Vital Signs: Temp: 98.7 F (37.1 C) (06/24 0728) Temp Source: Oral (06/24 0728) BP: 119/46 (06/24 0728) Pulse Rate: 76 (06/24 0728)  Labs: Recent Labs    10/01/21 0336 10/02/21 0028 10/03/21 0115  HGB 8.6* 9.1* 9.5*  HCT 29.4* 30.0* 30.9*  PLT 260 244 273  LABPROT 18.1* 18.2* 20.1*  INR 1.5* 1.5* 1.7*  HEPARINUNFRC 0.43 0.39 0.59    Estimated Creatinine Clearance: 82 mL/min (by C-G formula based on SCr of 0.97 mg/dL).   Assessment: 66 y.o. male on warfarin PTA for mechanical MVR/AVR, hx afib s/p R femoral to AK popliteal bypass with vein and subsequent hematoma evacuation 6/17. Heparin infusion discontinued per Dr. Donzetta Matters on 7/7. Pharmacy consulted to dose warfarin.   DDIs: Cefepime and metronidazole may increase INR    PTA warfarin regimen (per 6/08 anticoagulation note): 7 mg Mon/Wed/Fri 6.5 mg all other days    Previous pharmacist spoke with patient and confirmed he has some supply of enoxaparin for bridge at home and is familiar with using it in the event that patient is discharged with subtherapeutic INR.  INR 3.0 therapeutic, Hgb and RBCs low but stable. Plts wnl.   Goal of Therapy:  INR 2.5-3.5 Monitor platelets by anticoagulation protocol: Yes   Plan:  Warfarin 6.'5mg'$  tonight (as per home regimen)  Monitor CBC, s/sx of bleeding, and INR daily   Titus Dubin, PharmD PGY1 Pharmacy Resident 10/09/2021 10:46 AM

## 2021-10-09 NOTE — Plan of Care (Signed)
  Problem: Education: Goal: Knowledge of General Education information will improve Description: Including pain rating scale, medication(s)/side effects and non-pharmacologic comfort measures Outcome: Progressing   Problem: Health Behavior/Discharge Planning: Goal: Ability to manage health-related needs will improve Outcome: Progressing   Problem: Clinical Measurements: Goal: Ability to maintain clinical measurements within normal limits will improve Outcome: Progressing Goal: Will remain free from infection Outcome: Progressing Goal: Diagnostic test results will improve Outcome: Progressing Goal: Respiratory complications will improve Outcome: Progressing Goal: Cardiovascular complication will be avoided Outcome: Progressing   Problem: Coping: Goal: Level of anxiety will decrease Outcome: Progressing   Problem: Elimination: Goal: Will not experience complications related to bowel motility Outcome: Progressing Goal: Will not experience complications related to urinary retention Outcome: Progressing   Problem: Safety: Goal: Ability to remain free from injury will improve Outcome: Progressing   Problem: Skin Integrity: Goal: Risk for impaired skin integrity will decrease Outcome: Progressing   Problem: Clinical Measurements: Goal: Ability to maintain clinical measurements within normal limits will improve Outcome: Progressing Goal: Will remain free from infection Outcome: Progressing Goal: Diagnostic test results will improve Outcome: Progressing Goal: Respiratory complications will improve Outcome: Progressing Goal: Cardiovascular complication will be avoided Outcome: Progressing

## 2021-10-10 ENCOUNTER — Other Ambulatory Visit (HOSPITAL_COMMUNITY): Payer: Self-pay

## 2021-10-10 LAB — CBC
HCT: 34.4 % — ABNORMAL LOW (ref 39.0–52.0)
Hemoglobin: 10.5 g/dL — ABNORMAL LOW (ref 13.0–17.0)
MCH: 29.6 pg (ref 26.0–34.0)
MCHC: 30.5 g/dL (ref 30.0–36.0)
MCV: 96.9 fL (ref 80.0–100.0)
Platelets: 215 10*3/uL (ref 150–400)
RBC: 3.55 MIL/uL — ABNORMAL LOW (ref 4.22–5.81)
RDW: 16.3 % — ABNORMAL HIGH (ref 11.5–15.5)
WBC: 5 10*3/uL (ref 4.0–10.5)
nRBC: 0 % (ref 0.0–0.2)

## 2021-10-10 LAB — PROTIME-INR
INR: 2.7 — ABNORMAL HIGH (ref 0.8–1.2)
Prothrombin Time: 28.1 seconds — ABNORMAL HIGH (ref 11.4–15.2)

## 2021-10-10 MED ORDER — OXYCODONE-ACETAMINOPHEN 5-325 MG PO TABS
1.0000 | ORAL_TABLET | Freq: Four times a day (QID) | ORAL | 0 refills | Status: DC | PRN
  Filled 2021-10-10: qty 28, 7d supply, fill #0

## 2021-10-10 MED ORDER — METRONIDAZOLE 500 MG PO TABS
500.0000 mg | ORAL_TABLET | Freq: Three times a day (TID) | ORAL | 0 refills | Status: AC
  Filled 2021-10-10: qty 42, 14d supply, fill #0

## 2021-10-10 MED ORDER — LEVOFLOXACIN 750 MG PO TABS
750.0000 mg | ORAL_TABLET | Freq: Every day | ORAL | 0 refills | Status: AC
  Filled 2021-10-10: qty 14, 14d supply, fill #0

## 2021-10-10 NOTE — Progress Notes (Signed)
Discussed with pt that his Levaquin and Flagyl can alter his coumadin levels.    He has a home testing kit and will test his INR on 10/12/2021 and report to coumadin clinic for any adjustments.  He and his wife know to keep a close eye on this over the next two weeks while he is on abx.  Vac supplies just delivered to his room.  He will discharge this afternoon.  Discussed with him that he can shower on M/W/F prior to [facility] arriving for vac change.  He knows to clamp the tubing and disconnect from machine while taking a quick shower.  They both expressed understanding.  Discussed keeping other incisions clean and dry and ok for soap and water.    Our office will call for follow up appt in 2 weeks for vac change.  He knows to bring two sponge packages and a cannister with him to that appt.    Leontine Locket, PAC. 10/10/2021 11:35 AM

## 2021-10-10 NOTE — Progress Notes (Addendum)
Pharmacy Antibiotic Note  [name] is a 66 y.o. male admitted on 09/14/2021 with  R groin wound infection . S/p R fempop artery bypass complicated by hematoma - now foul smelling and s/p washout/wound VAC placed 7/7.   Wound cx growing pan-sensitive pseudomonas and enterobacter (R cefazolin) and B.frag. WBC 5, Afebrile. On day #5 of antibiotics. Discussed with vascular and can consider levaquin and flagyl at discharge.   Plan: Continue cefepime and flagyl at current dosing Consider levaquin 750 q24 and flagyl 500 mg tid at discharge    Height: '5\' 8"'$  (172.7 cm) Weight: 88.5 kg (195 lb) IBW/kg (Calculated) : 68.4  Temp (24hrs), Avg:98 F (36.7 C), Min:97.3 F (36.3 C), Max:98.5 F (36.9 C)  Recent Labs  Lab 10/04/21 0112 10/05/21 0648 10/06/21 0035 10/07/21 0014 10/08/21 0057 10/09/21 0317 10/10/21 0242  WBC 6.8   < > 8.4 5.8 6.0 5.1 5.0  CREATININE 0.99  --  0.88  --   --   --   --    < > = values in this interval not displayed.     Estimated Creatinine Clearance: 90.4 mL/min (by C-G formula based on SCr of 0.88 mg/dL).    Not on File  Antimicrobials this admission: 7/7 Ancef x 1 7/7 Zosyn >> 7/10 7/7 Vanc >> 7/10 7/10 Cefepime>> 7/10 Metronidazole>>   Microbiology results: 7/7 groin wound: pseudomonas (pan-sens), enterobacter (R cefazolin) B.frag  Thank you for allowing pharmacy to be a part of this patient's care.  Erin Hearing PharmD., BCPS Clinical Pharmacist 10/10/2021 10:31 AM

## 2021-10-10 NOTE — Plan of Care (Signed)
  Problem: Health Behavior/Discharge Planning: Goal: Ability to manage health-related needs will improve Outcome: Progressing   

## 2021-10-10 NOTE — TOC Progression Note (Signed)
Transition of Care [facility]) - Progression Note    Patient Details  Name: [name] MRN: [id number] Date of Birth: 03-10-1956  Transition of Care [facility] CM/SW Contact  Loletha Grayer Beverely Pace, RN Phone Number: 10/10/2021, 12:03 PM  Clinical Narrative:   Olivia Mackie with [employer] has delivered [facility] and supplies to patient's room. Home Health is in place, no further TOC needs identified.     Expected Discharge Plan: [place] Barriers to Discharge: Continued Medical Work up  Expected Discharge Plan and Services Expected Discharge Plan: [name]   Discharge Planning Services: CM Consult Post Acute Care Choice: [place] arrangements for the past 2 months: Single Family Home Expected Discharge Date: 10/10/21                         HH Arranged: RN, OT HH Agency: Donna Date [facility] Agency Contacted: 10/04/21 Time [place]: 1030 Representative spoke with at [place]: [place] Determinants of Health ([place]) Interventions    Readmission Risk Interventions     No data to display

## 2021-10-10 NOTE — Progress Notes (Signed)
Occupational Therapy Treatment Patient Details Name: [name] MRN: [id number] DOB: 10/02/1955 Today's Date: 10/10/2021   History of present illness Pt adm 6/16 for rt femoral popliteal bypass graft. Underwent emergent surgery 6/17 for hemorrhagic shock,  reexploration of RLE, angiogram of right lower extremity and application of wound vac  PMH - RLE ulcer, copd, cad, HTN, AVR, MVR, PVD, CVA   OT comments  Pt making excellent progress towards OT goals this session. Pt continues to present with increased pain, increase scrotal swelling, impaired ability to access LB d/t pain . Session focus on problem solving ADL tasks d/t deficits listed above. Issued pt male urinal to assist with aim during toileting as pt reports difficulty with toileting d/t swollen scrotum. Also recommended elevated BSC over toilet to increase height of toilet to assist with aim during urination. Reviewed AE for LB dressing/bathing and education on compensatory method for LB dressing d/t wound vacs. Pt would continue to benefit from skilled occupational therapy while admitted and after d/c to address the below listed limitations in order to improve overall functional mobility and facilitate independence with BADL participation. DC plan remains appropriate, will follow acutely per POC.      Recommendations for follow up therapy are one component of a multi-disciplinary discharge planning process, led by the attending physician.  Recommendations may be updated based on patient status, additional functional criteria and insurance authorization.    Follow Up Recommendations  Home health OT    Assistance Recommended at Discharge Intermittent Supervision/Assistance  Patient can return home with the following  A little help with bathing/dressing/bathroom;Assistance with cooking/housework;Assist for transportation   Equipment Recommendations  BSC/3in1    Recommendations for Other Services      Precautions / Restrictions  Precautions Precautions: None Precaution Comments: wound vac; Other Brace: scrotal sling for mobility Restrictions Weight Bearing Restrictions: Yes RLE Weight Bearing: Weight bearing as tolerated       Mobility Bed Mobility               General bed mobility comments: session conducted in bed    Transfers                         Balance                                           ADL either performed or assessed with clinical judgement   ADL                         Lower Body Dressing Details (indicate cue type and reason): reviewed AE and discussed compensatory methods for LB dressing d/t wound vacs       Toileting - Clothing Manipulation Details (indicate cue type and reason): pt reprots he no longer uses scrotal sling but able to verbalize technique for donning, also discussed various techniques for toileting as pt reports difficulty urinating d/t enlarged scrotum, issued pt male urinal to assist with ease/aim of urinating, wife also reports that she got an elevated BSC to assist with aim during urination       General ADL Comments: session focus on probelm solving ADL tasks such as toileting and LB dressing    Extremity/Trunk Assessment Upper Extremity Assessment Upper Extremity Assessment: Overall WFL for tasks assessed   Lower Extremity Assessment Lower Extremity Assessment: Defer to PT  evaluation   Cervical / Trunk Assessment Cervical / Trunk Assessment: Normal    Vision Baseline Vision/History: 1 Wears glasses Patient Visual Report: No change from baseline     Perception Perception Perception: Within Functional Limits   Praxis Praxis Praxis: Intact    Cognition Arousal/Alertness: Awake/alert Behavior During Therapy: WFL for tasks assessed/performed Overall Cognitive Status: Within Functional Limits for tasks assessed                                          Exercises      Shoulder  Instructions       General Comments wife present during session    Pertinent Vitals/ Pain       Pain Assessment Pain Assessment: Faces Faces Pain Scale: Hurts a little bit Pain Location: RLE Pain Descriptors / Indicators: Tightness, Sore Pain Intervention(s): Monitored during session  Home Living                                          Prior Functioning/Environment              Frequency  Min 2X/week        Progress Toward Goals  OT Goals(current goals can now be found in the care plan section)  Progress towards OT goals: Progressing toward goals  Acute Rehab OT Goals Patient Stated Goal: go home OT Goal Formulation: With patient Time For Goal Achievement: 10/15/21 Potential to Achieve Goals: Good  Plan Discharge plan remains appropriate;Frequency remains appropriate    Co-evaluation                 AM-PAC OT "6 Clicks" Daily Activity     Outcome Measure   Help from another person eating meals?: None Help from another person taking care of personal grooming?: None Help from another person toileting, which includes using toliet, bedpan, or urinal?: A Little Help from another person bathing (including washing, rinsing, drying)?: A Little Help from another person to put on and taking off regular upper body clothing?: None Help from another person to put on and taking off regular lower body clothing?: A Little 6 Click Score: 21    End of Session    OT Visit Diagnosis: Unsteadiness on feet (R26.81);Other abnormalities of gait and mobility (R26.89);Muscle weakness (generalized) (M62.81);Pain Pain - Right/Left: Right Pain - part of body: Leg   Activity Tolerance Patient tolerated treatment well   Patient Left in bed;with call bell/phone within reach;with family/visitor present   Nurse Communication Mobility status;Other (comment) (issued pt male urinal and ready to DC)        Time: 8119-1478 OT Time Calculation (min): 17  min  Charges: OT General Charges $OT Visit: 1 Visit OT Treatments $Self Care/Home Management : 8-22 mins  Harley Alto., COTA/L Acute Rehabilitation Services [phone]   Precious Haws 10/10/2021, 1:29 PM

## 2021-10-10 NOTE — Progress Notes (Signed)
[place] for Warfarin Indication: atrial fibrillation and mechanical MVR, PAD  Patient Measurements: Height: '5\' 8"'$  (172.7 cm) Weight: 88.5 kg (195 lb) IBW/kg (Calculated) : 68.4  Heparin Dosing Weight: 86 kg  Vital Signs: Temp: 98.7 F (37.1 C) (06/24 0728) Temp Source: Oral (06/24 0728) BP: 119/46 (06/24 0728) Pulse Rate: 76 (06/24 0728)  Labs: Recent Labs    10/01/21 0336 10/02/21 0028 10/03/21 0115  HGB 8.6* 9.1* 9.5*  HCT 29.4* 30.0* 30.9*  PLT 260 244 273  LABPROT 18.1* 18.2* 20.1*  INR 1.5* 1.5* 1.7*  HEPARINUNFRC 0.43 0.39 0.59    Estimated Creatinine Clearance: 82 mL/min (by C-G formula based on SCr of 0.97 mg/dL).   Assessment: 66 y.o. male on warfarin PTA for mechanical MVR/AVR, hx afib s/p R femoral to AK popliteal bypass with vein and subsequent hematoma evacuation 6/17. Heparin infusion discontinued per Dr. Donzetta Matters on 7/7. Pharmacy consulted to dose warfarin.   DDIs: Levaquin and metronidazole may increase INR    PTA warfarin regimen (per 6/8 anticoagulation note): 7 mg Mon/Wed/Fri 6.5 mg all other days     INR at goal at 2.7 today but trending down, Hgb  low but stable. Plts wnl.   Goal of Therapy:  INR 2.5-3.5 Monitor platelets by anticoagulation protocol: Yes   Plan:  Warfarin '7mg'$  tonight (as per home regimen)  Monitor CBC, s/sx of bleeding, and INR daily   Given significant drug interactions with levaquin and flagyl would continue home dose at discharge very cautiously and would recommend INR at the end of this week then weekly until off abx.   Erin Hearing PharmD., BCPS Clinical Pharmacist 10/10/2021 10:48 AM

## 2021-10-10 NOTE — Care Management Important Message (Signed)
Important Message  Patient Details  Name: [name] MRN: [id number] Date of Birth: 08/16/1955   Medicare Important Message Given:  Yes     Shelda Altes 10/10/2021, 11:49 AM

## 2021-10-10 NOTE — Progress Notes (Signed)
Patient and family given discharge instructions, medication list and follow up appointments. IV and tele were dcd. Will discharge home as ordered with home vac. Patient with prescriptions from [facility]. Transported to exit via wheelchair and nursing staff. Nicholson Starace, [employer]

## 2021-10-10 NOTE — Progress Notes (Addendum)
Progress Note    10/10/2021 8:18 AM 5 Days Post-Op  Subjective:  ready to go home  Afebrile   Vitals:   10/10/21 0416 10/10/21 0726  BP: 133/67 133/63  Pulse: 65 66  Resp: 17 15  Temp: 98 F (36.7 C) 98 F (36.7 C)  SpO2: 98% 100%    Physical Exam: General:  no distress Lungs:  non labored Incisions:  right groin and right thigh wounds with good granulation tissue; other incisions look good with staples in tact.    CBC    Component Value Date/Time   WBC 5.0 10/10/2021 0242   RBC 3.55 (L) 10/10/2021 0242   HGB 10.5 (L) 10/10/2021 0242   HCT 34.4 (L) 10/10/2021 0242   PLT 215 10/10/2021 0242   MCV 96.9 10/10/2021 0242   MCH 29.6 10/10/2021 0242   MCHC 30.5 10/10/2021 0242   RDW 16.3 (H) 10/10/2021 0242   LYMPHSABS 818 (L) 03/08/2021 1117   MONOABS 0.8 05/11/2019 1352   EOSABS 112 03/08/2021 1117   BASOSABS 33 03/08/2021 1117    BMET    Component Value Date/Time   NA 136 10/04/2021 0112   K 4.3 10/04/2021 0112   CL 104 10/04/2021 0112   CO2 27 10/04/2021 0112   GLUCOSE 132 (H) 10/04/2021 0112   BUN 16 10/04/2021 0112   CREATININE 0.88 10/06/2021 0035   CREATININE 0.90 03/08/2021 1117   CALCIUM 8.4 (L) 10/04/2021 0112   GFRNONAA >60 10/06/2021 0035   GFRNONAA 89 11/17/2019 1258   GFRAA 104 11/17/2019 1258    INR    Component Value Date/Time   INR 2.7 (H) 10/10/2021 0242     Intake/Output Summary (Last 24 hours) at 10/10/2021 0818 Last data filed at 10/10/2021 0537 Gross per 24 hour  Intake 400.3 ml  Output 955 ml  Net -554.7 ml   Specimen Description WOUND   Special Requests right groin   Gram Stain NO WBC SEEN  RARE GRAM POSITIVE COCCI IN PAIRS   Culture MODERATE PSEUDOMONAS AERUGINOSA  FEW ENTEROBACTER CLOACAE  MODERATE BACTEROIDES FRAGILIS  BETA LACTAMASE POSITIVE  Performed at [facility], [place] [address], [place], [place] [address]   Report Status 10/09/2021 FINAL   Organism ID, Bacteria PSEUDOMONAS AERUGINOSA   Organism  ID, Bacteria ENTEROBACTER CLOACAE   Resulting Agency [facility]     Susceptibility   Pseudomonas aeruginosa Enterobacter cloacae    MIC MIC    CEFAZOLIN   >=64 RESIST... Resistant    CEFEPIME 2 SENSITIVE  Sensitive <=0.12 SENS... Sensitive    CEFTAZIDIME 4 SENSITIVE  Sensitive <=1 SENSITIVE  Sensitive    CIPROFLOXACIN <=0.25 SENS... Sensitive <=0.25 SENS... Sensitive    GENTAMICIN <=1 SENSITIVE  Sensitive <=1 SENSITIVE  Sensitive    IMIPENEM 1 SENSITIVE  Sensitive <=0.25 SENS... Sensitive    PIP/TAZO 16 SENSITIVE  Sensitive <=4 SENSITIVE  Sensitive    TRIMETH/SULFA   <=20 SENSIT... Sensitive             Susceptibility Comments  Pseudomonas aeruginosa  MODERATE PSEUDOMONAS AERUGINOSA  Enterobacter cloacae  FEW ENTEROBACTER CLOACAE          Assessment/Plan:  66 y.o. male is s/p:  Right femoral to above knee bypass grafting with GSV, take back to OR for bleeding and washout of right groin and thigh wounds with vac placement    -dressing changed this morning and wounds with good granulation tissue -home today as long as vac is approved -DVT prophylaxis:  coumadin for  mechanical AVR/MVR.  INR today is 2.7 -most likely home on Levaquin or Cipro as cx grew pseudomonas and enterobacter.  May need flagyl.  Will discuss with pharmacy.      Leontine Locket, PA-C [facility] [phone] 10/10/2021 8:18 AM   I have interviewed and examined patient with PA and agree with assessment and plan above.   Anuhea Gassner C. Donzetta Matters, MD [facility] [place] Office: [phone] Pager: [phone]

## 2021-10-10 NOTE — Progress Notes (Signed)
Mobility Specialist: Progress Note   10/10/21 1030  Mobility  Activity Ambulated with assistance in hallway  Level of Assistance Contact guard assist, steadying assist  Assistive Device Front wheel walker  RLE Weight Bearing WBAT  Distance Ambulated (ft) 470 ft  Activity Response Tolerated well  $Mobility charge 1 Mobility   Pre-Mobility: 73 HR Post-Mobility: 72 HR  Received pt in bed having no complaints and agreeable to mobility. Pt was asymptomatic throughout ambulation and returned to room w/o fault. Left sitting EOB w/ call bell in reach and all needs met.  [facility] [name] Mobility Specialist Mobility Specialist [address]: [phone]

## 2021-10-11 ENCOUNTER — Other Ambulatory Visit: Payer: Self-pay | Admitting: *Deleted

## 2021-10-11 NOTE — Patient Outreach (Signed)
  Care Coordination [facility] Note Transition Care Management Follow-up Telephone Call Date of discharge and from where: 10/10/21 [facility] How have you been since you were released from the hospital? Patient stated he is doing and feeling good considering all that he has gone through. Any questions or concerns? No  Items Reviewed: Did the pt receive and understand the discharge instructions provided? Yes  Medications obtained and verified? Yes  Other? No  Any new allergies since your discharge? No  Dietary orders reviewed? Yes Do you have support at home? Yes   Home Care and Equipment/Supplies: Were home health services ordered? yes If so, what is the name of the agency? [facility]  Has the agency set up a time to come to the patient's home? Yes, came today 10/11/21 Were any new equipment or medical supplies ordered?  Yes: wound Vac What is the name of the medical supply agency? Wound vac applied at the hospital Were you able to get the supplies/equipment? yes Do you have any questions related to the use of the equipment or supplies? No  [facility] nurse will be changing dressing  Functional Questionnaire: (I = Independent and D = Dependent) ADLs: I and assist  Bathing/Dressing- assit with aide/sife  Meal Prep- D wife prepares  Eating- I  Maintaining continence- I  Transferring/Ambulation- assist wife   Managing Meds- I  Follow up appointments reviewed:  PCP Hospital f/u appt confirmed? No   Specialist Hospital f/u appt confirmed? Yes  [facility] Ve[facility] office are to call within 2 days to schedule an appointment. Wife states she will call them tomorrow to schedule an appointment if they do not hear from them today. Are transportation arrangements needed? No  If their condition worsens, is the pt aware to call PCP or go to the Emergency Dept.? Yes Was the patient provided with contact information for the PCP's office or ED? Yes Was to pt encouraged to call back with  questions or concerns? Yes  SDOH assessments and interventions completed:   Yes  Care Coordination Interventions Activated:  No Care Coordination Interventions:   N/A  Encounter Outcome:  Pt. Visit Completed  Emelia Loron RN, BSN [place] [phone] [email or web address]

## 2021-10-16 NOTE — Discharge Summary (Signed)
Discharge Summary     [name] March 17, 1956 65 y.o. male  [id number]  Admission Date: 09/14/2021  Discharge Date: 10/10/2021  Physician: Dr. Donzetta Matters  Admission Diagnosis: PVD (peripheral vascular disease) ([place]) [I73.9] PAD (peripheral artery disease) ([place]) [I73.9]  HPI:   This is a 66 y.o. 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  He has a very large refluxing right greater saphenous vein and also has occlusive SFA disease.  He now presents for bypass surgery.  Hospital Course:  The patient was admitted to the hospital and taken to the operating room on 09/14/2021 and underwent: 1.  Harvest right greater saphenous vein 2.  Right common femoral endarterectomy 3.  Right common femoral to above-knee popliteal artery bypass with nonreversed, ipsilateral, translocated greater saphenous vein    Findings: Common femoral artery actually had a thrill within it and was severely diseased in the mid segment but there was good backbleeding from the profunda and SFA.  After endarterectomy had very strong inflow and pulsatility.  The vein was very large throughout its course and was harvested to just below the knee.  Above-knee popliteal artery was somewhat thickened but free of flow-limiting stenosis.  After bypass there was very strong signal at the posterior tibial and anterior tibial at the ankle and this was graft dependent.  The pt tolerated the procedure well and was transported to the PACU in good condition.   By POD 1, pt's foot feeling much better.  He had a brisk right PT/DP doppler signal.  He was started on heparin gtt for his mechanical MVR/AVR.  After rounds that morning, pt became hypotensive and his thigh was swollen.  Despite reassuring hgb, there was concern for bleeding.    He was taken back to the OR on 09/15/2021 and underwent: 1) exploration of right femoral-popliteal bypass 2) angiogram of right lower extremity 3) application of wound  vac  OPERATIVE FINDINGS: large amount of clot and frank blood encountered upon re-opening incisions.  I inspected both anastomoses and found no active bleeding.  There was significant blood noted to the fascia and so I connected all the incisions with a 10 blade.  Open the fascia and inspected the more distal bypass segment.  No active bleeding was noted from the tunnel.  I was not able to identify a clear Doppler signal in his foot.  On table angiography was performed.  This confirmed no extravasation of contrast to the bypass and good flow into the foot through the bypass.  By the next morning, pt was feeling better. He continued to have brisk doppler flow right foot.  JP drain was maintained and heparin was restarted without bolus.  He was transferred to [facility].  He did have acute blood loss anemia and received PRBC's post op.   His post op course also consisted of restarting his coumadin to therapeutic levels.   His incisional vac was removed on 6/25.  Xeroform and Kerlix dressing changes daily.  His JP drain was discontinued.  He also underwent diuresis.   Urology was consulted for gross hematuria.  It was felt prostate source of bleeding from minor catheter trauma.  He was not having retention.  Finasteride was started.   His urine did clear.    On 7/5, incision with new opening and this was packed.  He was scheduled for washout in the operating room and placement of wound vac.    Findings: The right groin had significant foul-smelling hematoma that was  washed out and a wound VAC was placed.  There is also 1 area in the mid thigh that had draining hematoma which was also washed out and wound VAC placed there as well.  The bypass graft was deep within the wound bed appeared to be well protected although was palpable and a white sponge was used over top of this and black sponge above that.  He was placed on abx.  Wound cx grew rare GPC in pairs.  Final cx grew pseudomonas aeruginosa and  enterobacter cloacae.    He was discharged home with a vac and levaquin and flagyl.   He has a home testing kit and will test his INR on 10/12/2021 and report to coumadin clinic for any adjustments.  He and his wife know to keep a close eye on this over the next two weeks while he is on abx.    Component Value Date/Time   WBC 5.0 10/10/2021 0242   RBC 3.55 (L) 10/10/2021 0242   HGB 10.5 (L) 10/10/2021 0242   HCT 34.4 (L) 10/10/2021 0242   PLT 215 10/10/2021 0242   MCV 96.9 10/10/2021 0242   MCH 29.6 10/10/2021 0242   MCHC 30.5 10/10/2021 0242   RDW 16.3 (H) 10/10/2021 0242   LYMPHSABS 818 (L) 03/08/2021 1117   MONOABS 0.8 05/11/2019 1352   EOSABS 112 03/08/2021 1117   BASOSABS 33 03/08/2021 1117    BMET    Component Value Date/Time   NA 136 10/04/2021 0112   K 4.3 10/04/2021 0112   CL 104 10/04/2021 0112   CO2 27 10/04/2021 0112   GLUCOSE 132 (H) 10/04/2021 0112   BUN 16 10/04/2021 0112   CREATININE 0.88 10/06/2021 0035   CREATININE 0.90 03/08/2021 1117   CALCIUM 8.4 (L) 10/04/2021 0112   GFRNONAA >60 10/06/2021 0035   GFRNONAA 89 11/17/2019 1258   GFRAA 104 11/17/2019 1258     Discharge Instructions     Discharge patient   Complete by: As directed    Discharge once HH and vac has been arranged.  Please let pt know to bring vac supplies to office when he comes in for follow up.  Thanks   Discharge disposition: 01-Home or Self Care   Discharge patient date: 10/10/2021       Discharge Diagnosis:  PVD (peripheral vascular disease) ([place]) [I73.9] PAD (peripheral artery disease) ([place]) [I73.9]  Secondary Diagnosis: Patient Active Problem List   Diagnosis Date Noted   PAD (peripheral artery disease) ([place]) 09/14/2021   Atrial flutter with rapid ventricular response ([place]) 06/05/2021   Long term (current) use of anticoagulants 03/08/2021   Ischemic heart disease 03/07/2020   Status post coronary artery stent placement 03/07/2020   S/P laparoscopic appendectomy  05/17/2019   Appendicolith 03/20/2019   Tobacco abuse 03/20/2019   Chronic atrial fibrillation ([place]) 03/20/2019   Mitral insufficiency and aortic stenosis    COPD (chronic obstructive pulmonary disease) ([place])    Hyperlipidemia    Acute appendicitis with perforation, appendicolith, and abscess    PVD (peripheral vascular disease) ([place]) 02/24/2018   Status post aortic valve and mitral valve replacement 02/24/2018   History of aortic valve replacement 02/24/2018   Paroxysmal atrial fibrillation ([place]) 02/24/2018   Essential hypertension 02/24/2018   CAD s/p inferior STEMI s/p DES to RCA in 10/2017  02/17/2018   Warfarin anticoagulation 02/10/2017   Past Medical History:  Diagnosis Date   Anginal pain ([place])    Cancer ([place])    skin   COPD (chronic  obstructive pulmonary disease) ([facility])    Coronary artery disease    Dyspnea    Dysrhythmia    afib, s/p LAA ligation/MAZE 2013; recurrent aflutter 04/2021   History of hiatal hernia    medication related in 20s   Hx of artificial heart valve replacement    Hypertension    Mitral insufficiency and aortic stenosis    mechanical AVR/MVR 2013 in [place]   Myocardial infarction [facility])    Pneumonia    as a child   PVD (peripheral vascular disease) ([place])    Rheumatic heart disease    Stroke ([place])      Allergies as of 10/10/2021   Not on File      Medication List     TAKE these medications    aspirin EC 81 MG tablet Take 81 mg by mouth daily. Swallow whole.   atorvastatin 80 MG tablet Commonly known as: LIPITOR Take 80 mg by mouth every evening.   carvedilol 3.125 MG tablet Commonly known as: COREG Take 6.25 mg by mouth 2 (two) times daily with a meal.   enalapril 5 MG tablet Commonly known as: VASOTEC Take 5 mg by mouth daily.   enoxaparin 100 MG/ML injection Commonly known as: LOVENOX Inject 90 mg into the skin every 12 (twelve) hours.   finasteride 5 MG tablet Commonly known as: Proscar Take 1 tablet (5 mg total) by mouth  daily. For enlarged prostate.   levofloxacin 750 MG tablet Commonly known as: Levaquin Take 1 tablet (750 mg total) by mouth daily for 14 days.   metroNIDAZOLE 500 MG tablet Commonly known as: Flagyl Take 1 tablet (500 mg total) by mouth 3 (three) times daily for 14 days.   oxyCODONE-acetaminophen 5-325 MG tablet Commonly known as: Percocet Take 1 tablet by mouth every 6 (six) hours as needed.   silver sulfADIAZINE 1 % cream Commonly known as: Silvadene Apply 1 application. topically daily. What changed:  when to take this reasons to take this   warfarin 1 MG tablet Commonly known as: COUMADIN Take 0.5-1 mg by mouth See admin instructions. Take 1 mg with 6 mg to equal 7 mg every Mon, Wed, and Fri  Take 0.5 mg with the 6 mg to equal 6.5 mg every Sun, Tues, Thurs, and Sat Notes to patient: Take as directed    warfarin 6 MG tablet Commonly known as: COUMADIN Take 6 mg by mouth daily.        Discharge Instructions: [facility] Discharge instructions Lower Extremity Bypass Surgery  Please refer to the following instruction for your post-procedure care. Your surgeon or physician assistant will discuss any changes with you.  Activity  You are encouraged to walk as much as you can. You can slowly return to normal activities during the month after your surgery. Avoid strenuous activity and heavy lifting until your doctor tells you it's OK. Avoid activities such as vacuuming or swinging a golf club. Do not drive until your doctor give the OK and you are no longer taking prescription pain medications. It is also normal to have difficulty with sleep habits, eating and bowel movement after surgery. These will go away with time.  Bathing/Showering  You may shower after you go home. Do not soak in a bathtub, hot tub, or swim until the incision heals completely.  Incision Care  Clean your incision with mild soap and water. Shower every day. Pat the  area dry with a clean towel. You do not need a  bandage unless otherwise instructed. Do not apply any ointments or creams to your incision. If you have open wounds you will be instructed how to care for them or a visiting nurse may be arranged for you. If you have staples or sutures along your incision they will be removed at your post-op appointment. You may have skin glue on your incision. Do not peel it off. It will come off on its own in about one week.  Wash the groin wound with soap and water daily and pat dry. (No tub bath-only shower)  Then put a dry gauze or washcloth in the groin to keep this area dry to help prevent wound infection.  Do this daily and as needed.  Do not use Vaseline or neosporin on your incisions.  Only use soap and water on your incisions and then protect and keep dry.  Diet  Resume your normal diet. There are no special food restrictions following this procedure. A low fat/ low cholesterol diet is recommended for all patients with vascular disease. In order to heal from your surgery, it is CRITICAL to get adequate nutrition. Your body requires vitamins, minerals, and protein. Vegetables are the best source of vitamins and minerals. Vegetables also provide the perfect balance of protein. Processed food has little nutritional value, so try to avoid this.  Medications  Resume taking all your medications unless your doctor or Physician Assistant tells you not to. If your incision is causing pain, you may take over-the-counter pain relievers such as acetaminophen (Tylenol). If you were prescribed a stronger pain medication, please aware these medication can cause nausea and constipation. Prevent nausea by taking the medication with a snack or meal. Avoid constipation by drinking plenty of fluids and eating foods with high amount of fiber, such as fruits, vegetables, and grains. Take Colace 100 mg (an over-the-counter stool softener) twice a day as needed for constipation.  Do not  take Tylenol if you are taking prescription pain medications.  Follow Up  Our office will schedule a follow up appointment 2-3 weeks following discharge.  Please call us immediately for any of the following conditions  Severe or worsening pain in your legs or feet while at rest or while walking Increase pain, redness, warmth, or drainage (pus) from your incision site(s) Fever of 101 degree or higher The swelling in your leg with the bypass suddenly worsens and becomes more painful than when you were in the hospital If you have been instructed to feel your graft pulse then you should do so every day. If you can no longer feel this pulse, call the office immediately. Not all patients are given this instruction.  Leg swelling is common after leg bypass surgery.  The swelling should improve over a few months following surgery. To improve the swelling, you may elevate your legs above the level of your heart while you are sitting or resting. Your surgeon or physician assistant may ask you to apply an ACE wrap or wear compression (TED) stockings to help to reduce swelling.  Reduce your risk of vascular disease  Stop smoking. If you would like help call [facility] at 1-800-QUIT-NOW 281-826-1957) or [place] at 3430270441.  Manage your cholesterol Maintain a desired weight Control your diabetes weight Control your diabetes Keep your blood pressure down  If you have any questions, please call the office at [phone]   Prescriptions given: 1.  Roxicet #28 No Refill 2.  Levaquin 762m daily #28 no refill 3.  Flagyl 500 mg tid #  42 no refill  Disposition: home  Patient's condition: is Good  Follow up: 1. [facility] in 2-3 weeks   Leontine Locket, PA-C [facility] [phone] 10/16/2021  7:29 AM  - For VQI Registry use ---   Post-op:  Wound infection: Yes  Graft infection: No  Transfusion: Yes    If yes, 8 units given New Arrhythmia: No Ipsilateral  amputation: No, [ ]  Minor, [ ]  BKA, [ ]  AKA Discharge patency: [x ] Primary, [ ]  Primary assisted, [ ]  Secondary, [ ]  Occluded Patency judged by: [x ] Dopper only, [ ]  Palpable graft pulse, []  Palpable distal pulse, [ ]  ABI inc. > 0.15, [ ]  Duplex Discharge ABI: R not done, L  D/C Ambulatory Status: Ambulatory  Complications: MI: No, [ ]  Troponin only, [ ]  EKG or Clinical CHF: No Resp failure:No, [ ]  Pneumonia, [ ]  Ventilator Chg in renal function: No, [ ]  Inc. Cr > 0.5, [ ]  Temp. Dialysis,  [ ]  Permanent dialysis Stroke: No, [ ]  Minor, [ ]  Major Return to OR: Yes  Reason for return to OR: [x ] Bleeding, [ ]  Infection, [ ]  Thrombosis, [ ]  Revision  Discharge medications: Statin use:  yes ASA use:  yes Plavix use:  no Beta blocker use: yes CCB use:  No ACEI use:   no ARB use:  no Coumadin use: yes

## 2021-10-17 ENCOUNTER — Telehealth: Payer: Self-pay

## 2021-10-17 NOTE — Telephone Encounter (Signed)
Kim with [facility] called stating that the pt had non-pitting BLE edema (approx 2-4 cm), with the R leg being worse than the L. He still has the wound vac to the R leg and HH is managing care for that. He is taking daily weights and is no longer taking Lasix. She asked for advice on whether restarting Lasix or using compression wraps would be of benefit to the pt.  Reviewed pt's chart, returned call, no answer, lf secure vm. Informed her that PA stated to have pt walk as much as possible and elevate when resting, without too much bend in the groin. No compression wraps should be used to prevent the bypass from going down.

## 2021-10-22 NOTE — Progress Notes (Unsigned)
  POST OPERATIVE OFFICE NOTE    CC:  F/u for surgery  HPI:  This is a 66 y.o. male who is s/p right CFA endarterectomy and right femoral to above knee bypass with GSV non reversed on 6/16/202 by Dr. Donzetta Matters.  He was taken back to the OR on 09/15/2021 for exploration for bleeding.    On 7/5, he was taken back to the OR for washout.  He was placed on abx and cultures revealed pseudomonas aeruginosa and enterobacter cloacae.    He was discharged home with a vac and levaquin and flagyl.   He is on coumadin for mechanical heart valves and he has a home testing kit and will test his INR on 10/12/2021 and report to coumadin clinic for any adjustments.  He and his wife know to keep a close eye on this over the next two weeks while he is on abx.  Pt returns today for follow up.  Pt states ***   Not on File  Current Outpatient Medications  Medication Sig Dispense Refill   aspirin EC 81 MG tablet Take 81 mg by mouth daily. Swallow whole.     atorvastatin (LIPITOR) 80 MG tablet Take 80 mg by mouth every evening.      carvedilol (COREG) 3.125 MG tablet Take 6.25 mg by mouth 2 (two) times daily with a meal.     enalapril (VASOTEC) 5 MG tablet Take 5 mg by mouth daily.     enoxaparin (LOVENOX) 100 MG/ML injection Inject 90 mg into the skin every 12 (twelve) hours.     finasteride (PROSCAR) 5 MG tablet Take 1 tablet (5 mg total) by mouth daily. For enlarged prostate. 90 tablet 3   levofloxacin (LEVAQUIN) 750 MG tablet Take 1 tablet (750 mg total) by mouth daily for 14 days. 14 tablet 0   metroNIDAZOLE (FLAGYL) 500 MG tablet Take 1 tablet (500 mg total) by mouth 3 (three) times daily for 14 days. 42 tablet 0   oxyCODONE-acetaminophen (PERCOCET) 5-325 MG tablet Take 1 tablet by mouth every 6 (six) hours as needed. 28 tablet 0   silver sulfADIAZINE (SILVADENE) 1 % cream Apply 1 application. topically daily. (Patient taking differently: Apply 1 application  topically daily as needed (wound care).) 50 g 0    warfarin (COUMADIN) 1 MG tablet Take 0.5-1 mg by mouth See admin instructions. Take 1 mg with 6 mg to equal 7 mg every Mon, Wed, and Fri  Take 0.5 mg with the 6 mg to equal 6.5 mg every Sun, Tues, Thurs, and Sat     warfarin (COUMADIN) 6 MG tablet Take 6 mg by mouth daily.     No current facility-administered medications for this visit.     ROS:  See HPI  Physical Exam:  ***  Incision:  *** Extremities:  *** Neuro: *** Abdomen:  ***    Assessment/Plan:  This is a 66 y.o. male who is s/p: right CFA endarterectomy and right femoral to above knee bypass with GSV non reversed on 6/16/202 by Dr. Donzetta Matters.  He was taken back to the OR on 09/15/2021 for exploration for bleeding.    On 7/5, he was taken back to the OR for washout.  He was placed on abx and cultures revealed pseudomonas aeruginosa and enterobacter cloacae.    He was discharged home with a vac and levaquin and flagyl.   -***   Leontine Locket, [facility] [phone]   Clinic MD:  Scot Dock

## 2021-10-24 ENCOUNTER — Encounter: Payer: Self-pay | Admitting: Physician Assistant

## 2021-10-24 ENCOUNTER — Ambulatory Visit (INDEPENDENT_AMBULATORY_CARE_PROVIDER_SITE_OTHER): Payer: Medicare Other | Admitting: Physician Assistant

## 2021-10-24 VITALS — BP 112/67 | HR 89 | Temp 98.0°F | Resp 20 | Ht 68.0 in | Wt 196.9 lb

## 2021-10-24 DIAGNOSIS — I739 Peripheral vascular disease, unspecified: Secondary | ICD-10-CM

## 2021-10-24 DIAGNOSIS — M7989 Other specified soft tissue disorders: Secondary | ICD-10-CM

## 2021-10-24 NOTE — Progress Notes (Unsigned)
POST OPERATIVE OFFICE NOTE    CC:  F/u for surgery  HPI:  This is a 66 y.o. male who is s/p right CFA endarterectomy and right femoral to above knee bypass with GSV non reversed on 6/16/202 by Dr. Donzetta Matters.  He was taken back to the OR on 09/15/2021 for exploration for bleeding.    On 7/5, he was taken back to the OR for washout.  He was placed on abx and cultures revealed pseudomonas aeruginosa and enterobacter cloacae.    He was discharged home with a vac and levaquin and flagyl.    He is on coumadin for mechanical heart valves and he has a home testing kit and will test his INR on 10/12/2021 and report to coumadin clinic for any adjustments.  He and his wife know to keep a close eye on this over the next two weeks while he is on abx.  Pt was seen on 10/24/2021 and his vac was removed.  He had good granulation tissue.  His skin was macerated so wet to dry dressings were initiated for 2 weeks until this follow up visit.  His leg was also wrapped with compression wrap from his toes to upper thigh to help with the swelling.    Pt returns today for follow up.  Pt states ***    Current Outpatient Medications  Medication Sig Dispense Refill   aspirin EC 81 MG tablet Take 81 mg by mouth daily. Swallow whole.     atorvastatin (LIPITOR) 80 MG tablet Take 80 mg by mouth every evening.      carvedilol (COREG) 3.125 MG tablet Take 6.25 mg by mouth 2 (two) times daily with a meal.     enalapril (VASOTEC) 5 MG tablet Take 5 mg by mouth daily.     enoxaparin (LOVENOX) 100 MG/ML injection Inject 90 mg into the skin every 12 (twelve) hours. (Patient not taking: Reported on 10/24/2021)     finasteride (PROSCAR) 5 MG tablet Take 1 tablet (5 mg total) by mouth daily. For enlarged prostate. 90 tablet 3   levofloxacin (LEVAQUIN) 750 MG tablet Take 1 tablet (750 mg total) by mouth daily for 14 days. 14 tablet 0   metroNIDAZOLE (FLAGYL) 500 MG tablet Take 1 tablet (500 mg total) by mouth 3 (three) times daily for 14  days. 42 tablet 0   oxyCODONE-acetaminophen (PERCOCET) 5-325 MG tablet Take 1 tablet by mouth every 6 (six) hours as needed. 28 tablet 0   silver sulfADIAZINE (SILVADENE) 1 % cream Apply 1 application. topically daily. (Patient taking differently: Apply 1 application  topically daily as needed (wound care).) 50 g 0   warfarin (COUMADIN) 1 MG tablet Take 0.5-1 mg by mouth See admin instructions. Take 1 mg with 6 mg to equal 7 mg every Mon, Wed, and Fri  Take 0.5 mg with the 6 mg to equal 6.5 mg every Sun, Tues, Thurs, and Sat     warfarin (COUMADIN) 6 MG tablet Take 6 mg by mouth daily.     No current facility-administered medications for this visit.     ROS:  See HPI  Physical Exam:  ***  Incision:  *** Extremities:  *** Neuro: *** Abdomen:  ***    Assessment/Plan:  This is a 66 y.o. male who is s/p: ight CFA endarterectomy and right femoral to above knee bypass with GSV non reversed on 6/16/202 by Dr. Donzetta Matters.  He was taken back to the OR on 09/15/2021 for exploration for bleeding.  On 7/5, he was taken back to the OR for washout.  He was placed on abx and cultures revealed pseudomonas aeruginosa and enterobacter cloacae.    He was discharged home with a vac and levaquin and flagyl.   -***   Leontine Locket, [facility] MD:  Donzetta Matters

## 2021-10-29 ENCOUNTER — Telehealth: Payer: Self-pay

## 2021-10-29 NOTE — Telephone Encounter (Signed)
Kim with [facility] called stating that the pt's wound is healed, but the pt is demanding that the wound vac be put back on. She was requesting the ok to replace wound vac.  Reviewed pt's chart, returned call for clarification on which wound and how the area appears, no answer, lf vm.  Spoke to Dr Stanford Breed, Maudie Mercury called stating all the maceration is gone and there is no drainage. She spoke of implementing some aids to help prevent skin breakdown.  Returned call, informed her that Dr Stanford Breed said if there was no more maceration, the wound vac could be placed back on. Encouraged her to use implements to help with skin breakdown.

## 2021-11-05 ENCOUNTER — Ambulatory Visit (INDEPENDENT_AMBULATORY_CARE_PROVIDER_SITE_OTHER): Payer: Medicare Other | Admitting: Family Medicine

## 2021-11-05 VITALS — BP 120/70 | HR 88 | Temp 98.3°F | Ht 68.0 in | Wt 200.0 lb

## 2021-11-05 DIAGNOSIS — I480 Paroxysmal atrial fibrillation: Secondary | ICD-10-CM

## 2021-11-05 DIAGNOSIS — Z952 Presence of prosthetic heart valve: Secondary | ICD-10-CM | POA: Diagnosis not present

## 2021-11-05 DIAGNOSIS — I739 Peripheral vascular disease, unspecified: Secondary | ICD-10-CM | POA: Diagnosis not present

## 2021-11-05 DIAGNOSIS — Z720 Tobacco use: Secondary | ICD-10-CM

## 2021-11-05 DIAGNOSIS — R972 Elevated prostate specific antigen [PSA]: Secondary | ICD-10-CM

## 2021-11-05 DIAGNOSIS — Z955 Presence of coronary angioplasty implant and graft: Secondary | ICD-10-CM

## 2021-11-05 MED ORDER — LEVOFLOXACIN 500 MG PO TABS: 500.0000 mg | ORAL_TABLET | Freq: Every day | ORAL | 0 refills | Status: AC

## 2021-11-05 MED ORDER — FUROSEMIDE 40 MG PO TABS: 40.0000 mg | ORAL_TABLET | Freq: Every day | ORAL | 3 refills | Status: DC

## 2021-11-05 NOTE — Progress Notes (Signed)
Subjective:    Patient ID: [name], male    DOB: 03/02/1956, 66 y.o.   MRN: [id number]  HPI Admission Date: 09/14/2021   Discharge Date: 10/10/2021   Physician: Dr. Donzetta Matters   Admission Diagnosis: PVD (peripheral vascular disease) ([facility]) [I73.9] PAD (peripheral artery disease) ([place]) [I73.9]   HPI:   This is a 66 y.o. 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  He has a very large refluxing right greater saphenous vein and also has occlusive SFA disease.  He now presents for bypass surgery.   Hospital Course:  The patient was admitted to the hospital and taken to the operating room on 09/14/2021 and underwent: 1.  Harvest right greater saphenous vein 2.  Right common femoral endarterectomy 3.  Right common femoral to above-knee popliteal artery bypass with nonreversed, ipsilateral, translocated greater saphenous vein     Findings: Common femoral artery actually had a thrill within it and was severely diseased in the mid segment but there was good backbleeding from the profunda and SFA.  After endarterectomy had very strong inflow and pulsatility.  The vein was very large throughout its course and was harvested to just below the knee.  Above-knee popliteal artery was somewhat thickened but free of flow-limiting stenosis.  After bypass there was very strong signal at the posterior tibial and anterior tibial at the ankle and this was graft dependent.   The pt tolerated the procedure well and was transported to the PACU in good condition.    By POD 1, pt's foot feeling much better.  He had a brisk right PT/DP doppler signal.  He was started on heparin gtt for his mechanical MVR/AVR.  After rounds that morning, pt became hypotensive and his thigh was swollen.  Despite reassuring hgb, there was concern for bleeding.     He was taken back to the OR on 09/15/2021 and underwent: 1) exploration of right femoral-popliteal bypass 2) angiogram of right lower  extremity 3) application of wound vac   OPERATIVE FINDINGS: large amount of clot and frank blood encountered upon re-opening incisions.  I inspected both anastomoses and found no active bleeding.  There was significant blood noted to the fascia and so I connected all the incisions with a 10 blade.  Open the fascia and inspected the more distal bypass segment.  No active bleeding was noted from the tunnel.  I was not able to identify a clear Doppler signal in his foot.  On table angiography was performed.  This confirmed no extravasation of contrast to the bypass and good flow into the foot through the bypass.   By the next morning, pt was feeling better. He continued to have brisk doppler flow right foot.  JP drain was maintained and heparin was restarted without bolus.  He was transferred to [place].   He did have acute blood loss anemia and received PRBC's post op.    His post op course also consisted of restarting his coumadin to therapeutic levels.    His incisional vac was removed on 6/25.  Xeroform and Kerlix dressing changes daily.  His JP drain was discontinued.  He also underwent diuresis.    Urology was consulted for gross hematuria.  It was felt prostate source of bleeding from minor catheter trauma.  He was not having retention.  Finasteride was started.   His urine did clear.     On 7/5, incision with new opening and this was packed.  He was scheduled  for washout in the operating room and placement of wound vac.     Findings: The right groin had significant foul-smelling hematoma that was washed out and a wound VAC was placed.  There is also 1 area in the mid thigh that had draining hematoma which was also washed out and wound VAC placed there as well.  The bypass graft was deep within the wound bed appeared to be well protected although was palpable and a white sponge was used over top of this and black sponge above that.   He was placed on abx.  Wound cx grew rare GPC in pairs.   Final cx grew pseudomonas aeruginosa and enterobacter cloacae.     He was discharged home with a vac and levaquin and flagyl.    He has a home testing kit and will test his INR on 10/12/2021 and report to coumadin clinic for any adjustments.  He and his wife know to keep a close eye on this over the next two weeks while he is on abx.   Secondary Diagnosis:     Patient Active Problem List    Diagnosis Date Noted   PAD (peripheral artery disease) ([place]) 09/14/2021   Atrial flutter with rapid ventricular response ([place]) 06/05/2021   Long term (current) use of anticoagulants 03/08/2021   Ischemic heart disease 03/07/2020   Status post coronary artery stent placement 03/07/2020   S/P laparoscopic appendectomy 05/17/2019   Appendicolith 03/20/2019   Tobacco abuse 03/20/2019   Chronic atrial fibrillation ([place]) 03/20/2019   Mitral insufficiency and aortic stenosis     COPD (chronic obstructive pulmonary disease) ([place])     Hyperlipidemia     Acute appendicitis with perforation, appendicolith, and abscess     PVD (peripheral vascular disease) ([place]) 02/24/2018   Status post aortic valve and mitral valve replacement 02/24/2018   History of aortic valve replacement 02/24/2018   Paroxysmal atrial fibrillation ([place]) 02/24/2018   Essential hypertension 02/24/2018   CAD s/p inferior STEMI s/p DES to RCA in 10/2017  02/17/2018   Warfarin anticoagulation 02/10/2017    11/05/21 Patient is here today for follow-up.  First I am extremely proud of the patient, he has quit smoking!.  However his scrotum is extremely swollen today.  He has pitting edema in his right leg and swelling in his right leg.  He is already anticoagulated on Coumadin so I do not feel that there is any evidence of a blood clot however I do believe that the patient has significant third space edema.  He was previously taking Lasix while in the hospital.  He has not been taking any diuretic and he still is sitting the majority of the day so I  believe a lot of this is third space edema.  He denies any dysuria.  He denies any cough or chest pain or shortness of breath.  He has a wound VAC over his medial right thigh.  Surrounding the adhesive of the wound VAC, the skin is slightly erythematous and warm and indurated.  The patient states has been like that for weeks.  He believes it is due to the adhesive.  He states the nurses have been checking it daily and have not been concerned by.  He denies any pain upon palpation however the skin there is warm.  I do not have a frame of reference to compare.  This could represent early cellulitis however the patient states its been stable like this since discharge from the hospital.  It certainly  could be irritation due to the tape and adhesive.  Past Medical History:  Diagnosis Date   Anginal pain ([place])    Cancer ([place])    skin   COPD (chronic obstructive pulmonary disease) ([facility])    Coronary artery disease    Dyspnea    Dysrhythmia    afib, s/p LAA ligation/MAZE 2013; recurrent aflutter 04/2021   History of hiatal hernia    medication related in 20s   Hx of artificial heart valve replacement    Hypertension    Mitral insufficiency and aortic stenosis    mechanical AVR/MVR 2013 in [place]   Myocardial infarction ([place])    Pneumonia    as a child   PVD (peripheral vascular disease) ([place])    Rheumatic heart disease    Stroke [facility])    Past Surgical History:  Procedure Laterality Date   ABDOMINAL AORTOGRAM W/LOWER EXTREMITY Bilateral 04/16/2021   Procedure: ABDOMINAL AORTOGRAM W/LOWER EXTREMITY;  Surgeon: Waynetta Sandy, MD;  Location: [place] [facility];  Service: Cardiovascular;  Laterality: Bilateral;   AORTIC AND MITRAL VALVE REPLACEMENT  2013   mechanical AVR/MVR in [place] Right 09/15/2021   Procedure: APPLICATION OF WOUND VAC RIGTH LEG;  Surgeon: Cherre Robins, MD;  Location: [facility] OR;  Service: Vascular;  Laterality: Right;   APPLICATION  OF WOUND VAC  10/05/2021   Procedure: APPLICATION OF RIGHT LEG WOUND VAC;  Surgeon: Waynetta Sandy, MD;  Location: [facility] OR;  Service: Vascular;;   BIOPSY  04/20/2020   Procedure: BIOPSY;  Surgeon: [name], MD;  [facility];  Service: Gastroenterology;;   CARDIAC VALVE REPLACEMENT     COLONOSCOPY WITH PROPOFOL N/A 04/20/2020   Procedure: COLONOSCOPY WITH PROPOFOL;  Surgeon: [name], MD;  [facility];  Service: Gastroenterology;  Laterality: N/A;   CORONARY ANGIOPLASTY WITH STENT PLACEMENT  10/2017   FEMORAL ARTERY EXPLORATION Right 09/15/2021   Procedure: FEMORAL ARTERY RE EXPLORATION;  Surgeon: Cherre Robins, MD;  Location: [place];  Service: Vascular;  Laterality: Right;   FEMORAL-POPLITEAL BYPASS GRAFT Right 09/14/2021   Procedure: RIGHT COMMON FEMORAL-ABOVE KNEE POPLITEAL ARTERY BYPASS WITH VEIN;  Surgeon: Waynetta Sandy, MD;  Location: [place];  Service: Vascular;  Laterality: Right;   INCISION AND DRAINAGE OF WOUND Right 10/05/2021   Procedure: RIGHT Karl Ito;  Surgeon: Waynetta Sandy, MD;  Location: [facility];  Service: Vascular;  Laterality: Right;   LAPAROSCOPIC APPENDECTOMY N/A 05/17/2019   Procedure: INTERVAL LAPAROSCOPIC APPENDECTOMY;  Surgeon: Greer Pickerel, MD;  [facility];  Service: General;  Laterality: N/A;   LOWER EXTREMITY ANGIOGRAM Right 09/15/2021   Procedure: RIGHT LOWER EXTREMITY ANGIOGRAM;  Surgeon: Cherre Robins, MD;  Location: [place];  Service: Vascular;  Laterality: Right;   MAZE  2013   atrial appendage ligation and maze procedure in 2013   POLYPECTOMY  04/20/2020   Procedure: POLYPECTOMY;  Surgeon: [name], MD;  [facility];  Service: Gastroenterology;;   Current Outpatient Medications on File Prior to Visit  Medication Sig Dispense Refill   aspirin EC 81 MG tablet Take 81 mg by mouth daily. Swallow whole.     atorvastatin (LIPITOR) 80 MG tablet Take 80 mg by mouth every evening.       carvedilol (COREG) 3.125 MG tablet Take 6.25 mg by mouth 2 (two) times daily with a meal.     enalapril (VASOTEC) 5 MG tablet Take 5 mg by  mouth daily.     enoxaparin (LOVENOX) 100 MG/ML injection Inject 90 mg into the skin every 12 (twelve) hours. (Patient not taking: Reported on 10/24/2021)     finasteride (PROSCAR) 5 MG tablet Take 1 tablet (5 mg total) by mouth daily. For enlarged prostate. 90 tablet 3   oxyCODONE-acetaminophen (PERCOCET) 5-325 MG tablet Take 1 tablet by mouth every 6 (six) hours as needed. 28 tablet 0   silver sulfADIAZINE (SILVADENE) 1 % cream Apply 1 application. topically daily. (Patient taking differently: Apply 1 application  topically daily as needed (wound care).) 50 g 0   warfarin (COUMADIN) 1 MG tablet Take 0.5-1 mg by mouth See admin instructions. Take 1 mg with 6 mg to equal 7 mg every Mon, Wed, and Fri  Take 0.5 mg with the 6 mg to equal 6.5 mg every Sun, Tues, Thurs, and Sat     warfarin (COUMADIN) 6 MG tablet Take 6 mg by mouth daily.     No current facility-administered medications on file prior to visit.   Not on File Social History   Socioeconomic History   Marital status: Married    Spouse name: Not on file   Number of children: Not on file   Years of education: Not on file   Highest education level: Not on file  Occupational History   Not on file  Tobacco Use   Smoking status: Every Day    Years: 15.00    Types: Cigarettes    Passive exposure: Never   Smokeless tobacco: Never  Vaping Use   Vaping Use: Never used  Substance and Sexual Activity   Alcohol use: Yes    Comment: ocassional   Drug use: Never   Sexual activity: Not on file  Other Topics Concern   Not on file  Social History Narrative   Not on file   Social Determinants of Health   Financial Resource Strain: Not on file  Food Insecurity: Not on file  Transportation Needs: Not on file  Physical Activity: Not on file  Stress: Not on file  Social Connections: Not on file   Intimate Partner Violence: Not on file    Review of Systems  All other systems reviewed and are negative.      Objective:   Physical Exam Constitutional:      General: He is not in acute distress.    Appearance: Normal appearance. He is normal weight. He is not ill-appearing or toxic-appearing.  Cardiovascular:     Rate and Rhythm: Normal rate and regular rhythm.     Heart sounds: Murmur heard.  Pulmonary:     Effort: Pulmonary effort is normal. No respiratory distress.     Breath sounds: Normal breath sounds. No stridor. No wheezing, rhonchi or rales.  Abdominal:     General: Abdomen is flat. Bowel sounds are normal. There is no distension.     Palpations: Abdomen is soft.     Tenderness: There is no abdominal tenderness. There is no guarding.  Musculoskeletal:     Right lower leg: Pitting Edema present.     Left lower leg: No edema.       Legs:  Neurological:     Mental Status: He is alert.       Assessment & Plan:  PVD (peripheral vascular disease) ([place]) - Plan: CBC with Differential/Platelet, COMPLETE METABOLIC PANEL WITH GFR  H/O mitral valve replacement with mechanical valve  Paroxysmal atrial fibrillation ([facility])  Tobacco abuse  Status post coronary artery stent placement  Elevated PSA - Plan: PSA Based on the clinical appearance, I am concerned about early cellulitis.  However I am reassured by the patient's description that this has been like this for quite some time.  I did call in an antibiotic that would cover the previous bacteria that he had in the infection which is Pseudomonas and Enterococcus.  I called out Levaquin.  This would obviously interfere with his Coumadin.  Therefore we decided not to start the antibiotic unless the erythema spreads, unless he develops a fever, unless he starts to experience pain in that area.  For the time being he will simply monitor the area clinically.  I would like to start the patient on Lasix 40 mg daily.  Patient has  significant third space edema in the scrotum.  I anticipate that he would benefit from taking the Lasix about 7 days.  I will recheck a CBC to monitor his blood counts.  If he is still anemic, I would suggest that he should take iron.  Check his electrolytes with a BMP.  I did congratulate the patient for quitting smoking.  I also jokingly threatened to strangle him if he started smoking again.

## 2021-11-06 LAB — COMPLETE METABOLIC PANEL WITH GFR
AG Ratio: 1.6 (calc) (ref 1.0–2.5)
ALT: 27 U/L (ref 9–46)
AST: 20 U/L (ref 10–35)
Albumin: 3.6 g/dL (ref 3.6–5.1)
Alkaline phosphatase (APISO): 95 U/L (ref 35–144)
BUN: 18 mg/dL (ref 7–25)
CO2: 27 mmol/L (ref 20–32)
Calcium: 8.9 mg/dL (ref 8.6–10.3)
Chloride: 100 mmol/L (ref 98–110)
Creat: 0.86 mg/dL (ref 0.70–1.35)
Globulin: 2.2 g/dL (calc) (ref 1.9–3.7)
Glucose, Bld: 120 mg/dL — ABNORMAL HIGH (ref 65–99)
Potassium: 4.3 mmol/L (ref 3.5–5.3)
Sodium: 137 mmol/L (ref 135–146)
Total Bilirubin: 0.8 mg/dL (ref 0.2–1.2)
Total Protein: 5.8 g/dL — ABNORMAL LOW (ref 6.1–8.1)
eGFR: 96 mL/min/{1.73_m2} (ref >=60)

## 2021-11-06 LAB — CBC WITH DIFFERENTIAL/PLATELET
Absolute Monocytes: 952 cells/uL — ABNORMAL HIGH (ref 200–950)
Basophils Absolute: 21 cells/uL (ref 0–200)
Basophils Relative: 0.2 %
Eosinophils Absolute: 21 cells/uL (ref 15–500)
Eosinophils Relative: 0.2 %
HCT: 38.8 % (ref 38.5–50.0)
Hemoglobin: 13 g/dL — ABNORMAL LOW (ref 13.2–17.1)
Lymphs Abs: 524 cells/uL — ABNORMAL LOW (ref 850–3900)
MCH: 30.3 pg (ref 27.0–33.0)
MCHC: 33.5 g/dL (ref 32.0–36.0)
MCV: 90.4 fL (ref 80.0–100.0)
MPV: 11.6 fL (ref 7.5–12.5)
Monocytes Relative: 8.9 %
Neutro Abs: 9181 cells/uL — ABNORMAL HIGH (ref 1500–7800)
Neutrophils Relative %: 85.8 %
Platelets: 164 10*3/uL (ref 140–400)
RBC: 4.29 10*6/uL (ref 4.20–5.80)
RDW: 14.4 % (ref 11.0–15.0)
Total Lymphocyte: 4.9 %
WBC: 10.7 10*3/uL (ref 3.8–10.8)

## 2021-11-06 LAB — PSA: PSA: 1.09 ng/mL (ref <=4.00)

## 2021-11-07 ENCOUNTER — Ambulatory Visit (INDEPENDENT_AMBULATORY_CARE_PROVIDER_SITE_OTHER): Payer: Medicare Other | Admitting: Physician Assistant

## 2021-11-07 ENCOUNTER — Encounter: Payer: Self-pay | Admitting: Physician Assistant

## 2021-11-07 VITALS — BP 123/69 | HR 71 | Temp 98.1°F | Resp 20 | Ht 68.0 in | Wt 196.7 lb

## 2021-11-07 DIAGNOSIS — I739 Peripheral vascular disease, unspecified: Secondary | ICD-10-CM

## 2021-11-21 ENCOUNTER — Ambulatory Visit: Payer: Medicare Other

## 2021-11-23 ENCOUNTER — Ambulatory Visit: Payer: Self-pay

## 2021-11-23 NOTE — Telephone Encounter (Signed)
     Chief Complaint: Right eye - white sclera is red. Symptoms: Above Frequency: Today Pertinent Negatives: Patient denies no pain, no vision changes, no swelling Disposition: '[]'$ ED /'[]'$ Urgent Care (no appt availability in office) / '[]'$ Appointment(In office/virtual)/ '[]'$  [facility]/ '[x]'$ Home Care/ '[]'$ Refused Recommended Disposition /'[]'$ [facility]/ '[]'$  Follow-up with PCP Additional Notes: Will monitor and go to UC with any changes.  Reason for Disposition  [1] Mild eye pain AND [2] present < 24 hours  Answer Assessment - Initial Assessment Questions 1. ONSET: "When did the pain start?" (e.g., minutes, hours, days)     Today 2. TIMING: "Does the pain come and go, or has it been constant since it started?" (e.g., constant, intermittent, fleeting)     Constant 3. SEVERITY: "How bad is the pain?"   (Scale 1-10; mild, moderate or severe)   - MILD (1-3): doesn't interfere with normal activities    - MODERATE (4-7): interferes with normal activities or awakens from sleep    - SEVERE (8-10): excruciating pain and patient unable to do normal activities     No 4. LOCATION: "Where does it hurt?"  (e.g., eyelid, eye, cheekbone)     Inside 5. CAUSE: "What do you think is causing the pain?"     Unsure 6. VISION: "Do you have blurred vision or changes in your vision?"      No changes 7. EYE DISCHARGE: "Is there any discharge (pus) from the eye(s)?"  If Yes, ask: "What color is it?"      No 8. FEVER: "Do you have a fever?" If Yes, ask: "What is it, how was it measured, and when did it start?"      No 9. OTHER SYMPTOMS: "Do you have any other symptoms?" (e.g., headache, nasal discharge, facial rash)     No 10. PREGNANCY: "Is there any chance you are pregnant?" "When was your last menstrual period?"       N/a  Protocols used: Eye Pain and Other Symptoms-A-AH

## 2021-11-27 ENCOUNTER — Ambulatory Visit (INDEPENDENT_AMBULATORY_CARE_PROVIDER_SITE_OTHER): Payer: Medicare Other | Admitting: Family Medicine

## 2021-11-27 VITALS — BP 118/72 | HR 72 | Temp 98.2°F | Ht 68.0 in | Wt 203.0 lb

## 2021-11-27 DIAGNOSIS — H1132 Conjunctival hemorrhage, left eye: Secondary | ICD-10-CM | POA: Diagnosis not present

## 2021-11-27 NOTE — Progress Notes (Signed)
Subjective:    Patient ID: [name], male    DOB: [birth year], 66 y.o.   MRN: [id number]  Eye Problem    Friday night, the patient noticed a ruptured capillaries on his right eye causing a subconjunctival hemorrhage.  By Saturday morning blood was covering the entire surface of the eye.  Since that time there has been no additional separated.  He denies any eye pain or blurry vision.  He is on Coumadin for mechanical valve replacement.  His INR was 3.3 this morning.  Goal INR is 2.5-3.5. Past Medical History:  Diagnosis Date   Anginal pain ([place])    Cancer ([place])    skin   COPD (chronic obstructive pulmonary disease) ([facility])    Coronary artery disease    Dyspnea    Dysrhythmia    afib, s/p LAA ligation/MAZE 2013; recurrent aflutter 04/2021   History of hiatal hernia    medication related in 20s   Hx of artificial heart valve replacement    Hypertension    Mitral insufficiency and aortic stenosis    mechanical AVR/MVR 2013 in [place]   Myocardial infarction ([place])    Pneumonia    as a child   PVD (peripheral vascular disease) ([place])    Rheumatic heart disease    Stroke [facility])    Past Surgical History:  Procedure Laterality Date   ABDOMINAL AORTOGRAM W/LOWER EXTREMITY Bilateral 04/16/2021   Procedure: ABDOMINAL AORTOGRAM W/LOWER EXTREMITY;  Surgeon: Waynetta Sandy, MD;  Location: [place] CV LAB;  Service: Cardiovascular;  Laterality: Bilateral;   AORTIC AND MITRAL VALVE REPLACEMENT  2013   mechanical AVR/MVR in [place] Right 09/15/2021   Procedure: APPLICATION OF WOUND VAC RIGTH LEG;  Surgeon: Cherre Robins, MD;  Location: [place] OR;  Service: Vascular;  Laterality: Right;   APPLICATION OF WOUND VAC  10/05/2021   Procedure: APPLICATION OF RIGHT LEG WOUND VAC;  Surgeon: Waynetta Sandy, MD;  Location: [facility];  Service: Vascular;;   BIOPSY  04/20/2020   Procedure: BIOPSY;  Surgeon: [name], MD;  [facility];   Service: Gastroenterology;;   CARDIAC VALVE REPLACEMENT     COLONOSCOPY WITH PROPOFOL N/A 04/20/2020   Procedure: COLONOSCOPY WITH PROPOFOL;  Surgeon: [name], MD;  [facility];  Service: Gastroenterology;  Laterality: N/A;   CORONARY ANGIOPLASTY WITH STENT PLACEMENT  10/2017   FEMORAL ARTERY EXPLORATION Right 09/15/2021   Procedure: FEMORAL ARTERY RE EXPLORATION;  Surgeon: Cherre Robins, MD;  Location: [place];  Service: Vascular;  Laterality: Right;   FEMORAL-POPLITEAL BYPASS GRAFT Right 09/14/2021   Procedure: RIGHT COMMON FEMORAL-ABOVE KNEE POPLITEAL ARTERY BYPASS WITH VEIN;  Surgeon: Waynetta Sandy, MD;  Location: [place];  Service: Vascular;  Laterality: Right;   INCISION AND DRAINAGE OF WOUND Right 10/05/2021   Procedure: RIGHT Karl Ito;  Surgeon: Waynetta Sandy, MD;  Location: [place];  Service: Vascular;  Laterality: Right;   LAPAROSCOPIC APPENDECTOMY N/A 05/17/2019   Procedure: INTERVAL LAPAROSCOPIC APPENDECTOMY;  Surgeon: Greer Pickerel, MD;  [facility];  Service: General;  Laterality: N/A;   LOWER EXTREMITY ANGIOGRAM Right 09/15/2021   Procedure: RIGHT LOWER EXTREMITY ANGIOGRAM;  Surgeon: Cherre Robins, MD;  Location: [place];  Service: Vascular;  Laterality: Right;   MAZE  2013   atrial appendage ligation and maze procedure in 2013   POLYPECTOMY  04/20/2020   Procedure: POLYPECTOMY;  Surgeon: [name], MD;  Location: [facility];  Service: Gastroenterology;;   Current Outpatient Medications on File Prior to Visit  Medication Sig Dispense Refill   aspirin EC 81 MG tablet Take 81 mg by mouth daily. Swallow whole.     atorvastatin (LIPITOR) 80 MG tablet Take 80 mg by mouth every evening.      carvedilol (COREG) 3.125 MG tablet Take 6.25 mg by mouth 2 (two) times daily with a meal.     enalapril (VASOTEC) 5 MG tablet Take 5 mg by mouth daily.     enoxaparin (LOVENOX) 100 MG/ML injection Inject 90 mg into the skin every 12 (twelve) hours.      finasteride (PROSCAR) 5 MG tablet Take 1 tablet (5 mg total) by mouth daily. For enlarged prostate. 90 tablet 3   furosemide (LASIX) 40 MG tablet Take 1 tablet (40 mg total) by mouth daily. 30 tablet 3   oxyCODONE-acetaminophen (PERCOCET) 5-325 MG tablet Take 1 tablet by mouth every 6 (six) hours as needed. 28 tablet 0   silver sulfADIAZINE (SILVADENE) 1 % cream Apply 1 application. topically daily. (Patient taking differently: Apply 1 application  topically daily as needed (wound care).) 50 g 0   warfarin (COUMADIN) 1 MG tablet Take 0.5-1 mg by mouth See admin instructions. Take 1 mg with 6 mg to equal 7 mg every Mon, Wed, and Fri  Take 0.5 mg with the 6 mg to equal 6.5 mg every Sun, Tues, Thurs, and Sat     warfarin (COUMADIN) 6 MG tablet Take 6 mg by mouth daily.     No current facility-administered medications on file prior to visit.   Not on File Social History   Socioeconomic History   Marital status: Married    Spouse name: Not on file   Number of children: Not on file   Years of education: Not on file   Highest education level: Not on file  Occupational History   Not on file  Tobacco Use   Smoking status: Former    Years: 15.00    Types: Cigarettes    Passive exposure: Never   Smokeless tobacco: Never  Vaping Use   Vaping Use: Never used  Substance and Sexual Activity   Alcohol use: Yes    Comment: ocassional   Drug use: Never   Sexual activity: Not on file  Other Topics Concern   Not on file  Social History Narrative   Not on file   Social Determinants of Health   Financial Resource Strain: Not on file  Food Insecurity: Not on file  Transportation Needs: Not on file  Physical Activity: Not on file  Stress: Not on file  Social Connections: Not on file  Intimate Partner Violence: Not on file    Review of Systems  All other systems reviewed and are negative.      Objective:   Physical Exam Constitutional:      General: He is not in acute distress.     Appearance: Normal appearance. He is normal weight. He is not ill-appearing or toxic-appearing.  Eyes:     Conjunctiva/sclera:     Right eye: Hemorrhage present.  Cardiovascular:     Rate and Rhythm: Normal rate and regular rhythm.     Heart sounds: Murmur heard.  Pulmonary:     Effort: Pulmonary effort is normal. No respiratory distress.     Breath sounds: Normal breath sounds. No stridor. No wheezing, rhonchi or rales.  Abdominal:     General: Abdomen is flat. Bowel sounds are normal.  There is no distension.     Palpations: Abdomen is soft.     Tenderness: There is no abdominal tenderness. There is no guarding.  Neurological:     Mental Status: He is alert.       Assessment & Plan:  Subconjunctival hemorrhage of left eye Patient has a subconjunctival hemorrhage in his right eye exacerbated by Coumadin.  Otherwise he is asymptomatic.  Recommended decreasing his Coumadin dose to 6 mg from 6.5 mg a day to try to get his INR closer to 2.5 rather than 3.5.  Otherwise as long as he is asymptomatic, no further treatment is necessary and this should resolve spontaneously on .

## 2021-11-27 NOTE — Progress Notes (Signed)
Eye

## 2021-12-05 ENCOUNTER — Ambulatory Visit (INDEPENDENT_AMBULATORY_CARE_PROVIDER_SITE_OTHER): Payer: Medicare Other | Admitting: Physician Assistant

## 2021-12-05 VITALS — BP 114/70 | HR 76 | Temp 98.2°F | Resp 20 | Ht 68.0 in | Wt 208.0 lb

## 2021-12-05 DIAGNOSIS — I739 Peripheral vascular disease, unspecified: Secondary | ICD-10-CM

## 2021-12-05 NOTE — Progress Notes (Signed)
POST OPERATIVE OFFICE NOTE    CC:  F/u for surgery  HPI:  This is a 66 y.o. male who is s/p right common femoral artery endarterectomy and right femoral to above-the-knee popliteal bypass with GSV on 09/14/2021 by Dr. Donzetta Matters.  He was taken back to the OR on 09/15/2021 for exploration and bleeding.  On 7/5 he again returned to the OR for washout and was placed on antibiotics for eventual culture data showing Pseudomonas growth.  Patient states the right groin incision has since healed.  He still has a wound VAC to the medial thigh wound of his right leg managed by home health.  He believes this is healing well.  He denies any rest pain in his foot however admittedly he is deconditioned with limited mobility since hospitalization.  He is on Coumadin for mechanical heart valve.  He is also on aspirin and a statin daily.  He denies tobacco use.  Not on File  Current Outpatient Medications  Medication Sig Dispense Refill   aspirin EC 81 MG tablet Take 81 mg by mouth daily. Swallow whole.     atorvastatin (LIPITOR) 80 MG tablet Take 80 mg by mouth every evening.      carvedilol (COREG) 6.25 MG tablet Take 6.25 mg by mouth 2 (two) times daily.     enalapril (VASOTEC) 5 MG tablet Take 5 mg by mouth daily.     enoxaparin (LOVENOX) 100 MG/ML injection Inject 90 mg into the skin every 12 (twelve) hours.     finasteride (PROSCAR) 5 MG tablet Take 1 tablet (5 mg total) by mouth daily. For enlarged prostate. 90 tablet 3   furosemide (LASIX) 40 MG tablet Take 1 tablet (40 mg total) by mouth daily. 30 tablet 3   oxyCODONE-acetaminophen (PERCOCET) 5-325 MG tablet Take 1 tablet by mouth every 6 (six) hours as needed. 28 tablet 0   silver sulfADIAZINE (SILVADENE) 1 % cream Apply 1 application. topically daily. (Patient taking differently: Apply 1 application  topically daily as needed (wound care).) 50 g 0   warfarin (COUMADIN) 1 MG tablet Take 0.5-1 mg by mouth See admin instructions. Take 1 mg with 6 mg to equal 7  mg every Mon, Wed, and Fri  Take 0.5 mg with the 6 mg to equal 6.5 mg every Sun, Tues, Thurs, and Sat     warfarin (COUMADIN) 6 MG tablet Take 6 mg by mouth daily.     No current facility-administered medications for this visit.     ROS:  See HPI  Physical Exam:  Vitals:   12/05/21 1056  BP: 114/70  Pulse: 76  Resp: 20  Temp: 98.2 F (36.8 C)  TempSrc: Temporal  SpO2: 97%  Weight: 208 lb (94.3 kg)  Height: '5\' 8"'$  (1.727 m)    Incision: Right groin healed; small 3 x 2 cm area of the medial thigh with granulation tissue and no purulence Extremities: Palpable right PT pulse Neuro: A&O  Assessment/Plan:  This is a 66 y.o. male who is s/p: Right common femoral endarterectomy and right femoral to above-the-knee popliteal bypass with vein with subsequent wound complications  -Right leg well-perfused with 2+ palpable PT pulse -Right groin incision well-healed; medial thigh incision with small open area remaining about 2 x 3 cm.  He does have some peri-incisional skin irritation from VAC adhesive.  We will do wet-to-drys until home health can come out to replace wound VAC on Friday. -We will check a bypass duplex and ABI in 3 weeks  Dagoberto Ligas, PA-C [facility] [phone]  Clinic MD:  Donzetta Matters

## 2021-12-06 ENCOUNTER — Other Ambulatory Visit: Payer: Self-pay

## 2021-12-06 DIAGNOSIS — I739 Peripheral vascular disease, unspecified: Secondary | ICD-10-CM

## 2021-12-19 ENCOUNTER — Telehealth: Payer: Self-pay

## 2021-12-19 NOTE — Telephone Encounter (Signed)
Kim RN with [phone] [facility] called stating that the pt's R upper thigh wound was so shallow now that she is unable to get the wound vac dressing on it anymore. She is requesting an order to d/c the wound vac and do a wet-to-dry dressing instead.   Reviewed pt's chart, returned call for clarification, no answer, lf vm giving verbal orders to d/c wound vac and start wet-to-dry dressing.

## 2021-12-20 ENCOUNTER — Other Ambulatory Visit: Payer: Medicare Other

## 2021-12-24 ENCOUNTER — Telehealth: Payer: Self-pay | Admitting: Family Medicine

## 2021-12-24 NOTE — Telephone Encounter (Signed)
Patient's wife Gae Bon called to report blistery appearance on legs. Stated patient has been taken water pills as prescribed. Requesting call back with instructions; wants to know if dose should be increased.   Pharmacy confirmed as   [facility], [address], [place] [address]  Phone:  [phone]  Fax:  [phone]  DEA #:  [id number]   Refill recently picked up.   LOV: 8/296/23.  Patient's wife decided to schedule an appt with the NP for tomorrow.

## 2021-12-25 ENCOUNTER — Ambulatory Visit (INDEPENDENT_AMBULATORY_CARE_PROVIDER_SITE_OTHER): Payer: Medicare Other | Admitting: Family Medicine

## 2021-12-25 ENCOUNTER — Encounter: Payer: Self-pay | Admitting: Family Medicine

## 2021-12-25 VITALS — BP 124/62 | HR 77 | Temp 98.2°F | Ht 68.0 in | Wt 213.8 lb

## 2021-12-25 DIAGNOSIS — R600 Localized edema: Secondary | ICD-10-CM | POA: Diagnosis not present

## 2021-12-25 DIAGNOSIS — M79604 Pain in right leg: Secondary | ICD-10-CM | POA: Diagnosis not present

## 2021-12-25 NOTE — Progress Notes (Signed)
Acute Office Visit  Subjective:     Patient ID: [name], male    DOB: 1956-02-19, 66 y.o.   MRN: [id number]  Chief Complaint  Patient presents with   Blister    Pt c/o blistery rash to R leg, with skin feeling tight. Started yesterday per pt.     Complaints of right lower extremity swelling and blisters for 2 days. Sensation at baseline. Has not been wearing compression stockings. Currently taking Lasix '40mg'$  as needed, last dose was yesterday and a few days prior to that. He has a scheduled follow up for his endarterectomy and bypass graft tomorrow. He has a history of lower extremity edema but is concerned about it being worse and blistering on right calf and ankle. He is endorsing worse pain than his baseline described as shooting pain to right leg at various locations, he has been more sedentary since his procedure, denies shortness of breath, chest pain, palpitations.    Past Medical History:  Diagnosis Date   Anginal pain ([place])    Cancer ([place])    skin   COPD (chronic obstructive pulmonary disease) (H[facility])    Coronary artery disease    Dyspnea    Dysrhythmia    afib, s/p LAA ligation/MAZE 2013; recurrent aflutter 04/2021   History of hiatal hernia    medication related in 20s   Hx of artificial heart valve replacement    Hypertension    Mitral insufficiency and aortic stenosis    mechanical AVR/MVR 2013 in [place]   Myocardial infarction ([place])    Pneumonia    as a child   PVD (peripheral vascular disease) ([place])    Rheumatic heart disease    Stroke [facility])    Past Surgical History:  Procedure Laterality Date   ABDOMINAL AORTOGRAM W/LOWER EXTREMITY Bilateral 04/16/2021   Procedure: ABDOMINAL AORTOGRAM W/LOWER EXTREMITY;  Surgeon: Waynetta Sandy, MD;  Location: [place] [facility];  Service: Cardiovascular;  Laterality: Bilateral;   AORTIC AND MITRAL VALVE REPLACEMENT  2013   mechanical AVR/MVR in [place] Right 09/15/2021    Procedure: APPLICATION OF WOUND VAC RIGTH LEG;  Surgeon: Cherre Robins, MD;  Location: [place] OR;  Service: Vascular;  Laterality: Right;   APPLICATION OF WOUND VAC  10/05/2021   Procedure: APPLICATION OF RIGHT LEG WOUND VAC;  Surgeon: Waynetta Sandy, MD;  Location: [facility] OR;  Service: Vascular;;   BIOPSY  04/20/2020   Procedure: BIOPSY;  Surgeon: [name], MD;  Location: [facility];  Service: Gastroenterology;;   CARDIAC VALVE REPLACEMENT     COLONOSCOPY WITH PROPOFOL N/A 04/20/2020   Procedure: COLONOSCOPY WITH PROPOFOL;  Surgeon: [name], MD;  Location: [facility];  Service: Gastroenterology;  Laterality: N/A;   CORONARY ANGIOPLASTY WITH STENT PLACEMENT  10/2017   FEMORAL ARTERY EXPLORATION Right 09/15/2021   Procedure: FEMORAL ARTERY RE EXPLORATION;  Surgeon: Cherre Robins, MD;  Location: [place];  Service: Vascular;  Laterality: Right;   FEMORAL-POPLITEAL BYPASS GRAFT Right 09/14/2021   Procedure: RIGHT COMMON FEMORAL-ABOVE KNEE POPLITEAL ARTERY BYPASS WITH VEIN;  Surgeon: Waynetta Sandy, MD;  Location: [place];  Service: Vascular;  Laterality: Right;   INCISION AND DRAINAGE OF WOUND Right 10/05/2021   Procedure: RIGHT Karl Ito;  Surgeon: Waynetta Sandy, MD;  Location: [place];  Service: Vascular;  Laterality: Right;   LAPAROSCOPIC APPENDECTOMY N/A 05/17/2019   Procedure: INTERVAL LAPAROSCOPIC APPENDECTOMY;  Surgeon: Greer Pickerel, MD;  Location:  [facility];  Service: General;  Laterality: N/A;   LOWER EXTREMITY ANGIOGRAM Right 09/15/2021   Procedure: RIGHT LOWER EXTREMITY ANGIOGRAM;  Surgeon: Cherre Robins, MD;  Location: [facility] OR;  Service: Vascular;  Laterality: Right;   MAZE  2013   atrial appendage ligation and maze procedure in 2013   POLYPECTOMY  04/20/2020   Procedure: POLYPECTOMY;  Surgeon: [name], MD;  Location: [facility];  Service: Gastroenterology;;   Current Outpatient Medications on File Prior to Visit  Medication Sig Dispense  Refill   aspirin EC 81 MG tablet Take 81 mg by mouth daily. Swallow whole.     atorvastatin (LIPITOR) 80 MG tablet Take 80 mg by mouth every evening.      carvedilol (COREG) 6.25 MG tablet Take 6.25 mg by mouth 2 (two) times daily.     enalapril (VASOTEC) 5 MG tablet Take 5 mg by mouth daily.     finasteride (PROSCAR) 5 MG tablet Take 1 tablet (5 mg total) by mouth daily. For enlarged prostate. 90 tablet 3   furosemide (LASIX) 40 MG tablet Take 1 tablet (40 mg total) by mouth daily. 30 tablet 3   warfarin (COUMADIN) 1 MG tablet Take 0.5-1 mg by mouth See admin instructions. Take 1 mg with 6 mg to equal 7 mg every Mon, Wed, and Fri  Take 0.5 mg with the 6 mg to equal 6.5 mg every Sun, Tues, Thurs, and Sat     warfarin (COUMADIN) 6 MG tablet Take 6 mg by mouth daily.     No current facility-administered medications on file prior to visit.   Not on File  Review of Systems  Constitutional: Negative.   Respiratory: Negative.    Cardiovascular:  Positive for leg swelling.  Skin:        blistering to right lower extremity  Neurological:  Negative for sensory change (pain to right lower extremity).        Objective:    BP 124/62   Pulse 77   Temp 98.2 F (36.8 C)   Ht '5\' 8"'$  (1.727 m)   Wt 213 lb 12.8 oz (97 kg)   SpO2 100%   BMI 32.51 kg/m    Physical Exam Constitutional:      Appearance: Normal appearance.  Cardiovascular:     Rate and Rhythm: Normal rate and regular rhythm.     Heart sounds: Normal heart sounds.  Pulmonary:     Effort: Pulmonary effort is normal.     Breath sounds: Normal breath sounds.  Musculoskeletal:     Right lower leg: Swelling and tenderness present. 1+ Pitting Edema present.     Left lower leg: Swelling present. 1+ Pitting Edema present.  Neurological:     Mental Status: He is alert.     No results found for any visits on 12/25/21.      Assessment & Plan:   Lower extremity edema His edema is bilateral, given there is no erythema,  warmth, or calf pain in addition to the fact that he is on coumadin, my suspicion for DVT is low. Will obtain D-dimer at this time. Encouraged to seek emergent care for shortness of breath, chest pain, and palpitations. He is scheduled for a follow up with vascular surgery tomorrow. Discussed taking Lasix '40mg'$  daily as prescribed and wearing TED hose to decrease swelling.  Pain of right lower extremity - Plan: D-dimer, quantitative Given the patients complicated post op course and wound management and the characteristics of scattered, diffuse, shooting pains, I  suspect that his pain is neuropathic. We discussed that gabapentin may worsen the swelling and he stated the pain is only mild so we will defer that at this time.   No orders of the defined types were placed in this encounter.   Return if symptoms worsen or fail to improve.  Rubie Maid, FNP

## 2021-12-26 ENCOUNTER — Ambulatory Visit (HOSPITAL_COMMUNITY)
Admission: RE | Admit: 2021-12-26 | Discharge: 2021-12-26 | Disposition: A | Discharge status: Home / Self Care | Payer: Medicare Other | Source: Ambulatory Visit | Attending: Vascular Surgery | Admitting: Vascular Surgery

## 2021-12-26 ENCOUNTER — Ambulatory Visit: Payer: Medicare Other | Admitting: Physician Assistant

## 2021-12-26 ENCOUNTER — Ambulatory Visit (INDEPENDENT_AMBULATORY_CARE_PROVIDER_SITE_OTHER)
Admission: RE | Admit: 2021-12-26 | Discharge: 2021-12-26 | Disposition: A | Discharge status: Home / Self Care | Payer: Medicare Other | Source: Ambulatory Visit | Attending: Vascular Surgery | Admitting: Vascular Surgery

## 2021-12-26 VITALS — BP 124/66 | HR 73 | Temp 98.0°F | Ht 68.0 in | Wt 213.0 lb

## 2021-12-26 DIAGNOSIS — M7989 Other specified soft tissue disorders: Secondary | ICD-10-CM

## 2021-12-26 DIAGNOSIS — I739 Peripheral vascular disease, unspecified: Secondary | ICD-10-CM

## 2021-12-26 LAB — D-DIMER, QUANTITATIVE: D-Dimer, Quant: 0.42 mcg/mL FEU (ref <0.50)

## 2021-12-26 NOTE — Progress Notes (Signed)
Established Previous Bypass   History of Present Illness   [name] is a 66 y.o. (30-Jan-1956) male who presents for surveillance of PAD.  He is s/p right common femoral artery endarterectomy and right femoral to above knee popliteal bypass with GSV on 09/14/2021 by Dr. Donzetta Matters.  He was taken back to the OR on 09/15/2021 for exploration and bleeding.  Again on 10/03/2021 he returned to the OR for washout and was placed on antibiotics for cultures growing Pseudomonas.  Since then the patient's right groin incision has healed.  At his last visit with [place] on 12/05/2021, his medial thigh incision was healing well.  It measured about 2 x 3 cm.  His wound VAC was continued until it no longer fit the wound on 12/19/2021.  At that time he was converted to wet-to-dry dressings by home health.   At follow-up today he believes that his wound is healing well and has gotten smaller with time.  Home health comes out to his house 1x weekly for wound check and dressing change.  The patient's wife does a wet-to-dry dressing on the days that home health is not there.  He denies any fever, chills, drainage from the wound.  He is experiencing worsened right lower extremity swelling after bypass surgery and is curious if he can now use compression stockings.  He denies any claudication, rest pain, nonhealing wounds of the right lower extremity.  He is on aspirin, statin.  He is on Coumadin for mechanical heart valve.  Current Outpatient Medications  Medication Sig Dispense Refill   aspirin EC 81 MG tablet Take 81 mg by mouth daily. Swallow whole.     atorvastatin (LIPITOR) 80 MG tablet Take 80 mg by mouth every evening.      carvedilol (COREG) 6.25 MG tablet Take 6.25 mg by mouth 2 (two) times daily.     enalapril (VASOTEC) 5 MG tablet Take 5 mg by mouth daily.     finasteride (PROSCAR) 5 MG tablet Take 1 tablet (5 mg total) by mouth daily. For enlarged prostate. 90 tablet 3   furosemide (LASIX) 40 MG tablet Take 1  tablet (40 mg total) by mouth daily. 30 tablet 3   warfarin (COUMADIN) 1 MG tablet Take 0.5-1 mg by mouth See admin instructions. Take 1 mg with 6 mg to equal 7 mg every Mon, Wed, and Fri  Take 0.5 mg with the 6 mg to equal 6.5 mg every Sun, Tues, Thurs, and Sat     warfarin (COUMADIN) 6 MG tablet Take 6 mg by mouth daily.     No current facility-administered medications for this visit.    REVIEW OF SYSTEMS (negative unless checked):   Cardiac:  '[]'$  Chest pain or chest pressure? '[]'$  Shortness of breath upon activity? '[]'$  Shortness of breath when lying flat? '[]'$  Irregular heart rhythm?  Vascular:  '[]'$  Pain in calf, thigh, or hip brought on by walking? '[]'$  Pain in feet at night that wakes you up from your sleep? '[]'$  Blood clot in your veins? '[x]'$  Leg swelling?  Pulmonary:  '[]'$  Oxygen at home? '[]'$  Productive cough? '[]'$  Wheezing?  Neurologic:  '[]'$  Sudden weakness in arms or legs? '[]'$  Sudden numbness in arms or legs? '[]'$  Sudden onset of difficult speaking or slurred speech? '[]'$  Temporary loss of vision in one eye? '[]'$  Problems with dizziness?  Gastrointestinal:  '[]'$  Blood in stool? '[]'$  Vomited blood?  Genitourinary:  '[]'$  Burning when urinating? '[]'$  Blood in urine?  Psychiatric:  '[]'$  Major  depression  Hematologic:  '[]'$  Bleeding problems? '[]'$  Problems with blood clotting?  Dermatologic:  '[]'$  Rashes or ulcers?  Constitutional:  '[]'$  Fever or chills?  Ear/Nose/Throat:  '[]'$  Change in hearing? '[]'$  Nose bleeds? '[]'$  Sore throat?  Musculoskeletal:  '[]'$  Back pain? '[]'$  Joint pain? '[]'$  Muscle pain?   Physical Examination   Vitals:   12/26/21 1327  BP: 124/66  Pulse: 73  Temp: 98 F (36.7 C)  TempSrc: Temporal  SpO2: 100%  Weight: 213 lb (96.6 kg)  Height: '5\' 8"'$  (1.727 m)   Body mass index is 32.39 kg/m.  General:  WDWN in NAD; vital signs documented above Gait: Not observed HENT: WNL, normocephalic Pulmonary: normal non-labored breathing , without Rales, rhonchi,   wheezing Cardiac: regular HR Abdomen: soft, NT, no masses Skin: without rashes Vascular Exam/Pulses: Palpable right PT pulse Extremities: without ischemic changes, without Gangrene , without cellulitis; without open wounds; lipodermatosclerosis of the right lower extremity, hemosiderin staining of the left lower extremity.  Swelling in bilateral lower extremities Musculoskeletal: no muscle wasting or atrophy  Neurologic: A&O X 3;  No focal weakness or paresthesias are detected Psychiatric:  The pt has Normal affect.       Non-Invasive Vascular Imaging ABI (12/26/2021)  +---------+------------------+-----+---------+--------+  Right    Rt Pressure (mmHg)IndexWaveform Comment   +---------+------------------+-----+---------+--------+  Brachial 139                                       +---------+------------------+-----+---------+--------+  PTA      146               1.05 triphasic          +---------+------------------+-----+---------+--------+  DP       150               1.08 triphasic          +---------+------------------+-----+---------+--------+  Great Toe48                0.35 Abnormal           +---------+------------------+-----+---------+--------+   +---------+------------------+-----+---------+-------+  Left     Lt Pressure (mmHg)IndexWaveform Comment  +---------+------------------+-----+---------+-------+  Brachial 124                                      +---------+------------------+-----+---------+-------+  PTA      135               0.97 triphasic         +---------+------------------+-----+---------+-------+  DP       123               0.88 biphasic          +---------+------------------+-----+---------+-------+  Great Toe37                0.27 Abnormal          +---------+------------------+-----+---------+-------+   +-------+-----------+-----------+------------+------------+  ABI/TBIToday's  ABIToday's TBIPrevious ABIPrevious TBI  +-------+-----------+-----------+------------+------------+  Right  1.08       0.35       0.82        0.29          +-------+-----------+-----------+------------+------------+  Left   0.97       0.27       0.88        0.27          +-------+-----------+-----------+------------+------------+  Bypass Duplex (12/26/2021)  +--------+--------+-----+--------+---------+--------+  RIGHT   PSV cm/sRatioStenosisWaveform Comments  +--------+--------+-----+--------+---------+--------+  CFA Prox148                  biphasic           +--------+--------+-----+--------+---------+--------+  POP Mid 96                   triphasic          +--------+--------+-----+--------+---------+--------+        Right Graft #1:  +------------------+--------+--------+---------+--------+                    PSV cm/sStenosisWaveform Comments  +------------------+--------+--------+---------+--------+  Inflow            148             biphasic           +------------------+--------+--------+---------+--------+  Prox Anastomosis  122             triphasic          +------------------+--------+--------+---------+--------+  Proximal Graft    98              triphasic          +------------------+--------+--------+---------+--------+  Mid Graft         83              triphasic          +------------------+--------+--------+---------+--------+  Distal Graft      121             triphasic          +------------------+--------+--------+---------+--------+  Distal Anastomosis127             triphasic          +------------------+--------+--------+---------+--------+  Outflow           110             triphasic          +------------------+--------+--------+---------+--------+   Medical Decision Making   [name] is a 66 y.o. male who presents with PAD and surveillance of RLE bypass  Based  on the patient's vascular studies, his right ABI has greatly improved from 0.82 to 1.08.  His left ABI has improved from 0.88 to 0.97.  His RLE bypass is patent with triphasic flow no signs of stenosis. He has a palpable right PT pulse His right groin incision is well-healed.  His right medial thigh incision is almost healed, measuring today about 1x1 cm.  There are no signs of infection, and he will continue with wet-to-dry dressings until it is fully healed.  Home health is still visiting the patient 1x weekly for wound checks. He does not have any claudication, rest pain, wounds of the lower extremities. He still experiencing bothersome bilateral lower extremity swelling.  His ABIs are now high enough that he can use compression socks without concern for his arterial flow.  I have educated him on the proper use of his compression socks and elevating his legs as needed for swelling He can follow-up with [place] in 9 months for repeat ABIs and RLE bypass duplex study.  He understands to come sooner if he develops any issues with incision healing, or worsening issues with claudication, rest pain, wounds of the lower extremities.   Vicente Serene PA-C [facility] Office: [facility] MD: Donzetta Matters

## 2021-12-28 ENCOUNTER — Other Ambulatory Visit: Payer: Self-pay

## 2021-12-28 DIAGNOSIS — I739 Peripheral vascular disease, unspecified: Secondary | ICD-10-CM

## 2022-04-08 ENCOUNTER — Ambulatory Visit
Admission: EM | Admit: 2022-04-08 | Discharge: 2022-04-08 | Disposition: A | Discharge status: Home / Self Care | Payer: Medicare Other | Attending: Internal Medicine | Admitting: Internal Medicine

## 2022-04-08 DIAGNOSIS — N390 Urinary tract infection, site not specified: Secondary | ICD-10-CM | POA: Insufficient documentation

## 2022-04-08 LAB — POCT URINALYSIS DIP (MANUAL ENTRY)
Bilirubin, UA: NEGATIVE
Glucose, UA: NEGATIVE mg/dL
Ketones, POC UA: NEGATIVE mg/dL
Nitrite, UA: POSITIVE — AB
Protein Ur, POC: 100 mg/dL — AB
Spec Grav, UA: 1.02 (ref 1.010–1.025)
Urobilinogen, UA: 0.2 E.U./dL
pH, UA: 5.5 (ref 5.0–8.0)

## 2022-04-08 MED ORDER — CEPHALEXIN 500 MG PO CAPS: 500.0000 mg | ORAL_CAPSULE | Freq: Two times a day (BID) | ORAL | 0 refills | Status: DC

## 2022-04-08 NOTE — ED Triage Notes (Signed)
Pt reports blood in urine, frequent urination, scrotum swelling and pain, burning when he urinates x 1 week.

## 2022-04-08 NOTE — ED Provider Notes (Signed)
[facility]    CSN: [id number] Arrival date & time: 04/08/22  1051      History   Chief Complaint Chief Complaint  Patient presents with   Groin Pain    Possible UTI - Entered by patient    HPI [name] is a 67 y.o. male.   Patient presenting today with about a week of progressively worsening hematuria, urinary frequency, dysuria, scrotal pain.  Denies fever, chills, flank pain, abdominal pain, nausea vomiting or diarrhea.  So far not tried anything over-the-counter for symptoms.  Does have a history of urinary tract infections that started similarly.    Past Medical History:  Diagnosis Date   Anginal pain ([place])    Cancer ([place])    skin   COPD (chronic obstructive pulmonary disease) (HCC)    Coronary artery disease    Dyspnea    Dysrhythmia    afib, s/p LAA ligation/MAZE 2013; recurrent aflutter 04/2021   History of hiatal hernia    medication related in 20s   Hx of artificial heart valve replacement    Hypertension    Mitral insufficiency and aortic stenosis    mechanical AVR/MVR 2013 in [place]   Myocardial infarction [facility])    Pneumonia    as a child   PVD (peripheral vascular disease) ([place])    Rheumatic heart disease    Stroke [facility])     Patient Active Problem List   Diagnosis Date Noted   PAD (peripheral artery disease) ([place]) 09/14/2021   Atrial flutter with rapid ventricular response ([place]) 06/05/2021   Long term (current) use of anticoagulants 03/08/2021   Ischemic heart disease 03/07/2020   Status post coronary artery stent placement 03/07/2020   S/P laparoscopic appendectomy 05/17/2019   Appendicolith 03/20/2019   Tobacco abuse 03/20/2019   Chronic atrial fibrillation ([place]) 03/20/2019   Mitral insufficiency and aortic stenosis    COPD (chronic obstructive pulmonary disease) ([place])    Hyperlipidemia    Acute appendicitis with perforation, appendicolith, and abscess    PVD (peripheral vascular disease) ([place]) 02/24/2018   Status post aortic  valve and mitral valve replacement 02/24/2018   History of aortic valve replacement 02/24/2018   Paroxysmal atrial fibrillation ([place]) 02/24/2018   Essential hypertension 02/24/2018   CAD s/p inferior STEMI s/p DES to RCA in 10/2017  02/17/2018   Warfarin anticoagulation 02/10/2017    Past Surgical History:  Procedure Laterality Date   ABDOMINAL AORTOGRAM W/LOWER EXTREMITY Bilateral 04/16/2021   Procedure: ABDOMINAL AORTOGRAM W/LOWER EXTREMITY;  Surgeon: Waynetta Sandy, MD;  Location: [place] CV LAB;  Service: Cardiovascular;  Laterality: Bilateral;   AORTIC AND MITRAL VALVE REPLACEMENT  2013   mechanical AVR/MVR in [place] Right 09/15/2021   Procedure: APPLICATION OF WOUND VAC RIGTH LEG;  Surgeon: Cherre Robins, MD;  Location: [place] OR;  Service: Vascular;  Laterality: Right;   APPLICATION OF WOUND VAC  10/05/2021   Procedure: APPLICATION OF RIGHT LEG WOUND VAC;  Surgeon: Waynetta Sandy, MD;  Location: [facility];  Service: Vascular;;   BIOPSY  04/20/2020   Procedure: BIOPSY;  Surgeon: [name], MD;  [facility];  Service: Gastroenterology;;   CARDIAC VALVE REPLACEMENT     COLONOSCOPY WITH PROPOFOL N/A 04/20/2020   Procedure: COLONOSCOPY WITH PROPOFOL;  Surgeon: [name], MD;  [facility];  Service: Gastroenterology;  Laterality: N/A;   CORONARY ANGIOPLASTY WITH STENT PLACEMENT  10/2017   FEMORAL ARTERY EXPLORATION Right  09/15/2021   Procedure: FEMORAL ARTERY RE EXPLORATION;  Surgeon: Cherre Robins, MD;  Location: [place];  Service: Vascular;  Laterality: Right;   FEMORAL-POPLITEAL BYPASS GRAFT Right 09/14/2021   Procedure: RIGHT COMMON FEMORAL-ABOVE KNEE POPLITEAL ARTERY BYPASS WITH VEIN;  Surgeon: Waynetta Sandy, MD;  Location: [place];  Service: Vascular;  Laterality: Right;   INCISION AND DRAINAGE OF WOUND Right 10/05/2021   Procedure: RIGHT Karl Ito;  Surgeon: Waynetta Sandy,  MD;  Location: [place];  Service: Vascular;  Laterality: Right;   LAPAROSCOPIC APPENDECTOMY N/A 05/17/2019   Procedure: INTERVAL LAPAROSCOPIC APPENDECTOMY;  Surgeon: Greer Pickerel, MD;  [facility];  Service: General;  Laterality: N/A;   LOWER EXTREMITY ANGIOGRAM Right 09/15/2021   Procedure: RIGHT LOWER EXTREMITY ANGIOGRAM;  Surgeon: Cherre Robins, MD;  Location: [place];  Service: Vascular;  Laterality: Right;   MAZE  2013   atrial appendage ligation and maze procedure in 2013   POLYPECTOMY  04/20/2020   Procedure: POLYPECTOMY;  Surgeon: [name], MD;  [facility];  Service: Gastroenterology;;       Home Medications    Prior to Admission medications   Medication Sig Start Date End Date Taking? Authorizing Provider  cephALEXin (KEFLEX) 500 MG capsule Take 1 capsule (500 mg total) by mouth 2 (two) times daily. 04/08/22  Yes Volney American, PA-C  aspirin EC 81 MG tablet Take 81 mg by mouth daily. Swallow whole.    [provider]  atorvastatin (LIPITOR) 80 MG tablet Take 80 mg by mouth every evening.     [provider]  carvedilol (COREG) 6.25 MG tablet Take 6.25 mg by mouth 2 (two) times daily. 11/29/21   [provider]  enalapril (VASOTEC) 5 MG tablet Take 5 mg by mouth daily.    [provider]  finasteride (PROSCAR) 5 MG tablet Take 1 tablet (5 mg total) by mouth daily. For enlarged prostate. 09/25/21 09/20/22  Alexis Frock, MD  furosemide (LASIX) 40 MG tablet Take 1 tablet (40 mg total) by mouth daily. 11/05/21   [name], MD  warfarin (COUMADIN) 1 MG tablet Take 0.5-1 mg by mouth See admin instructions. Take 1 mg with 6 mg to equal 7 mg every Mon, Wed, and Fri  Take 0.5 mg with the 6 mg to equal 6.5 mg every Sun, Tues, Thurs, and Sat    [provider]  warfarin (COUMADIN) 6 MG tablet Take 6 mg by mouth daily. 02/21/21   [provider]    Family History Family History  Problem Relation Age of  Onset   Bladder Cancer Mother    Lung cancer Mother    Brain cancer Mother    Heart disease Father    Heart disease Sister    Diabetes Maternal Grandfather     Social History Social History   Tobacco Use   Smoking status: Former    Years: 15.00    Types: Cigarettes    Passive exposure: Never   Smokeless tobacco: Never  Vaping Use   Vaping Use: Never used  Substance Use Topics   Alcohol use: Yes    Comment: ocassional   Drug use: Never     Allergies   Patient has no allergy information on record.   Review of Systems Review of Systems Per HPI  Physical Exam Triage Vital Signs ED Triage Vitals  Enc Vitals Group     BP 04/08/22 1314 106/64     Pulse --  Resp 04/08/22 1314 20     Temp 04/08/22 1314 98.5 F (36.9 C)     Temp Source 04/08/22 1314 Oral     SpO2 04/08/22 1314 92 %     Weight --      Height --      Head Circumference --      Peak Flow --      Pain Score 04/08/22 1318 8     Pain Loc --      Pain Edu? --      Excl. in [place]? --    No data found.  Updated Vital Signs BP 106/64 (BP Location: Right Arm)   Temp 98.5 F (36.9 C) (Oral)   Resp 20   SpO2 92%   Visual Acuity Right Eye Distance:   Left Eye Distance:   Bilateral Distance:    Right Eye Near:   Left Eye Near:    Bilateral Near:     Physical Exam Vitals and nursing note reviewed.  Constitutional:      Appearance: Normal appearance.  HENT:     Head: Atraumatic.  Eyes:     Extraocular Movements: Extraocular movements intact.     Conjunctiva/sclera: Conjunctivae normal.  Cardiovascular:     Rate and Rhythm: Normal rate and regular rhythm.  Pulmonary:     Effort: Pulmonary effort is normal.     Breath sounds: Normal breath sounds.  Abdominal:     General: Bowel sounds are normal. There is no distension.     Palpations: Abdomen is soft.     Tenderness: There is no abdominal tenderness. There is no right CVA tenderness, left CVA tenderness or guarding.  Genitourinary:     Comments: GU exam deferred Musculoskeletal:        General: Normal range of motion.     Cervical back: Normal range of motion and neck supple.  Skin:    General: Skin is warm and dry.  Neurological:     General: No focal deficit present.     Mental Status: He is oriented to person, place, and time.  Psychiatric:        Mood and Affect: Mood normal.        Thought Content: Thought content normal.        Judgment: Judgment normal.      UC Treatments / Results  Labs (all labs ordered are listed, but only abnormal results are displayed) Labs Reviewed  POCT URINALYSIS DIP (MANUAL ENTRY) - Abnormal; Notable for the following components:      Result Value   Color, UA straw (*)    Blood, UA large (*)    Protein Ur, POC =100 (*)    Nitrite, UA Positive (*)    Leukocytes, UA Large (3+) (*)    All other components within normal limits  URINE CULTURE    EKG   Radiology No results found.  Procedures Procedures (including critical care time)  Medications Ordered in UC Medications - No data to display  Initial Impression / Assessment and Plan / UC Course  I have reviewed the triage vital signs and the nursing notes.  Pertinent labs & imaging results that were available during my care of the patient were reviewed by me and considered in my medical decision making (see chart for details).     Vital signs and exam overall reassuring, urinalysis with evidence of a urinary tract infection.  Treat with Keflex and await urine culture, adjust if needed.  Patient aware to follow INRs  closely given his use of Coumadin while on the antibiotics  Final Clinical Impressions(s) / UC Diagnoses   Final diagnoses:  Acute lower UTI   Discharge Instructions   None    ED Prescriptions     Medication Sig Dispense Auth. Provider   cephALEXin (KEFLEX) 500 MG capsule Take 1 capsule (500 mg total) by mouth 2 (two) times daily. 20 capsule Volney American, [place]      PDMP not reviewed  this encounter.   Volney American, [place] 04/08/22 1425

## 2022-04-10 LAB — URINE CULTURE: Culture: >=100000 — AB

## 2022-06-16 ENCOUNTER — Other Ambulatory Visit: Payer: Self-pay | Admitting: Family Medicine

## 2022-11-28 ENCOUNTER — Ambulatory Visit: Payer: Medicare Other | Admitting: Family Medicine

## 2022-11-28 VITALS — BP 110/60 | HR 82 | Temp 97.8°F | Ht 68.0 in | Wt 207.4 lb

## 2022-11-28 DIAGNOSIS — L03115 Cellulitis of right lower limb: Secondary | ICD-10-CM

## 2022-11-28 DIAGNOSIS — L97811 Non-pressure chronic ulcer of other part of right lower leg limited to breakdown of skin: Secondary | ICD-10-CM

## 2022-11-28 DIAGNOSIS — I83018 Varicose veins of right lower extremity with ulcer other part of lower leg: Secondary | ICD-10-CM | POA: Diagnosis not present

## 2022-11-28 MED ORDER — SILVER SULFADIAZINE 1 % EX CREA: 1.0000 | TOPICAL_CREAM | Freq: Every day | CUTANEOUS | 0 refills | Status: DC

## 2022-11-28 MED ORDER — CEPHALEXIN 500 MG PO CAPS: 500.0000 mg | ORAL_CAPSULE | Freq: Two times a day (BID) | ORAL | 0 refills | Status: AC

## 2022-11-28 NOTE — Progress Notes (Signed)
Subjective:    Patient ID: [name], male    DOB: 07/05/1955, 67 y.o.   MRN: [id number]  HPI  Patient presents today with a nonhealing wound on the right lateral aspect of his right leg.  He has a history of chronic venous insufficiency and peripheral vascular disease.  He also has a history of nonhealing venous stasis ulcers.  On bilateral leg just above the lateral malleolus is a 3 cm in diameter venous stasis ulcer.  There are some surrounding erythema and warmth concerning for secondary cellulitis.  It is weeping serous fluid.  Patient's been wrapping it with gauze at home but it is not healing. Past Medical History:  Diagnosis Date   Anginal pain (HCC)    Cancer (HCC)    skin   COPD (chronic obstructive pulmonary disease) (HCC)    Coronary artery disease    Dyspnea    Dysrhythmia    afib, s/p LAA ligation/MAZE 2013; recurrent aflutter 04/2021   History of hiatal hernia    medication related in 20s   Hx of artificial heart valve replacement    Hypertension    Mitral insufficiency and aortic stenosis    mechanical AVR/MVR 2013 in [place]   Myocardial infarction (HCC)    Pneumonia    as a child   PVD (peripheral vascular disease) (HCC)    Rheumatic heart disease    Stroke [facility])    Past Surgical History:  Procedure Laterality Date   ABDOMINAL AORTOGRAM W/LOWER EXTREMITY Bilateral 04/16/2021   Procedure: ABDOMINAL AORTOGRAM W/LOWER EXTREMITY;  Surgeon: Maeola Harman, MD;  Location: [facility] INVASIVE CV LAB;  Service: Cardiovascular;  Laterality: Bilateral;   AORTIC AND MITRAL VALVE REPLACEMENT  2013   mechanical AVR/MVR in [place]   APPENDECTOMY     APPLICATION OF WOUND VAC Right 09/15/2021   Procedure: APPLICATION OF WOUND VAC RIGTH LEG;  Surgeon: Leonie Douglas, MD;  [facility] OR;  Service: Vascular;  Laterality: Right;   APPLICATION OF WOUND VAC  10/05/2021   Procedure: APPLICATION OF RIGHT LEG WOUND VAC;  Surgeon: Maeola Harman, MD;  Location: [facility] OR;   Service: Vascular;;   BIOPSY  04/20/2020   Procedure: BIOPSY;  Surgeon: Kerin Salen, MD;  [facility];  Service: Gastroenterology;;   CARDIAC VALVE REPLACEMENT     COLONOSCOPY WITH PROPOFOL N/A 04/20/2020   Procedure: COLONOSCOPY WITH PROPOFOL;  Surgeon: Kerin Salen, MD;  [facility];  Service: Gastroenterology;  Laterality: N/A;   CORONARY ANGIOPLASTY WITH STENT PLACEMENT  10/2017   FEMORAL ARTERY EXPLORATION Right 09/15/2021   Procedure: FEMORAL ARTERY RE EXPLORATION;  Surgeon: Leonie Douglas, MD;  Location: [facility] OR;  Service: Vascular;  Laterality: Right;   FEMORAL-POPLITEAL BYPASS GRAFT Right 09/14/2021   Procedure: RIGHT COMMON FEMORAL-ABOVE KNEE POPLITEAL ARTERY BYPASS WITH VEIN;  Surgeon: Maeola Harman, MD;  Location: [facility] OR;  Service: Vascular;  Laterality: Right;   INCISION AND DRAINAGE OF WOUND Right 10/05/2021   Procedure: RIGHT Marden Noble;  Surgeon: Maeola Harman, MD;  Location: [facility] OR;  Service: Vascular;  Laterality: Right;   LAPAROSCOPIC APPENDECTOMY N/A 05/17/2019   Procedure: INTERVAL LAPAROSCOPIC APPENDECTOMY;  Surgeon: Gaynelle Adu, MD;  [facility];  Service: General;  Laterality: N/A;   LOWER EXTREMITY ANGIOGRAM Right 09/15/2021   Procedure: RIGHT LOWER EXTREMITY ANGIOGRAM;  Surgeon: Leonie Douglas, MD;  Location: [facility] OR;  Service: Vascular;  Laterality: Right;   MAZE  2013   atrial appendage ligation and maze procedure in  2013   POLYPECTOMY  04/20/2020   Procedure: POLYPECTOMY;  Surgeon: Kerin Salen, MD;  [facility];  Service: Gastroenterology;;   Current Outpatient Medications on File Prior to Visit  Medication Sig Dispense Refill   aspirin EC 81 MG tablet Take 81 mg by mouth daily. Swallow whole.     atorvastatin (LIPITOR) 80 MG tablet Take 80 mg by mouth every evening.      carvedilol (COREG) 6.25 MG tablet Take 6.25 mg by mouth 2 (two) times daily.     enalapril (VASOTEC) 5 MG tablet Take 5 mg by mouth  daily.     furosemide (LASIX) 40 MG tablet TAKE 1 TABLET(40 MG) BY MOUTH DAILY 30 tablet 3   warfarin (COUMADIN) 1 MG tablet Take 0.5-1 mg by mouth See admin instructions. Take 1 mg with 6 mg to equal 7 mg every Mon, Wed, and Fri  Take 0.5 mg with the 6 mg to equal 6.5 mg every Sun, Tues, Thurs, and Sat     warfarin (COUMADIN) 6 MG tablet Take 6 mg by mouth daily.     No current facility-administered medications on file prior to visit.   Not on File Social History   Socioeconomic History   Marital status: Married    Spouse name: Not on file   Number of children: Not on file   Years of education: Not on file   Highest education level: Not on file  Occupational History   Not on file  Tobacco Use   Smoking status: Former    Types: Cigarettes    Passive exposure: Never   Smokeless tobacco: Never  Vaping Use   Vaping status: Never Used  Substance and Sexual Activity   Alcohol use: Yes    Comment: ocassional   Drug use: Never   Sexual activity: Not on file  Other Topics Concern   Not on file  Social History Narrative   Not on file   Social Determinants of Health   Financial Resource Strain: Not on file  Food Insecurity: Not on file  Transportation Needs: Not on file  Physical Activity: Not on file  Stress: Not on file  Social Connections: Not on file  Intimate Partner Violence: Not on file    Review of Systems  All other systems reviewed and are negative.      Objective:   Physical Exam Constitutional:      General: He is not in acute distress.    Appearance: Normal appearance. He is normal weight. He is not ill-appearing or toxic-appearing.  Cardiovascular:     Rate and Rhythm: Normal rate and regular rhythm.     Heart sounds: Murmur heard.  Pulmonary:     Effort: Pulmonary effort is normal. No respiratory distress.     Breath sounds: Normal breath sounds. No stridor. No wheezing, rhonchi or rales.  Abdominal:     General: Abdomen is flat. Bowel sounds are  normal. There is no distension.     Palpations: Abdomen is soft.     Tenderness: There is no abdominal tenderness. There is no guarding.  Musculoskeletal:     Right lower leg: Pitting Edema present.     Left lower leg: Edema present.       Feet:  Skin:    Findings: Erythema and lesion present.  Neurological:     Mental Status: He is alert.       Assessment & Plan:  Venous stasis ulcer of other part of right lower leg limited to breakdown  of skin with varicose veins (HCC)  Cellulitis of right lower extremity I will start the patient on Keflex 500 mg 3 times daily for 7 days for cellulitis.  I wrapped the patient's leg with Silvadene and nonadherent gauze to cover the venous stasis ulcer.  I then placed the patient in an Radio broadcast assistant.  Change the dressing every 3 days until healed.  Patient will come back first of next week for me to change the dressing.

## 2022-12-05 ENCOUNTER — Ambulatory Visit: Payer: Medicare Other | Admitting: Family Medicine

## 2022-12-05 VITALS — BP 130/78 | HR 79 | Temp 97.8°F | Ht 68.0 in | Wt 208.4 lb

## 2022-12-05 DIAGNOSIS — I83018 Varicose veins of right lower extremity with ulcer other part of lower leg: Secondary | ICD-10-CM | POA: Diagnosis not present

## 2022-12-05 DIAGNOSIS — L97811 Non-pressure chronic ulcer of other part of right lower leg limited to breakdown of skin: Secondary | ICD-10-CM | POA: Diagnosis not present

## 2022-12-05 NOTE — Progress Notes (Signed)
Subjective:    Patient ID: [name], male    DOB: 04-May-1955, 67 y.o.   MRN: [id number]  HPI 11/28/22 Patient presents today with a nonhealing wound on the right lateral aspect of his right leg.  He has a history of chronic venous insufficiency and peripheral vascular disease.  He also has a history of nonhealing venous stasis ulcers.  On bilateral leg just above the lateral malleolus is a 3 cm in diameter venous stasis ulcer.  There are some surrounding erythema and warmth concerning for secondary cellulitis.  It is weeping serous fluid.  Patient's been wrapping it with gauze at home but it is not healing.  At that time, my plan was: I will start the patient on Keflex 500 mg 3 times daily for 7 days for cellulitis.  I wrapped the patient's leg with Silvadene and nonadherent gauze to cover the venous stasis ulcer.  I then placed the patient in an Radio broadcast assistant.  Change the dressing every 3 days until healed.  Patient will come back first of next week for me to change the dressing.  12/05/22  The wound is looking much better today.  There is no excessive drainage.  There is no surrounding erythema.  There is no warmth.  The cellulitis has resolved.  Patient's wife has been changing his Unna boots every 3 days.  She is very experienced with his having had to perform similar dressing changes for the patient in the past Past Medical History:  Diagnosis Date   Anginal pain (HCC)    Cancer (HCC)    skin   COPD (chronic obstructive pulmonary disease) (HCC)    Coronary artery disease    Dyspnea    Dysrhythmia    afib, s/p LAA ligation/MAZE 2013; recurrent aflutter 04/2021   History of hiatal hernia    medication related in 20s   Hx of artificial heart valve replacement    Hypertension    Mitral insufficiency and aortic stenosis    mechanical AVR/MVR 2013 in [place]   Myocardial infarction (HCC)    Pneumonia    as a child   PVD (peripheral vascular disease) (HCC)    Rheumatic heart disease     Stroke [facility])    Past Surgical History:  Procedure Laterality Date   ABDOMINAL AORTOGRAM W/LOWER EXTREMITY Bilateral 04/16/2021   Procedure: ABDOMINAL AORTOGRAM W/LOWER EXTREMITY;  Surgeon: Maeola Harman, MD;  Location: [facility] INVASIVE CV LAB;  Service: Cardiovascular;  Laterality: Bilateral;   AORTIC AND MITRAL VALVE REPLACEMENT  2013   mechanical AVR/MVR in [place]   APPENDECTOMY     APPLICATION OF WOUND VAC Right 09/15/2021   Procedure: APPLICATION OF WOUND VAC RIGTH LEG;  Surgeon: Leonie Douglas, MD;  Location: [facility] OR;  Service: Vascular;  Laterality: Right;   APPLICATION OF WOUND VAC  10/05/2021   Procedure: APPLICATION OF RIGHT LEG WOUND VAC;  Surgeon: Maeola Harman, MD;  Location[facility] OR;  Service: Vascular;;   BIOPSY  04/20/2020   Procedure: BIOPSY;  Surgeon: Kerin Salen, MD;  Location: [facility] ENDOSCOPY;  Service: Gastroenterology;;   CARDIAC VALVE REPLACEMENT     COLONOSCOPY WITH PROPOFOL N/A 04/20/2020   Procedure: COLONOSCOPY WITH PROPOFOL;  Surgeon: Kerin Salen, MD;  Location: [facility];  Service: Gastroenterology;  Laterality: N/A;   CORONARY ANGIOPLASTY WITH STENT PLACEMENT  10/2017   FEMORAL ARTERY EXPLORATION Right 09/15/2021   Procedure: FEMORAL ARTERY RE EXPLORATION;  Surgeon: Leonie Douglas, MD;  Location: [facility] OR;  Service: Vascular;  Laterality: Right;   FEMORAL-POPLITEAL BYPASS GRAFT Right 09/14/2021   Procedure: RIGHT COMMON FEMORAL-ABOVE KNEE POPLITEAL ARTERY BYPASS WITH VEIN;  Surgeon: Maeola Harman, MD;  Location: [facility] OR;  Service: Vascular;  Laterality: Right;   INCISION AND DRAINAGE OF WOUND Right 10/05/2021   Procedure: RIGHT Marden Noble;  Surgeon: Maeola Harman, MD;  Location: [facility] OR;  Service: Vascular;  Laterality: Right;   LAPAROSCOPIC APPENDECTOMY N/A 05/17/2019   Procedure: INTERVAL LAPAROSCOPIC APPENDECTOMY;  Surgeon: Gaynelle Adu, MD;  Location: [facility] ORS;  Service: General;  Laterality: N/A;   LOWER EXTREMITY ANGIOGRAM Right  09/15/2021   Procedure: RIGHT LOWER EXTREMITY ANGIOGRAM;  Surgeon: Leonie Douglas, MD;  Location: [facility] OR;  Service: Vascular;  Laterality: Right;   MAZE  2013   atrial appendage ligation and maze procedure in 2013   POLYPECTOMY  04/20/2020   Procedure: POLYPECTOMY;  Surgeon: Kerin Salen, MD;  Location: [facility];  Service: Gastroenterology;;   Current Outpatient Medications on File Prior to Visit  Medication Sig Dispense Refill   aspirin EC 81 MG tablet Take 81 mg by mouth daily. Swallow whole.     atorvastatin (LIPITOR) 80 MG tablet Take 80 mg by mouth every evening.      carvedilol (COREG) 6.25 MG tablet Take 6.25 mg by mouth 2 (two) times daily.     cephALEXin (KEFLEX) 500 MG capsule Take 1 capsule (500 mg total) by mouth 2 (two) times daily. 20 capsule 0   enalapril (VASOTEC) 5 MG tablet Take 5 mg by mouth daily.     furosemide (LASIX) 40 MG tablet TAKE 1 TABLET(40 MG) BY MOUTH DAILY 30 tablet 3   silver sulfADIAZINE (SILVADENE) 1 % cream Apply 1 Application topically daily. 50 g 0   warfarin (COUMADIN) 1 MG tablet Take 0.5-1 mg by mouth See admin instructions. Take 1 mg with 6 mg to equal 7 mg every Mon, Wed, and Fri  Take 0.5 mg with the 6 mg to equal 6.5 mg every Sun, Tues, Thurs, and Sat     warfarin (COUMADIN) 6 MG tablet Take 6 mg by mouth daily.     No current facility-administered medications on file prior to visit.   Not on File Social History   Socioeconomic History   Marital status: Married    Spouse name: Not on file   Number of children: Not on file   Years of education: Not on file   Highest education level: Not on file  Occupational History   Not on file  Tobacco Use   Smoking status: Former    Types: Cigarettes    Passive exposure: Never   Smokeless tobacco: Never  Vaping Use   Vaping status: Never Used  Substance and Sexual Activity   Alcohol use: Yes    Comment: ocassional   Drug use: Never   Sexual activity: Not on file  Other Topics Concern    Not on file  Social History Narrative   Not on file   Social Determinants of Health   Financial Resource Strain: Not on file  Food Insecurity: Not on file  Transportation Needs: Not on file  Physical Activity: Not on file  Stress: Not on file  Social Connections: Not on file  Intimate Partner Violence: Not on file    Review of Systems  All other systems reviewed and are negative.      Objective:   Physical Exam Constitutional:      General: He is not in acute distress.  Appearance: Normal appearance. He is normal weight. He is not ill-appearing or toxic-appearing.  Cardiovascular:     Rate and Rhythm: Normal rate and regular rhythm.     Heart sounds: Murmur heard.  Pulmonary:     Effort: Pulmonary effort is normal. No respiratory distress.     Breath sounds: Normal breath sounds. No stridor. No wheezing, rhonchi or rales.  Abdominal:     General: Abdomen is flat. Bowel sounds are normal. There is no distension.     Palpations: Abdomen is soft.     Tenderness: There is no abdominal tenderness. There is no guarding.  Musculoskeletal:     Right lower leg: No edema.     Left lower leg: No edema.       Feet:  Skin:    Findings: Lesion present. No erythema.  Neurological:     Mental Status: He is alert.       Assessment & Plan:  Venous stasis ulcer of other part of right lower leg limited to breakdown of skin with varicose veins (HCC) Cellulitis has resolved.  I covered the wound with a nonadherent gauze and then placed the patient back in an una boot.  Recommended dressing changes similar to this every 2 to 3 days until healed.  Anticipate 3 to 4 weeks for healing.  Reassess weekly or sooner if worsening.

## 2023-01-02 ENCOUNTER — Ambulatory Visit: Payer: Medicare Other | Admitting: Family Medicine

## 2023-01-02 VITALS — BP 128/76 | HR 83 | Temp 97.9°F | Ht 68.0 in | Wt 208.0 lb

## 2023-01-02 DIAGNOSIS — I83012 Varicose veins of right lower extremity with ulcer of calf: Secondary | ICD-10-CM

## 2023-01-02 DIAGNOSIS — L97212 Non-pressure chronic ulcer of right calf with fat layer exposed: Secondary | ICD-10-CM | POA: Diagnosis not present

## 2023-01-02 NOTE — Progress Notes (Signed)
Subjective:    Patient ID: [name], male    DOB: [birth year], 67 y.o.   MRN: [id number]  HPI 11/28/22 Patient presents today with a nonhealing wound on the right lateral aspect of his right leg.  He has a history of chronic venous insufficiency and peripheral vascular disease.  He also has a history of nonhealing venous stasis ulcers.  On right leg just above the lateral malleolus is a 3 cm in diameter venous stasis ulcer.  There are some surrounding erythema and warmth concerning for secondary cellulitis.  It is weeping serous fluid.  Patient's been wrapping it with gauze at home but it is not healing.  At that time, my plan was: I will start the patient on Keflex 500 mg 3 times daily for 7 days for cellulitis.  I wrapped the patient's leg with Silvadene and nonadherent gauze to cover the venous stasis ulcer.  I then placed the patient in an Radio broadcast assistant.  Change the dressing every 3 days until healed.  Patient will come back first of next week for me to change the dressing.  12/05/22  The wound is looking much better today.  There is no excessive drainage.  There is no surrounding erythema.  There is no warmth.  The cellulitis has resolved.  Patient's wife has been changing his Unna boots every 3 days.  She is very experienced with his having had to perform similar dressing changes for the patient in the past.  At that time, my plan was: Cellulitis has resolved.  I covered the wound with a nonadherent gauze and then placed the patient back in an una boot.  Recommended dressing changes similar to this every 2 to 3 days until healed.  Anticipate 3 to 4 weeks for healing.  Reassess weekly or sooner if worsening.  01/02/23  Patient has been changing his Unna boot on a daily basis however the venous stasis ulcer shown above will not heal.  He has copious yellow serous fluid leaking from the wound.  There is no erythema.  There is no warmth.  There is no pain.  The patient had arterial Dopplers performed  last year in September which showed a normal ABI in the right leg although TBI was severely diminished.  Therefore this is a nonhealing venous stasis ulcer. Past Medical History:  Diagnosis Date   Anginal pain (HCC)    Cancer (HCC)    skin   COPD (chronic obstructive pulmonary disease) (HCC)    Coronary artery disease    Dyspnea    Dysrhythmia    afib, s/p LAA ligation/MAZE 2013; recurrent aflutter 04/2021   History of hiatal hernia    medication related in 20s   Hx of artificial heart valve replacement    Hypertension    Mitral insufficiency and aortic stenosis    mechanical AVR/MVR 2013 in [place]   Myocardial infarction (HCC)    Pneumonia    as a child   PVD (peripheral vascular disease) (HCC)    Rheumatic heart disease    Stroke [facility])    Past Surgical History:  Procedure Laterality Date   ABDOMINAL AORTOGRAM W/LOWER EXTREMITY Bilateral 04/16/2021   Procedure: ABDOMINAL AORTOGRAM W/LOWER EXTREMITY;  Surgeon: Maeola Harman, MD;  Location: [facility] INVASIVE CV LAB;  Service: Cardiovascular;  Laterality: Bilateral;   AORTIC AND MITRAL VALVE REPLACEMENT  2013   mechanical AVR/MVR in [place]   APPENDECTOMY     APPLICATION OF WOUND VAC Right 09/15/2021   Procedure: APPLICATION OF WOUND  VAC RIGTH LEG;  Surgeon: Leonie Douglas, MD;  Location: [facility] OR;  Service: Vascular;  Laterality: Right;   APPLICATION OF WOUND VAC  10/05/2021   Procedure: APPLICATION OF RIGHT LEG WOUND VAC;  Surgeon: Maeola Harman, MD;  Location: [facility] OR;  Service: Vascular;;   BIOPSY  04/20/2020   Procedure: BIOPSY;  Surgeon: Kerin Salen, MD;  Location: [facility];  Service: Gastroenterology;;   CARDIAC VALVE REPLACEMENT     COLONOSCOPY WITH PROPOFOL N/A 04/20/2020   Procedure: COLONOSCOPY WITH PROPOFOL;  Surgeon: Kerin Salen, MD;  Location: [facility];  Service: Gastroenterology;  Laterality: N/A;   CORONARY ANGIOPLASTY WITH STENT PLACEMENT  10/2017   FEMORAL ARTERY EXPLORATION Right 09/15/2021    Procedure: FEMORAL ARTERY RE EXPLORATION;  Surgeon: Leonie Douglas, MD;  Location: [facility] OR;  Service: Vascular;  Laterality: Right;   FEMORAL-POPLITEAL BYPASS GRAFT Right 09/14/2021   Procedure: RIGHT COMMON FEMORAL-ABOVE KNEE POPLITEAL ARTERY BYPASS WITH VEIN;  Surgeon: Maeola Harman, MD;  Location: [facility] OR;  Service: Vascular;  Laterality: Right;   INCISION AND DRAINAGE OF WOUND Right 10/05/2021   Procedure: RIGHT Marden Noble;  Surgeon: Maeola Harman, MD;  Location: [facility] OR;  Service: Vascular;  Laterality: Right;   LAPAROSCOPIC APPENDECTOMY N/A 05/17/2019   Procedure: INTERVAL LAPAROSCOPIC APPENDECTOMY;  Surgeon: Gaynelle Adu, MD;  Location: [facility] ORS;  Service: General;  Laterality: N/A;   LOWER EXTREMITY ANGIOGRAM Right 09/15/2021   Procedure: RIGHT LOWER EXTREMITY ANGIOGRAM;  Surgeon: Leonie Douglas, MD;  Location: [facility] OR;  Service: Vascular;  Laterality: Right;   MAZE  2013   atrial appendage ligation and maze procedure in 2013   POLYPECTOMY  04/20/2020   Procedure: POLYPECTOMY;  Surgeon: Kerin Salen, MD;  Location: [facility];  Service: Gastroenterology;;   Current Outpatient Medications on File Prior to Visit  Medication Sig Dispense Refill   aspirin EC 81 MG tablet Take 81 mg by mouth daily. Swallow whole.     atorvastatin (LIPITOR) 80 MG tablet Take 80 mg by mouth every evening.      carvedilol (COREG) 6.25 MG tablet Take 6.25 mg by mouth 2 (two) times daily.     cephALEXin (KEFLEX) 500 MG capsule Take 1 capsule (500 mg total) by mouth 2 (two) times daily. 20 capsule 0   enalapril (VASOTEC) 5 MG tablet Take 5 mg by mouth daily.     furosemide (LASIX) 40 MG tablet TAKE 1 TABLET(40 MG) BY MOUTH DAILY 30 tablet 3   silver sulfADIAZINE (SILVADENE) 1 % cream Apply 1 Application topically daily. 50 g 0   warfarin (COUMADIN) 1 MG tablet Take 0.5-1 mg by mouth See admin instructions. Take 1 mg with 6 mg to equal 7 mg every Mon, Wed, and Fri  Take 0.5 mg with the 6 mg to  equal 6.5 mg every Sun, Tues, Thurs, and Sat     warfarin (COUMADIN) 6 MG tablet Take 6 mg by mouth daily.     No current facility-administered medications on file prior to visit.   Not on File Social History   Socioeconomic History   Marital status: Married    Spouse name: Not on file   Number of children: Not on file   Years of education: Not on file   Highest education level: Not on file  Occupational History   Not on file  Tobacco Use   Smoking status: Former    Types: Cigarettes    Passive exposure: Never   Smokeless tobacco: Never  Vaping  Use   Vaping status: Never Used  Substance and Sexual Activity   Alcohol use: Yes    Comment: ocassional   Drug use: Never   Sexual activity: Not on file  Other Topics Concern   Not on file  Social History Narrative   Not on file   Social Determinants of Health   Financial Resource Strain: Not on file  Food Insecurity: Not on file  Transportation Needs: Not on file  Physical Activity: Not on file  Stress: Not on file  Social Connections: Not on file  Intimate Partner Violence: Not on file    Review of Systems  All other systems reviewed and are negative.      Objective:   Physical Exam Constitutional:      General: He is not in acute distress.    Appearance: Normal appearance. He is normal weight. He is not ill-appearing or toxic-appearing.  Cardiovascular:     Rate and Rhythm: Normal rate and regular rhythm.     Heart sounds: Murmur heard.  Pulmonary:     Effort: Pulmonary effort is normal. No respiratory distress.     Breath sounds: Normal breath sounds. No stridor. No wheezing, rhonchi or rales.  Abdominal:     General: Abdomen is flat. Bowel sounds are normal. There is no distension.     Palpations: Abdomen is soft.     Tenderness: There is no abdominal tenderness. There is no guarding.  Musculoskeletal:     Right lower leg: No edema.     Left lower leg: No edema.       Feet:  Skin:    Findings: Lesion  present. No erythema.  Neurological:     Mental Status: He is alert.       Assessment & Plan:  Venous stasis ulcer of right calf with fat layer exposed with varicose veins (HCC) - Plan: Ambulatory referral to Wound Clinic I placed nonadherent gauze over the ulcer covered with  Polysporin.  I then placed the patient in [employer].  I believe the patient needs to see a wound clinic as he has tried and failed conservative dressing changes now for almost 2 months.

## 2023-01-23 ENCOUNTER — Encounter (HOSPITAL_BASED_OUTPATIENT_CLINIC_OR_DEPARTMENT_OTHER): Payer: Medicare Other | Attending: Internal Medicine | Admitting: General Surgery

## 2023-01-23 DIAGNOSIS — I87333 Chronic venous hypertension (idiopathic) with ulcer and inflammation of bilateral lower extremity: Secondary | ICD-10-CM | POA: Diagnosis not present

## 2023-01-23 DIAGNOSIS — I739 Peripheral vascular disease, unspecified: Secondary | ICD-10-CM | POA: Insufficient documentation

## 2023-01-23 DIAGNOSIS — L97822 Non-pressure chronic ulcer of other part of left lower leg with fat layer exposed: Secondary | ICD-10-CM | POA: Diagnosis not present

## 2023-01-23 DIAGNOSIS — L97812 Non-pressure chronic ulcer of other part of right lower leg with fat layer exposed: Secondary | ICD-10-CM | POA: Diagnosis present

## 2023-01-23 NOTE — Progress Notes (Signed)
Gregory [name], BETZER ([id number]) [id number] Page 1 of 14 Visit Report for 01/23/2023 Chief Complaint Document Details Patient Name: Date of Service: Gregory [name] 01/23/2023 9:00 A M Medical Record Number: [id number] Patient Account Number: [id number] Date of Birth/Sex: Treating RN: Aug 10, 1955 (67 y.o. M) Primary Care Provider: Lynnea Ferrier Other Clinician: Referring Provider: Treating Provider/Extender: [name] in Treatment: 0 Information Obtained from: Patient Chief Complaint Patient presents for treatment of open ulcers due to venous insufficiency Electronic Signature(s) Signed: 01/23/2023 10:14:50 AM By: Duanne Guess MD FACS Entered By: Duanne Guess on 01/23/2023 10:14:50 -------------------------------------------------------------------------------- Debridement Details Patient Name: Date of Service: Gregory [name], RO [name] 01/23/2023 9:00 A M Medical Record Number: [id number] Patient Account Number: [id number] Date of Birth/Sex: Treating RN: 1955-07-20 (67 y.o. [name] Primary Care Provider: Lynnea Ferrier Other Clinician: Referring Provider: Treating Provider/Extender: [name] in Treatment: 0 Debridement Performed for Assessment: Wound #2 Left,Lateral Lower Leg Performed By: Physician Duanne Guess, MD The following information was scribed by: Samuella Bruin The information was scribed for: Duanne Guess Debridement Type: Debridement Severity of Tissue Pre Debridement: Fat layer exposed Level of Consciousness (Pre-procedure): Awake and Alert Pre-procedure Verification/Time Out Yes - 09:48 Taken: Start Time: 09:48 Pain Control: Lidocaine 4% T opical Solution Percent of Wound Bed Debrided: 100% T Area Debrided (cm): otal 1.12 Tissue and other material debrided: Non-Viable, Slough, Slough Level: Non-Viable Tissue Debridement Description: Selective/Open  Wound Instrument: Curette Bleeding: Minimum Hemostasis Achieved: Pressure Response to Treatment: Procedure was tolerated well Level of Consciousness (Post- Awake and Alert procedure): Post Debridement Measurements of Total Wound Length: (cm) 1.3 Width: (cm) 1.1 Depth: (cm) 0.1 Volume: (cm) 0.112 Character of Wound/Ulcer Post Debridement: Improved Severity of Tissue Post Debridement: Fat layer exposed Gregory [name] ([id number]) [id number] Page 2 of 14 Post Procedure Diagnosis Same as Pre-procedure Electronic Signature(s) Signed: 01/23/2023 11:18:24 AM By: Duanne Guess MD FACS Signed: 01/23/2023 4:16:20 PM By: Samuella Bruin Entered By: Samuella Bruin on 01/23/2023 09:50:31 -------------------------------------------------------------------------------- Debridement Details Patient Name: Date of Service: Gregory [name], RO [name] 01/23/2023 9:00 A M Medical Record Number: [id number] Patient Account Number: [id number] Date of Birth/Sex: Treating RN: 08/31/1955 (67 y.o. [name] Primary Care Provider: Lynnea Ferrier Other Clinician: Referring Provider: Treating Provider/Extender: [name] in Treatment: 0 Debridement Performed for Assessment: Wound #3 Left,Medial Lower Leg Performed By: Physician Duanne Guess, MD The following information was scribed by: Samuella Bruin The information was scribed for: Duanne Guess Debridement Type: Debridement Severity of Tissue Pre Debridement: Fat layer exposed Level of Consciousness (Pre-procedure): Awake and Alert Pre-procedure Verification/Time Out Yes - 09:48 Taken: Start Time: 09:48 Pain Control: Lidocaine 4% T opical Solution Percent of Wound Bed Debrided: 100% T Area Debrided (cm): otal 0.05 Tissue and other material debrided: Non-Viable, Slough, Slough Level: Non-Viable Tissue Debridement Description: Selective/Open Wound Instrument:  Curette Bleeding: Minimum Hemostasis Achieved: Pressure Response to Treatment: Procedure was tolerated well Level of Consciousness (Post- Awake and Alert procedure): Post Debridement Measurements of Total Wound Length: (cm) 0.3 Width: (cm) 0.2 Depth: (cm) 0.1 Volume: (cm) 0.005 Character of Wound/Ulcer Post Debridement: Improved Severity of Tissue Post Debridement: Fat layer exposed Post Procedure Diagnosis Same as Pre-procedure Electronic Signature(s) Signed: 01/23/2023 11:18:24 AM By: Duanne Guess MD FACS Signed: 01/23/2023 4:16:20 PM By: Samuella Bruin Entered By: Samuella Bruin on 01/23/2023 09:50:55 Debridement Details -------------------------------------------------------------------------------- Gregory [name] ([id number]) [id number] Page 3 of 14 Patient Name: Date of Service: Gregory [name], [place] [name] 01/23/2023 9:00 A M  Medical Record Number: [id number] Patient Account Number: [id number] Date of Birth/Sex: Treating RN: 07-03-1955 (67 y.o. [name] Primary Care Provider: Lynnea Ferrier Other Clinician: Referring Provider: Treating Provider/Extender: [name] in Treatment: 0 Debridement Performed for Assessment: Wound #1 Right,Lateral Lower Leg Performed By: Physician Duanne Guess, MD The following information was scribed by: Samuella Bruin The information was scribed for: Duanne Guess Debridement Type: Debridement Severity of Tissue Pre Debridement: Fat layer exposed Level of Consciousness (Pre-procedure): Awake and Alert Pre-procedure Verification/Time Out Yes - 09:48 Taken: Start Time: 09:48 Pain Control: Lidocaine 4% T opical Solution Percent of Wound Bed Debrided: 100% T Area Debrided (cm): otal 9.42 Tissue and other material debrided: Non-Viable, Slough, Slough Level: Non-Viable Tissue Debridement Description: Selective/Open Wound Instrument: Curette Bleeding:  Minimum Hemostasis Achieved: Pressure Response to Treatment: Procedure was tolerated well Level of Consciousness (Post- Awake and Alert procedure): Post Debridement Measurements of Total Wound Length: (cm) 4 Width: (cm) 3 Depth: (cm) 0.1 Volume: (cm) 0.942 Character of Wound/Ulcer Post Debridement: Improved Severity of Tissue Post Debridement: Fat layer exposed Post Procedure Diagnosis Same as Pre-procedure Electronic Signature(s) Signed: 01/23/2023 11:18:24 AM By: Duanne Guess MD FACS Signed: 01/23/2023 4:16:20 PM By: Samuella Bruin Entered By: Samuella Bruin on 01/23/2023 09:52:10 -------------------------------------------------------------------------------- HPI Details Patient Name: Date of Service: Gregory [name], RO [name] 01/23/2023 9:00 A M Medical Record Number: [id number] Patient Account Number: [id number] Date of Birth/Sex: Treating RN: 1955-12-29 (67 y.o. M) Primary Care Provider: Lynnea Ferrier Other Clinician: Referring Provider: Treating Provider/Extender: [name] in Treatment: 0 History of Present Illness HPI Description: ADMISSION 01/23/2023 ***VASCULAR STUDIES:*** ABIs 11/2021: +-------+-----------+-----------+------------+------------+ ABI/TBIT oday's ABIT oday's TBIPrevious ABIPrevious TBI +-------+-----------+-----------+------------+------------+ Right 1.08 0.35 0.82 0.29  +-------+-----------+-----------+------------+------------+ Left 0.97 0.27 0.88 0.27  Ecklund, Albino ([id number]) [phone].pdf Page 4 of 14 +-------+-----------+-----------+------------+------------+ Venous reflux 03/2021 Venous Reflux Times +--------------+---------+------+-----------+------------+--------+ RIGHT Reflux NoRefluxReflux TimeDiameter cmsComments    Yes     +--------------+---------+------+-----------+------------+--------+ CFV   yes  >1 second     +--------------+---------+------+-----------+------------+--------+ FV prox   yes  >1 second    +--------------+---------+------+-----------+------------+--------+ FV mid   yes  >1 second    +--------------+---------+------+-----------+------------+--------+ FV dist   yes  >1 second    +--------------+---------+------+-----------+------------+--------+ Popliteal   yes  >1 second    +--------------+---------+------+-----------+------------+--------+ GSV at SFJ   yes  >500 ms  0.736   +--------------+---------+------+-----------+------------+--------+ GSV prox thigh  yes  >500 ms  0.591   +--------------+---------+------+-----------+------------+--------+ GSV mid thigh   yes  >500 ms  0.591   +--------------+---------+------+-----------+------------+--------+ GSV dist thigh  yes  >500 ms  0.546   +--------------+---------+------+-----------+------------+--------+ GSV at knee no    0.592   +--------------+---------+------+-----------+------------+--------+ GSV prox calf     0.441   +--------------+---------+------+-----------+------------+--------+ GSV mid calf     0.461   +--------------+---------+------+-----------+------------+--------+ SSV Pop Fossa no    0.406   +--------------+---------+------+-----------+------------+--------+ SSV prox calf   yes  >500 ms  0.379   +--------------+---------+------+-----------+------------+--------+ SSV mid calf     0.379   +--------------+---------+------+-----------+------------+--------+ Summary: Right: - No evidence of deep vein thrombosis seen in the right lower extremity, from the common femoral through the popliteal veins. - No evidence of superficial venous thrombosis in the right lower extremity. - Venous reflux is noted in the right common femoral vein. - Venous reflux is noted in the right sapheno-femoral junction. - Venous reflux is  noted in the right greater saphenous vein in the thigh. - Venous reflux is noted in the right femoral vein. - Venous reflux is  noted in the right popliteal vein. - Venous reflux is noted in the right short saphenous vein. This is a 67 year old smoker with a significant history of cardiac and peripheral vascular disease. He has had issues with venous ulceration on his right leg that have resolved with local care in the past, but the most recent wound has been present for a couple of months and has not healed despite treatment with Silvadene cream and home application of Unna boots. He also has developed 2 small ulcers on the left that have presented within the past couple of weeks. Of note, his right greater saphenous vein was harvested in 2023 for femoral above-knee popliteal bypass. He was referred here by his primary care provider for further evaluation and management of his lower extremity venous ulcers. Electronic Signature(s) Signed: 01/23/2023 10:17:52 AM By: Duanne Guess MD FACS Previous Signature: 01/23/2023 9:43:44 AM Version By: Duanne Guess MD FACS Entered By: Duanne Guess on 01/23/2023 10:17:52 -------------------------------------------------------------------------------- Physical Exam Details Patient Name: Date of Service: Gregory [name], RO [name] 01/23/2023 9:00 A M Medical Record Number: [id number] Patient Account Number: [id number] Date of Birth/Sex: Treating RN: 11/14/1955 (67 y.o. M) Primary Care Provider: Lynnea Ferrier Other Clinician: Referring Provider: Treating Provider/Extender: [name] in Treatment: 0 Constitutional . . . . No acute distress. GRAY, AFFLECK ([id number]) [id number] Page 5 of 14 Respiratory Normal work of breathing on room air. Cardiovascular Extensive hemosiderin deposition bilaterally. His legs are hairless. He does have extensive old tattoos that contribute to the  discoloration.. Notes 01/23/2023: On his right lateral lower leg, there is a circular ulcer with slough present. There is some blue-black discoloration from tattoo ink. There is no erythema, induration, or purulent drainage to suggest infection. He has a similar, but smaller ulcer on the left lateral lower leg, also stained with tattoo ink. There is slough on the surface here and no evidence of infection. He has a tiny medial left lower leg ulcer with some slough present. Electronic Signature(s) Signed: 01/23/2023 10:21:28 AM By: Duanne Guess MD FACS Entered By: Duanne Guess on 01/23/2023 10:21:28 -------------------------------------------------------------------------------- Physician Orders Details Patient Name: Date of Service: Gregory [name], RO [name] 01/23/2023 9:00 A M Medical Record Number: [id number] Patient Account Number: [id number] Date of Birth/Sex: Treating RN: 10/03/1955 (67 y.o. [name] Primary Care Provider: Lynnea Ferrier Other Clinician: Referring Provider: Treating Provider/Extender: [name] in Treatment: 0 Verbal / Phone Orders: No Diagnosis Coding ICD-10 Coding Code Description [phone] Non-pressure chronic ulcer of other part of right lower leg with fat layer exposed L97.822 Non-pressure chronic ulcer of other part of left lower leg with fat layer exposed I87.333 Chronic venous hypertension (idiopathic) with ulcer and inflammation of bilateral lower extremity I73.9 Peripheral vascular disease, unspecified Follow-up Appointments ppointment in 1 week. - Dr. Lady Gary - room 2 Return A Anesthetic (In clinic) Topical Lidocaine 4% applied to wound bed Bathing/ Shower/ Hygiene May shower with protection but do not get wound dressing(s) wet. Protect dressing(s) with water repellant cover (for example, large plastic bag) or a cast cover and may then take shower. Edema Control - Lymphedema / SCD / Other Elevate legs to the  level of the heart or above for 30 minutes daily and/or when sitting for 3-4 times a day throughout the day. A void standing for long periods of time. If compression wraps slide down please call wound center and speak with a nurse. Additional Orders / Instructions Follow Nutritious Diet - vitamin C 500 mg  start once a day and if tolerated then twice a day, zinc 30-50 mg once daily, protein shakes daily Wound Treatment Wound #1 - Lower Leg Wound Laterality: Right, Lateral Cleanser: Soap and Water 1 x Per Week/30 Days Discharge Instructions: May shower and wash wound with dial antibacterial soap and water prior to dressing change. Cleanser: Wound Cleanser 1 x Per Week/30 Days Discharge Instructions: Cleanse the wound with wound cleanser prior to applying a clean dressing using gauze sponges, not tissue or cotton balls. Peri-Wound Care: Sween Lotion (Moisturizing lotion) 1 x Per Week/30 Days Discharge Instructions: Apply moisturizing lotion as directed Prim Dressing: Maxorb Extra Ag+ Alginate Dressing, 2x2 (in/in) 1 x Per Week/30 Days ary Discharge Instructions: Apply to wound bed as instructed Secondary Dressing: ABD Pad, 5x9 1 x Per Week/30 Days LIOR, CWIKLINSKI ([id number]) [phone].pdf Page 6 of 14 Discharge Instructions: Apply over primary dressing as directed. Compression Wrap: Urgo K2 Lite, (equivalent to a 3 layer) two layer compression system, regular 1 x Per Week/30 Days Discharge Instructions: Apply Urgo K2 Lite as directed (alternative to 3 layer compression). Compression Stockings: Circaid Juxta Lite Compression Wrap (DME) Right Leg Compression Amount: 30-40 mmHG Discharge Instructions: Apply Circaid Juxta Lite Compression Wrap daily as instructed. Apply first thing in the morning, remove at night before bed. Wound #2 - Lower Leg Wound Laterality: Left, Lateral Cleanser: Soap and Water 1 x Per Week/30 Days Discharge Instructions: May shower and wash wound  with dial antibacterial soap and water prior to dressing change. Cleanser: Wound Cleanser 1 x Per Week/30 Days Discharge Instructions: Cleanse the wound with wound cleanser prior to applying a clean dressing using gauze sponges, not tissue or cotton balls. Peri-Wound Care: Sween Lotion (Moisturizing lotion) 1 x Per Week/30 Days Discharge Instructions: Apply moisturizing lotion as directed Prim Dressing: Maxorb Extra Ag+ Alginate Dressing, 2x2 (in/in) 1 x Per Week/30 Days ary Discharge Instructions: Apply to wound bed as instructed Secondary Dressing: ABD Pad, 5x9 1 x Per Week/30 Days Discharge Instructions: Apply over primary dressing as directed. Compression Wrap: Urgo K2 Lite, (equivalent to a 3 layer) two layer compression system, regular (DME) (Generic) 1 x Per Week/30 Days Discharge Instructions: Apply Urgo K2 Lite as directed (alternative to 3 layer compression). Compression Stockings: Circaid Juxta Lite Compression Wrap Left Leg Compression Amount: 30-40 mmHG Discharge Instructions: Apply Circaid Juxta Lite Compression Wrap daily as instructed. Apply first thing in the morning, remove at night before bed. Wound #3 - Lower Leg Wound Laterality: Left, Medial Cleanser: Soap and Water 1 x Per Week/30 Days Discharge Instructions: May shower and wash wound with dial antibacterial soap and water prior to dressing change. Cleanser: Wound Cleanser 1 x Per Week/30 Days Discharge Instructions: Cleanse the wound with wound cleanser prior to applying a clean dressing using gauze sponges, not tissue or cotton balls. Peri-Wound Care: Sween Lotion (Moisturizing lotion) 1 x Per Week/30 Days Discharge Instructions: Apply moisturizing lotion as directed Prim Dressing: Maxorb Extra Ag+ Alginate Dressing, 2x2 (in/in) 1 x Per Week/30 Days ary Discharge Instructions: Apply to wound bed as instructed Secondary Dressing: ABD Pad, 5x9 1 x Per Week/30 Days Discharge Instructions: Apply over primary dressing  as directed. Compression Wrap: Urgo K2 Lite, (equivalent to a 3 layer) two layer compression system, regular 1 x Per Week/30 Days Discharge Instructions: Apply Urgo K2 Lite as directed (alternative to 3 layer compression). Patient Medications llergies: No Known Allergies A Notifications Medication Indication Start End 01/23/2023 lidocaine DOSE topical 4 % cream - cream topical Electronic Signature(s) Signed:  01/23/2023 2:35:35 PM By: Duanne Guess MD FACS Signed: 01/23/2023 4:16:20 PM By: Samuella Bruin Previous Signature: 01/23/2023 11:18:24 AM Version By: Duanne Guess MD FACS Entered By: Samuella Bruin on 01/23/2023 13:36:32 Gregory [name] ([id number]) [id number] Page 7 of 14 -------------------------------------------------------------------------------- Problem List Details Patient Name: Date of Service: Gregory [name] 01/23/2023 9:00 A M Medical Record Number: [id number] Patient Account Number: [id number] Date of Birth/Sex: Treating RN: [birth year] (67 y.o. M) Primary Care Provider: Lynnea Ferrier Other Clinician: Referring Provider: Treating Provider/Extender: [name] in Treatment: 0 Active Problems ICD-10 Encounter Code Description Active Date MDM Diagnosis L97.812 Non-pressure chronic ulcer of other part of right lower leg with fat layer 01/23/2023 No Yes exposed L97.822 Non-pressure chronic ulcer of other part of left lower leg with fat layer 01/23/2023 No Yes exposed I87.333 Chronic venous hypertension (idiopathic) with ulcer and inflammation of 01/23/2023 No Yes bilateral lower extremity I73.9 Peripheral vascular disease, unspecified 01/23/2023 No Yes Inactive Problems Resolved Problems Electronic Signature(s) Signed: 01/23/2023 10:14:20 AM By: Duanne Guess MD FACS Previous Signature: 01/23/2023 9:20:53 AM Version By: Duanne Guess MD FACS Entered By: Duanne Guess on  01/23/2023 10:14:19 -------------------------------------------------------------------------------- Progress Note Details Patient Name: Date of Service: Gregory [name], RO [name] 01/23/2023 9:00 A M Medical Record Number: [id number] Patient Account Number: [id number] Date of Birth/Sex: Treating RN: October 08, 1955 (67 y.o. M) Primary Care Provider: Lynnea Ferrier Other Clinician: Referring Provider: Treating Provider/Extender: [name] in Treatment: 0 Subjective Chief Complaint Information obtained from Patient Patient presents for treatment of open ulcers due to venous insufficiency History of Present Illness (HPI) ADMISSION 01/23/2023 Gregory [name] ([id number]) [id number] Page 8 of 14 ***VASCULAR STUDIES:*** ABIs 11/2021: +-------+-----------+-----------+------------+------------+ ABI/TBIT oday's ABIT oday's TBIPrevious ABIPrevious TBI +-------+-----------+-----------+------------+------------+ Right 1.08 0.35 0.82 0.29  +-------+-----------+-----------+------------+------------+ Left 0.97 0.27 0.88 0.27  +-------+-----------+-----------+------------+------------+ Venous reflux 03/2021 Venous Reflux Times +--------------+---------+------+-----------+------------+--------+ RIGHT Reflux NoRefluxReflux TimeDiameter cmsComments    Yes     +--------------+---------+------+-----------+------------+--------+ CFV   yes  >1 second    +--------------+---------+------+-----------+------------+--------+ FV prox   yes  >1 second    +--------------+---------+------+-----------+------------+--------+ FV mid   yes  >1 second    +--------------+---------+------+-----------+------------+--------+ FV dist   yes  >1 second    +--------------+---------+------+-----------+------------+--------+ Popliteal   yes  >1 second     +--------------+---------+------+-----------+------------+--------+ GSV at SFJ   yes  >500 ms  0.736   +--------------+---------+------+-----------+------------+--------+ GSV prox thigh  yes  >500 ms  0.591   +--------------+---------+------+-----------+------------+--------+ GSV mid thigh   yes  >500 ms  0.591   +--------------+---------+------+-----------+------------+--------+ GSV dist thigh  yes  >500 ms  0.546   +--------------+---------+------+-----------+------------+--------+ GSV at knee no    0.592   +--------------+---------+------+-----------+------------+--------+ GSV prox calf     0.441   +--------------+---------+------+-----------+------------+--------+ GSV mid calf     0.461   +--------------+---------+------+-----------+------------+--------+ SSV Pop Fossa no    0.406   +--------------+---------+------+-----------+------------+--------+ SSV prox calf   yes  >500 ms  0.379   +--------------+---------+------+-----------+------------+--------+ SSV mid calf     0.379   +--------------+---------+------+-----------+------------+--------+ Summary: Right: - No evidence of deep vein thrombosis seen in the right lower extremity, from the common femoral through the popliteal veins. - No evidence of superficial venous thrombosis in the right lower extremity. - Venous reflux is noted in the right common femoral vein. - Venous reflux is noted in the right sapheno-femoral junction. - Venous reflux is noted in the right greater saphenous vein in the thigh. - Venous reflux is noted in the right femoral vein. - Venous reflux is  noted in the right popliteal vein. - Venous reflux is noted in the right short saphenous vein. This is a 67 year old smoker with a significant history of cardiac and peripheral vascular disease. He has had issues with venous ulceration on his right leg that have resolved with local care  in the past, but the most recent wound has been present for a couple of months and has not healed despite treatment with Silvadene cream and home application of Unna boots. He also has developed 2 small ulcers on the left that have presented within the past couple of weeks. Of note, his right greater saphenous vein was harvested in 2023 for femoral above-knee popliteal bypass. He was referred here by his primary care provider for further evaluation and management of his lower extremity venous ulcers. Patient History Information obtained from Patient, Chart. Allergies No Known Allergies Family History Cancer - Mother,Child, Diabetes - Maternal Grandparents, Heart Disease - Father, Lung Disease - Mother, Stroke - Father, No family history of Hereditary Spherocytosis, Hypertension, Kidney Disease, Seizures, Thyroid Problems, Tuberculosis. Social History Current every day smoker - 1/2 ppd, Marital Status - Married, Alcohol Use - Rarely, Drug Use - No History, Caffeine Use - Moderate. Medical History Respiratory Patient has history of Chronic Obstructive Pulmonary Disease (COPD) Cardiovascular Patient has history of Angina, Arrhythmia - a-fib, Coronary Artery Disease, Hypertension, Myocardial Infarction, Peripheral Venous Disease Hospitalization/Surgery History - Incision and drainage of wound (Right). - Application of wound vac. - Femoral artery exploration (Right). - Lower extremity angiogram (Right). - Femoral-popliteal bypass graft (Right). - Abdominal aortogram w/lower extremity (Bilateral). - Colonoscopy with propofol. - Polypectomy. Kristina Schriver, Molly Maduro ([id number]) [id number] Page 9 of 14 Laparoscopic appendectomy. - Coronary angioplasty with stent placement. - Aortic and mitral valve replacement. - Appendectomy. - Cardiac valve replacement. Medical A Surgical History Notes nd Cardiovascular rheumatic heart disease, Hx of artificial heart valve replacement, Mitral  insufficiency and aortic stenosis Gastrointestinal hiatal hernia Neurologic stroke Oncologic skin cancer Review of Systems (ROS) Constitutional Symptoms (General Health) Denies complaints or symptoms of Fatigue, Fever, Chills, Marked Weight Change. Eyes Complains or has symptoms of Glasses / Contacts. Ear/Nose/Mouth/Throat Denies complaints or symptoms of Chronic sinus problems or rhinitis. Endocrine Denies complaints or symptoms of Heat/cold intolerance. Genitourinary Denies complaints or symptoms of Frequent urination. Integumentary (Skin) Complains or has symptoms of Wounds. Musculoskeletal Denies complaints or symptoms of Muscle Pain, Muscle Weakness. Psychiatric Denies complaints or symptoms of Claustrophobia. Objective Constitutional No acute distress. Vitals Time Taken: 9:05 AM, Height: 68 in, Weight: 208 lbs, BMI: 31.6, Temperature: 97.6 F, Pulse: 74 bpm, Respiratory Rate: 20 breaths/min, Blood Pressure: 137/88 mmHg. Respiratory Normal work of breathing on room air. Cardiovascular Extensive hemosiderin deposition bilaterally. His legs are hairless. He does have extensive old tattoos that contribute to the discoloration.. General Notes: 01/23/2023: On his right lateral lower leg, there is a circular ulcer with slough present. There is some blue-black discoloration from tattoo ink. There is no erythema, induration, or purulent drainage to suggest infection. He has a similar, but smaller ulcer on the left lateral lower leg, also stained with tattoo ink. There is slough on the surface here and no evidence of infection. He has a tiny medial left lower leg ulcer with some slough present. Integumentary (Hair, Skin) Wound #1 status is Open. Original cause of wound was Blister. The date acquired was: 10/31/2022. The wound is located on the Right,Lateral Lower Leg. The wound measures 4cm length x 3cm width x 0.1cm depth; 9.425cm^2 area and 0.942cm^3 volume. There  is Fat Layer  (Subcutaneous Tissue) exposed. There is no tunneling or undermining noted. There is a medium amount of serosanguineous drainage noted. The wound margin is distinct with the outline attached to the wound base. There is small (1-33%) red granulation within the wound bed. There is a large (67-100%) amount of necrotic tissue within the wound bed including Eschar and Adherent Slough. The periwound skin appearance had no abnormalities noted for texture. The periwound skin appearance had no abnormalities noted for moisture. The periwound skin appearance exhibited: Hemosiderin Staining. Periwound temperature was noted as No Abnormality. Wound #2 status is Open. Original cause of wound was Gradually Appeared. The date acquired was: 01/13/2023. The wound is located on the Left,Lateral Lower Leg. The wound measures 1.3cm length x 1.1cm width x 0.1cm depth; 1.123cm^2 area and 0.112cm^3 volume. There is Fat Layer (Subcutaneous Tissue) exposed. There is no tunneling or undermining noted. There is a medium amount of serosanguineous drainage noted. The wound margin is distinct with the outline attached to the wound base. There is small (1-33%) red granulation within the wound bed. There is a large (67-100%) amount of necrotic tissue within the wound bed including Eschar and Adherent Slough. The periwound skin appearance had no abnormalities noted for texture. The periwound skin appearance had no abnormalities noted for moisture. The periwound skin appearance exhibited: Hemosiderin Staining. Periwound temperature was noted as No Abnormality. Wound #3 status is Open. Original cause of wound was Gradually Appeared. The date acquired was: 01/16/2023. The wound is located on the Left,Medial Lower Leg. The wound measures 0.3cm length x 0.2cm width x 0.1cm depth; 0.047cm^2 area and 0.005cm^3 volume. There is Fat Layer (Subcutaneous Tissue) exposed. There is no tunneling or undermining noted. There is a medium amount of  serosanguineous drainage noted. The wound margin is distinct with the outline attached to the wound base. There is medium (34-66%) red granulation within the wound bed. There is a medium (34-66%) amount of necrotic tissue within the wound bed including Adherent Slough. The periwound skin appearance had no abnormalities noted for texture. The periwound skin appearance had no abnormalities noted for moisture. The periwound skin appearance exhibited: Hemosiderin Staining. Periwound temperature was noted as No Abnormality. Assessment Active Problems ICD-10 Non-pressure chronic ulcer of other part of right lower leg with fat layer exposed Non-pressure chronic ulcer of other part of left lower leg with fat layer exposed Gregory [name] ([id number]) [phone].pdf Page 10 of 14 Chronic venous hypertension (idiopathic) with ulcer and inflammation of bilateral lower extremity Peripheral vascular disease, unspecified Procedures Wound #1 Pre-procedure diagnosis of Wound #1 is a Venous Leg Ulcer located on the Right,Lateral Lower Leg .Severity of Tissue Pre Debridement is: Fat layer exposed. There was a Selective/Open Wound Non-Viable Tissue Debridement with a total area of 9.42 sq cm performed by Duanne Guess, MD. With the following instrument(s): Curette to remove Non-Viable tissue/material. Material removed includes [facility] after achieving pain control using Lidocaine 4% Topical Solution. No specimens were taken. A time out was conducted at 09:48, prior to the start of the procedure. A Minimum amount of bleeding was controlled with Pressure. The procedure was tolerated well. Post Debridement Measurements: 4cm length x 3cm width x 0.1cm depth; 0.942cm^3 volume. Character of Wound/Ulcer Post Debridement is improved. Severity of Tissue Post Debridement is: Fat layer exposed. Post procedure Diagnosis Wound #1: Same as Pre-Procedure Pre-procedure diagnosis of Wound #1 is a Venous Leg  Ulcer located on the Right,Lateral Lower Leg . There was a Double Layer Compression Therapy Procedure by  Samuella Bruin, RN. Post procedure Diagnosis Wound #1: Same as Pre-Procedure Wound #2 Pre-procedure diagnosis of Wound #2 is a Venous Leg Ulcer located on the Left,Lateral Lower Leg .Severity of Tissue Pre Debridement is: Fat layer exposed. There was a Selective/Open Wound Non-Viable Tissue Debridement with a total area of 1.12 sq cm performed by Duanne Guess, MD. With the following instrument(s): Curette to remove Non-Viable tissue/material. Material removed includes [facility] after achieving pain control using Lidocaine 4% Topical Solution. No specimens were taken. A time out was conducted at 09:48, prior to the start of the procedure. A Minimum amount of bleeding was controlled with Pressure. The procedure was tolerated well. Post Debridement Measurements: 1.3cm length x 1.1cm width x 0.1cm depth; 0.112cm^3 volume. Character of Wound/Ulcer Post Debridement is improved. Severity of Tissue Post Debridement is: Fat layer exposed. Post procedure Diagnosis Wound #2: Same as Pre-Procedure Pre-procedure diagnosis of Wound #2 is a Venous Leg Ulcer located on the Left,Lateral Lower Leg . There was a Double Layer Compression Therapy Procedure by Samuella Bruin, RN. Post procedure Diagnosis Wound #2: Same as Pre-Procedure Wound #3 Pre-procedure diagnosis of Wound #3 is a Venous Leg Ulcer located on the Left,Medial Lower Leg .Severity of Tissue Pre Debridement is: Fat layer exposed. There was a Selective/Open Wound Non-Viable Tissue Debridement with a total area of 0.05 sq cm performed by Duanne Guess, MD. With the following instrument(s): Curette to remove Non-Viable tissue/material. Material removed includes [facility] after achieving pain control using Lidocaine 4% Topical Solution. No specimens were taken. A time out was conducted at 09:48, prior to the start of the procedure. A Minimum  amount of bleeding was controlled with Pressure. The procedure was tolerated well. Post Debridement Measurements: 0.3cm length x 0.2cm width x 0.1cm depth; 0.005cm^3 volume. Character of Wound/Ulcer Post Debridement is improved. Severity of Tissue Post Debridement is: Fat layer exposed. Post procedure Diagnosis Wound #3: Same as Pre-Procedure Plan Follow-up Appointments: Return Appointment in 1 week. - Dr. Lady Gary - room 2 Anesthetic: (In clinic) Topical Lidocaine 4% applied to wound bed Bathing/ Shower/ Hygiene: May shower with protection but do not get wound dressing(s) wet. Protect dressing(s) with water repellant cover (for example, large plastic bag) or a cast cover and may then take shower. Edema Control - Lymphedema / SCD / Other: Elevate legs to the level of the heart or above for 30 minutes daily and/or when sitting for 3-4 times a day throughout the day. Avoid standing for long periods of time. If compression wraps slide down please call wound center and speak with a nurse. Additional Orders / Instructions: Follow Nutritious Diet - vitamin C 500 mg start once a day and if tolerated then twice a day, zinc 30-50 mg once daily, protein shakes daily The following medication(s) was prescribed: lidocaine topical 4 % cream cream topical was prescribed at facility WOUND #1: - Lower Leg Wound Laterality: Right, Lateral Cleanser: Soap and Water 1 x Per Week/30 Days Discharge Instructions: May shower and wash wound with dial antibacterial soap and water prior to dressing change. Cleanser: Wound Cleanser 1 x Per Week/30 Days Discharge Instructions: Cleanse the wound with wound cleanser prior to applying a clean dressing using gauze sponges, not tissue or cotton balls. Peri-Wound Care: Sween Lotion (Moisturizing lotion) 1 x Per Week/30 Days Discharge Instructions: Apply moisturizing lotion as directed Prim Dressing: Maxorb Extra Ag+ Alginate Dressing, 2x2 (in/in) 1 x Per Week/30  Days ary Discharge Instructions: Apply to wound bed as instructed Secondary Dressing: ABD Pad, 5x9 1 x  Per Week/30 Days Discharge Instructions: Apply over primary dressing as directed. Com pression Wrap: Urgo K2 Lite, (equivalent to a 3 layer) two layer compression system, regular 1 x Per Week/30 Days Discharge Instructions: Apply Urgo K2 Lite as directed (alternative to 3 layer compression). Com pression Stockings: Circaid Juxta Lite Compression Wrap (DME) Compression Amount: 30-40 mmHg (right) Discharge Instructions: Apply Circaid Juxta Lite Compression Wrap daily as instructed. Apply first thing in the morning, remove at night before bed. WOUND #2: - Lower Leg Wound Laterality: Left, Lateral Cleanser: Soap and Water 1 x Per Week/30 Days NICKIE, LOLLEY ([id number]) [id number] Page 11 of 14 Discharge Instructions: May shower and wash wound with dial antibacterial soap and water prior to dressing change. Cleanser: Wound Cleanser 1 x Per Week/30 Days Discharge Instructions: Cleanse the wound with wound cleanser prior to applying a clean dressing using gauze sponges, not tissue or cotton balls. Peri-Wound Care: Sween Lotion (Moisturizing lotion) 1 x Per Week/30 Days Discharge Instructions: Apply moisturizing lotion as directed Prim Dressing: Maxorb Extra Ag+ Alginate Dressing, 2x2 (in/in) 1 x Per Week/30 Days ary Discharge Instructions: Apply to wound bed as instructed Secondary Dressing: ABD Pad, 5x9 1 x Per Week/30 Days Discharge Instructions: Apply over primary dressing as directed. Com pression Wrap: Urgo K2 Lite, (equivalent to a 3 layer) two layer compression system, regular (DME) (Generic) 1 x Per Week/30 Days Discharge Instructions: Apply Urgo K2 Lite as directed (alternative to 3 layer compression). Com pression Stockings: Circaid Juxta Lite Compression Wrap Compression Amount: 30-40 mmHg (left) Discharge Instructions: Apply Circaid Juxta Lite  Compression Wrap daily as instructed. Apply first thing in the morning, remove at night before bed. WOUND #3: - Lower Leg Wound Laterality: Left, Medial Cleanser: Soap and Water 1 x Per Week/30 Days Discharge Instructions: May shower and wash wound with dial antibacterial soap and water prior to dressing change. Cleanser: Wound Cleanser 1 x Per Week/30 Days Discharge Instructions: Cleanse the wound with wound cleanser prior to applying a clean dressing using gauze sponges, not tissue or cotton balls. Peri-Wound Care: Sween Lotion (Moisturizing lotion) 1 x Per Week/30 Days Discharge Instructions: Apply moisturizing lotion as directed Prim Dressing: Maxorb Extra Ag+ Alginate Dressing, 2x2 (in/in) 1 x Per Week/30 Days ary Discharge Instructions: Apply to wound bed as instructed Secondary Dressing: ABD Pad, 5x9 1 x Per Week/30 Days Discharge Instructions: Apply over primary dressing as directed. Com pression Wrap: Urgo K2 Lite, (equivalent to a 3 layer) two layer compression system, regular 1 x Per Week/30 Days Discharge Instructions: Apply Urgo K2 Lite as directed (alternative to 3 layer compression). 01/23/2023: This is a 67 year old man with an extensive cardiac and peripheral vascular history presenting to clinic today with bilateral venous ulcers. He continues to smoke. On his right lateral lower leg, there is a circular ulcer with slough present. There is some blue-black discoloration from tattoo ink. There is no erythema, induration, or purulent drainage to suggest infection. He has a similar, but smaller ulcer on the left lateral lower leg, also stained with tattoo ink. There is slough on the surface here and no evidence of infection. He has a tiny medial left lower leg ulcer with some slough present. I used a curette to debride slough and biofilm from each of his wounds. We will apply silver alginate to each site with bilateral Urgo lite compression wraps. He was reminded of the importance  of keeping his legs elevated as much as possible during the day and at night while he is sleeping. We  discussed the need for adequate protein intake and recommended that he start taking both vitamin C and zinc supplements. We addressed the effects that nicotine has on wound healing. He will follow-up in 1 week. Electronic Signature(s) Signed: 01/23/2023 2:35:35 PM By: Duanne Guess MD FACS Signed: 01/23/2023 4:16:20 PM By: Samuella Bruin Previous Signature: 01/23/2023 10:32:28 AM Version By: Duanne Guess MD FACS Previous Signature: 01/23/2023 10:22:14 AM Version By: Duanne Guess MD FACS Entered By: Samuella Bruin on 01/23/2023 13:36:55 -------------------------------------------------------------------------------- HxROS Details Patient Name: Date of Service: Gregory [name], RO [name] 01/23/2023 9:00 A M Medical Record Number: [id number] Patient Account Number: [id number] Date of Birth/Sex: Treating RN: 17-May-1955 (67 y.o. [name] Primary Care Provider: Lynnea Ferrier Other Clinician: Referring Provider: Treating Provider/Extender: [name] in Treatment: 0 Information Obtained From Patient Chart Constitutional Symptoms (General Health) Complaints and Symptoms: Negative for: Fatigue; Fever; Chills; Marked Weight Change Eyes Complaints and Symptoms: Positive for: Glasses / Contacts Ear/Nose/Mouth/Throat Complaints and Symptoms: OLIVIA, BAGOT ([id number]) [phone].pdf Page 12 of 14 Negative for: Chronic sinus problems or rhinitis Endocrine Complaints and Symptoms: Negative for: Heat/cold intolerance Genitourinary Complaints and Symptoms: Negative for: Frequent urination Integumentary (Skin) Complaints and Symptoms: Positive for: Wounds Musculoskeletal Complaints and Symptoms: Negative for: Muscle Pain; Muscle Weakness Psychiatric Complaints and Symptoms: Negative for:  Claustrophobia Hematologic/Lymphatic Respiratory Medical History: Positive for: Chronic Obstructive Pulmonary Disease (COPD) Cardiovascular Medical History: Positive for: Angina; Arrhythmia - a-fib; Coronary Artery Disease; Hypertension; Myocardial Infarction; Peripheral Venous Disease Past Medical History Notes: rheumatic heart disease, Hx of artificial heart valve replacement, Mitral insufficiency and aortic stenosis Gastrointestinal Medical History: Past Medical History Notes: hiatal hernia Immunological Neurologic Medical History: Past Medical History Notes: stroke Oncologic Medical History: Past Medical History Notes: skin cancer Immunizations Pneumococcal Vaccine: Received Pneumococcal Vaccination: Yes Received Pneumococcal Vaccination On or After 60th Birthday: Yes Implantable Devices None Hospitalization / Surgery History Type of Hospitalization/Surgery Incision and drainage of wound (Right) Application of wound vac Femoral artery exploration (Right) Lower extremity angiogram (Right) ALICE, BADENHOP ([id number]) [id number] Page 13 of 14 Femoral-popliteal bypass graft (Right) Abdominal aortogram w/lower extremity (Bilateral) Colonoscopy with propofol Polypectomy Laparoscopic appendectomy Coronary angioplasty with stent placement Aortic and mitral valve replacement Appendectomy Cardiac valve replacement Family and Social History Cancer: Yes - Mother,Child; Diabetes: Yes - Maternal Grandparents; Heart Disease: Yes - Father; Hereditary Spherocytosis: No; Hypertension: No; Kidney Disease: No; Lung Disease: Yes - Mother; Seizures: No; Stroke: Yes - Father; Thyroid Problems: No; Tuberculosis: No; Current every day smoker - 1/2 ppd; Marital Status - Married; Alcohol Use: Rarely; Drug Use: No History; Caffeine Use: Moderate; Financial Concerns: No; Food, Clothing or Shelter Needs: No; Support System Lacking: No; Transportation Concerns:  No Electronic Signature(s) Signed: 01/23/2023 11:18:24 AM By: Duanne Guess MD FACS Signed: 01/23/2023 4:16:20 PM By: Samuella Bruin Entered By: Samuella Bruin on 01/23/2023 09:32:02 -------------------------------------------------------------------------------- SuperBill Details Patient Name: Date of Service: Gregory [name], RO [name] 01/23/2023 Medical Record Number: [id number] Patient Account Number: [id number] Date of Birth/Sex: Treating RN: 1955-09-30 (67 y.o. M) Primary Care Provider: Lynnea Ferrier Other Clinician: Referring Provider: Treating Provider/Extender: [name] in Treatment: 0 Diagnosis Coding ICD-10 Codes Code Description [phone] Non-pressure chronic ulcer of other part of right lower leg with fat layer exposed L97.822 Non-pressure chronic ulcer of other part of left lower leg with fat layer exposed I87.333 Chronic venous hypertension (idiopathic) with ulcer and inflammation of bilateral lower extremity I73.9 Peripheral vascular disease, unspecified Facility Procedures : CPT4 Code: [id number] Description: 81829 -  WOUND CARE VISIT-LEV 3 EST PT Modifier: 25 Quantity: 1 : CPT4 Code: [id number] Description: 97597 - DEBRIDE WOUND 1ST 20 SQ CM OR < ICD-10 Diagnosis Description L97.812 Non-pressure chronic ulcer of other part of right lower leg with fat layer exposed L97.822 Non-pressure chronic ulcer of other part of left lower leg with fat layer  exposed Modifier: Quantity: 1 Physician Procedures : CPT4 Code Description Modifier [id number] - WC PHYS LEVEL 4 - NEW PT 25 ICD-10 Diagnosis Description L97.812 Non-pressure chronic ulcer of other part of right lower leg with fat layer exposed L97.822 Non-pressure chronic ulcer of other part of left  lower leg with fat layer exposed I87.333 Chronic venous hypertension (idiopathic) with ulcer and inflammation of bilateral lower extremity I73.9 Peripheral vascular disease,  unspecified Quantity: 1 : [id number] 97597 - WC PHYS DEBR WO ANESTH 20 SQ CM ICD-10 Diagnosis Description L97.812 Non-pressure chronic ulcer of other part of right lower leg with fat layer exposed L97.822 Non-pressure chronic ulcer of other part of left lower leg with fat layer  exposed Gregory [name] ([id number]) [id number].pdf P Quantity: 1 age 11 of 39 Electronic Signature(s) Signed: 01/23/2023 11:18:24 AM By: Duanne Guess MD FACS Signed: 01/23/2023 4:16:20 PM By: Samuella Bruin Previous Signature: 01/23/2023 10:32:51 AM Version By: Duanne Guess MD FACS Entered By: Samuella Bruin on 01/23/2023 11:14:52

## 2023-01-23 NOTE — Progress Notes (Signed)
[name], [name] ([id number]) [id number] Page 1 of 4 Visit Report for 01/23/2023 Abuse Risk Screen Details Patient Name: Date of Service: [name] 01/23/2023 9:00 A M Medical Record Number: [id number] Patient Account Number: [id number] Date of Birth/Sex: Treating RN: 25-May-1955 (67 y.o. [name] Primary Care Dalvin Clipper: Lynnea Ferrier Other Clinician: Referring Jaeveon Ashland: Treating Retha Bither/Extender: [name] in Treatment: 0 Abuse Risk Screen Items Answer ABUSE RISK SCREEN: Has anyone close to you tried to hurt or harm you recentlyo No Do you feel uncomfortable with anyone in your familyo No Has anyone forced you do things that you didnt want to doo No Electronic Signature(s) Signed: 01/23/2023 4:16:20 PM By: Samuella Bruin Entered By: Samuella Bruin on 01/23/2023 09:32:07 -------------------------------------------------------------------------------- Activities of Daily Living Details Patient Name: Date of Service: [name] 01/23/2023 9:00 A M Medical Record Number: [id number] Patient Account Number: [id number] Date of Birth/Sex: Treating RN: July 25, 1955 (67 y.o. [name] Primary Care Dmarius Reeder: Lynnea Ferrier Other Clinician: Referring Irven Ingalsbe: Treating Anevay Campanella/Extender: [name] in Treatment: 0 Activities of Daily Living Items Answer Activities of Daily Living (Please select one for each item) Drive Automobile Completely Able T Medications ake Completely Able Use T elephone Completely Able Care for Appearance Completely Able Use T oilet Completely Able Bath / Shower Completely Able Dress Self Completely Able Feed Self Completely Able Walk Completely Able Get In / Out Bed Completely Able Housework Completely Able Prepare Meals Completely Able Handle Money Completely Able Shop for Self Completely Able Electronic  Signature(s) Signed: 01/23/2023 4:16:20 PM By: Samuella Bruin Entered By: Samuella Bruin on 01/23/2023 09:32:20 [name] ([id number]) [id number] Page 2 of 4 -------------------------------------------------------------------------------- Education Screening Details Patient Name: Date of Service: [name] 01/23/2023 9:00 A M Medical Record Number: [id number] Patient Account Number: [id number] Date of Birth/Sex: Treating RN: 1955-08-12 (67 y.o. [name] Primary Care Kinston Magnan: Lynnea Ferrier Other Clinician: Referring Lashon Beringer: Treating Trevontae Lindahl/Extender: [name] in Treatment: 0 Primary Learner Assessed: Patient Learning Preferences/Education Level/Primary Language Learning Preference: Explanation, Demonstration, Video, Printed Material Highest Education Level: High School Preferred Language: English Cognitive Barrier Language Barrier: No Translator Needed: No Memory Deficit: No Emotional Barrier: No Cultural/Religious Beliefs Affecting Medical Care: No Physical Barrier Impaired Vision: Yes Glasses Impaired Hearing: No Decreased Hand dexterity: No Knowledge/Comprehension Knowledge Level: Medium Comprehension Level: Medium Ability to understand written instructions: Medium Ability to understand verbal instructions: Medium Motivation Anxiety Level: Calm Cooperation: Cooperative Education Importance: Acknowledges Need Interest in Health Problems: Asks Questions Perception: Coherent Willingness to Engage in Self-Management Medium Activities: Readiness to Engage in Self-Management Medium Activities: Electronic Signature(s) Signed: 01/23/2023 4:16:20 PM By: Samuella Bruin Entered By: Samuella Bruin on 01/23/2023 09:32:41 -------------------------------------------------------------------------------- Fall Risk Assessment Details Patient Name: Date of Service: [name], [name] 01/23/2023 9:00 A M Medical Record Number: [id number] Patient Account Number: [id number] Date of Birth/Sex: Treating RN: October 22, 1955 (66 y.o. [name] Primary Care Chasitty Hehl: Lynnea Ferrier Other Clinician: Referring Demetrias Goodbar: Treating Audriana Aldama/Extender: [name] in Treatment: 0 Fall Risk Assessment Items Have you had 2 or more falls in the last 12 monthso 0 No Carnation, Zamier ([id number]) [phone] [id number] Page 3 of 4 Have you had any fall that resulted in injury in the last 12 monthso 0 No FALLS RISK SCREEN History of falling - immediate or within 3 months 0 No Secondary diagnosis (Do you have 2 or more medical diagnoseso) 15 Yes Ambulatory aid  None/bed rest/wheelchair/nurse 0 Yes Crutches/cane/walker 0 No Furniture 0 No Intravenous therapy Access/Saline/Heparin Lock 0 No Gait/Transferring Normal/ bed rest/ wheelchair 0 Yes Weak (short steps with or without shuffle, stooped but able to lift head while walking, may seek 0 No support from furniture) Impaired (short steps with shuffle, may have difficulty arising from chair, head down, impaired 0 No balance) Mental Status Oriented to own ability 0 Yes Electronic Signature(s) Signed: 01/23/2023 4:16:20 PM By: Samuella Bruin Entered By: Samuella Bruin on 01/23/2023 09:32:51 -------------------------------------------------------------------------------- Foot Assessment Details Patient Name: Date of Service: [name], [name] 01/23/2023 9:00 A M Medical Record Number: [id number] Patient Account Number: [id number] Date of Birth/Sex: Treating RN: December 25, 1955 (67 y.o. [name] Primary Care Henri Guedes: Lynnea Ferrier Other Clinician: Referring Shaily Librizzi: Treating Lillyrose Reitan/Extender: [name] in Treatment: 0 Foot Assessment Items Site Locations + = Sensation present, - = Sensation absent, C = Callus, U =  Ulcer R = Redness, W = Warmth, M = Maceration, PU = Pre-ulcerative lesion F = Fissure, S = Swelling, D = Dryness Assessment Right: Left: Other Deformity: No No Prior Foot Ulcer: No No Prior Amputation: No No Charcot Joint: No No Ambulatory Status: Ambulatory Without Help GaitSEQUOIA, [name] ([id number]) [id number] Page 4 of 4 Notes no hx of diabetes or neuropathy Electronic Signature(s) Signed: 01/23/2023 4:16:20 PM By: Samuella Bruin Entered By: Samuella Bruin on 01/23/2023 09:33:22 -------------------------------------------------------------------------------- Nutrition Risk Screening Details Patient Name: Date of Service: [name] 01/23/2023 9:00 A M Medical Record Number: [id number] Patient Account Number: [id number] Date of Birth/Sex: Treating RN: 04/25/1955 (67 y.o. [name] Primary Care Kerria Sapien: Lynnea Ferrier Other Clinician: Referring Emylia Latella: Treating Sansa Alkema/Extender: [name] in Treatment: 0 Height (in): 68 Weight (lbs): 208 Body Mass Index (BMI): 31.6 Nutrition Risk Screening Items Score Screening NUTRITION RISK SCREEN: I have an illness or condition that made me change the kind and/or amount of food I eat 0 No I eat fewer than two meals per day 0 No I eat few fruits and vegetables, or milk products 0 No I have three or more drinks of beer, liquor or wine almost every day 0 No I have tooth or mouth problems that make it hard for me to eat 0 No I don't always have enough money to buy the food I need 0 No I eat alone most of the time 0 No I take three or more different prescribed or over-the-counter drugs a day 1 Yes Without wanting to, I have lost or gained 10 pounds in the last six months 0 No I am not always physically able to shop, cook and/or feed myself 0 No Nutrition Protocols Good Risk Protocol 0 No interventions needed Moderate Risk Protocol High  Risk Proctocol Risk Level: Good Risk Score: 1 Electronic Signature(s) Signed: 01/23/2023 4:16:20 PM By: Samuella Bruin Entered By: Samuella Bruin on 01/23/2023 09:33:05

## 2023-01-23 NOTE — Progress Notes (Addendum)
KINAN, GIACHETTI ([id number]) [id number] Page 1 of 12 Visit Report for 01/23/2023 Allergy List Details Patient Name: Date of Service: [name] 01/23/2023 9:00 A M Medical Record Number: [id number] Patient Account Number: [id number] Date of Birth/Sex: Treating RN: 1955-09-13 (67 y.o. [name] Primary Care Antero Derosia: Lynnea Ferrier Other Clinician: Referring Eyvette Cordon: Treating Darwin Rothlisberger/Extender: Matthew Folks Weeks in Treatment: 0 Allergies Active Allergies No Known Allergies Allergy Notes Electronic Signature(s) Signed: 01/23/2023 4:16:20 PM By: Samuella Bruin Entered By: Samuella Bruin on 01/23/2023 09:28:24 -------------------------------------------------------------------------------- Arrival Information Details Patient Name: Date of Service: [name], [name] 01/23/2023 9:00 A M Medical Record Number: [id number] Patient Account Number: [id number] Date of Birth/Sex: Treating RN: [birth year] (67 y.o. M) Primary Care Tyrea Froberg: Lynnea Ferrier Other Clinician: Referring Tarika Mckethan: Treating Yianni Skilling/Extender: [name] in Treatment: 0 Visit Information Patient Arrived: Ambulatory Arrival Time: 09:05 Accompanied By: wife Transfer Assistance: None Patient Identification Verified: Yes Secondary Verification Process Completed: Yes Electronic Signature(s) Signed: 01/23/2023 9:17:49 AM By: Dayton Scrape Entered By: Dayton Scrape on 01/23/2023 09:05:46 -------------------------------------------------------------------------------- Clinic Level of Care Assessment Details Patient Name: Date of Service: [name] 01/23/2023 9:00 A M Medical Record Number: [id number] Patient Account Number: [id number] Date of Birth/Sex: Treating RN: [birth year] (67 y.o. [name] Primary Care Daoud Lobue: Lynnea Ferrier Other Clinician: [name] ([id number])  [id number] Page 2 of 12 Referring Sterlin Knightly: Treating Assunta Pupo/Extender: [name] in Treatment: 0 Clinic Level of Care Assessment Items TOOL 1 Quantity Score X- 1 0 Use when EandM and Procedure is performed on INITIAL visit ASSESSMENTS - Nursing Assessment / Reassessment X- 1 20 General Physical Exam (combine w/ comprehensive assessment (listed just below) when performed on new pt. evals) X- 1 25 Comprehensive Assessment (HX, ROS, Risk Assessments, Wounds Hx, etc.) ASSESSMENTS - Wound and Skin Assessment / Reassessment []  - 0 Dermatologic / Skin Assessment (not related to wound area) ASSESSMENTS - Ostomy and/or Continence Assessment and Care []  - 0 Incontinence Assessment and Management []  - 0 Ostomy Care Assessment and Management (repouching, etc.) PROCESS - Coordination of Care X - Simple Patient / Family Education for ongoing care 1 15 []  - 0 Complex (extensive) Patient / Family Education for ongoing care X- 1 10 Staff obtains Chiropractor, Records, T Results / Process Orders est []  - 0 Staff telephones HHA, Nursing Homes / Clarify orders / etc []  - 0 Routine Transfer to another Facility (non-emergent condition) []  - 0 Routine Hospital Admission (non-emergent condition) X- 1 15 New Admissions / Manufacturing engineer / Ordering NPWT Apligraf, etc. , []  - 0 Emergency Hospital Admission (emergent condition) PROCESS - Special Needs []  - 0 Pediatric / Minor Patient Management []  - 0 Isolation Patient Management []  - 0 Hearing / Language / Visual special needs []  - 0 Assessment of Community assistance (transportation, D/C planning, etc.) []  - 0 Additional assistance / Altered mentation []  - 0 Support Surface(s) Assessment (bed, cushion, seat, etc.) INTERVENTIONS - Miscellaneous []  - 0 External ear exam []  - 0 Patient Transfer (multiple staff / Nurse, adult / Similar devices) []  - 0 Simple Staple / Suture  removal (25 or less) []  - 0 Complex Staple / Suture removal (26 or more) []  - 0 Hypo/Hyperglycemic Management (do not check if billed separately) []  - 0 Ankle / Brachial Index (ABI) - do not check if billed separately Has the patient been seen at the hospital within the last three years: Yes Total Score: 85 Level Of Care:  New/Established - Level 3 Electronic Signature(s) Signed: 01/23/2023 4:16:20 PM By: Samuella Bruin Entered By: Samuella Bruin on 01/23/2023 09:58:05 Compression Therapy Details -------------------------------------------------------------------------------- [name] ([id number]) [id number] Page 3 of 12 Patient Name: Date of Service: [name] 01/23/2023 9:00 A M Medical Record Number: [id number] Patient Account Number: [id number] Date of Birth/Sex: Treating RN: Jul 03, 1955 (67 y.o. [name] Primary Care Gilbert Narain: Lynnea Ferrier Other Clinician: Referring Twana Wileman: Treating Coralyn Roselli/Extender: [name] in Treatment: 0 Compression Therapy Performed for Wound Assessment: Wound #1 Right,Lateral Lower Leg Performed By: Clinician Samuella Bruin, RN Compression Type: Double Layer Post Procedure Diagnosis Same as Pre-procedure Electronic Signature(s) Signed: 01/23/2023 4:16:20 PM By: Gelene Mink By: Samuella Bruin on 01/23/2023 09:54:38 -------------------------------------------------------------------------------- Compression Therapy Details Patient Name: Date of Service: [name], [name] 01/23/2023 9:00 A M Medical Record Number: [id number] Patient Account Number: [id number] Date of Birth/Sex: Treating RN: 1955-06-01 (67 y.o. [name] Primary Care Nivedita Mirabella: Lynnea Ferrier Other Clinician: Referring Sussan Meter: Treating Estha Few/Extender: [name] in Treatment: 0 Compression Therapy Performed for Wound  Assessment: Wound #2 Left,Lateral Lower Leg Performed By: Clinician Samuella Bruin, RN Compression Type: Double Layer Post Procedure Diagnosis Same as Pre-procedure Electronic Signature(s) Signed: 01/23/2023 4:16:20 PM By: Gelene Mink By: Samuella Bruin on 01/23/2023 09:54:38 -------------------------------------------------------------------------------- Encounter Discharge Information Details Patient Name: Date of Service: [name], [name] 01/23/2023 9:00 A M Medical Record Number: [id number] Patient Account Number: [id number] Date of Birth/Sex: Treating RN: 01-23-1956 (67 y.o. [name] Primary Care Alzora Ha: Lynnea Ferrier Other Clinician: Referring Jahnessa Vanduyn: Treating Emersyn Wyss/Extender: [name] in Treatment: 0 Encounter Discharge Information Items Post Procedure Vitals Discharge Condition: Stable Temperature (F): 97.6 Ambulatory Status: Ambulatory Pulse (bpm): 74 Discharge Destination: Home Respiratory Rate (breaths/min): 20 Transportation: Private Auto Blood Pressure (mmHg): 137/88 Accompanied By: wife Schedule Follow-up Appointment: Yes Clinical Summary of Care: Patient Declined Electronic Signature(s) Signed: 01/23/2023 4:16:20 PM By: Anson Fret, Signed: 01/23/2023 4:16:20 PM By: Alveda Reasons ([id number]) [id number] Page 4 of 12 aylor Entered By: Samuella Bruin on 01/23/2023 09:59:20 -------------------------------------------------------------------------------- Lower Extremity Assessment Details Patient Name: Date of Service: [name] 01/23/2023 9:00 A M Medical Record Number: [id number] Patient Account Number: [id number] Date of Birth/Sex: Treating RN: 12/22/1955 (67 y.o. [name] Primary Care Aala Ransom: Lynnea Ferrier Other Clinician: Referring Chandra Asher: Treating Sadia Belfiore/Extender: [name]  in Treatment: 0 Edema Assessment Assessed: [name]: No] [Right: No] [Left: Edema] [Right: :] Calf Left: Right: Point of Measurement: From Medial Instep 40.2 cm 39 cm Ankle Left: Right: Point of Measurement: From Medial Instep 24.2 cm 23.5 cm Knee To Floor Left: Right: From Medial Instep 43 cm 43 cm Vascular Assessment Pulses: Dorsalis Pedis Palpable: [Left:Yes] [Right:Yes] Extremity colors, hair growth, and conditions: Extremity Color: [Left:Hyperpigmented] [Right:Hyperpigmented] Hair Growth on Extremity: [Left:No] [Right:No] Temperature of Extremity: [Left:Cool] [Right:Cool] Capillary Refill: [Left:< 3 seconds] [Right:< 3 seconds] Dependent Rubor: [Left:No] [Right:No] Blanched when Elevated: [Left:No No] [Right:No No] Toe Nail Assessment Left: Right: Thick: No No Discolored: No No Deformed: No No Improper Length and Hygiene: No No Electronic Signature(s) Signed: 01/23/2023 4:16:20 PM By: Samuella Bruin Entered By: Samuella Bruin on 01/23/2023 13:36:04 -------------------------------------------------------------------------------- Multi Wound Chart Details Patient Name: Date of Service: [name], [name] 01/23/2023 9:00 A M Medical Record Number: [id number] Patient Account Number: [id number] AUL, DONABEDIAN ([id number]) [id number].pdf Page 5 of 12 Date of Birth/Sex: Treating RN: January 18, 1956 (67 y.o. M) Primary Care Melton Walls:  Other Clinician: Lynnea Ferrier Referring Monice Lundy: Treating Lashika Erker/Extender: [name] in Treatment: 0 Vital Signs Height(in): 68 Pulse(bpm): 74 Weight(lbs): 208 Blood Pressure(mmHg): 137/88 Body Mass Index(BMI): 31.6 Temperature(F): 97.6 Respiratory Rate(breaths/min): 20 [1:Photos:] [3:No Photos] Right, Lateral Lower Leg Left, Lateral Lower Leg Left, Medial Lower Leg Wound Location: Blister Gradually Appeared Gradually Appeared Wounding Event: T be determined o Arterial  Insufficiency Ulcer Arterial Insufficiency Ulcer Primary Etiology: Chronic Obstructive Pulmonary Chronic Obstructive Pulmonary Chronic Obstructive Pulmonary Comorbid History: Disease (COPD), Angina, Arrhythmia, Disease (COPD), Angina, Arrhythmia, Disease (COPD), Angina, Arrhythmia, Coronary Artery Disease, Coronary Artery Disease, Coronary Artery Disease, Hypertension, Myocardial Infarction, Hypertension, Myocardial Infarction, Hypertension, Myocardial Infarction, Peripheral Venous Disease Peripheral Venous Disease Peripheral Venous Disease 10/31/2022 01/13/2023 01/16/2023 Date Acquired: 0 0 0 Weeks of Treatment: Open Open Open Wound Status: No No No Wound Recurrence: 4x3x0.1 1.3x1.1x0.1 0.3x0.2x0.1 Measurements L x W x D (cm) 9.425 1.123 0.047 A (cm) : rea 0.942 0.112 0.005 Volume (cm) : Full Thickness Without Exposed Full Thickness Without Exposed Full Thickness Without Exposed Classification: Support Structures Support Structures Support Structures Medium Medium Medium Exudate A mount: Serosanguineous Serosanguineous Serosanguineous Exudate Type: red, brown red, brown red, brown Exudate Color: Distinct, outline attached Distinct, outline attached Distinct, outline attached Wound Margin: Small (1-33%) Small (1-33%) Medium (34-66%) Granulation A mount: Red Red Red Granulation Quality: Large (67-100%) Large (67-100%) Medium (34-66%) Necrotic A mount: Eschar, Adherent Slough Eschar, Adherent Slough [employer] Necrotic Tissue: Fat Layer (Subcutaneous Tissue): Yes Fat Layer (Subcutaneous Tissue): Yes Fat Layer (Subcutaneous Tissue): Yes Exposed Structures: Fascia: No Fascia: No Fascia: No Tendon: No Tendon: No Tendon: No Muscle: No Muscle: No Muscle: No Joint: No Joint: No Joint: No Bone: No Bone: No Bone: No None None None Epithelialization: Debridement - Selective/Open Wound Debridement - Selective/Open Wound Debridement - Selective/Open  Wound Debridement: Pre-procedure Verification/Time Out 09:48 09:48 09:48 Taken: Lidocaine 4% Topical Solution Lidocaine 4% Topical Solution Lidocaine 4% Topical Solution Pain Control: [employer] Tissue Debrided: Non-Viable Tissue Non-Viable Tissue Non-Viable Tissue Level: 9.42 1.12 0.05 Debridement A (sq cm): rea Curette Curette Curette Instrument: Minimum Minimum Minimum Bleeding: Pressure Pressure Pressure Hemostasis A chieved: Procedure was tolerated well Procedure was tolerated well Procedure was tolerated well Debridement Treatment Response: 4x3x0.1 1.3x1.1x0.1 0.3x0.2x0.1 Post Debridement Measurements L x W x D (cm) 0.942 0.112 0.005 Post Debridement Volume: (cm) No Abnormalities Noted No Abnormalities Noted No Abnormalities Noted Periwound Skin Texture: No Abnormalities Noted No Abnormalities Noted No Abnormalities Noted Periwound Skin Moisture: Hemosiderin Staining: Yes Hemosiderin Staining: Yes Hemosiderin Staining: Yes Periwound Skin Color: No Abnormality No Abnormality No Abnormality Temperature: Compression Therapy Compression Therapy Debridement Procedures Performed: Debridement Debridement Treatment Notes Wound #1 (Lower Leg) Wound Laterality: Right, Lateral MARCO, GAUGHAN ([id number]) [id number] Page 6 of 12 Soap and Water Discharge Instruction: May shower and wash wound with dial antibacterial soap and water prior to dressing change. Wound Cleanser Discharge Instruction: Cleanse the wound with wound cleanser prior to applying a clean dressing using gauze sponges, not tissue or cotton balls. Peri-Wound Care Sween Lotion (Moisturizing lotion) Discharge Instruction: Apply moisturizing lotion as directed Topical Primary Dressing Maxorb Extra Ag+ Alginate Dressing, 2x2 (in/in) Discharge Instruction: Apply to wound bed as instructed Secondary Dressing ABD Pad, 5x9 Discharge Instruction: Apply over primary  dressing as directed. Secured With Compression Wrap Urgo K2 Lite, (equivalent to a 3 layer) two layer compression system, regular Discharge Instruction: Apply Urgo K2 Lite as directed (alternative to 3 layer compression). Compression Stockings Add-Ons Wound #2 (Lower Leg)  Wound Laterality: Left, Lateral Cleanser Soap and Water Discharge Instruction: May shower and wash wound with dial antibacterial soap and water prior to dressing change. Wound Cleanser Discharge Instruction: Cleanse the wound with wound cleanser prior to applying a clean dressing using gauze sponges, not tissue or cotton balls. Peri-Wound Care Sween Lotion (Moisturizing lotion) Discharge Instruction: Apply moisturizing lotion as directed Topical Primary Dressing Maxorb Extra Ag+ Alginate Dressing, 2x2 (in/in) Discharge Instruction: Apply to wound bed as instructed Secondary Dressing ABD Pad, 5x9 Discharge Instruction: Apply over primary dressing as directed. Secured With Compression Wrap Urgo K2 Lite, (equivalent to a 3 layer) two layer compression system, regular Discharge Instruction: Apply Urgo K2 Lite as directed (alternative to 3 layer compression). Compression Stockings Add-Ons Wound #3 (Lower Leg) Wound Laterality: Left, Medial Cleanser Soap and Water Discharge Instruction: May shower and wash wound with dial antibacterial soap and water prior to dressing change. Wound Cleanser Discharge Instruction: Cleanse the wound with wound cleanser prior to applying a clean dressing using gauze sponges, not tissue or cotton balls. Peri-Wound Care Sween Lotion (Moisturizing lotion) Discharge Instruction: Apply moisturizing lotion as directed Topical NIK, HERSHMAN ([id number]) [id number] Page 7 of 12 Primary Dressing Maxorb Extra Ag+ Alginate Dressing, 2x2 (in/in) Discharge Instruction: Apply to wound bed as instructed Secondary Dressing ABD Pad, 5x9 Discharge Instruction: Apply over  primary dressing as directed. Secured With Compression Wrap Urgo K2 Lite, (equivalent to a 3 layer) two layer compression system, regular Discharge Instruction: Apply Urgo K2 Lite as directed (alternative to 3 layer compression). Compression Stockings Add-Ons Electronic Signature(s) Signed: 01/23/2023 10:14:33 AM By: Duanne Guess MD FACS Entered By: Duanne Guess on 01/23/2023 10:14:33 -------------------------------------------------------------------------------- Multi-Disciplinary Care Plan Details Patient Name: Date of Service: [name], [name] 01/23/2023 9:00 A M Medical Record Number: [id number] Patient Account Number: [id number] Date of Birth/Sex: Treating RN: [birth year] (67 y.o. [name] Primary Care Braylyn Eye: Lynnea Ferrier Other Clinician: Referring Arli Bree: Treating Clydine Parkison/Extender: [name] in Treatment: 0 Active Inactive Venous Leg Ulcer Nursing Diagnoses: Actual venous Insuffiency (use after diagnosis is confirmed) Knowledge deficit related to disease process and management Goals: Patient will maintain optimal edema control Date Initiated: 01/23/2023 Target Resolution Date: 03/07/2023 Goal Status: Active Patient/caregiver will verbalize understanding of disease process and disease management Date Initiated: 01/23/2023 Target Resolution Date: 03/07/2023 Goal Status: Active Interventions: Assess peripheral edema status every visit. Compression as ordered Provide education on venous insufficiency Treatment Activities: Therapeutic compression applied : 01/23/2023 Notes: Wound/Skin Impairment Nursing Diagnoses: Impaired tissue integrity Knowledge deficit related to ulceration/compromised skin integrity Goals: Patient/caregiver will verbalize understanding of skin care regimen SHMIEL, RIGG ([id number]) [phone].pdf Page 8 of 12 Date Initiated: 01/23/2023 Target Resolution Date:  03/07/2023 Goal Status: Active Interventions: Assess ulceration(s) every visit Treatment Activities: Skin care regimen initiated : 01/23/2023 Topical wound management initiated : 01/23/2023 Notes: Electronic Signature(s) Signed: 01/23/2023 4:16:20 PM By: Samuella Bruin Entered By: Samuella Bruin on 01/23/2023 09:58:39 -------------------------------------------------------------------------------- Pain Assessment Details Patient Name: Date of Service: [name], [name] 01/23/2023 9:00 A M Medical Record Number: [id number] Patient Account Number: [id number] Date of Birth/Sex: Treating RN: 10/18/1955 (67 y.o. M) Primary Care Ted Leonhart: Lynnea Ferrier Other Clinician: Referring Achillies Buehl: Treating Reka Wist/Extender: [name] in Treatment: 0 Active Problems Location of Pain Severity and Description of Pain Patient Has Paino Yes Site Locations Rate the pain. Current Pain Level: 5 Worst Pain Level: 10 Least Pain Level: 0 Tolerable Pain Level: 1 Character of Pain Describe the Pain: Stabbing, Throbbing Pain Management  and Medication Current Pain Management: Electronic Signature(s) Signed: 01/23/2023 9:17:49 AM By: Dayton Scrape Entered By: Dayton Scrape on 01/23/2023 09:07:45 [name] ([id number]) [id number] Page 9 of 12 -------------------------------------------------------------------------------- Patient/Caregiver Education Details Patient Name: Date of Service: [name], [name] 10/24/2024andnbsp9:00 A M Medical Record Number: [id number] Patient Account Number: [id number] Date of Birth/Gender: Treating RN: 11-13-1955 (67 y.o. [name] Primary Care Physician: Lynnea Ferrier Other Clinician: Referring Physician: Treating Physician/Extender: [name] in Treatment: 0 Education Assessment Education Provided To: Patient Education Topics Provided Smoking and Wound  Healing: Methods: Explain/Verbal Responses: Reinforcements needed, State content correctly Wound Debridement: Methods: Explain/Verbal Responses: Reinforcements needed, State content correctly Wound/Skin Impairment: Methods: Explain/Verbal Responses: Reinforcements needed, State content correctly Electronic Signature(s) Signed: 01/23/2023 4:16:20 PM By: Samuella Bruin Entered By: Samuella Bruin on 01/23/2023 09:57:38 -------------------------------------------------------------------------------- Wound Assessment Details Patient Name: Date of Service: [name], [name] 01/23/2023 9:00 A M Medical Record Number: [id number] Patient Account Number: [id number] Date of Birth/Sex: Treating RN: 01-Aug-1955 (67 y.o. M) Primary Care Shawon Denzer: Lynnea Ferrier Other Clinician: Referring Nafisa Olds: Treating Tawfiq Favila/Extender: [name] in Treatment: 0 Wound Status Wound Number: 1 Primary T be determined o Etiology: Wound Location: Right, Lateral Lower Leg Wound Open Wounding Event: Blister Status: Date Acquired: 10/31/2022 Comorbid Chronic Obstructive Pulmonary Disease (COPD), Angina, Weeks Of Treatment: 0 History: Arrhythmia, Coronary Artery Disease, Hypertension, Myocardial Clustered Wound: No Infarction, Peripheral Venous Disease Photos Wound Measurements CLAYT, VONADA ([id number]) Length: (cm) 4 Width: (cm) 3 Depth: (cm) 0.1 Area: (cm) 9.425 Volume: (cm) 0.942 [id number] Page 10 of 12 % Reduction in Area: % Reduction in Volume: Epithelialization: None Tunneling: No Undermining: No Wound Description Classification: Full Thickness Without Exposed Support Wound Margin: Distinct, outline attached Exudate Amount: Medium Exudate Type: Serosanguineous Exudate Color: red, brown Structures Foul Odor After Cleansing: No Slough/Fibrino Yes Wound Bed Granulation Amount: Small (1-33%) Exposed Structure Granulation  Quality: Red Fascia Exposed: No Necrotic Amount: Large (67-100%) Fat Layer (Subcutaneous Tissue) Exposed: Yes Necrotic Quality: Eschar, Adherent Slough Tendon Exposed: No Muscle Exposed: No Joint Exposed: No Bone Exposed: No Periwound Skin Texture Texture Color No Abnormalities Noted: Yes No Abnormalities Noted: No Hemosiderin Staining: Yes Moisture No Abnormalities Noted: Yes Temperature / Pain Temperature: No Abnormality Electronic Signature(s) Signed: 01/23/2023 4:16:20 PM By: Samuella Bruin Previous Signature: 01/23/2023 9:17:49 AM Version By: Dayton Scrape Entered By: Samuella Bruin on 01/23/2023 09:37:20 -------------------------------------------------------------------------------- Wound Assessment Details Patient Name: Date of Service: [name], [name] 01/23/2023 9:00 A M Medical Record Number: [id number] Patient Account Number: [id number] Date of Birth/Sex: Treating RN: [birth year] (67 y.o. [name] Primary Care Winn Muehl: Lynnea Ferrier Other Clinician: Referring Taila Basinski: Treating Cottrell Gentles/Extender: [name] in Treatment: 0 Wound Status Wound Number: 2 Primary Arterial Insufficiency Ulcer Etiology: Wound Location: Left, Lateral Lower Leg Wound Open Wounding Event: Gradually Appeared Status: Date Acquired: 01/13/2023 Comorbid Chronic Obstructive Pulmonary Disease (COPD), Angina, Weeks Of Treatment: 0 History: Arrhythmia, Coronary Artery Disease, Hypertension, Myocardial Clustered Wound: No Infarction, Peripheral Venous Disease Photos ADRAIN, GIACOMO ([id number]) [id number] Page 11 of 12 Wound Measurements Length: (cm) 1.3 Width: (cm) 1.1 Depth: (cm) 0.1 Area: (cm) 1.123 Volume: (cm) 0.112 % Reduction in Area: % Reduction in Volume: Epithelialization: None Tunneling: No Undermining: No Wound Description Classification: Full Thickness Without Exposed Support Wound Margin:  Distinct, outline attached Exudate Amount: Medium Exudate Type: Serosanguineous Exudate Color: red, brown Structures Foul Odor After Cleansing: No Slough/Fibrino Yes Wound Bed Granulation Amount: Small (1-33%) Exposed Structure Granulation  Quality: Red Fascia Exposed: No Necrotic Amount: Large (67-100%) Fat Layer (Subcutaneous Tissue) Exposed: Yes Necrotic Quality: Eschar, Adherent Slough Tendon Exposed: No Muscle Exposed: No Joint Exposed: No Bone Exposed: No Periwound Skin Texture Texture Color No Abnormalities Noted: Yes No Abnormalities Noted: No Hemosiderin Staining: Yes Moisture No Abnormalities Noted: Yes Temperature / Pain Temperature: No Abnormality Electronic Signature(s) Signed: 01/23/2023 4:16:20 PM By: Samuella Bruin Previous Signature: 01/23/2023 9:17:49 AM Version By: Dayton Scrape Entered By: Samuella Bruin on 01/23/2023 09:37:49 -------------------------------------------------------------------------------- Wound Assessment Details Patient Name: Date of Service: [name], [name] 01/23/2023 9:00 A M Medical Record Number: [id number] Patient Account Number: [id number] Date of Birth/Sex: Treating RN: 06/29/1955 (67 y.o. [name] Primary Care Itzayana Pardy: Lynnea Ferrier Other Clinician: Referring Pyper Olexa: Treating Tonilynn Bieker/Extender: [name] in Treatment: 0 Wound Status Wound Number: 3 Primary Arterial Insufficiency Ulcer Etiology: Wound Location: Left, Medial Lower Leg Wound Open Wounding Event: Gradually Appeared Status: Date Acquired: 01/16/2023 Comorbid Chronic Obstructive Pulmonary Disease (COPD), Angina, Weeks Of Treatment: 0 History: Arrhythmia, Coronary Artery Disease, Hypertension, Myocardial Clustered Wound: No Infarction, Peripheral Venous Disease Wound Measurements Length: (cm) 0.3 Width: (cm) 0.2 Depth: (cm) 0.1 Area: (cm) 0.047 Volume: (cm) 0.005 % Reduction in Area: % Reduction in  Volume: Epithelialization: None Tunneling: No Undermining: No Wound Description Classification: Full Thickness Without Exposed Support Structures Wound Margin: Distinct, outline attached Exudate Amount: Medium Exudate Type: Serosanguineous Fleury, Nicolis ([id number]) Exudate Color: red, brown Foul Odor After Cleansing: No Slough/Fibrino Yes [id number] Page 12 of 12 Wound Bed Granulation Amount: Medium (34-66%) Exposed Structure Granulation Quality: Red Fascia Exposed: No Necrotic Amount: Medium (34-66%) Fat Layer (Subcutaneous Tissue) Exposed: Yes Necrotic Quality: Adherent Slough Tendon Exposed: No Muscle Exposed: No Joint Exposed: No Bone Exposed: No Periwound Skin Texture Texture Color No Abnormalities Noted: Yes No Abnormalities Noted: No Hemosiderin Staining: Yes Moisture No Abnormalities Noted: Yes Temperature / Pain Temperature: No Abnormality Electronic Signature(s) Signed: 01/23/2023 4:16:20 PM By: Samuella Bruin Entered By: Samuella Bruin on 01/23/2023 09:40:21 -------------------------------------------------------------------------------- Vitals Details Patient Name: Date of Service: [name], [name] 01/23/2023 9:00 A M Medical Record Number: [id number] Patient Account Number: [id number] Date of Birth/Sex: Treating RN: [birth year] (67 y.o. M) Primary Care Emileo Semel: Lynnea Ferrier Other Clinician: Referring Eola Waldrep: Treating Zeppelin Commisso/Extender: [name] in Treatment: 0 Vital Signs Time Taken: 09:05 Temperature (F): 97.6 Height (in): 68 Pulse (bpm): 74 Weight (lbs): 208 Respiratory Rate (breaths/min): 20 Body Mass Index (BMI): 31.6 Blood Pressure (mmHg): 137/88 Reference Range: 80 - 120 mg / dl Electronic Signature(s) Signed: 01/23/2023 9:17:49 AM By: Dayton Scrape Entered By: Dayton Scrape on 01/23/2023 09:07:17

## 2023-01-31 ENCOUNTER — Encounter (HOSPITAL_BASED_OUTPATIENT_CLINIC_OR_DEPARTMENT_OTHER): Payer: Medicare Other | Attending: General Surgery | Admitting: General Surgery

## 2023-01-31 DIAGNOSIS — I4891 Unspecified atrial fibrillation: Secondary | ICD-10-CM | POA: Insufficient documentation

## 2023-01-31 DIAGNOSIS — L97822 Non-pressure chronic ulcer of other part of left lower leg with fat layer exposed: Secondary | ICD-10-CM | POA: Diagnosis not present

## 2023-01-31 DIAGNOSIS — I25119 Atherosclerotic heart disease of native coronary artery with unspecified angina pectoris: Secondary | ICD-10-CM | POA: Diagnosis not present

## 2023-01-31 DIAGNOSIS — L97812 Non-pressure chronic ulcer of other part of right lower leg with fat layer exposed: Secondary | ICD-10-CM | POA: Diagnosis present

## 2023-01-31 DIAGNOSIS — I10 Essential (primary) hypertension: Secondary | ICD-10-CM | POA: Diagnosis not present

## 2023-01-31 DIAGNOSIS — I252 Old myocardial infarction: Secondary | ICD-10-CM | POA: Insufficient documentation

## 2023-01-31 DIAGNOSIS — F17210 Nicotine dependence, cigarettes, uncomplicated: Secondary | ICD-10-CM | POA: Insufficient documentation

## 2023-01-31 DIAGNOSIS — J449 Chronic obstructive pulmonary disease, unspecified: Secondary | ICD-10-CM | POA: Insufficient documentation

## 2023-01-31 DIAGNOSIS — I739 Peripheral vascular disease, unspecified: Secondary | ICD-10-CM | POA: Diagnosis not present

## 2023-01-31 DIAGNOSIS — I87333 Chronic venous hypertension (idiopathic) with ulcer and inflammation of bilateral lower extremity: Secondary | ICD-10-CM | POA: Diagnosis not present

## 2023-01-31 NOTE — Progress Notes (Signed)
CAPERS, HAGMANN ([id number]) [id number] Page 1 of 11 Visit Report for 01/31/2023 Chief Complaint Document Details Patient Name: Date of Service: [name] [name] 01/31/2023 9:30 A M Medical Record Number: [id number] Patient Account Number: [id number] Date of Birth/Sex: Treating RN: 05/20/1955 (67 y.o. M) Primary Care Provider: Lynnea Ferrier Other Clinician: Referring Provider: Treating Provider/Extender: [name] in Treatment: 1 Information Obtained from: Patient Chief Complaint Patient presents for treatment of open ulcers due to venous insufficiency Electronic Signature(s) Signed: 01/31/2023 10:23:47 AM By: Duanne Guess MD FACS Entered By: Duanne Guess on 01/31/2023 07:23:47 -------------------------------------------------------------------------------- Debridement Details Patient Name: Date of Service: [name], RO [name] 01/31/2023 9:30 A M Medical Record Number: [id number] Patient Account Number: [id number] Date of Birth/Sex: Treating RN: 07/02/1955 (67 y.o. [name] Primary Care Provider: Lynnea Ferrier Other Clinician: Referring Provider: Treating Provider/Extender: [name] in Treatment: 1 Debridement Performed for Assessment: Wound #2 Left,Lateral Lower Leg Performed By: Physician Duanne Guess, MD The following information was scribed by: Zenaida Deed The information was scribed for: Duanne Guess Debridement Type: Debridement Severity of Tissue Pre Debridement: Fat layer exposed Level of Consciousness (Pre-procedure): Awake and Alert Pre-procedure Verification/Time Out Yes - 10:10 Taken: Start Time: 10:10 Pain Control: Lidocaine 4% T opical Solution Percent of Wound Bed Debrided: 100% T Area Debrided (cm): otal 0.94 Tissue and other material debrided: Viable, Non-Viable, Slough, Subcutaneous, Slough Level: Skin/Subcutaneous Tissue Debridement  Description: Excisional Instrument: Curette Bleeding: Minimum Hemostasis Achieved: Pressure Procedural Pain: 2 Post Procedural Pain: 1 Response to Treatment: Procedure was tolerated well Level of Consciousness (Post- Awake and Alert procedure): Post Debridement Measurements of Total Wound Length: (cm) 1.2 Width: (cm) 1 Depth: (cm) 0.1 Volume: (cm) 0.094 Seal, [name] ([id number]) [id number] Page 2 of 11 Character of Wound/Ulcer Post Debridement: Improved Severity of Tissue Post Debridement: Fat layer exposed Post Procedure Diagnosis Same as Pre-procedure Electronic Signature(s) Signed: 01/31/2023 10:27:11 AM By: Duanne Guess MD FACS Signed: 01/31/2023 1:05:52 PM By: Zenaida Deed RN, BSN Entered By: Zenaida Deed on 01/31/2023 07:13:41 -------------------------------------------------------------------------------- Debridement Details Patient Name: Date of Service: [name], RO [name] 01/31/2023 9:30 A M Medical Record Number: [id number] Patient Account Number: [id number] Date of Birth/Sex: Treating RN: 1955-07-16 (67 y.o. [name] Primary Care Provider: Lynnea Ferrier Other Clinician: Referring Provider: Treating Provider/Extender: [name] in Treatment: 1 Debridement Performed for Assessment: Wound #1 Right,Lateral Lower Leg Performed By: Physician Duanne Guess, MD The following information was scribed by: Zenaida Deed The information was scribed for: Duanne Guess Debridement Type: Debridement Severity of Tissue Pre Debridement: Fat layer exposed Level of Consciousness (Pre-procedure): Awake and Alert Pre-procedure Verification/Time Out Yes - 10:10 Taken: Start Time: 10:10 Pain Control: Lidocaine 4% T opical Solution Percent of Wound Bed Debrided: 100% T Area Debrided (cm): otal 7.46 Tissue and other material debrided: Viable, Non-Viable, Slough, Subcutaneous, Slough Level:  Skin/Subcutaneous Tissue Debridement Description: Excisional Instrument: Curette Bleeding: Minimum Hemostasis Achieved: Pressure Procedural Pain: 2 Post Procedural Pain: 1 Response to Treatment: Procedure was tolerated well Level of Consciousness (Post- Awake and Alert procedure): Post Debridement Measurements of Total Wound Length: (cm) 3.8 Width: (cm) 2.5 Depth: (cm) 0.1 Volume: (cm) 0.746 Character of Wound/Ulcer Post Debridement: Improved Severity of Tissue Post Debridement: Fat layer exposed Post Procedure Diagnosis Same as Pre-procedure Electronic Signature(s) Signed: 01/31/2023 10:27:11 AM By: Duanne Guess MD FACS Signed: 01/31/2023 1:05:52 PM By: Zenaida Deed RN, BSN Entered By: Zenaida Deed on 01/31/2023 07:14:42 [name] ([id number]) [id number] Page  3 of 11 -------------------------------------------------------------------------------- HPI Details Patient Name: Date of Service: [name] 01/31/2023 9:30 A M Medical Record Number: [id number] Patient Account Number: [id number] Date of Birth/Sex: Treating RN: 19-Mar-1956 (68 y.o. M) Primary Care Provider: Lynnea Ferrier Other Clinician: Referring Provider: Treating Provider/Extender: [name] in Treatment: 1 History of Present Illness HPI Description: ADMISSION 01/23/2023 ***VASCULAR STUDIES:*** ABIs 11/2021: +-------+-----------+-----------+------------+------------+ ABI/TBIT oday's ABIT oday's TBIPrevious ABIPrevious TBI +-------+-----------+-----------+------------+------------+ Right 1.08 0.35 0.82 0.29  +-------+-----------+-----------+------------+------------+ Left 0.97 0.27 0.88 0.27  +-------+-----------+-----------+------------+------------+ Venous reflux 03/2021 Venous Reflux Times +--------------+---------+------+-----------+------------+--------+ RIGHT Reflux NoRefluxReflux TimeDiameter  cmsComments    Yes     +--------------+---------+------+-----------+------------+--------+ CFV   yes  >1 second    +--------------+---------+------+-----------+------------+--------+ FV prox   yes  >1 second    +--------------+---------+------+-----------+------------+--------+ FV mid   yes  >1 second    +--------------+---------+------+-----------+------------+--------+ FV dist   yes  >1 second    +--------------+---------+------+-----------+------------+--------+ Popliteal   yes  >1 second    +--------------+---------+------+-----------+------------+--------+ GSV at SFJ   yes  >500 ms  0.736   +--------------+---------+------+-----------+------------+--------+ GSV prox thigh  yes  >500 ms  0.591   +--------------+---------+------+-----------+------------+--------+ GSV mid thigh   yes  >500 ms  0.591   +--------------+---------+------+-----------+------------+--------+ GSV dist thigh  yes  >500 ms  0.546   +--------------+---------+------+-----------+------------+--------+ GSV at knee no    0.592   +--------------+---------+------+-----------+------------+--------+ GSV prox calf     0.441   +--------------+---------+------+-----------+------------+--------+ GSV mid calf     0.461   +--------------+---------+------+-----------+------------+--------+ SSV Pop Fossa no    0.406   +--------------+---------+------+-----------+------------+--------+ SSV prox calf   yes  >500 ms  0.379   +--------------+---------+------+-----------+------------+--------+ SSV mid calf     0.379   +--------------+---------+------+-----------+------------+--------+ Summary: Right: - No evidence of deep vein thrombosis seen in the right lower extremity, from the common femoral through the popliteal veins. - No evidence of superficial venous thrombosis in the right lower extremity. - Venous  reflux is noted in the right common femoral vein. - Venous reflux is noted in the right sapheno-femoral junction. - Venous reflux is noted in the right greater saphenous vein in the thigh. - Venous reflux is noted in the right femoral vein. - Venous reflux is noted in the right popliteal vein. - Venous reflux is noted in the right short saphenous vein. This is a 67 year old smoker with a significant history of cardiac and peripheral vascular disease. He has had issues with venous ulceration on his right leg that have resolved with local care in the past, but the most recent wound has been present for a couple of months and has not healed despite treatment with Silvadene cream and home application of Unna boots. He also has developed 2 small ulcers on the left that have presented within the past couple of weeks. Of note, his right greater saphenous vein was harvested in 2023 for femoral above-knee popliteal bypass. He was referred here by his primary care provider for further evaluation and management of his lower extremity venous ulcers. [name], [name] ([id number]) [id number] Page 4 of 11 01/31/2023: The left medial lower leg wound is healed. The left lateral and right lateral lower leg wounds are both smaller. They have slough accumulation on their surfaces. Edema control is excellent. Electronic Signature(s) Signed: 01/31/2023 10:24:44 AM By: Duanne Guess MD FACS Entered By: Duanne Guess on 01/31/2023 07:24:44 -------------------------------------------------------------------------------- Physical Exam Details Patient Name: Date of Service: [name], RO [name] 01/31/2023 9:30 A M Medical Record Number: [id number] Patient Account Number: [id number] Date of  Birth/Sex: Treating RN: 03/24/1956 (67 y.o. M) Primary Care Provider: Lynnea Ferrier Other Clinician: Referring Provider: Treating Provider/Extender: [name] in Treatment:  1 Constitutional Slightly hypertensive. . . . no acute distress. Respiratory Normal work of breathing on room air.. Notes 01/31/2023: The left medial lower leg wound is healed. The left lateral and right lateral lower leg wounds are both smaller. They have slough accumulation on their surfaces. Edema control is excellent. Electronic Signature(s) Signed: 01/31/2023 10:25:22 AM By: Duanne Guess MD FACS Entered By: Duanne Guess on 01/31/2023 07:25:22 -------------------------------------------------------------------------------- Physician Orders Details Patient Name: Date of Service: [name], RO [name] 01/31/2023 9:30 A M Medical Record Number: [id number] Patient Account Number: [id number] Date of Birth/Sex: Treating RN: [birth year] (67 y.o. [name] Primary Care Provider: Lynnea Ferrier Other Clinician: Referring Provider: Treating Provider/Extender: [name] in Treatment: 1 The following information was scribed by: Zenaida Deed The information was scribed for: Duanne Guess Verbal / Phone Orders: No Diagnosis Coding ICD-10 Coding Code Description [phone] Non-pressure chronic ulcer of other part of right lower leg with fat layer exposed L97.822 Non-pressure chronic ulcer of other part of left lower leg with fat layer exposed I87.333 Chronic venous hypertension (idiopathic) with ulcer and inflammation of bilateral lower extremity I73.9 Peripheral vascular disease, unspecified Follow-up Appointments ppointment in 1 week. - Dr. Lady Gary - room 2 Return A 11/6 @ 10:15 am Anesthetic [name], [name] ([id number]) [id number] Page 5 of 11 (In clinic) Topical Lidocaine 4% applied to wound bed Bathing/ Shower/ Hygiene May shower with protection but do not get wound dressing(s) wet. Protect dressing(s) with water repellant cover (for example, large plastic bag) or a cast cover and may then take shower. Additional  Orders / Instructions Follow Nutritious Diet - vitamin C 500 mg start once a day and if tolerated then twice a day, zinc 30-50 mg once daily, protein shakes daily Wound Treatment Wound #1 - Lower Leg Wound Laterality: Right, Lateral Cleanser: Soap and Water 1 x Per Week/30 Days Discharge Instructions: May shower and wash wound with dial antibacterial soap and water prior to dressing change. Cleanser: Wound Cleanser 1 x Per Week/30 Days Discharge Instructions: Cleanse the wound with wound cleanser prior to applying a clean dressing using gauze sponges, not tissue or cotton balls. Peri-Wound Care: Sween Lotion (Moisturizing lotion) 1 x Per Week/30 Days Discharge Instructions: Apply moisturizing lotion as directed Prim Dressing: Maxorb Extra Ag+ Alginate Dressing, 2x2 (in/in) 1 x Per Week/30 Days ary Discharge Instructions: Apply to wound bed as instructed Secondary Dressing: ABD Pad, 5x9 1 x Per Week/30 Days Discharge Instructions: Apply over primary dressing as directed. Compression Wrap: Urgo K2 Lite, (equivalent to a 3 layer) two layer compression system, regular 1 x Per Week/30 Days Discharge Instructions: Apply Urgo K2 Lite as directed (alternative to 3 layer compression). Compression Stockings: Circaid Juxta Lite Compression Wrap Right Leg Compression Amount: 30-40 mmHG Discharge Instructions: Apply Circaid Juxta Lite Compression Wrap daily as instructed. Apply first thing in the morning, remove at night before bed. Wound #2 - Lower Leg Wound Laterality: Left, Lateral Cleanser: Soap and Water 1 x Per Week/30 Days Discharge Instructions: May shower and wash wound with dial antibacterial soap and water prior to dressing change. Cleanser: Wound Cleanser 1 x Per Week/30 Days Discharge Instructions: Cleanse the wound with wound cleanser prior to applying a clean dressing using gauze sponges, not tissue or cotton balls. Peri-Wound Care: Sween Lotion (Moisturizing lotion) 1 x Per Week/30  Days Discharge Instructions:  Apply moisturizing lotion as directed Prim Dressing: Maxorb Extra Ag+ Alginate Dressing, 2x2 (in/in) 1 x Per Week/30 Days ary Discharge Instructions: Apply to wound bed as instructed Secondary Dressing: ABD Pad, 5x9 1 x Per Week/30 Days Discharge Instructions: Apply over primary dressing as directed. Compression Wrap: Urgo K2 Lite, (equivalent to a 3 layer) two layer compression system, regular 1 x Per Week/30 Days Discharge Instructions: Apply Urgo K2 Lite as directed (alternative to 3 layer compression). Compression Stockings: Circaid Juxta Lite Compression Wrap (DME) Left Leg Compression Amount: 30-40 mmHG Discharge Instructions: Apply Circaid Juxta Lite Compression Wrap daily as instructed. Apply first thing in the morning, remove at night before bed. Electronic Signature(s) Signed: 01/31/2023 10:27:11 AM By: Duanne Guess MD FACS Entered By: Duanne Guess on 01/31/2023 07:25:36 -------------------------------------------------------------------------------- Problem List Details Patient Name: Date of Service: [name], RO [name] 01/31/2023 9:30 A M Medical Record Number: [id number] Patient Account Number: [id number] Date of Birth/Sex: Treating RN: 1955-07-16 (67 y.o. [name] [place], [place] ([id number]) [id number] Page 6 of 11 Primary Care Provider: Lynnea Ferrier Other Clinician: Referring Provider: Treating Provider/Extender: [name] in Treatment: 1 Active Problems ICD-10 Encounter Code Description Active Date MDM Diagnosis L97.812 Non-pressure chronic ulcer of other part of right lower leg with fat layer 01/23/2023 No Yes exposed L97.822 Non-pressure chronic ulcer of other part of left lower leg with fat layer 01/23/2023 No Yes exposed I87.333 Chronic venous hypertension (idiopathic) with ulcer and inflammation of 01/23/2023 No Yes bilateral lower extremity I73.9  Peripheral vascular disease, unspecified 01/23/2023 No Yes Inactive Problems Resolved Problems Electronic Signature(s) Signed: 01/31/2023 10:23:26 AM By: Duanne Guess MD FACS Entered By: Duanne Guess on 01/31/2023 07:23:26 -------------------------------------------------------------------------------- Progress Note Details Patient Name: Date of Service: [name], RO [name] 01/31/2023 9:30 A M Medical Record Number: [id number] Patient Account Number: [id number] Date of Birth/Sex: Treating RN: 29-Jul-1955 (67 y.o. M) Primary Care Provider: Lynnea Ferrier Other Clinician: Referring Provider: Treating Provider/Extender: [name] in Treatment: 1 Subjective Chief Complaint Information obtained from Patient Patient presents for treatment of open ulcers due to venous insufficiency History of Present Illness (HPI) ADMISSION 01/23/2023 ***VASCULAR STUDIES:*** ABIs 11/2021: +-------+-----------+-----------+------------+------------+ ABI/TBIT oday's ABIT oday's TBIPrevious ABIPrevious TBI +-------+-----------+-----------+------------+------------+ Right 1.08 0.35 0.82 0.29  +-------+-----------+-----------+------------+------------+ Left 0.97 0.27 0.88 0.27  +-------+-----------+-----------+------------+------------+ Venous reflux 03/2021 Venous Reflux Times +--------------+---------+------+-----------+------------+--------+ [name], [name] ([id number]) [id number] Page 7 of 11 RIGHT Reflux NoRefluxReflux TimeDiameter cmsComments    Yes     +--------------+---------+------+-----------+------------+--------+ CFV   yes  >1 second    +--------------+---------+------+-----------+------------+--------+ FV prox   yes  >1 second    +--------------+---------+------+-----------+------------+--------+ FV mid   yes  >1 second     +--------------+---------+------+-----------+------------+--------+ FV dist   yes  >1 second    +--------------+---------+------+-----------+------------+--------+ Popliteal   yes  >1 second    +--------------+---------+------+-----------+------------+--------+ GSV at SFJ   yes  >500 ms  0.736   +--------------+---------+------+-----------+------------+--------+ GSV prox thigh  yes  >500 ms  0.591   +--------------+---------+------+-----------+------------+--------+ GSV mid thigh   yes  >500 ms  0.591   +--------------+---------+------+-----------+------------+--------+ GSV dist thigh  yes  >500 ms  0.546   +--------------+---------+------+-----------+------------+--------+ GSV at knee no    0.592   +--------------+---------+------+-----------+------------+--------+ GSV prox calf     0.441   +--------------+---------+------+-----------+------------+--------+ GSV mid calf     0.461   +--------------+---------+------+-----------+------------+--------+ SSV Pop Fossa no    0.406   +--------------+---------+------+-----------+------------+--------+ SSV prox calf   yes  >500 ms  0.379   +--------------+---------+------+-----------+------------+--------+ SSV mid calf  0.379   +--------------+---------+------+-----------+------------+--------+ Summary: Right: - No evidence of deep vein thrombosis seen in the right lower extremity, from the common femoral through the popliteal veins. - No evidence of superficial venous thrombosis in the right lower extremity. - Venous reflux is noted in the right common femoral vein. - Venous reflux is noted in the right sapheno-femoral junction. - Venous reflux is noted in the right greater saphenous vein in the thigh. - Venous reflux is noted in the right femoral vein. - Venous reflux is noted in the right popliteal vein. - Venous reflux is noted in the right short  saphenous vein. This is a 67 year old smoker with a significant history of cardiac and peripheral vascular disease. He has had issues with venous ulceration on his right leg that have resolved with local care in the past, but the most recent wound has been present for a couple of months and has not healed despite treatment with Silvadene cream and home application of Unna boots. He also has developed 2 small ulcers on the left that have presented within the past couple of weeks. Of note, his right greater saphenous vein was harvested in 2023 for femoral above-knee popliteal bypass. He was referred here by his primary care provider for further evaluation and management of his lower extremity venous ulcers. 01/31/2023: The left medial lower leg wound is healed. The left lateral and right lateral lower leg wounds are both smaller. They have slough accumulation on their surfaces. Edema control is excellent. Patient History Information obtained from Patient, Chart. Family History Cancer - Mother,Child, Diabetes - Maternal Grandparents, Heart Disease - Father, Lung Disease - Mother, Stroke - Father, No family history of Hereditary Spherocytosis, Hypertension, Kidney Disease, Seizures, Thyroid Problems, Tuberculosis. Social History Current every day smoker - 1/2 ppd, Marital Status - Married, Alcohol Use - Rarely, Drug Use - No History, Caffeine Use - Moderate. Medical History Respiratory Patient has history of Chronic Obstructive Pulmonary Disease (COPD) Cardiovascular Patient has history of Angina, Arrhythmia - a-fib, Coronary Artery Disease, Hypertension, Myocardial Infarction, Peripheral Venous Disease Hospitalization/Surgery History - Incision and drainage of wound (Right). - Application of wound vac. - Femoral artery exploration (Right). - Lower extremity angiogram (Right). - Femoral-popliteal bypass graft (Right). - Abdominal aortogram w/lower extremity (Bilateral). - Colonoscopy with propofol. -  Polypectomy. - Laparoscopic appendectomy. - Coronary angioplasty with stent placement. - Aortic and mitral valve replacement. - Appendectomy. - Cardiac valve replacement. Medical A Surgical History Notes nd Cardiovascular rheumatic heart disease, Hx of artificial heart valve replacement, Mitral insufficiency and aortic stenosis Gastrointestinal hiatal hernia Neurologic stroke Oncologic skin cancer [name], [name] ([id number]) [id number].pdf Page 8 of 11 Objective Constitutional Slightly hypertensive. no acute distress. Vitals Time Taken: 9:50 AM, Height: 68 in, Weight: 208 lbs, BMI: 31.6, Temperature: 97.7 F, Pulse: 80 bpm, Respiratory Rate: 20 breaths/min, Blood Pressure: 145/78 mmHg. Respiratory Normal work of breathing on room air.. General Notes: 01/31/2023: The left medial lower leg wound is healed. The left lateral and right lateral lower leg wounds are both smaller. They have slough accumulation on their surfaces. Edema control is excellent. Integumentary (Hair, Skin) Wound #1 status is Open. Original cause of wound was Gradually Appeared. The date acquired was: 10/31/2022. The wound has been in treatment 1 weeks. The wound is located on the Right,Lateral Lower Leg. The wound measures 3.8cm length x 2.5cm width x 0.1cm depth; 7.461cm^2 area and 0.746cm^3 volume. There is Fat Layer (Subcutaneous Tissue) exposed. There is no tunneling or undermining noted. There is a medium amount  of serosanguineous drainage noted. The wound margin is distinct with the outline attached to the wound base. There is small (1-33%) red granulation within the wound bed. There is a large (67-100%) amount of necrotic tissue within the wound bed including Eschar and Adherent Slough. The periwound skin appearance had no abnormalities noted for texture. The periwound skin appearance had no abnormalities noted for moisture. The periwound skin appearance exhibited: Hemosiderin Staining.  Periwound temperature was noted as No Abnormality. Wound #2 status is Open. Original cause of wound was Gradually Appeared. The date acquired was: 01/13/2023. The wound has been in treatment 1 weeks. The wound is located on the Left,Lateral Lower Leg. The wound measures 1.2cm length x 1cm width x 0.1cm depth; 0.942cm^2 area and 0.094cm^3 volume. There is Fat Layer (Subcutaneous Tissue) exposed. There is no tunneling or undermining noted. There is a medium amount of serosanguineous drainage noted. The wound margin is distinct with the outline attached to the wound base. There is small (1-33%) red, hyper - granulation within the wound bed. There is a large (67-100%) amount of necrotic tissue within the wound bed including Eschar and Adherent Slough. The periwound skin appearance had no abnormalities noted for texture. The periwound skin appearance had no abnormalities noted for moisture. The periwound skin appearance exhibited: Hemosiderin Staining. Periwound temperature was noted as No Abnormality. Wound #3 status is Healed - Epithelialized. Original cause of wound was Gradually Appeared. The date acquired was: 01/16/2023. The wound has been in treatment 1 weeks. The wound is located on the Left,Medial Lower Leg. The wound measures 0cm length x 0cm width x 0cm depth; 0cm^2 area and 0cm^3 volume. There is Fat Layer (Subcutaneous Tissue) exposed. There is no tunneling or undermining noted. There is a medium amount of serosanguineous drainage noted. The wound margin is distinct with the outline attached to the wound base. There is small (1-33%) red granulation within the wound bed. There is a large (67-100%) amount of necrotic tissue within the wound bed including Eschar. The periwound skin appearance had no abnormalities noted for texture. The periwound skin appearance had no abnormalities noted for moisture. The periwound skin appearance exhibited: Hemosiderin Staining. Periwound temperature was noted  as No Abnormality. Assessment Active Problems ICD-10 Non-pressure chronic ulcer of other part of right lower leg with fat layer exposed Non-pressure chronic ulcer of other part of left lower leg with fat layer exposed Chronic venous hypertension (idiopathic) with ulcer and inflammation of bilateral lower extremity Peripheral vascular disease, unspecified Procedures Wound #1 Pre-procedure diagnosis of Wound #1 is a Venous Leg Ulcer located on the Right,Lateral Lower Leg .Severity of Tissue Pre Debridement is: Fat layer exposed. There was a Excisional Skin/Subcutaneous Tissue Debridement with a total area of 7.46 sq cm performed by Duanne Guess, MD. With the following instrument(s): Curette to remove Viable and Non-Viable tissue/material. Material removed includes Subcutaneous Tissue and Slough and after achieving pain control using Lidocaine 4% T opical Solution. No specimens were taken. A time out was conducted at 10:10, prior to the start of the procedure. A Minimum amount of bleeding was controlled with Pressure. The procedure was tolerated well with a pain level of 2 throughout and a pain level of 1 following the procedure. Post Debridement Measurements: 3.8cm length x 2.5cm width x 0.1cm depth; 0.746cm^3 volume. Character of Wound/Ulcer Post Debridement is improved. Severity of Tissue Post Debridement is: Fat layer exposed. Post procedure Diagnosis Wound #1: Same as Pre-Procedure Pre-procedure diagnosis of Wound #1 is a Venous Leg Ulcer located on  the Right,Lateral Lower Leg . There was a Double Layer Compression Therapy Procedure by Zenaida Deed, RN. Post procedure Diagnosis Wound #1: Same as Pre-Procedure Notes: urgo lite. Wound #2 Pre-procedure diagnosis of Wound #2 is a Venous Leg Ulcer located on the Left,Lateral Lower Leg .Severity of Tissue Pre Debridement is: Fat layer exposed. There was a Excisional Skin/Subcutaneous Tissue Debridement with a total area of 0.94 sq cm  performed by Duanne Guess, MD. With the following [name] ([id number]) [id number] Page 9 of 11 instrument(s): Curette to remove Viable and Non-Viable tissue/material. Material removed includes Subcutaneous Tissue and Slough and after achieving pain control using Lidocaine 4% T opical Solution. No specimens were taken. A time out was conducted at 10:10, prior to the start of the procedure. A Minimum amount of bleeding was controlled with Pressure. The procedure was tolerated well with a pain level of 2 throughout and a pain level of 1 following the procedure. Post Debridement Measurements: 1.2cm length x 1cm width x 0.1cm depth; 0.094cm^3 volume. Character of Wound/Ulcer Post Debridement is improved. Severity of Tissue Post Debridement is: Fat layer exposed. Post procedure Diagnosis Wound #2: Same as Pre-Procedure Pre-procedure diagnosis of Wound #2 is a Venous Leg Ulcer located on the Left,Lateral Lower Leg . There was a Double Layer Compression Therapy Procedure by Zenaida Deed, RN. Post procedure Diagnosis Wound #2: Same as Pre-Procedure Notes: urgo lite. Plan Follow-up Appointments: Return Appointment in 1 week. - Dr. Lady Gary - room 2 11/6 @ 10:15 am Anesthetic: (In clinic) Topical Lidocaine 4% applied to wound bed Bathing/ Shower/ Hygiene: May shower with protection but do not get wound dressing(s) wet. Protect dressing(s) with water repellant cover (for example, large plastic bag) or a cast cover and may then take shower. Additional Orders / Instructions: Follow Nutritious Diet - vitamin C 500 mg start once a day and if tolerated then twice a day, zinc 30-50 mg once daily, protein shakes daily WOUND #1: - Lower Leg Wound Laterality: Right, Lateral Cleanser: Soap and Water 1 x Per Week/30 Days Discharge Instructions: May shower and wash wound with dial antibacterial soap and water prior to dressing change. Cleanser: Wound Cleanser 1 x Per Week/30  Days Discharge Instructions: Cleanse the wound with wound cleanser prior to applying a clean dressing using gauze sponges, not tissue or cotton balls. Peri-Wound Care: Sween Lotion (Moisturizing lotion) 1 x Per Week/30 Days Discharge Instructions: Apply moisturizing lotion as directed Prim Dressing: Maxorb Extra Ag+ Alginate Dressing, 2x2 (in/in) 1 x Per Week/30 Days ary Discharge Instructions: Apply to wound bed as instructed Secondary Dressing: ABD Pad, 5x9 1 x Per Week/30 Days Discharge Instructions: Apply over primary dressing as directed. Com pression Wrap: Urgo K2 Lite, (equivalent to a 3 layer) two layer compression system, regular 1 x Per Week/30 Days Discharge Instructions: Apply Urgo K2 Lite as directed (alternative to 3 layer compression). Com pression Stockings: Circaid Juxta Lite Compression Wrap Compression Amount: 30-40 mmHg (right) Discharge Instructions: Apply Circaid Juxta Lite Compression Wrap daily as instructed. Apply first thing in the morning, remove at night before bed. WOUND #2: - Lower Leg Wound Laterality: Left, Lateral Cleanser: Soap and Water 1 x Per Week/30 Days Discharge Instructions: May shower and wash wound with dial antibacterial soap and water prior to dressing change. Cleanser: Wound Cleanser 1 x Per Week/30 Days Discharge Instructions: Cleanse the wound with wound cleanser prior to applying a clean dressing using gauze sponges, not tissue or cotton balls. Peri-Wound Care: Sween Lotion (Moisturizing lotion) 1 x Per Week/30  Days Discharge Instructions: Apply moisturizing lotion as directed Prim Dressing: Maxorb Extra Ag+ Alginate Dressing, 2x2 (in/in) 1 x Per Week/30 Days ary Discharge Instructions: Apply to wound bed as instructed Secondary Dressing: ABD Pad, 5x9 1 x Per Week/30 Days Discharge Instructions: Apply over primary dressing as directed. Com pression Wrap: Urgo K2 Lite, (equivalent to a 3 layer) two layer compression system, regular 1 x Per  Week/30 Days Discharge Instructions: Apply Urgo K2 Lite as directed (alternative to 3 layer compression). Com pression Stockings: Circaid Juxta Lite Compression Wrap (DME) Compression Amount: 30-40 mmHg (left) Discharge Instructions: Apply Circaid Juxta Lite Compression Wrap daily as instructed. Apply first thing in the morning, remove at night before bed. 01/31/2023: The left medial lower leg wound is healed. The left lateral and right lateral lower leg wounds are both smaller. They have slough accumulation on their surfaces. Edema control is excellent. I used a curette to debride slough and subcutaneous tissue from the right and left lateral leg wounds. We will continue silver alginate with Urgo light compression. We had ordered him juxta lite stockings for both legs, but apparently the DME company only sent 1 so we will communicate with them to get the second wrap sent. Follow-up in 1 week. Electronic Signature(s) Signed: 01/31/2023 10:26:20 AM By: Duanne Guess MD FACS Entered By: Duanne Guess on 01/31/2023 07:26:20 [name] ([id number]) [id number] Page 10 of 11 -------------------------------------------------------------------------------- HxROS Details Patient Name: Date of Service: [name] [name] 01/31/2023 9:30 A M Medical Record Number: [id number] Patient Account Number: [id number] Date of Birth/Sex: Treating RN: 1955-12-25 (67 y.o. M) Primary Care Provider: Lynnea Ferrier Other Clinician: Referring Provider: Treating Provider/Extender: [name] in Treatment: 1 Information Obtained From Patient Chart Respiratory Medical History: Positive for: Chronic Obstructive Pulmonary Disease (COPD) Cardiovascular Medical History: Positive for: Angina; Arrhythmia - a-fib; Coronary Artery Disease; Hypertension; Myocardial Infarction; Peripheral Venous Disease Past Medical History Notes: rheumatic heart disease, Hx  of artificial heart valve replacement, Mitral insufficiency and aortic stenosis Gastrointestinal Medical History: Past Medical History Notes: hiatal hernia Neurologic Medical History: Past Medical History Notes: stroke Oncologic Medical History: Past Medical History Notes: skin cancer Immunizations Pneumococcal Vaccine: Received Pneumococcal Vaccination: Yes Received Pneumococcal Vaccination On or After 60th Birthday: Yes Implantable Devices None Hospitalization / Surgery History Type of Hospitalization/Surgery Incision and drainage of wound (Right) Application of wound vac Femoral artery exploration (Right) Lower extremity angiogram (Right) Femoral-popliteal bypass graft (Right) Abdominal aortogram w/lower extremity (Bilateral) Colonoscopy with propofol Polypectomy Laparoscopic appendectomy Coronary angioplasty with stent placement Aortic and mitral valve replacement Appendectomy Cardiac valve replacement Family and Social History Cancer: Yes - Mother,Child; Diabetes: Yes - Maternal Grandparents; Heart Disease: Yes - Father; Hereditary Spherocytosis: No; Hypertension: No; Kidney Disease: No; Lung Disease: Yes - Mother; Seizures: No; Stroke: Yes - Father; Thyroid Problems: No; Tuberculosis: No; Current every day smoker - 1/2 ppd; Marital Status - Married; Alcohol Use: Rarely; Drug Use: No History; Caffeine Use: Moderate; Financial Concerns: No; Food, Clothing or Shelter Needs: No; Support System Lacking: No; Transportation Concerns: No Electronic Signature(s) Signed: 01/31/2023 10:27:11 AM By: Duanne Guess MD FACS [name], [name] ([id number]) [id number] Page 11 of 11 Entered By: Duanne Guess on 01/31/2023 07:25:00 -------------------------------------------------------------------------------- SuperBill Details Patient Name: Date of Service: [name] 01/31/2023 Medical Record Number: [id number] Patient Account Number:  [id number] Date of Birth/Sex: Treating RN: November 30, 1955 (68 y.o. M) Primary Care Provider: Lynnea Ferrier Other Clinician: Referring Provider: Treating Provider/Extender: [name] in Treatment: 1 Diagnosis  Coding ICD-10 Codes Code Description [phone] Non-pressure chronic ulcer of other part of right lower leg with fat layer exposed L97.822 Non-pressure chronic ulcer of other part of left lower leg with fat layer exposed I87.333 Chronic venous hypertension (idiopathic) with ulcer and inflammation of bilateral lower extremity I73.9 Peripheral vascular disease, unspecified Facility Procedures : CPT4 Code: [id number] Description: 11042 - DEB SUBQ TISSUE 20 SQ CM/< ICD-10 Diagnosis Description L97.812 Non-pressure chronic ulcer of other part of right lower leg with fat layer exp X91.478 Non-pressure chronic ulcer of other part of left lower leg with fat layer expo Modifier: osed sed Quantity: 1 Physician Procedures : CPT4 Code Description Modifier [id number] - WC PHYS LEVEL 3 - EST PT ICD-10 Diagnosis Description L97.812 Non-pressure chronic ulcer of other part of right lower leg with fat layer exposed L97.822 Non-pressure chronic ulcer of other part of left  lower leg with fat layer exposed I87.333 Chronic venous hypertension (idiopathic) with ulcer and inflammation of bilateral lower extremity I73.9 Peripheral vascular disease, unspecified Quantity: 1 : [id number] 11042 - WC PHYS SUBQ TISS 20 SQ CM ICD-10 Diagnosis Description L97.812 Non-pressure chronic ulcer of other part of right lower leg with fat layer exposed L97.822 Non-pressure chronic ulcer of other part of left lower leg with fat layer exposed Quantity: 1 Electronic Signature(s) Signed: 01/31/2023 10:26:36 AM By: Duanne Guess MD FACS Entered By: Duanne Guess on 01/31/2023 07:26:36

## 2023-01-31 NOTE — Progress Notes (Signed)
Gregory, [name] ([id number]) [id number] Page 1 of 12 Visit Report for 01/31/2023 Arrival Information Details Patient Name: Date of Service: [name] 01/31/2023 9:30 A M Medical Record Number: [id number] Patient Account Number: [id number] Date of Birth/Sex: Treating RN: 08/12/1955 (67 y.o. M) Primary Care Nickolaus Bordelon: Lynnea Ferrier Other Clinician: Referring Camilah Spillman: Treating Felecia Stanfill/Extender: [name] in Treatment: 1 Visit Information History Since Last Visit Added or deleted any medications: No Patient Arrived: Ambulatory Any new allergies or adverse reactions: No Arrival Time: 09:49 Had a fall or experienced change in No Accompanied By: wife activities of daily living that may affect Transfer Assistance: None risk of falls: Patient Identification Verified: Yes Signs or symptoms of abuse/neglect since last visito No Secondary Verification Process Completed: Yes Hospitalized since last visit: No Implantable device outside of the clinic excluding No cellular tissue based products placed in the center since last visit: Pain Present Now: No Electronic Signature(s) Signed: 01/31/2023 10:12:47 AM By: Dayton Scrape Entered By: Dayton Scrape on 01/31/2023 06:50:29 -------------------------------------------------------------------------------- Compression Therapy Details Patient Name: Date of Service: [name], [name] 01/31/2023 9:30 A M Medical Record Number: [id number] Patient Account Number: [id number] Date of Birth/Sex: Treating RN: 23-Dec-1955 (67 y.o. [name] Primary Care Messiah Ahr: Lynnea Ferrier Other Clinician: Referring Carrianne Hyun: Treating Ashunti Schofield/Extender: [name] in Treatment: 1 Compression Therapy Performed for Wound Assessment: Wound #1 Right,Lateral Lower Leg Performed By: Clinician Zenaida Deed, RN Compression Type: Double Layer Post Procedure Diagnosis Same as  Pre-procedure Notes urgo lite Electronic Signature(s) Signed: 01/31/2023 1:05:52 PM By: Zenaida Deed RN, BSN Entered By: Zenaida Deed on 01/31/2023 07:06:59 [name] ([id number]) [id number] Page 2 of 12 -------------------------------------------------------------------------------- Compression Therapy Details Patient Name: Date of Service: [name] 01/31/2023 9:30 A M Medical Record Number: [id number] Patient Account Number: [id number] Date of Birth/Sex: Treating RN: [birth year] (67 y.o. [name] Primary Care Kendan Cornforth: Lynnea Ferrier Other Clinician: Referring Avani Sensabaugh: Treating Kamera Dubas/Extender: [name] in Treatment: 1 Compression Therapy Performed for Wound Assessment: Wound #2 Left,Lateral Lower Leg Performed By: Clinician Zenaida Deed, RN Compression Type: Double Layer Post Procedure Diagnosis Same as Pre-procedure Notes urgo lite Electronic Signature(s) Signed: 01/31/2023 1:05:52 PM By: Zenaida Deed RN, BSN Entered By: Zenaida Deed on 01/31/2023 07:06:59 -------------------------------------------------------------------------------- Encounter Discharge Information Details Patient Name: Date of Service: [name], [name] 01/31/2023 9:30 A M Medical Record Number: [id number] Patient Account Number: [id number] Date of Birth/Sex: Treating RN: Jan 18, 1956 (67 y.o. [name] Primary Care Brailyn Delman: Lynnea Ferrier Other Clinician: Referring Tesneem Dufrane: Treating Kipper Buch/Extender: [name] in Treatment: 1 Encounter Discharge Information Items Post Procedure Vitals Discharge Condition: Stable Temperature (F): 97.7 Ambulatory Status: Ambulatory Pulse (bpm): 80 Discharge Destination: Home Respiratory Rate (breaths/min): 20 Transportation: Private Auto Blood Pressure (mmHg): 145/78 Accompanied By: wife Schedule Follow-up Appointment:  Yes Clinical Summary of Care: Patient Declined Electronic Signature(s) Signed: 01/31/2023 11:58:07 AM By: Samuella Bruin Entered By: Samuella Bruin on 01/31/2023 07:31:14 -------------------------------------------------------------------------------- Lower Extremity Assessment Details Patient Name: Date of Service: [name] 01/31/2023 9:30 A M Medical Record Number: [id number] Patient Account Number: [id number] Date of Birth/Sex: Treating RN: [birth year] (67 y.o. [name] Primary Care Myeesha Shane: Lynnea Ferrier Other Clinician: Referring Anner Baity: Treating Datra Clary/Extender: Matthew Folks Cavalier, Molly Maduro ([id number]) [id number] Page 3 of 12 Weeks in Treatment: 1 Edema Assessment Assessed: [Left: No] [Right: No] [Left: Edema] [Right: :] Calf Left: Right: Point of Measurement: From Medial Instep 40.2 cm  39 cm Ankle Left: Right: Point of Measurement: From Medial Instep 24.2 cm 23.5 cm Knee To Floor Left: Right: From Medial Instep 41 cm 41 cm Vascular Assessment Pulses: Dorsalis Pedis Palpable: [Left:Yes] [Right:Yes] Extremity colors, hair growth, and conditions: Extremity Color: [Left:Hyperpigmented] [Right:Hyperpigmented] Hair Growth on Extremity: [Left:No] [Right:No] Temperature of Extremity: [Left:Cool] [Right:Cool] Capillary Refill: [Left:< 3 seconds] [Right:< 3 seconds] Dependent Rubor: [Left:No No] [Right:No No] Toe Nail Assessment Left: Right: Thick: Yes Yes Discolored: No No Deformed: No No Improper Length and Hygiene: No No Electronic Signature(s) Signed: 01/31/2023 12:02:50 PM By: Karie Schwalbe RN Signed: 01/31/2023 1:05:52 PM By: Zenaida Deed RN, BSN Entered By: Zenaida Deed on 01/31/2023 07:16:57 -------------------------------------------------------------------------------- Multi Wound Chart Details Patient Name: Date of Service: [name], [name] 01/31/2023 9:30 A M Medical Record  Number: [id number] Patient Account Number: [id number] Date of Birth/Sex: Treating RN: 16-Oct-1955 (67 y.o. M) Primary Care Camdin Hegner: Lynnea Ferrier Other Clinician: Referring Taimur Fier: Treating Lennie Vasco/Extender: [name] in Treatment: 1 Vital Signs Height(in): 68 Pulse(bpm): 80 Weight(lbs): 208 Blood Pressure(mmHg): 145/78 Body Mass Index(BMI): 31.6 Temperature(F): 97.7 Respiratory Rate(breaths/min): 20 [1:Photos:] [[id number] Page 4 of 12] Right, Lateral Lower Leg Left, Lateral Lower Leg Left, Medial Lower Leg Wound Location: Gradually Appeared Gradually Appeared Gradually Appeared Wounding Event: Venous Leg Ulcer Venous Leg Ulcer Venous Leg Ulcer Primary Etiology: Chronic Obstructive Pulmonary Chronic Obstructive Pulmonary Chronic Obstructive Pulmonary Comorbid History: Disease (COPD), Angina, Arrhythmia, Disease (COPD), Angina, Arrhythmia, Disease (COPD), Angina, Arrhythmia, Coronary Artery Disease, Coronary Artery Disease, Coronary Artery Disease, Hypertension, Myocardial Infarction, Hypertension, Myocardial Infarction, Hypertension, Myocardial Infarction, Peripheral Venous Disease Peripheral Venous Disease Peripheral Venous Disease 10/31/2022 01/13/2023 01/16/2023 Date Acquired: 1 1 1  Weeks of Treatment: Open Open Healed - Epithelialized Wound Status: No No No Wound Recurrence: 3.8x2.5x0.1 1.2x1x0.1 0x0x0 Measurements L x W x D (cm) 7.461 0.942 0 A (cm) : rea 0.746 0.094 0 Volume (cm) : 20.80% 16.10% 100.00% % Reduction in A rea: 20.80% 16.10% 100.00% % Reduction in Volume: Full Thickness Without Exposed Full Thickness Without Exposed Full Thickness Without Exposed Classification: Support Structures Support Structures Support Structures Medium Medium Medium Exudate A mount: Serosanguineous Serosanguineous Serosanguineous Exudate Type: red, brown red, brown red, brown Exudate Color: Distinct, outline  attached Distinct, outline attached Distinct, outline attached Wound Margin: Small (1-33%) Small (1-33%) Small (1-33%) Granulation A mount: Red Red, Hyper-granulation Red Granulation Quality: Large (67-100%) Large (67-100%) Large (67-100%) Necrotic A mount: Eschar, Adherent Slough Eschar, Adherent Slough Eschar Necrotic Tissue: Fat Layer (Subcutaneous Tissue): Yes Fat Layer (Subcutaneous Tissue): Yes Fat Layer (Subcutaneous Tissue): Yes Exposed Structures: Fascia: No Fascia: No Fascia: No Tendon: No Tendon: No Tendon: No Muscle: No Muscle: No Muscle: No Joint: No Joint: No Joint: No Bone: No Bone: No Bone: No None Small (1-33%) Large (67-100%) Epithelialization: Debridement - Excisional Debridement - Excisional N/A Debridement: Pre-procedure Verification/Time Out 10:10 10:10 N/A Taken: Lidocaine 4% Topical Solution Lidocaine 4% Topical Solution N/A Pain Control: Subcutaneous, Slough Subcutaneous, Slough N/A Tissue Debrided: Skin/Subcutaneous Tissue Skin/Subcutaneous Tissue N/A Level: 7.46 0.94 N/A Debridement A (sq cm): rea Curette Curette N/A Instrument: Minimum Minimum N/A Bleeding: Pressure Pressure N/A Hemostasis A chieved: 2 2 N/A Procedural Pain: 1 1 N/A Post Procedural Pain: Procedure was tolerated well Procedure was tolerated well N/A Debridement Treatment Response: 3.8x2.5x0.1 1.2x1x0.1 N/A Post Debridement Measurements L x W x D (cm) 0.746 0.094 N/A Post Debridement Volume: (cm) No Abnormalities Noted No Abnormalities Noted No Abnormalities Noted Periwound Skin Texture: No Abnormalities Noted No Abnormalities Noted No  Abnormalities Noted Periwound Skin Moisture: Hemosiderin Staining: Yes Hemosiderin Staining: Yes Hemosiderin Staining: Yes Periwound Skin Color: No Abnormality No Abnormality No Abnormality Temperature: Compression Therapy Compression Therapy N/A Procedures Performed: Debridement Debridement Treatment Notes Wound #1  (Lower Leg) Wound Laterality: Right, Lateral Cleanser Soap and Water Discharge Instruction: May shower and wash wound with dial antibacterial soap and water prior to dressing change. Wound Cleanser Discharge Instruction: Cleanse the wound with wound cleanser prior to applying a clean dressing using gauze sponges, not tissue or cotton balls. Peri-Wound Care Sween Lotion (Moisturizing lotion) Discharge Instruction: Apply moisturizing lotion as directed Topical Primary Dressing Maxorb Extra Ag+ Alginate Dressing, 2x2 (in/in) Murchison, Aleksi ([id number]) [id number] Page 5 of 12 Discharge Instruction: Apply to wound bed as instructed Secondary Dressing ABD Pad, 5x9 Discharge Instruction: Apply over primary dressing as directed. Secured With Compression Wrap Urgo K2 Lite, (equivalent to a 3 layer) two layer compression system, regular Discharge Instruction: Apply Urgo K2 Lite as directed (alternative to 3 layer compression). Compression Stockings Circaid Juxta Lite Compression Wrap Quantity: 1 Right Leg Compression Amount: 30-40 mmHg Discharge Instruction: Apply Circaid Juxta Lite Compression Wrap daily as instructed. Apply first thing in the morning, remove at night before bed. Add-Ons Wound #2 (Lower Leg) Wound Laterality: Left, Lateral Cleanser Soap and Water Discharge Instruction: May shower and wash wound with dial antibacterial soap and water prior to dressing change. Wound Cleanser Discharge Instruction: Cleanse the wound with wound cleanser prior to applying a clean dressing using gauze sponges, not tissue or cotton balls. Peri-Wound Care Sween Lotion (Moisturizing lotion) Discharge Instruction: Apply moisturizing lotion as directed Topical Primary Dressing Maxorb Extra Ag+ Alginate Dressing, 2x2 (in/in) Discharge Instruction: Apply to wound bed as instructed Secondary Dressing ABD Pad, 5x9 Discharge Instruction: Apply over primary dressing as  directed. Secured With Compression Wrap Urgo K2 Lite, (equivalent to a 3 layer) two layer compression system, regular Discharge Instruction: Apply Urgo K2 Lite as directed (alternative to 3 layer compression). Compression Stockings Circaid Juxta Lite Compression Wrap Quantity: 1 Left Leg Compression Amount: 30-40 mmHg Discharge Instruction: Apply Circaid Juxta Lite Compression Wrap daily as instructed. Apply first thing in the morning, remove at night before bed. Add-Ons Wound #3 (Lower Leg) Wound Laterality: Left, Medial Cleanser Peri-Wound Care Topical Primary Dressing Secondary Dressing Secured With Compression Wrap Compression Stockings Add-Ons SHEMUEL, HARKLEROAD ([id number]) [id number] Page 6 of 12 Electronic Signature(s) Signed: 01/31/2023 10:23:41 AM By: Duanne Guess MD FACS Entered By: Duanne Guess on 01/31/2023 07:23:41 -------------------------------------------------------------------------------- Multi-Disciplinary Care Plan Details Patient Name: Date of Service: [name], [name] 01/31/2023 9:30 A M Medical Record Number: [id number] Patient Account Number: [id number] Date of Birth/Sex: Treating RN: 02-Aug-1955 (67 y.o. [name] Primary Care Joniece Smotherman: Lynnea Ferrier Other Clinician: Referring Yahira Timberman: Treating Melyssa Signor/Extender: [name] in Treatment: 1 Multidisciplinary Care Plan reviewed with physician Active Inactive Venous Leg Ulcer Nursing Diagnoses: Actual venous Insuffiency (use after diagnosis is confirmed) Knowledge deficit related to disease process and management Goals: Patient will maintain optimal edema control Date Initiated: 01/23/2023 Target Resolution Date: 03/07/2023 Goal Status: Active Patient/caregiver will verbalize understanding of disease process and disease management Date Initiated: 01/23/2023 Target Resolution Date: 03/07/2023 Goal Status:  Active Interventions: Assess peripheral edema status every visit. Compression as ordered Provide education on venous insufficiency Treatment Activities: Therapeutic compression applied : 01/23/2023 Notes: Wound/Skin Impairment Nursing Diagnoses: Impaired tissue integrity Knowledge deficit related to ulceration/compromised skin integrity Goals: Patient/caregiver will verbalize understanding of skin care regimen Date Initiated: 01/23/2023 Target Resolution Date:  03/07/2023 Goal Status: Active Interventions: Assess ulceration(s) every visit Treatment Activities: Skin care regimen initiated : 01/23/2023 Topical wound management initiated : 01/23/2023 Notes: Electronic Signature(s) Signed: 01/31/2023 1:05:52 PM By: Zenaida Deed RN, BSN Entered By: Zenaida Deed on 01/31/2023 07:02:39 [name] ([id number]) [id number] Page 7 of 12 -------------------------------------------------------------------------------- Pain Assessment Details Patient Name: Date of Service: [name] 01/31/2023 9:30 A M Medical Record Number: [id number] Patient Account Number: [id number] Date of Birth/Sex: Treating RN: 05/07/1955 (67 y.o. M) Primary Care Julann Mcgilvray: Lynnea Ferrier Other Clinician: Referring Pancho Rushing: Treating Valjean Ruppel/Extender: [name] in Treatment: 1 Active Problems Location of Pain Severity and Description of Pain Patient Has Paino No Site Locations Pain Management and Medication Current Pain Management: Electronic Signature(s) Signed: 01/31/2023 10:12:47 AM By: Dayton Scrape Entered By: Dayton Scrape on 01/31/2023 06:51:32 -------------------------------------------------------------------------------- Patient/Caregiver Education Details Patient Name: Date of Service: [name], [name] 11/1/2024andnbsp9:30 A M Medical Record Number: [id number] Patient Account Number: [id number] Date of Birth/Gender: Treating  RN: 08-Mar-1956 (67 y.o. [name] Primary Care Physician: Lynnea Ferrier Other Clinician: Referring Physician: Treating Physician/Extender: [name] in Treatment: 1 Education Assessment Education Provided To: Patient Education Topics Provided Venous: Methods: Explain/Verbal Responses: Reinforcements needed, State content correctly Home, Molly Maduro ([id number]) [id number] Page 8 of 12 Wound/Skin Impairment: Methods: Explain/Verbal Responses: Reinforcements needed, State content correctly Electronic Signature(s) Signed: 01/31/2023 1:05:52 PM By: Zenaida Deed RN, BSN Entered By: Zenaida Deed on 01/31/2023 07:03:10 -------------------------------------------------------------------------------- Wound Assessment Details Patient Name: Date of Service: [name], [name] 01/31/2023 9:30 A M Medical Record Number: [id number] Patient Account Number: [id number] Date of Birth/Sex: Treating RN: 1956-01-23 (67 y.o. M) Primary Care Tracy Kinner: Lynnea Ferrier Other Clinician: Referring Jeromiah Ohalloran: Treating Breonna Gafford/Extender: [name] in Treatment: 1 Wound Status Wound Number: 1 Primary Venous Leg Ulcer Etiology: Wound Location: Right, Lateral Lower Leg Wound Open Wounding Event: Gradually Appeared Status: Date Acquired: 10/31/2022 Comorbid Chronic Obstructive Pulmonary Disease (COPD), Angina, Weeks Of Treatment: 1 History: Arrhythmia, Coronary Artery Disease, Hypertension, Myocardial Clustered Wound: No Infarction, Peripheral Venous Disease Photos Wound Measurements Length: (cm) 3.8 Width: (cm) 2.5 Depth: (cm) 0.1 Area: (cm) 7.461 Volume: (cm) 0.746 % Reduction in Area: 20.8% % Reduction in Volume: 20.8% Epithelialization: None Tunneling: No Undermining: No Wound Description Classification: Full Thickness Without Exposed Support Wound Margin: Distinct, outline attached Exudate Amount:  Medium Exudate Type: Serosanguineous Exudate Color: red, brown Structures Foul Odor After Cleansing: No Slough/Fibrino Yes Wound Bed Granulation Amount: Small (1-33%) Exposed Structure Granulation Quality: Red Fascia Exposed: No Necrotic Amount: Large (67-100%) Fat Layer (Subcutaneous Tissue) Exposed: Yes Necrotic Quality: Eschar, Adherent Slough Tendon Exposed: No Muscle Exposed: No Joint Exposed: No Bone Exposed: No LANGFORD, CARIAS ([id number]) [id number] Page 9 of 12 Periwound Skin Texture Texture Color No Abnormalities Noted: Yes No Abnormalities Noted: No Hemosiderin Staining: Yes Moisture No Abnormalities Noted: Yes Temperature / Pain Temperature: No Abnormality Treatment Notes Wound #1 (Lower Leg) Wound Laterality: Right, Lateral Cleanser Soap and Water Discharge Instruction: May shower and wash wound with dial antibacterial soap and water prior to dressing change. Wound Cleanser Discharge Instruction: Cleanse the wound with wound cleanser prior to applying a clean dressing using gauze sponges, not tissue or cotton balls. Peri-Wound Care Sween Lotion (Moisturizing lotion) Discharge Instruction: Apply moisturizing lotion as directed Topical Primary Dressing Maxorb Extra Ag+ Alginate Dressing, 2x2 (in/in) Discharge Instruction: Apply to wound bed as instructed Secondary Dressing ABD Pad, 5x9 Discharge Instruction: Apply over primary dressing as directed. Secured  With Compression Wrap Urgo K2 Lite, (equivalent to a 3 layer) two layer compression system, regular Discharge Instruction: Apply Urgo K2 Lite as directed (alternative to 3 layer compression). Compression Stockings Circaid Juxta Lite Compression Wrap Quantity: 1 Right Leg Compression Amount: 30-40 mmHg Discharge Instruction: Apply Circaid Juxta Lite Compression Wrap daily as instructed. Apply first thing in the morning, remove at night before bed. Add-Ons Electronic  Signature(s) Signed: 01/31/2023 12:02:50 PM By: Karie Schwalbe RN Entered By: Karie Schwalbe on 01/31/2023 07:03:39 -------------------------------------------------------------------------------- Wound Assessment Details Patient Name: Date of Service: [name], [name] 01/31/2023 9:30 A M Medical Record Number: [id number] Patient Account Number: [id number] Date of Birth/Sex: Treating RN: [birth year] (67 y.o. M) Primary Care Reba Hulett: Lynnea Ferrier Other Clinician: Referring Jeannette Maddy: Treating Tove Wideman/Extender: [name] in Treatment: 1 Wound Status Wound Number: 2 Primary Venous Leg Ulcer Etiology: Wound Location: Left, Lateral Lower Leg Wound Open Wounding Event: Gradually Appeared Status: Date Acquired: 01/13/2023 Comorbid Chronic Obstructive Pulmonary Disease (COPD), Angina, Weeks Of Treatment: 1 History: Arrhythmia, Coronary Artery Disease, Hypertension, Myocardial Clustered Wound: No Infarction, Peripheral Venous Disease DIMA, FERRUFINO ([id number]) [id number] Page 10 of 12 Photos Wound Measurements Length: (cm) 1.2 Width: (cm) 1 Depth: (cm) 0.1 Area: (cm) 0.942 Volume: (cm) 0.094 % Reduction in Area: 16.1% % Reduction in Volume: 16.1% Epithelialization: Small (1-33%) Tunneling: No Undermining: No Wound Description Classification: Full Thickness Without Exposed Support Structures Wound Margin: Distinct, outline attached Exudate Amount: Medium Exudate Type: Serosanguineous Exudate Color: red, brown Foul Odor After Cleansing: No Slough/Fibrino Yes Wound Bed Granulation Amount: Small (1-33%) Exposed Structure Granulation Quality: Red, Hyper-granulation Fascia Exposed: No Necrotic Amount: Large (67-100%) Fat Layer (Subcutaneous Tissue) Exposed: Yes Necrotic Quality: Eschar, Adherent Slough Tendon Exposed: No Muscle Exposed: No Joint Exposed: No Bone Exposed: No Periwound Skin Texture Texture Color No  Abnormalities Noted: Yes No Abnormalities Noted: No Hemosiderin Staining: Yes Moisture No Abnormalities Noted: Yes Temperature / Pain Temperature: No Abnormality Treatment Notes Wound #2 (Lower Leg) Wound Laterality: Left, Lateral Cleanser Soap and Water Discharge Instruction: May shower and wash wound with dial antibacterial soap and water prior to dressing change. Wound Cleanser Discharge Instruction: Cleanse the wound with wound cleanser prior to applying a clean dressing using gauze sponges, not tissue or cotton balls. Peri-Wound Care Sween Lotion (Moisturizing lotion) Discharge Instruction: Apply moisturizing lotion as directed Topical Primary Dressing Maxorb Extra Ag+ Alginate Dressing, 2x2 (in/in) Discharge Instruction: Apply to wound bed as instructed Secondary Dressing ABD Pad, 5x9 Discharge Instruction: Apply over primary dressing as directed. Secured With Compression Wrap Urgo K2 Lite, (equivalent to a 3 layer) two layer compression system, regular Discharge Instruction: Apply Urgo K2 Lite as directed (alternative to 3 layer compression). SIONE, BAUMGARTEN ([id number]) [id number] Page 11 of 12 Compression Stockings Circaid Juxta Lite Compression Wrap Quantity: 1 Left Leg Compression Amount: 30-40 mmHg Discharge Instruction: Apply Circaid Juxta Lite Compression Wrap daily as instructed. Apply first thing in the morning, remove at night before bed. Add-Ons Electronic Signature(s) Signed: 01/31/2023 12:02:50 PM By: Karie Schwalbe RN Entered By: Karie Schwalbe on 01/31/2023 07:04:15 -------------------------------------------------------------------------------- Wound Assessment Details Patient Name: Date of Service: [name], [name] 01/31/2023 9:30 A M Medical Record Number: [id number] Patient Account Number: [id number] Date of Birth/Sex: Treating RN: 1955-07-03 (67 y.o. [name] Primary Care Demetria Iwai: Lynnea Ferrier Other  Clinician: Referring Macklyn Glandon: Treating Anvika Gashi/Extender: [name] in Treatment: 1 Wound Status Wound Number: 3 Primary Venous Leg Ulcer Etiology: Wound Location: Left, Medial Lower Leg Wound  Healed - Epithelialized Wounding Event: Gradually Appeared Status: Date Acquired: 01/16/2023 Comorbid Chronic Obstructive Pulmonary Disease (COPD), Angina, Weeks Of Treatment: 1 History: Arrhythmia, Coronary Artery Disease, Hypertension, Myocardial Clustered Wound: No Infarction, Peripheral Venous Disease Photos Wound Measurements Length: (cm) Width: (cm) Depth: (cm) Area: (cm) Volume: (cm) 0 % Reduction in Area: 100% 0 % Reduction in Volume: 100% 0 Epithelialization: Large (67-100%) 0 Tunneling: No 0 Undermining: No Wound Description Classification: Full Thickness Without Exposed Support Structures Wound Margin: Distinct, outline attached Exudate Amount: Medium Exudate Type: Serosanguineous Exudate Color: red, brown Foul Odor After Cleansing: No Slough/Fibrino Yes Wound Bed Granulation Amount: Small (1-33%) Exposed Structure Granulation Quality: Red Fascia Exposed: No Necrotic Amount: Large (67-100%) Fat Layer (Subcutaneous Tissue) Exposed: Yes Necrotic Quality: Eschar Tendon Exposed: No Muscle Exposed: No Joint Exposed: No HERRICK, HARTOG ([id number]) [id number] Page 12 of 12 Bone Exposed: No Periwound Skin Texture Texture Color No Abnormalities Noted: Yes No Abnormalities Noted: No Hemosiderin Staining: Yes Moisture No Abnormalities Noted: Yes Temperature / Pain Temperature: No Abnormality Treatment Notes Wound #3 (Lower Leg) Wound Laterality: Left, Medial Cleanser Peri-Wound Care Topical Primary Dressing Secondary Dressing Secured With Compression Wrap Compression Stockings Add-Ons Electronic Signature(s) Signed: 01/31/2023 1:05:52 PM By: Zenaida Deed RN, BSN Entered By: Zenaida Deed on  01/31/2023 07:12:17 -------------------------------------------------------------------------------- Vitals Details Patient Name: Date of Service: [name], [name] 01/31/2023 9:30 A M Medical Record Number: [id number] Patient Account Number: [id number] Date of Birth/Sex: Treating RN: 1955-11-02 (67 y.o. M) Primary Care Deshae Dickison: Lynnea Ferrier Other Clinician: Referring Travas Schexnayder: Treating Chiffon Kittleson/Extender: [name] in Treatment: 1 Vital Signs Time Taken: 09:50 Temperature (F): 97.7 Height (in): 68 Pulse (bpm): 80 Weight (lbs): 208 Respiratory Rate (breaths/min): 20 Body Mass Index (BMI): 31.6 Blood Pressure (mmHg): 145/78 Reference Range: 80 - 120 mg / dl Electronic Signature(s) Signed: 01/31/2023 10:12:47 AM By: Dayton Scrape Entered By: Dayton Scrape on 01/31/2023 06:51:18

## 2023-02-03 ENCOUNTER — Ambulatory Visit (HOSPITAL_BASED_OUTPATIENT_CLINIC_OR_DEPARTMENT_OTHER): Payer: Medicare Other | Admitting: Internal Medicine

## 2023-02-05 ENCOUNTER — Encounter (HOSPITAL_BASED_OUTPATIENT_CLINIC_OR_DEPARTMENT_OTHER): Payer: Medicare Other | Admitting: General Surgery

## 2023-02-05 DIAGNOSIS — I87333 Chronic venous hypertension (idiopathic) with ulcer and inflammation of bilateral lower extremity: Secondary | ICD-10-CM | POA: Diagnosis not present

## 2023-02-05 NOTE — Progress Notes (Signed)
GRADIE, [facility] ([id number]) [id number] Page 1 of 8 Visit Report for 02/05/2023 Arrival Information Details Patient Name: Date of Service: [name] [name] 02/05/2023 10:15 A M Medical Record Number: [id number] Patient Account Number: [id number] Date of Birth/Sex: Treating RN: 1956/02/23 (67 y.o. M) Primary Care Jeremy Mclamb: Lynnea Ferrier Other Clinician: Referring Aarica Wax: Treating Camela Wich/Extender: [name] in Treatment: 1 Visit Information History Since Last Visit Added or deleted any medications: No Patient Arrived: Ambulatory Any new allergies or adverse reactions: No Arrival Time: 10:11 Had a fall or experienced change in No Accompanied By: self activities of daily living that may affect Transfer Assistance: None risk of falls: Patient Identification Verified: Yes Signs or symptoms of abuse/neglect since last visito No Secondary Verification Process Completed: Yes Hospitalized since last visit: No Implantable device outside of the clinic excluding No cellular tissue based products placed in the center since last visit: Pain Present Now: No Electronic Signature(s) Signed: 02/05/2023 11:19:18 AM By: Dayton Scrape Entered By: Dayton Scrape on 02/05/2023 07:11:47 -------------------------------------------------------------------------------- Encounter Discharge Information Details Patient Name: Date of Service: [name], [place] [name] 02/05/2023 10:15 A M Medical Record Number: [id number] Patient Account Number: [id number] Date of Birth/Sex: Treating RN: 11/27/1955 (67 y.o. Cline Cools Primary Care Jaime Dome: Lynnea Ferrier Other Clinician: Referring Lavante Toso: Treating Barbarita Hutmacher/Extender: [name] in Treatment: 1 Encounter Discharge Information Items Post Procedure Vitals Discharge Condition: Stable Temperature (F): 97.8 Ambulatory Status: Ambulatory Pulse (bpm): 79 Discharge Destination:  Home Respiratory Rate (breaths/min): 18 Transportation: Private Auto Blood Pressure (mmHg): 129/72 Accompanied By: self Schedule Follow-up Appointment: Yes Clinical Summary of Care: Patient Declined Electronic Signature(s) Signed: 02/05/2023 3:58:55 PM By: Redmond Pulling RN, BSN Entered By: Redmond Pulling on 02/05/2023 08:57:58 [name] ([id number]) [id number] Page 2 of 8 -------------------------------------------------------------------------------- Lower Extremity Assessment Details Patient Name: Date of Service: [name] [name] 02/05/2023 10:15 A M Medical Record Number: [id number] Patient Account Number: [id number] Date of Birth/Sex: Treating RN: [birth year] (67 y.o. Cline Cools Primary Care Gregoria Selvy: Lynnea Ferrier Other Clinician: Referring Shawnese Magner: Treating Buford Gayler/Extender: [name] in Treatment: 1 Edema Assessment Assessed: [Left: No] [Right: No] [Left: Edema] [Right: :] Calf Left: Right: Point of Measurement: From Medial Instep 35.5 cm 37.5 cm Ankle Left: Right: Point of Measurement: From Medial Instep 23.3 cm 22 cm Vascular Assessment Pulses: Dorsalis Pedis Palpable: [Left:Yes] [Right:Yes] Extremity colors, hair growth, and conditions: Extremity Color: [Left:Hyperpigmented] [Right:Hyperpigmented] Hair Growth on Extremity: [Left:No] [Right:No] Temperature of Extremity: [Left:Cool] [Right:Cool] Capillary Refill: [Left:< 3 seconds] [Right:< 3 seconds] Dependent Rubor: [Left:No No] [Right:No No] Electronic Signature(s) Signed: 02/05/2023 3:58:55 PM By: Redmond Pulling RN, BSN Entered By: Redmond Pulling on 02/05/2023 07:25:20 -------------------------------------------------------------------------------- Multi-Disciplinary Care Plan Details Patient Name: Date of Service: [name], RO [name] 02/05/2023 10:15 A M Medical Record Number: [id number] Patient Account Number: [id number] Date of Birth/Sex:  Treating RN: 15-Jan-1956 (67 y.o. M) Primary Care Shameeka Silliman: Lynnea Ferrier Other Clinician: Referring Sarath Privott: Treating Rosser Collington/Extender: [name] in Treatment: 1 Multidisciplinary Care Plan reviewed with physician Active Inactive Venous Leg Ulcer Nursing Diagnoses: Actual venous Insuffiency (use after diagnosis is confirmed) Knowledge deficit related to disease process and management [name], [name] ([id number]) [id number] Page 3 of 8 Goals: Patient will maintain optimal edema control Date Initiated: 01/23/2023 Target Resolution Date: 03/07/2023 Goal Status: Active Patient/caregiver will verbalize understanding of disease process and disease management Date Initiated: 01/23/2023 Target Resolution Date: 03/07/2023 Goal Status: Active Interventions: Assess peripheral edema status every visit. Compression as  ordered Provide education on venous insufficiency Treatment Activities: Therapeutic compression applied : 01/23/2023 Notes: Wound/Skin Impairment Nursing Diagnoses: Impaired tissue integrity Knowledge deficit related to ulceration/compromised skin integrity Goals: Patient/caregiver will verbalize understanding of skin care regimen Date Initiated: 01/23/2023 Target Resolution Date: 03/07/2023 Goal Status: Active Interventions: Assess ulceration(s) every visit Treatment Activities: Skin care regimen initiated : 01/23/2023 Topical wound management initiated : 01/23/2023 Notes: Electronic Signature(s) Signed: 02/05/2023 11:19:18 AM By: Dayton Scrape Entered By: Dayton Scrape on 02/05/2023 07:35:05 -------------------------------------------------------------------------------- Pain Assessment Details Patient Name: Date of Service: [name] [name] 02/05/2023 10:15 A M Medical Record Number: [id number] Patient Account Number: [id number] Date of Birth/Sex: Treating RN: 05/05/1955 (67 y.o. M) Primary Care Anton Cheramie:  Lynnea Ferrier Other Clinician: Referring Emmalin Jaquess: Treating Santasia Rew/Extender: [name] in Treatment: 1 Active Problems Location of Pain Severity and Description of Pain Patient Has Paino No Site Locations [place], [place] ([id number]) [id number] Page 4 of 8 Pain Management and Medication Current Pain Management: Electronic Signature(s) Signed: 02/05/2023 11:19:18 AM By: Dayton Scrape Entered By: Dayton Scrape on 02/05/2023 07:12:48 -------------------------------------------------------------------------------- Patient/Caregiver Education Details Patient Name: Date of Service: [name], RO [name] 11/6/2024andnbsp10:15 A M Medical Record Number: [id number] Patient Account Number: [id number] Date of Birth/Gender: Treating RN: 06-29-1955 (67 y.o. M) Primary Care Physician: Lynnea Ferrier Other Clinician: Referring Physician: Treating Physician/Extender: [name] in Treatment: 1 Education Assessment Education Provided To: Patient Education Topics Provided Wound/Skin Impairment: Methods: Explain/Verbal Responses: State content correctly Electronic Signature(s) Signed: 02/05/2023 11:19:18 AM By: Dayton Scrape Entered By: Dayton Scrape on 02/05/2023 07:35:24 -------------------------------------------------------------------------------- Wound Assessment Details Patient Name: Date of Service: [name] [name] 02/05/2023 10:15 A M Medical Record Number: [id number] Patient Account Number: [id number] Date of Birth/Sex: Treating RN: [birth year] (67 y.o. M) Primary Care Tilmon Wisehart: Lynnea Ferrier Other Clinician: Referring Noriah Osgood: Treating Teryn Gust/Extender: Matthew Folks [place], Molly Maduro ([id number]) [id number] Page 5 of 8 Weeks in Treatment: 1 Wound Status Wound Number: 1 Primary Venous Leg Ulcer Etiology: Wound Location: Right, Lateral Lower Leg Wound  Open Wounding Event: Gradually Appeared Status: Date Acquired: 10/31/2022 Comorbid Chronic Obstructive Pulmonary Disease (COPD), Angina, Weeks Of Treatment: 1 History: Arrhythmia, Coronary Artery Disease, Hypertension, Myocardial Clustered Wound: No Infarction, Peripheral Venous Disease Photos Wound Measurements Length: (cm) 3.8 Width: (cm) 2.5 Depth: (cm) 0.1 Area: (cm) 7.461 Volume: (cm) 0.746 % Reduction in Area: 20.8% % Reduction in Volume: 20.8% Epithelialization: Large (67-100%) Tunneling: No Undermining: No Wound Description Classification: Full Thickness Without Exposed Suppor Wound Margin: Distinct, outline attached Exudate Amount: Medium Exudate Type: Serosanguineous Exudate Color: red, brown t Structures Foul Odor After Cleansing: No Slough/Fibrino Yes Wound Bed Granulation Amount: Large (67-100%) Exposed Structure Granulation Quality: Red Fascia Exposed: No Necrotic Amount: Small (1-33%) Fat Layer (Subcutaneous Tissue) Exposed: Yes Necrotic Quality: Eschar Tendon Exposed: No Muscle Exposed: No Joint Exposed: No Bone Exposed: No Periwound Skin Texture Texture Color No Abnormalities Noted: Yes No Abnormalities Noted: No Hemosiderin Staining: Yes Moisture No Abnormalities Noted: Yes Temperature / Pain Temperature: No Abnormality Treatment Notes Wound #1 (Lower Leg) Wound Laterality: Right, Lateral Cleanser Soap and Water Discharge Instruction: May shower and wash wound with dial antibacterial soap and water prior to dressing change. Wound Cleanser Discharge Instruction: Cleanse the wound with wound cleanser prior to applying a clean dressing using gauze sponges, not tissue or cotton balls. Peri-Wound Care Sween Lotion (Moisturizing lotion) Discharge Instruction: Apply moisturizing lotion as directed Topical Gentamicin Discharge Instruction: As directed by physician [name] ([id number]) [id number] Page 6  of  8 Mupirocin Ointment Discharge Instruction: Apply Mupirocin (Bactroban) as instructed Primary Dressing Maxorb Extra Ag+ Alginate Dressing, 2x2 (in/in) Discharge Instruction: Apply to wound bed as instructed Secondary Dressing ABD Pad, 5x9 Discharge Instruction: Apply over primary dressing as directed. Secured With Compression Wrap Urgo K2 Lite, (equivalent to a 3 layer) two layer compression system, regular Discharge Instruction: Apply Urgo K2 Lite as directed (alternative to 3 layer compression). Compression Stockings Circaid Juxta Lite Compression Wrap Quantity: 1 Right Leg Compression Amount: 30-40 mmHg Discharge Instruction: Apply Circaid Juxta Lite Compression Wrap daily as instructed. Apply first thing in the morning, remove at night before bed. Add-Ons Electronic Signature(s) Signed: 02/05/2023 3:58:55 PM By: Redmond Pulling RN, BSN Entered By: Redmond Pulling on 02/05/2023 07:34:04 -------------------------------------------------------------------------------- Wound Assessment Details Patient Name: Date of Service: [name], RO [name] 02/05/2023 10:15 A M Medical Record Number: [id number] Patient Account Number: [id number] Date of Birth/Sex: Treating RN: 29-Jul-1955 (67 y.o. M) Primary Care Ermalinda Joubert: Lynnea Ferrier Other Clinician: Referring Tuyen Uncapher: Treating Richell Corker/Extender: [name] in Treatment: 1 Wound Status Wound Number: 2 Primary Venous Leg Ulcer Etiology: Wound Location: Left, Lateral Lower Leg Wound Open Wounding Event: Gradually Appeared Status: Date Acquired: 01/13/2023 Comorbid Chronic Obstructive Pulmonary Disease (COPD), Angina, Weeks Of Treatment: 1 History: Arrhythmia, Coronary Artery Disease, Hypertension, Myocardial Clustered Wound: No Infarction, Peripheral Venous Disease Photos Wound Measurements Length: (cm) 1.1 Width: (cm) 1.4 Depth: (cm) 0.1 Area: (cm) 1.21 Akey, Yacob ([id number]) Volume: (cm) 0.121 %  Reduction in Area: -7.7% % Reduction in Volume: -8% Epithelialization: Small (1-33%) Tunneling: No [id number] Page 7 of 8 Undermining: No Wound Description Classification: Full Thickness Without Exposed Support Structures Wound Margin: Distinct, outline attached Exudate Amount: Medium Exudate Type: Serosanguineous Exudate Color: red, brown Foul Odor After Cleansing: No Slough/Fibrino Yes Wound Bed Granulation Amount: Large (67-100%) Exposed Structure Granulation Quality: Red, Hyper-granulation Fascia Exposed: No Necrotic Amount: Small (1-33%) Fat Layer (Subcutaneous Tissue) Exposed: Yes Necrotic Quality: Eschar Tendon Exposed: No Muscle Exposed: No Joint Exposed: No Bone Exposed: No Periwound Skin Texture Texture Color No Abnormalities Noted: Yes No Abnormalities Noted: No Hemosiderin Staining: Yes Moisture No Abnormalities Noted: Yes Temperature / Pain Temperature: No Abnormality Treatment Notes Wound #2 (Lower Leg) Wound Laterality: Left, Lateral Cleanser Soap and Water Discharge Instruction: May shower and wash wound with dial antibacterial soap and water prior to dressing change. Wound Cleanser Discharge Instruction: Cleanse the wound with wound cleanser prior to applying a clean dressing using gauze sponges, not tissue or cotton balls. Peri-Wound Care Sween Lotion (Moisturizing lotion) Discharge Instruction: Apply moisturizing lotion as directed Topical Gentamicin Discharge Instruction: As directed by physician Mupirocin Ointment Discharge Instruction: Apply Mupirocin (Bactroban) as instructed Primary Dressing Maxorb Extra Ag+ Alginate Dressing, 2x2 (in/in) Discharge Instruction: Apply to wound bed as instructed Secondary Dressing ABD Pad, 5x9 Discharge Instruction: Apply over primary dressing as directed. Secured With Compression Wrap Urgo K2 Lite, (equivalent to a 3 layer) two layer compression system, regular Discharge  Instruction: Apply Urgo K2 Lite as directed (alternative to 3 layer compression). Compression Stockings Circaid Juxta Lite Compression Wrap Quantity: 1 Left Leg Compression Amount: 30-40 mmHg Discharge Instruction: Apply Circaid Juxta Lite Compression Wrap daily as instructed. Apply first thing in the morning, remove at night before bed. Add-Ons Electronic Signature(s) Signed: 02/05/2023 3:58:55 PM By: Redmond Pulling RN, BSN Entered By: Redmond Pulling on 02/05/2023 07:34:38 [name] ([id number]) [id number] Page 8 of 8 -------------------------------------------------------------------------------- Vitals Details Patient Name: Date of Service: [name], [place] [name] 02/05/2023 10:15  A M Medical Record Number: [id number] Patient Account Number: [id number] Date of Birth/Sex: Treating RN: 03-Mar-1956 (67 y.o. M) Primary Care Madeleyn Schwimmer: Lynnea Ferrier Other Clinician: Referring Sagrario Lineberry: Treating Toddrick Sanna/Extender: [name] in Treatment: 1 Vital Signs Time Taken: 10:12 Temperature (F): 97.8 Height (in): 68 Pulse (bpm): 79 Weight (lbs): 208 Respiratory Rate (breaths/min): 20 Body Mass Index (BMI): 31.6 Blood Pressure (mmHg): 129/72 Reference Range: 80 - 120 mg / dl Electronic Signature(s) Signed: 02/05/2023 11:19:18 AM By: Dayton Scrape Entered By: Dayton Scrape on 02/05/2023 07:12:22

## 2023-02-05 NOTE — Progress Notes (Addendum)
SHYHIEM, BEENEY ([id number]) [id number] Page 1 of 11 Visit Report for 02/05/2023 Chief Complaint Document Details Patient Name: Date of Service: [name] 02/05/2023 10:15 A M Medical Record Number: [id number] Patient Account Number: [id number] Date of Birth/Sex: Treating RN: [birth year] (67 y.o. M) Primary Care Provider: Lynnea Ferrier Other Clinician: Referring Provider: Treating Provider/Extender: [name] in Treatment: 1 Information Obtained from: Patient Chief Complaint Patient presents for treatment of open ulcers due to venous insufficiency Electronic Signature(s) Signed: 02/05/2023 10:21:38 AM By: Duanne Guess MD FACS Entered By: Duanne Guess on 02/05/2023 07:21:38 -------------------------------------------------------------------------------- Debridement Details Patient Name: Date of Service: [name] 02/05/2023 10:15 A M Medical Record Number: [id number] Patient Account Number: [id number] Date of Birth/Sex: Treating RN: 12/10/1955 (67 y.o. Cline Cools Primary Care Provider: Lynnea Ferrier Other Clinician: Referring Provider: Treating Provider/Extender: [name] in Treatment: 1 Debridement Performed for Assessment: Wound #1 Right,Lateral Lower Leg Performed By: Physician Duanne Guess, MD The following information was scribed by: Redmond Pulling The information was scribed for: Duanne Guess Debridement Type: Debridement Severity of Tissue Pre Debridement: Fat layer exposed Level of Consciousness (Pre-procedure): Awake and Alert Pre-procedure Verification/Time Out Yes - 10:40 Taken: Start Time: 10:42 Pain Control: Lidocaine 5% topical ointment Percent of Wound Bed Debrided: 100% T Area Debrided (cm): otal 7.46 Tissue and other material debrided: Non-Viable, Slough, Subcutaneous, Slough Level: Skin/Subcutaneous Tissue Debridement Description:  Excisional Instrument: Curette Bleeding: Minimum Hemostasis Achieved: Pressure Response to Treatment: Procedure was tolerated well Level of Consciousness (Post- Awake and Alert procedure): Post Debridement Measurements of Total Wound Length: (cm) 3.8 Width: (cm) 2.5 Depth: (cm) 0.1 Volume: (cm) 0.746 Character of Wound/Ulcer Post Debridement: Improved Severity of Tissue Post Debridement: Fat layer exposed [name] ([id number]) [id number] Page 2 of 11 Post Procedure Diagnosis Same as Pre-procedure Electronic Signature(s) Signed: 02/05/2023 12:00:21 PM By: Duanne Guess MD FACS Signed: 02/05/2023 3:58:55 PM By: Redmond Pulling RN, BSN Entered By: Redmond Pulling on 02/05/2023 07:43:40 -------------------------------------------------------------------------------- Debridement Details Patient Name: Date of Service: [name] 02/05/2023 10:15 A M Medical Record Number: [id number] Patient Account Number: [id number] Date of Birth/Sex: Treating RN: [birth year] (67 y.o. Cline Cools Primary Care Provider: Lynnea Ferrier Other Clinician: Referring Provider: Treating Provider/Extender: [name] in Treatment: 1 Debridement Performed for Assessment: Wound #2 Left,Lateral Lower Leg Performed By: Physician Duanne Guess, MD Debridement Type: Debridement Severity of Tissue Pre Debridement: Fat layer exposed Level of Consciousness (Pre-procedure): Awake and Alert Pre-procedure Verification/Time Out Yes - 10:40 Taken: Start Time: 10:42 Pain Control: Lidocaine 5% topical ointment Percent of Wound Bed Debrided: 100% T Area Debrided (cm): otal 1.21 Tissue and other material debrided: Non-Viable, Slough, Subcutaneous, Slough Level: Skin/Subcutaneous Tissue Debridement Description: Excisional Instrument: Curette Bleeding: Minimum Hemostasis Achieved: Pressure Response to Treatment: Procedure was tolerated  well Level of Consciousness (Post- Awake and Alert procedure): Post Debridement Measurements of Total Wound Length: (cm) 1.1 Width: (cm) 1.4 Depth: (cm) 0.1 Volume: (cm) 0.121 Character of Wound/Ulcer Post Debridement: Improved Severity of Tissue Post Debridement: Fat layer exposed Post Procedure Diagnosis Same as Pre-procedure Electronic Signature(s) Signed: 02/05/2023 12:00:21 PM By: Duanne Guess MD FACS Signed: 02/05/2023 3:58:55 PM By: Redmond Pulling RN, BSN Entered By: Redmond Pulling on 02/05/2023 07:44:13 -------------------------------------------------------------------------------- HPI Details Patient Name: Date of Service: [name] 02/05/2023 10:15 A M Medical Record Number: [id number] Patient Account Number: [id number] [name] ([id number]) [id number] Page 3 of 11 Date of Birth/Sex: Treating RN:  06-11-1955 (67 y.o. M) Primary Care Provider: Other Clinician: Lynnea Ferrier Referring Provider: Treating Provider/Extender: [name] in Treatment: 1 History of Present Illness HPI Description: ADMISSION 01/23/2023 ***VASCULAR STUDIES:*** ABIs 11/2021: +-------+-----------+-----------+------------+------------+ ABI/TBIT oday's ABIT oday's TBIPrevious ABIPrevious TBI +-------+-----------+-----------+------------+------------+ Right 1.08 0.35 0.82 0.29  +-------+-----------+-----------+------------+------------+ Left 0.97 0.27 0.88 0.27  +-------+-----------+-----------+------------+------------+ Venous reflux 03/2021 Venous Reflux Times +--------------+---------+------+-----------+------------+--------+ RIGHT Reflux NoRefluxReflux TimeDiameter cmsComments    Yes     +--------------+---------+------+-----------+------------+--------+ CFV   yes  >1 second    +--------------+---------+------+-----------+------------+--------+ FV prox   yes  >1 second     +--------------+---------+------+-----------+------------+--------+ FV mid   yes  >1 second    +--------------+---------+------+-----------+------------+--------+ FV dist   yes  >1 second    +--------------+---------+------+-----------+------------+--------+ Popliteal   yes  >1 second    +--------------+---------+------+-----------+------------+--------+ GSV at SFJ   yes  >500 ms  0.736   +--------------+---------+------+-----------+------------+--------+ GSV prox thigh  yes  >500 ms  0.591   +--------------+---------+------+-----------+------------+--------+ GSV mid thigh   yes  >500 ms  0.591   +--------------+---------+------+-----------+------------+--------+ GSV dist thigh  yes  >500 ms  0.546   +--------------+---------+------+-----------+------------+--------+ GSV at knee no    0.592   +--------------+---------+------+-----------+------------+--------+ GSV prox calf     0.441   +--------------+---------+------+-----------+------------+--------+ GSV mid calf     0.461   +--------------+---------+------+-----------+------------+--------+ SSV Pop Fossa no    0.406   +--------------+---------+------+-----------+------------+--------+ SSV prox calf   yes  >500 ms  0.379   +--------------+---------+------+-----------+------------+--------+ SSV mid calf     0.379   +--------------+---------+------+-----------+------------+--------+ Summary: Right: - No evidence of deep vein thrombosis seen in the right lower extremity, from the common femoral through the popliteal veins. - No evidence of superficial venous thrombosis in the right lower extremity. - Venous reflux is noted in the right common femoral vein. - Venous reflux is noted in the right sapheno-femoral junction. - Venous reflux is noted in the right greater saphenous vein in the thigh. - Venous reflux is noted in the right femoral  vein. - Venous reflux is noted in the right popliteal vein. - Venous reflux is noted in the right short saphenous vein. This is a 67 year old smoker with a significant history of cardiac and peripheral vascular disease. He has had issues with venous ulceration on his right leg that have resolved with local care in the past, but the most recent wound has been present for a couple of months and has not healed despite treatment with Silvadene cream and home application of Unna boots. He also has developed 2 small ulcers on the left that have presented within the past couple of weeks. Of note, his right greater saphenous vein was harvested in 2023 for femoral above-knee popliteal bypass. He was referred here by his primary care provider for further evaluation and management of his lower extremity venous ulcers. 01/31/2023: The left medial lower leg wound is healed. The left lateral and right lateral lower leg wounds are both smaller. They have slough accumulation on their surfaces. Edema control is excellent. 02/05/2023: Both wounds are just slightly smaller. They are cleaner, however, with a bit of slough accumulation on the surfaces. Edema control remains good. Electronic Signature(s) Signed: 02/05/2023 10:46:15 AM By: Duanne Guess MD FACS Entered By: Duanne Guess on 02/05/2023 07:46:14 [name] ([id number]) [id number] Page 4 of 11 -------------------------------------------------------------------------------- Physical Exam Details Patient Name: Date of Service: [name] 02/05/2023 10:15 A M Medical Record Number: [id number] Patient Account Number: [id number] Date of Birth/Sex: Treating RN: 02/20/1956 (67 y.o. M) Primary Care  Provider: Lynnea Ferrier Other Clinician: Referring Provider: Treating Provider/Extender: [name] in Treatment: 1 Constitutional . . . . no acute distress. Respiratory Normal work of  breathing on room air.. Notes 02/05/2023: Both wounds are just slightly smaller. They are cleaner, however, with a bit of slough accumulation on the surfaces. Edema control remains good. Electronic Signature(s) Signed: 02/05/2023 10:47:03 AM By: Duanne Guess MD FACS Entered By: Duanne Guess on 02/05/2023 07:47:02 -------------------------------------------------------------------------------- Physician Orders Details Patient Name: Date of Service: [name] 02/05/2023 10:15 A M Medical Record Number: [id number] Patient Account Number: [id number] Date of Birth/Sex: Treating RN: [birth year] (67 y.o. M) Primary Care Provider: Lynnea Ferrier Other Clinician: Referring Provider: Treating Provider/Extender: [name] in Treatment: 1 Verbal / Phone Orders: No Diagnosis Coding ICD-10 Coding Code Description [phone] Non-pressure chronic ulcer of other part of right lower leg with fat layer exposed L97.822 Non-pressure chronic ulcer of other part of left lower leg with fat layer exposed I87.333 Chronic venous hypertension (idiopathic) with ulcer and inflammation of bilateral lower extremity I73.9 Peripheral vascular disease, unspecified Follow-up Appointments ppointment in 1 week. - Dr. Lady Gary - room 2 Return A 11/15 @ 8:15 am Anesthetic (In clinic) Topical Lidocaine 4% applied to wound bed Bathing/ Shower/ Hygiene May shower with protection but do not get wound dressing(s) wet. Protect dressing(s) with water repellant cover (for example, large plastic bag) or a cast cover and may then take shower. Additional Orders / Instructions Follow Nutritious Diet - vitamin C 500 mg start once a day and if tolerated then twice a day, zinc 30-50 mg once daily, protein shakes daily Wound Treatment Wound #1 - Lower Leg Wound Laterality: Right, Lateral Cleanser: Soap and Water 1 x Per Week/30 Days DARIYON, URQUILLA ([id number])  [id number] Page 5 of 11 Discharge Instructions: May shower and wash wound with dial antibacterial soap and water prior to dressing change. Cleanser: Wound Cleanser 1 x Per Week/30 Days Discharge Instructions: Cleanse the wound with wound cleanser prior to applying a clean dressing using gauze sponges, not tissue or cotton balls. Peri-Wound Care: Sween Lotion (Moisturizing lotion) 1 x Per Week/30 Days Discharge Instructions: Apply moisturizing lotion as directed Topical: Gentamicin 1 x Per Week/30 Days Discharge Instructions: As directed by physician Topical: Mupirocin Ointment 1 x Per Week/30 Days Discharge Instructions: Apply Mupirocin (Bactroban) as instructed Prim Dressing: Maxorb Extra Ag+ Alginate Dressing, 2x2 (in/in) 1 x Per Week/30 Days ary Discharge Instructions: Apply to wound bed as instructed Secondary Dressing: ABD Pad, 5x9 1 x Per Week/30 Days Discharge Instructions: Apply over primary dressing as directed. Compression Wrap: Urgo K2 Lite, (equivalent to a 3 layer) two layer compression system, regular 1 x Per Week/30 Days Discharge Instructions: Apply Urgo K2 Lite as directed (alternative to 3 layer compression). Compression Stockings: Circaid Juxta Lite Compression Wrap Right Leg Compression Amount: 30-40 mmHG Discharge Instructions: Apply Circaid Juxta Lite Compression Wrap daily as instructed. Apply first thing in the morning, remove at night before bed. Wound #2 - Lower Leg Wound Laterality: Left, Lateral Cleanser: Soap and Water 1 x Per Week/30 Days Discharge Instructions: May shower and wash wound with dial antibacterial soap and water prior to dressing change. Cleanser: Wound Cleanser 1 x Per Week/30 Days Discharge Instructions: Cleanse the wound with wound cleanser prior to applying a clean dressing using gauze sponges, not tissue or cotton balls. Peri-Wound Care: Sween Lotion (Moisturizing lotion) 1 x Per Week/30 Days Discharge  Instructions: Apply moisturizing lotion as directed Topical: Gentamicin  1 x Per Week/30 Days Discharge Instructions: As directed by physician Topical: Mupirocin Ointment 1 x Per Week/30 Days Discharge Instructions: Apply Mupirocin (Bactroban) as instructed Prim Dressing: Maxorb Extra Ag+ Alginate Dressing, 2x2 (in/in) 1 x Per Week/30 Days ary Discharge Instructions: Apply to wound bed as instructed Secondary Dressing: ABD Pad, 5x9 1 x Per Week/30 Days Discharge Instructions: Apply over primary dressing as directed. Compression Wrap: Urgo K2 Lite, (equivalent to a 3 layer) two layer compression system, regular 1 x Per Week/30 Days Discharge Instructions: Apply Urgo K2 Lite as directed (alternative to 3 layer compression). Compression Stockings: Circaid Juxta Lite Compression Wrap Left Leg Compression Amount: 30-40 mmHG Discharge Instructions: Apply Circaid Juxta Lite Compression Wrap daily as instructed. Apply first thing in the morning, remove at night before bed. Patient Medications llergies: No Known Allergies A Notifications Medication Indication Start End 02/05/2023 lidocaine DOSE topical 5 % ointment - ointment topical once daily Electronic Signature(s) Signed: 02/05/2023 12:00:21 PM By: Duanne Guess MD FACS Entered By: Duanne Guess on 02/05/2023 07:47:58 [name] ([id number]) [id number] Page 6 of 11 -------------------------------------------------------------------------------- Problem List Details Patient Name: Date of Service: [name] 02/05/2023 10:15 A M Medical Record Number: [id number] Patient Account Number: [id number] Date of Birth/Sex: Treating RN: 11-04-1955 (67 y.o. M) Primary Care Provider: Lynnea Ferrier Other Clinician: Referring Provider: Treating Provider/Extender: [name] in Treatment: 1 Active Problems ICD-10 Encounter Code Description Active Date MDM Diagnosis L97.812  Non-pressure chronic ulcer of other part of right lower leg with fat layer 01/23/2023 No Yes exposed L97.822 Non-pressure chronic ulcer of other part of left lower leg with fat layer 01/23/2023 No Yes exposed I87.333 Chronic venous hypertension (idiopathic) with ulcer and inflammation of 01/23/2023 No Yes bilateral lower extremity I73.9 Peripheral vascular disease, unspecified 01/23/2023 No Yes Inactive Problems Resolved Problems Electronic Signature(s) Signed: 02/05/2023 10:21:26 AM By: Duanne Guess MD FACS Entered By: Duanne Guess on 02/05/2023 07:21:26 -------------------------------------------------------------------------------- Progress Note Details Patient Name: Date of Service: [name] 02/05/2023 10:15 A M Medical Record Number: [id number] Patient Account Number: [id number] Date of Birth/Sex: Treating RN: 10-21-1955 (67 y.o. M) Primary Care Provider: Lynnea Ferrier Other Clinician: Referring Provider: Treating Provider/Extender: [name] in Treatment: 1 Subjective Chief Complaint Information obtained from Patient Patient presents for treatment of open ulcers due to venous insufficiency History of Present Illness (HPI) ADMISSION 01/23/2023 CURLEY, HOGEN ([id number]) [id number] Page 7 of 11 ***VASCULAR STUDIES:*** ABIs 11/2021: +-------+-----------+-----------+------------+------------+ ABI/TBIT oday's ABIT oday's TBIPrevious ABIPrevious TBI +-------+-----------+-----------+------------+------------+ Right 1.08 0.35 0.82 0.29  +-------+-----------+-----------+------------+------------+ Left 0.97 0.27 0.88 0.27  +-------+-----------+-----------+------------+------------+ Venous reflux 03/2021 Venous Reflux Times +--------------+---------+------+-----------+------------+--------+ RIGHT Reflux NoRefluxReflux TimeDiameter cmsComments    Yes      +--------------+---------+------+-----------+------------+--------+ CFV   yes  >1 second    +--------------+---------+------+-----------+------------+--------+ FV prox   yes  >1 second    +--------------+---------+------+-----------+------------+--------+ FV mid   yes  >1 second    +--------------+---------+------+-----------+------------+--------+ FV dist   yes  >1 second    +--------------+---------+------+-----------+------------+--------+ Popliteal   yes  >1 second    +--------------+---------+------+-----------+------------+--------+ GSV at SFJ   yes  >500 ms  0.736   +--------------+---------+------+-----------+------------+--------+ GSV prox thigh  yes  >500 ms  0.591   +--------------+---------+------+-----------+------------+--------+ GSV mid thigh   yes  >500 ms  0.591   +--------------+---------+------+-----------+------------+--------+ GSV dist thigh  yes  >500 ms  0.546   +--------------+---------+------+-----------+------------+--------+ GSV at knee no    0.592   +--------------+---------+------+-----------+------------+--------+ GSV prox calf     0.441   +--------------+---------+------+-----------+------------+--------+  GSV mid calf     0.461   +--------------+---------+------+-----------+------------+--------+ SSV Pop Fossa no    0.406   +--------------+---------+------+-----------+------------+--------+ SSV prox calf   yes  >500 ms  0.379   +--------------+---------+------+-----------+------------+--------+ SSV mid calf     0.379   +--------------+---------+------+-----------+------------+--------+ Summary: Right: - No evidence of deep vein thrombosis seen in the right lower extremity, from the common femoral through the popliteal veins. - No evidence of superficial venous thrombosis in the right lower extremity. - Venous reflux is noted in the right  common femoral vein. - Venous reflux is noted in the right sapheno-femoral junction. - Venous reflux is noted in the right greater saphenous vein in the thigh. - Venous reflux is noted in the right femoral vein. - Venous reflux is noted in the right popliteal vein. - Venous reflux is noted in the right short saphenous vein. This is a 67 year old smoker with a significant history of cardiac and peripheral vascular disease. He has had issues with venous ulceration on his right leg that have resolved with local care in the past, but the most recent wound has been present for a couple of months and has not healed despite treatment with Silvadene cream and home application of Unna boots. He also has developed 2 small ulcers on the left that have presented within the past couple of weeks. Of note, his right greater saphenous vein was harvested in 2023 for femoral above-knee popliteal bypass. He was referred here by his primary care provider for further evaluation and management of his lower extremity venous ulcers. 01/31/2023: The left medial lower leg wound is healed. The left lateral and right lateral lower leg wounds are both smaller. They have slough accumulation on their surfaces. Edema control is excellent. 02/05/2023: Both wounds are just slightly smaller. They are cleaner, however, with a bit of slough accumulation on the surfaces. Edema control remains good. Patient History Information obtained from Patient, Chart. Family History Cancer - Mother,Child, Diabetes - Maternal Grandparents, Heart Disease - Father, Lung Disease - Mother, Stroke - Father, No family history of Hereditary Spherocytosis, Hypertension, Kidney Disease, Seizures, Thyroid Problems, Tuberculosis. Social History Current every day smoker - 1/2 ppd, Marital Status - Married, Alcohol Use - Rarely, Drug Use - No History, Caffeine Use - Moderate. Medical History Respiratory Patient has history of Chronic Obstructive Pulmonary  Disease (COPD) Cardiovascular Patient has history of Angina, Arrhythmia - a-fib, Coronary Artery Disease, Hypertension, Myocardial Infarction, Peripheral Venous Disease Hospitalization/Surgery History - Incision and drainage of wound (Right). - Application of wound vac. - Femoral artery exploration (Right). - Lower extremity NEFTALI, ABAIR ([id number]) [id number] Page 8 of 11 angiogram (Right). - Femoral-popliteal bypass graft (Right). - Abdominal aortogram w/lower extremity (Bilateral). - Colonoscopy with propofol. - Polypectomy. - Laparoscopic appendectomy. - Coronary angioplasty with stent placement. - Aortic and mitral valve replacement. - Appendectomy. - Cardiac valve replacement. Medical A Surgical History Notes nd Cardiovascular rheumatic heart disease, Hx of artificial heart valve replacement, Mitral insufficiency and aortic stenosis Gastrointestinal hiatal hernia Neurologic stroke Oncologic skin cancer Objective Constitutional no acute distress. Vitals Time Taken: 10:12 AM, Height: 68 in, Weight: 208 lbs, BMI: 31.6, Temperature: 97.8 F, Pulse: 79 bpm, Respiratory Rate: 20 breaths/min, Blood Pressure: 129/72 mmHg. Respiratory Normal work of breathing on room air.. General Notes: 02/05/2023: Both wounds are just slightly smaller. They are cleaner, however, with a bit of slough accumulation on the surfaces. Edema control remains good. Integumentary (Hair, Skin) Wound #1 status is Open. Original cause of wound was Gradually Appeared.  The date acquired was: 10/31/2022. The wound has been in treatment 1 weeks. The wound is located on the Right,Lateral Lower Leg. The wound measures 3.8cm length x 2.5cm width x 0.1cm depth; 7.461cm^2 area and 0.746cm^3 volume. There is Fat Layer (Subcutaneous Tissue) exposed. There is no tunneling or undermining noted. There is a medium amount of serosanguineous drainage noted. The wound margin is distinct with the outline  attached to the wound base. There is large (67-100%) red granulation within the wound bed. There is a small (1-33%) amount of necrotic tissue within the wound bed including Eschar. The periwound skin appearance had no abnormalities noted for texture. The periwound skin appearance had no abnormalities noted for moisture. The periwound skin appearance exhibited: Hemosiderin Staining. Periwound temperature was noted as No Abnormality. Wound #2 status is Open. Original cause of wound was Gradually Appeared. The date acquired was: 01/13/2023. The wound has been in treatment 1 weeks. The wound is located on the Left,Lateral Lower Leg. The wound measures 1.1cm length x 1.4cm width x 0.1cm depth; 1.21cm^2 area and 0.121cm^3 volume. There is Fat Layer (Subcutaneous Tissue) exposed. There is no tunneling or undermining noted. There is a medium amount of serosanguineous drainage noted. The wound margin is distinct with the outline attached to the wound base. There is large (67-100%) red, hyper - granulation within the wound bed. There is a small (1-33%) amount of necrotic tissue within the wound bed including Eschar. The periwound skin appearance had no abnormalities noted for texture. The periwound skin appearance had no abnormalities noted for moisture. The periwound skin appearance exhibited: Hemosiderin Staining. Periwound temperature was noted as No Abnormality. Assessment Active Problems ICD-10 Non-pressure chronic ulcer of other part of right lower leg with fat layer exposed Non-pressure chronic ulcer of other part of left lower leg with fat layer exposed Chronic venous hypertension (idiopathic) with ulcer and inflammation of bilateral lower extremity Peripheral vascular disease, unspecified Procedures Wound #1 Pre-procedure diagnosis of Wound #1 is a Venous Leg Ulcer located on the Right,Lateral Lower Leg .Severity of Tissue Pre Debridement is: Fat layer exposed. There was a Excisional  Skin/Subcutaneous Tissue Debridement with a total area of 7.46 sq cm performed by Duanne Guess, MD. With the following instrument(s): Curette to remove Non-Viable tissue/material. Material removed includes Subcutaneous Tissue and Slough and after achieving pain control using Lidocaine 5% topical ointment. No specimens were taken. A time out was conducted at 10:40, prior to the start of the procedure. A Minimum amount of bleeding was controlled with Pressure. The procedure was tolerated well. Post Debridement Measurements: 3.8cm length x 2.5cm width x 0.1cm depth; 0.746cm^3 volume. Character of Wound/Ulcer Post Debridement is improved. Severity of Tissue Post Debridement is: Fat layer exposed. Post procedure Diagnosis Wound #1: Same as Pre-Procedure NAREK, KNISS ([id number]) [phone].pdf Page 9 of 11 Wound #2 Pre-procedure diagnosis of Wound #2 is a Venous Leg Ulcer located on the Left,Lateral Lower Leg .Severity of Tissue Pre Debridement is: Fat layer exposed. There was a Excisional Skin/Subcutaneous Tissue Debridement with a total area of 1.21 sq cm performed by Duanne Guess, MD. With the following instrument(s): Curette to remove Non-Viable tissue/material. Material removed includes Subcutaneous Tissue and Slough and after achieving pain control using Lidocaine 5% topical ointment. No specimens were taken. A time out was conducted at 10:40, prior to the start of the procedure. A Minimum amount of bleeding was controlled with Pressure. The procedure was tolerated well. Post Debridement Measurements: 1.1cm length x 1.4cm width x 0.1cm depth; 0.121cm^3 volume. Character  of Wound/Ulcer Post Debridement is improved. Severity of Tissue Post Debridement is: Fat layer exposed. Post procedure Diagnosis Wound #2: Same as Pre-Procedure Plan Follow-up Appointments: Return Appointment in 1 week. - Dr. Lady Gary - room 2 11/15 @ 8:15 am Anesthetic: (In clinic) Topical  Lidocaine 4% applied to wound bed Bathing/ Shower/ Hygiene: May shower with protection but do not get wound dressing(s) wet. Protect dressing(s) with water repellant cover (for example, large plastic bag) or a cast cover and may then take shower. Additional Orders / Instructions: Follow Nutritious Diet - vitamin C 500 mg start once a day and if tolerated then twice a day, zinc 30-50 mg once daily, protein shakes daily The following medication(s) was prescribed: lidocaine topical 5 % ointment ointment topical once daily was prescribed at facility WOUND #1: - Lower Leg Wound Laterality: Right, Lateral Cleanser: Soap and Water 1 x Per Week/30 Days Discharge Instructions: May shower and wash wound with dial antibacterial soap and water prior to dressing change. Cleanser: Wound Cleanser 1 x Per Week/30 Days Discharge Instructions: Cleanse the wound with wound cleanser prior to applying a clean dressing using gauze sponges, not tissue or cotton balls. Peri-Wound Care: Sween Lotion (Moisturizing lotion) 1 x Per Week/30 Days Discharge Instructions: Apply moisturizing lotion as directed Topical: Gentamicin 1 x Per Week/30 Days Discharge Instructions: As directed by physician Topical: Mupirocin Ointment 1 x Per Week/30 Days Discharge Instructions: Apply Mupirocin (Bactroban) as instructed Prim Dressing: Maxorb Extra Ag+ Alginate Dressing, 2x2 (in/in) 1 x Per Week/30 Days ary Discharge Instructions: Apply to wound bed as instructed Secondary Dressing: ABD Pad, 5x9 1 x Per Week/30 Days Discharge Instructions: Apply over primary dressing as directed. Com pression Wrap: Urgo K2 Lite, (equivalent to a 3 layer) two layer compression system, regular 1 x Per Week/30 Days Discharge Instructions: Apply Urgo K2 Lite as directed (alternative to 3 layer compression). Com pression Stockings: Circaid Juxta Lite Compression Wrap Compression Amount: 30-40 mmHg (right) Discharge Instructions: Apply Circaid Juxta Lite  Compression Wrap daily as instructed. Apply first thing in the morning, remove at night before bed. WOUND #2: - Lower Leg Wound Laterality: Left, Lateral Cleanser: Soap and Water 1 x Per Week/30 Days Discharge Instructions: May shower and wash wound with dial antibacterial soap and water prior to dressing change. Cleanser: Wound Cleanser 1 x Per Week/30 Days Discharge Instructions: Cleanse the wound with wound cleanser prior to applying a clean dressing using gauze sponges, not tissue or cotton balls. Peri-Wound Care: Sween Lotion (Moisturizing lotion) 1 x Per Week/30 Days Discharge Instructions: Apply moisturizing lotion as directed Topical: Gentamicin 1 x Per Week/30 Days Discharge Instructions: As directed by physician Topical: Mupirocin Ointment 1 x Per Week/30 Days Discharge Instructions: Apply Mupirocin (Bactroban) as instructed Prim Dressing: Maxorb Extra Ag+ Alginate Dressing, 2x2 (in/in) 1 x Per Week/30 Days ary Discharge Instructions: Apply to wound bed as instructed Secondary Dressing: ABD Pad, 5x9 1 x Per Week/30 Days Discharge Instructions: Apply over primary dressing as directed. Com pression Wrap: Urgo K2 Lite, (equivalent to a 3 layer) two layer compression system, regular 1 x Per Week/30 Days Discharge Instructions: Apply Urgo K2 Lite as directed (alternative to 3 layer compression). Com pression Stockings: Circaid Juxta Lite Compression Wrap Compression Amount: 30-40 mmHg (left) Discharge Instructions: Apply Circaid Juxta Lite Compression Wrap daily as instructed. Apply first thing in the morning, remove at night before bed. 02/05/2023: Both wounds are just slightly smaller. They are cleaner, however, with a bit of slough accumulation on the surfaces. Edema control  remains good. I used a curette to debride slough and subcutaneous tissue from both of his leg wounds. I am going to continue the silver alginate, but add a mixture of topical gentamicin and mupirocin to see if  suppression of normal skin flora helps with these wounds along quicker. We will continue bilateral Urgo lite compression. Follow-up in 1 week. Electronic Signature(s) Signed: 02/05/2023 10:49:10 AM By: Duanne Guess MD FACS Entered By: Duanne Guess on 02/05/2023 07:49:10 [name] ([id number]) [id number] Page 10 of 11 -------------------------------------------------------------------------------- HxROS Details Patient Name: Date of Service: [name] 02/05/2023 10:15 A M Medical Record Number: [id number] Patient Account Number: [id number] Date of Birth/Sex: Treating RN: [birth year] (67 y.o. M) Primary Care Provider: Lynnea Ferrier Other Clinician: Referring Provider: Treating Provider/Extender: [name] in Treatment: 1 Information Obtained From Patient Chart Respiratory Medical History: Positive for: Chronic Obstructive Pulmonary Disease (COPD) Cardiovascular Medical History: Positive for: Angina; Arrhythmia - a-fib; Coronary Artery Disease; Hypertension; Myocardial Infarction; Peripheral Venous Disease Past Medical History Notes: rheumatic heart disease, Hx of artificial heart valve replacement, Mitral insufficiency and aortic stenosis Gastrointestinal Medical History: Past Medical History Notes: hiatal hernia Neurologic Medical History: Past Medical History Notes: stroke Oncologic Medical History: Past Medical History Notes: skin cancer Immunizations Pneumococcal Vaccine: Received Pneumococcal Vaccination: Yes Received Pneumococcal Vaccination On or After 60th Birthday: Yes Implantable Devices None Hospitalization / Surgery History Type of Hospitalization/Surgery Incision and drainage of wound (Right) Application of wound vac Femoral artery exploration (Right) Lower extremity angiogram (Right) Femoral-popliteal bypass graft (Right) Abdominal aortogram w/lower extremity  (Bilateral) Colonoscopy with propofol Polypectomy Laparoscopic appendectomy Coronary angioplasty with stent placement Aortic and mitral valve replacement Appendectomy Cardiac valve replacement Family and Social History Cancer: Yes - Mother,Child; Diabetes: Yes - Maternal Grandparents; Heart Disease: Yes - Father; Hereditary Spherocytosis: No; Hypertension: No; Kidney Disease: No; Lung Disease: Yes - Mother; Seizures: No; Stroke: Yes - Father; Thyroid Problems: No; Tuberculosis: No; Current every day smoker - 1/2 ppd; Marital Status - Married; Alcohol Use: Rarely; Drug Use: No History; Caffeine Use: Moderate; Financial Concerns: No; Food, Clothing or Shelter Needs: NoHAL, [place] ([id number]) [id number] Page 11 of 11 Support System Lacking: No; Transportation Concerns: No Electronic Signature(s) Signed: 02/05/2023 12:00:21 PM By: Duanne Guess MD FACS Entered By: Duanne Guess on 02/05/2023 07:46:38 -------------------------------------------------------------------------------- SuperBill Details Patient Name: Date of Service: [name] 02/05/2023 Medical Record Number: [id number] Patient Account Number: [id number] Date of Birth/Sex: Treating RN: 1956-02-23 (67 y.o. M) Primary Care Provider: Lynnea Ferrier Other Clinician: Referring Provider: Treating Provider/Extender: [name] in Treatment: 1 Diagnosis Coding ICD-10 Codes Code Description [phone] Non-pressure chronic ulcer of other part of right lower leg with fat layer exposed L97.822 Non-pressure chronic ulcer of other part of left lower leg with fat layer exposed I87.333 Chronic venous hypertension (idiopathic) with ulcer and inflammation of bilateral lower extremity I73.9 Peripheral vascular disease, unspecified Facility Procedures : CPT4 Code: [id number] Description: 11042 - DEB SUBQ TISSUE 20 SQ CM/< ICD-10 Diagnosis Description L97.812 Non-pressure  chronic ulcer of other part of right lower leg with fat layer exp L97.822 Non-pressure chronic ulcer of other part of left lower leg with fat layer expo Modifier: osed sed Quantity: 1 Physician Procedures : CPT4 Code Description Modifier [id number] - WC PHYS LEVEL 4 - EST PT ICD-10 Diagnosis Description L97.812 Non-pressure chronic ulcer of other part of right lower leg with fat layer exposed L97.822 Non-pressure chronic ulcer of other part of left  lower  leg with fat layer exposed I87.333 Chronic venous hypertension (idiopathic) with ulcer and inflammation of bilateral lower extremity I73.9 Peripheral vascular disease, unspecified Quantity: 1 : [id number] 11042 - WC PHYS SUBQ TISS 20 SQ CM ICD-10 Diagnosis Description L97.812 Non-pressure chronic ulcer of other part of right lower leg with fat layer exposed L97.822 Non-pressure chronic ulcer of other part of left lower leg with fat layer exposed Quantity: 1 Electronic Signature(s) Signed: 02/05/2023 10:50:13 AM By: Duanne Guess MD FACS Entered By: Duanne Guess on 02/05/2023 07:50:13

## 2023-02-14 ENCOUNTER — Encounter (HOSPITAL_BASED_OUTPATIENT_CLINIC_OR_DEPARTMENT_OTHER): Payer: Medicare Other | Admitting: General Surgery

## 2023-02-14 DIAGNOSIS — I87333 Chronic venous hypertension (idiopathic) with ulcer and inflammation of bilateral lower extremity: Secondary | ICD-10-CM | POA: Diagnosis not present

## 2023-02-14 NOTE — Progress Notes (Signed)
RUGER, OBERLANDER ([id number]) [id number] Page 1 of 12 Visit Report for 02/14/2023 Chief Complaint Document Details Patient Name: Date of Service: [name] 02/14/2023 8:15 A M Medical Record Number: [id number] Patient Account Number: [id number] Date of Birth/Sex: Treating RN: 09-15-1955 (67 y.o. M) Primary Care Provider: Lynnea Ferrier Other Clinician: Referring Provider: Treating Provider/Extender: [name] in Treatment: 3 Information Obtained from: Patient Chief Complaint Patient presents for treatment of open ulcers due to venous insufficiency Electronic Signature(s) Signed: 02/14/2023 9:18:54 AM By: Duanne Guess MD FACS Entered By: Duanne Guess on 02/14/2023 09:18:54 -------------------------------------------------------------------------------- Debridement Details Patient Name: Date of Service: [name], [name] 02/14/2023 8:15 A M Medical Record Number: [id number] Patient Account Number: [id number] Date of Birth/Sex: Treating RN: Jun 25, 1955 (67 y.o. [name] Primary Care Provider: Lynnea Ferrier Other Clinician: Referring Provider: Treating Provider/Extender: [name] in Treatment: 3 Debridement Performed for Assessment: Wound #2 Left,Lateral Lower Leg Performed By: Physician Duanne Guess, MD The following information was scribed by: Samuella Bruin The information was scribed for: Duanne Guess Debridement Type: Debridement Severity of Tissue Pre Debridement: Fat layer exposed Level of Consciousness (Pre-procedure): Awake and Alert Pre-procedure Verification/Time Out Yes - 08:45 Taken: Start Time: 08:45 Pain Control: Lidocaine 4% T opical Solution Percent of Wound Bed Debrided: 100% T Area Debrided (cm): otal 0.94 Tissue and other material debrided: Non-Viable, Slough, Subcutaneous, Slough Level: Skin/Subcutaneous Tissue Debridement  Description: Excisional Instrument: Curette Bleeding: Minimum Hemostasis Achieved: Pressure Response to Treatment: Procedure was tolerated well Level of Consciousness (Post- Awake and Alert procedure): Post Debridement Measurements of Total Wound Length: (cm) 1 Width: (cm) 1.2 Depth: (cm) 0.1 Volume: (cm) 0.094 Character of Wound/Ulcer Post Debridement: Improved Severity of Tissue Post Debridement: Fat layer exposed [name] ([id number]) [id number] Page 2 of 12 Post Procedure Diagnosis Same as Pre-procedure Electronic Signature(s) Signed: 02/14/2023 11:57:20 AM By: Samuella Bruin Signed: 02/14/2023 12:51:55 PM By: Duanne Guess MD FACS Entered By: Samuella Bruin on 02/14/2023 08:45:45 -------------------------------------------------------------------------------- Debridement Details Patient Name: Date of Service: [name], [name] 02/14/2023 8:15 A M Medical Record Number: [id number] Patient Account Number: [id number] Date of Birth/Sex: Treating RN: 1955/10/20 (67 y.o. [name] Primary Care Provider: Lynnea Ferrier Other Clinician: Referring Provider: Treating Provider/Extender: [name] in Treatment: 3 Debridement Performed for Assessment: Wound #1 Right,Lateral Lower Leg Performed By: Physician Duanne Guess, MD The following information was scribed by: Samuella Bruin The information was scribed for: Duanne Guess Debridement Type: Debridement Severity of Tissue Pre Debridement: Fat layer exposed Level of Consciousness (Pre-procedure): Awake and Alert Pre-procedure Verification/Time Out Yes - 08:45 Taken: Start Time: 08:45 Pain Control: Lidocaine 4% T opical Solution Percent of Wound Bed Debrided: 100% T Area Debrided (cm): otal 5.01 Tissue and other material debrided: Non-Viable, Slough, Subcutaneous, Slough Level: Skin/Subcutaneous Tissue Debridement Description:  Excisional Instrument: Curette Bleeding: Minimum Hemostasis Achieved: Pressure Response to Treatment: Procedure was tolerated well Level of Consciousness (Post- Awake and Alert procedure): Post Debridement Measurements of Total Wound Length: (cm) 2.9 Width: (cm) 2.2 Depth: (cm) 0.1 Volume: (cm) 0.501 Character of Wound/Ulcer Post Debridement: Improved Severity of Tissue Post Debridement: Fat layer exposed Post Procedure Diagnosis Same as Pre-procedure Electronic Signature(s) Signed: 02/14/2023 11:57:20 AM By: Samuella Bruin Signed: 02/14/2023 12:51:55 PM By: Duanne Guess MD FACS Entered By: Samuella Bruin on 02/14/2023 08:46:08 HPI Details -------------------------------------------------------------------------------- [name] ([id number]) [id number] Page 3 of 12 Patient Name: Date of Service: [name], [name] 02/14/2023 8:15 A M  Medical Record Number: [id number] Patient Account Number: [id number] Date of Birth/Sex: Treating RN: 01/30/1956 (67 y.o. M) Primary Care Provider: Lynnea Ferrier Other Clinician: Referring Provider: Treating Provider/Extender: [name] in Treatment: 3 History of Present Illness HPI Description: ADMISSION 01/23/2023 ***VASCULAR STUDIES:*** ABIs 11/2021: +-------+-----------+-----------+------------+------------+ ABI/TBIT oday's ABIT oday's TBIPrevious ABIPrevious TBI +-------+-----------+-----------+------------+------------+ Right 1.08 0.35 0.82 0.29  +-------+-----------+-----------+------------+------------+ Left 0.97 0.27 0.88 0.27  +-------+-----------+-----------+------------+------------+ Venous reflux 03/2021 Venous Reflux Times +--------------+---------+------+-----------+------------+--------+ RIGHT Reflux NoRefluxReflux TimeDiameter cmsComments    Yes      +--------------+---------+------+-----------+------------+--------+ CFV   yes  >1 second    +--------------+---------+------+-----------+------------+--------+ FV prox   yes  >1 second    +--------------+---------+------+-----------+------------+--------+ FV mid   yes  >1 second    +--------------+---------+------+-----------+------------+--------+ FV dist   yes  >1 second    +--------------+---------+------+-----------+------------+--------+ Popliteal   yes  >1 second    +--------------+---------+------+-----------+------------+--------+ GSV at SFJ   yes  >500 ms  0.736   +--------------+---------+------+-----------+------------+--------+ GSV prox thigh  yes  >500 ms  0.591   +--------------+---------+------+-----------+------------+--------+ GSV mid thigh   yes  >500 ms  0.591   +--------------+---------+------+-----------+------------+--------+ GSV dist thigh  yes  >500 ms  0.546   +--------------+---------+------+-----------+------------+--------+ GSV at knee no    0.592   +--------------+---------+------+-----------+------------+--------+ GSV prox calf     0.441   +--------------+---------+------+-----------+------------+--------+ GSV mid calf     0.461   +--------------+---------+------+-----------+------------+--------+ SSV Pop Fossa no    0.406   +--------------+---------+------+-----------+------------+--------+ SSV prox calf   yes  >500 ms  0.379   +--------------+---------+------+-----------+------------+--------+ SSV mid calf     0.379   +--------------+---------+------+-----------+------------+--------+ Summary: Right: - No evidence of deep vein thrombosis seen in the right lower extremity, from the common femoral through the popliteal veins. - No evidence of superficial venous thrombosis in the right lower extremity. - Venous reflux is noted in the right  common femoral vein. - Venous reflux is noted in the right sapheno-femoral junction. - Venous reflux is noted in the right greater saphenous vein in the thigh. - Venous reflux is noted in the right femoral vein. - Venous reflux is noted in the right popliteal vein. - Venous reflux is noted in the right short saphenous vein. This is a 67 year old smoker with a significant history of cardiac and peripheral vascular disease. He has had issues with venous ulceration on his right leg that have resolved with local care in the past, but the most recent wound has been present for a couple of months and has not healed despite treatment with Silvadene cream and home application of Unna boots. He also has developed 2 small ulcers on the left that have presented within the past couple of weeks. Of note, his right greater saphenous vein was harvested in 2023 for femoral above-knee popliteal bypass. He was referred here by his primary care provider for further evaluation and management of his lower extremity venous ulcers. 01/31/2023: The left medial lower leg wound is healed. The left lateral and right lateral lower leg wounds are both smaller. They have slough accumulation on their surfaces. Edema control is excellent. 02/05/2023: Both wounds are just slightly smaller. They are cleaner, however, with a bit of slough accumulation on the surfaces. Edema control remains good. 02/14/2023: The wounds are smaller again today. They have slough on the surface. Edema control is good. SHAKEIL, MUSTIAN ([id number]) [id number] Page 4 of 12 Electronic Signature(s) Signed: 02/14/2023 9:19:24 AM By: Duanne Guess MD FACS Entered By: Duanne Guess on 02/14/2023 09:19:24 -------------------------------------------------------------------------------- Physical Exam Details Patient Name: Date  of Service: [name] 02/14/2023 8:15 A M Medical Record Number: [id number] Patient Account  Number: [id number] Date of Birth/Sex: Treating RN: 1955/05/30 (67 y.o. M) Primary Care Provider: Lynnea Ferrier Other Clinician: Referring Provider: Treating Provider/Extender: [name] in Treatment: 3 Constitutional Slightly hypertensive. . . . no acute distress. Respiratory Normal work of breathing on room air.. Notes 02/14/2023: The wounds are smaller again today. They have slough on the surface. Edema control is good. Electronic Signature(s) Signed: 02/14/2023 9:19:49 AM By: Duanne Guess MD FACS Entered By: Duanne Guess on 02/14/2023 09:19:49 -------------------------------------------------------------------------------- Physician Orders Details Patient Name: Date of Service: [name], [name] 02/14/2023 8:15 A M Medical Record Number: [id number] Patient Account Number: [id number] Date of Birth/Sex: Treating RN: [birth year] (66 y.o. [name] Primary Care Provider: Lynnea Ferrier Other Clinician: Referring Provider: Treating Provider/Extender: [name] in Treatment: 3 The following information was scribed by: Samuella Bruin The information was scribed for: Duanne Guess Verbal / Phone Orders: No Diagnosis Coding ICD-10 Coding Code Description [phone] Non-pressure chronic ulcer of other part of right lower leg with fat layer exposed L97.822 Non-pressure chronic ulcer of other part of left lower leg with fat layer exposed I87.333 Chronic venous hypertension (idiopathic) with ulcer and inflammation of bilateral lower extremity I73.9 Peripheral vascular disease, unspecified Follow-up Appointments ppointment in 1 week. - Dr. Lady Gary - room 2 Return A Anesthetic (In clinic) Topical Lidocaine 4% applied to wound bed Bathing/ Shower/ Hygiene May shower with protection but do not get wound dressing(s) wet. Protect dressing(s) with water repellant cover (for example, large plastic bag) or a  cast cover and may then take shower. JOLEN, BARDI ([id number]) [id number] Page 5 of 12 Additional Orders / Instructions Follow Nutritious Diet - vitamin C 500 mg start once a day and if tolerated then twice a day, zinc 30-50 mg once daily, protein shakes daily Wound Treatment Wound #1 - Lower Leg Wound Laterality: Right, Lateral Cleanser: Soap and Water 1 x Per Week/30 Days Discharge Instructions: May shower and wash wound with dial antibacterial soap and water prior to dressing change. Cleanser: Wound Cleanser 1 x Per Week/30 Days Discharge Instructions: Cleanse the wound with wound cleanser prior to applying a clean dressing using gauze sponges, not tissue or cotton balls. Peri-Wound Care: Sween Lotion (Moisturizing lotion) 1 x Per Week/30 Days Discharge Instructions: Apply moisturizing lotion as directed Topical: Gentamicin 1 x Per Week/30 Days Discharge Instructions: As directed by physician Topical: Mupirocin Ointment 1 x Per Week/30 Days Discharge Instructions: Apply Mupirocin (Bactroban) as instructed Prim Dressing: Maxorb Extra Ag+ Alginate Dressing, 2x2 (in/in) 1 x Per Week/30 Days ary Discharge Instructions: Apply to wound bed as instructed Secondary Dressing: ABD Pad, 5x9 1 x Per Week/30 Days Discharge Instructions: Apply over primary dressing as directed. Compression Wrap: Urgo K2 Lite, (equivalent to a 3 layer) two layer compression system, regular 1 x Per Week/30 Days Discharge Instructions: Apply Urgo K2 Lite as directed (alternative to 3 layer compression). Compression Stockings: Circaid Juxta Lite Compression Wrap Right Leg Compression Amount: 30-40 mmHG Discharge Instructions: Apply Circaid Juxta Lite Compression Wrap daily as instructed. Apply first thing in the morning, remove at night before bed. Wound #2 - Lower Leg Wound Laterality: Left, Lateral Cleanser: Soap and Water 1 x Per Week/30 Days Discharge Instructions: May shower and wash  wound with dial antibacterial soap and water prior to dressing change. Cleanser: Wound Cleanser 1 x Per Week/30 Days Discharge Instructions: Cleanse the wound  with wound cleanser prior to applying a clean dressing using gauze sponges, not tissue or cotton balls. Peri-Wound Care: Sween Lotion (Moisturizing lotion) 1 x Per Week/30 Days Discharge Instructions: Apply moisturizing lotion as directed Topical: Gentamicin 1 x Per Week/30 Days Discharge Instructions: As directed by physician Topical: Mupirocin Ointment 1 x Per Week/30 Days Discharge Instructions: Apply Mupirocin (Bactroban) as instructed Prim Dressing: Maxorb Extra Ag+ Alginate Dressing, 2x2 (in/in) 1 x Per Week/30 Days ary Discharge Instructions: Apply to wound bed as instructed Secondary Dressing: ABD Pad, 5x9 1 x Per Week/30 Days Discharge Instructions: Apply over primary dressing as directed. Compression Wrap: Urgo K2 Lite, (equivalent to a 3 layer) two layer compression system, regular 1 x Per Week/30 Days Discharge Instructions: Apply Urgo K2 Lite as directed (alternative to 3 layer compression). Compression Stockings: Circaid Juxta Lite Compression Wrap Left Leg Compression Amount: 30-40 mmHG Discharge Instructions: Apply Circaid Juxta Lite Compression Wrap daily as instructed. Apply first thing in the morning, remove at night before bed. Patient Medications llergies: No Known Allergies A Notifications Medication Indication Start End 02/14/2023 lidocaine DOSE topical 4 % cream - cream topical Electronic Signature(s) Signed: 02/14/2023 12:51:55 PM By: Duanne Guess MD FACS [place], Signed: 02/14/2023 12:51:55 PM By: Duanne Guess MD Lona Kettle ([id number]) [id number] Page 6 of 12 Entered By: Duanne Guess on 02/14/2023 09:20:05 -------------------------------------------------------------------------------- Problem List Details Patient Name: Date of Service: [name]  02/14/2023 8:15 A M Medical Record Number: [id number] Patient Account Number: [id number] Date of Birth/Sex: Treating RN: 1955/07/07 (67 y.o. M) Primary Care Provider: Lynnea Ferrier Other Clinician: Referring Provider: Treating Provider/Extender: [name] in Treatment: 3 Active Problems ICD-10 Encounter Code Description Active Date MDM Diagnosis L97.812 Non-pressure chronic ulcer of other part of right lower leg with fat layer 01/23/2023 No Yes exposed L97.822 Non-pressure chronic ulcer of other part of left lower leg with fat layer 01/23/2023 No Yes exposed I87.333 Chronic venous hypertension (idiopathic) with ulcer and inflammation of 01/23/2023 No Yes bilateral lower extremity I73.9 Peripheral vascular disease, unspecified 01/23/2023 No Yes Inactive Problems Resolved Problems Electronic Signature(s) Signed: 02/14/2023 9:18:27 AM By: Duanne Guess MD FACS Entered By: Duanne Guess on 02/14/2023 09:18:27 -------------------------------------------------------------------------------- Progress Note Details Patient Name: Date of Service: [name], [name] 02/14/2023 8:15 A M Medical Record Number: [id number] Patient Account Number: [id number] Date of Birth/Sex: Treating RN: 1955/11/06 (67 y.o. M) Primary Care Provider: Lynnea Ferrier Other Clinician: Referring Provider: Treating Provider/Extender: [name] in Treatment: 3 Subjective Chief Complaint Information obtained from Patient Patient presents for treatment of open ulcers due to venous insufficiency OSSIAN, LIEBOLD ([id number]) [id number] Page 7 of 12 History of Present Illness (HPI) ADMISSION 01/23/2023 ***VASCULAR STUDIES:*** ABIs 11/2021: +-------+-----------+-----------+------------+------------+ ABI/TBIT oday's ABIT oday's TBIPrevious ABIPrevious  TBI +-------+-----------+-----------+------------+------------+ Right 1.08 0.35 0.82 0.29  +-------+-----------+-----------+------------+------------+ Left 0.97 0.27 0.88 0.27  +-------+-----------+-----------+------------+------------+ Venous reflux 03/2021 Venous Reflux Times +--------------+---------+------+-----------+------------+--------+ RIGHT Reflux NoRefluxReflux TimeDiameter cmsComments    Yes     +--------------+---------+------+-----------+------------+--------+ CFV   yes  >1 second    +--------------+---------+------+-----------+------------+--------+ FV prox   yes  >1 second    +--------------+---------+------+-----------+------------+--------+ FV mid   yes  >1 second    +--------------+---------+------+-----------+------------+--------+ FV dist   yes  >1 second    +--------------+---------+------+-----------+------------+--------+ Popliteal   yes  >1 second    +--------------+---------+------+-----------+------------+--------+ GSV at SFJ   yes  >500 ms  0.736   +--------------+---------+------+-----------+------------+--------+ GSV prox thigh  yes  >500 ms  0.591   +--------------+---------+------+-----------+------------+--------+ GSV mid thigh  yes  >500 ms  0.591   +--------------+---------+------+-----------+------------+--------+ GSV dist thigh  yes  >500 ms  0.546   +--------------+---------+------+-----------+------------+--------+ GSV at knee no    0.592   +--------------+---------+------+-----------+------------+--------+ GSV prox calf     0.441   +--------------+---------+------+-----------+------------+--------+ GSV mid calf     0.461   +--------------+---------+------+-----------+------------+--------+ SSV Pop Fossa no    0.406   +--------------+---------+------+-----------+------------+--------+ SSV prox calf   yes  >500 ms   0.379   +--------------+---------+------+-----------+------------+--------+ SSV mid calf     0.379   +--------------+---------+------+-----------+------------+--------+ Summary: Right: - No evidence of deep vein thrombosis seen in the right lower extremity, from the common femoral through the popliteal veins. - No evidence of superficial venous thrombosis in the right lower extremity. - Venous reflux is noted in the right common femoral vein. - Venous reflux is noted in the right sapheno-femoral junction. - Venous reflux is noted in the right greater saphenous vein in the thigh. - Venous reflux is noted in the right femoral vein. - Venous reflux is noted in the right popliteal vein. - Venous reflux is noted in the right short saphenous vein. This is a 67 year old smoker with a significant history of cardiac and peripheral vascular disease. He has had issues with venous ulceration on his right leg that have resolved with local care in the past, but the most recent wound has been present for a couple of months and has not healed despite treatment with Silvadene cream and home application of Unna boots. He also has developed 2 small ulcers on the left that have presented within the past couple of weeks. Of note, his right greater saphenous vein was harvested in 2023 for femoral above-knee popliteal bypass. He was referred here by his primary care provider for further evaluation and management of his lower extremity venous ulcers. 01/31/2023: The left medial lower leg wound is healed. The left lateral and right lateral lower leg wounds are both smaller. They have slough accumulation on their surfaces. Edema control is excellent. 02/05/2023: Both wounds are just slightly smaller. They are cleaner, however, with a bit of slough accumulation on the surfaces. Edema control remains good. 02/14/2023: The wounds are smaller again today. They have slough on the surface. Edema control is  good. Patient History Information obtained from Patient, Chart. Family History Cancer - Mother,Child, Diabetes - Maternal Grandparents, Heart Disease - Father, Lung Disease - Mother, Stroke - Father, No family history of Hereditary Spherocytosis, Hypertension, Kidney Disease, Seizures, Thyroid Problems, Tuberculosis. Social History Current every day smoker - 1/2 ppd, Marital Status - Married, Alcohol Use - Rarely, Drug Use - No History, Caffeine Use - Moderate. BACILIO, RHYNER ([id number]) [id number] Page 8 of 12 Medical History Respiratory Patient has history of Chronic Obstructive Pulmonary Disease (COPD) Cardiovascular Patient has history of Angina, Arrhythmia - a-fib, Coronary Artery Disease, Hypertension, Myocardial Infarction, Peripheral Venous Disease Hospitalization/Surgery History - Incision and drainage of wound (Right). - Application of wound vac. - Femoral artery exploration (Right). - Lower extremity angiogram (Right). - Femoral-popliteal bypass graft (Right). - Abdominal aortogram w/lower extremity (Bilateral). - Colonoscopy with propofol. - Polypectomy. - Laparoscopic appendectomy. - Coronary angioplasty with stent placement. - Aortic and mitral valve replacement. - Appendectomy. - Cardiac valve replacement. Medical A Surgical History Notes nd Cardiovascular rheumatic heart disease, Hx of artificial heart valve replacement, Mitral insufficiency and aortic stenosis Gastrointestinal hiatal hernia Neurologic stroke Oncologic skin cancer Objective Constitutional Slightly hypertensive. no acute distress. Vitals Time Taken: 8:20 AM, Height: 68 in, Weight: 208 lbs, BMI: 31.6,  Temperature: 97.3 F, Pulse: 78 bpm, Respiratory Rate: 20 breaths/min, Blood Pressure: 146/74 mmHg. Respiratory Normal work of breathing on room air.. General Notes: 02/14/2023: The wounds are smaller again today. They have slough on the surface. Edema control is  good. Integumentary (Hair, Skin) Wound #1 status is Open. Original cause of wound was Gradually Appeared. The date acquired was: 10/31/2022. The wound has been in treatment 3 weeks. The wound is located on the Right,Lateral Lower Leg. The wound measures 2.9cm length x 2.2cm width x 0.1cm depth; 5.011cm^2 area and 0.501cm^3 volume. There is Fat Layer (Subcutaneous Tissue) exposed. There is no tunneling or undermining noted. There is a medium amount of serosanguineous drainage noted. The wound margin is distinct with the outline attached to the wound base. There is large (67-100%) red, hyper - granulation within the wound bed. There is a small (1-33%) amount of necrotic tissue within the wound bed including Adherent Slough. The periwound skin appearance had no abnormalities noted for texture. The periwound skin appearance had no abnormalities noted for moisture. The periwound skin appearance exhibited: Hemosiderin Staining. Periwound temperature was noted as No Abnormality. Wound #2 status is Open. Original cause of wound was Gradually Appeared. The date acquired was: 01/13/2023. The wound has been in treatment 3 weeks. The wound is located on the Left,Lateral Lower Leg. The wound measures 1cm length x 1.2cm width x 0.1cm depth; 0.942cm^2 area and 0.094cm^3 volume. There is Fat Layer (Subcutaneous Tissue) exposed. There is no tunneling or undermining noted. There is a medium amount of serosanguineous drainage noted. The wound margin is distinct with the outline attached to the wound base. There is medium (34-66%) red granulation within the wound bed. There is a medium (34-66%) amount of necrotic tissue within the wound bed. The periwound skin appearance had no abnormalities noted for texture. The periwound skin appearance had no abnormalities noted for moisture. The periwound skin appearance exhibited: Hemosiderin Staining. Periwound temperature was noted as No Abnormality. Assessment Active  Problems ICD-10 Non-pressure chronic ulcer of other part of right lower leg with fat layer exposed Non-pressure chronic ulcer of other part of left lower leg with fat layer exposed Chronic venous hypertension (idiopathic) with ulcer and inflammation of bilateral lower extremity Peripheral vascular disease, unspecified Procedures Wound #1 Pre-procedure diagnosis of Wound #1 is a Venous Leg Ulcer located on the Right,Lateral Lower Leg .Severity of Tissue Pre Debridement is: Fat layer exposed. There was a Excisional Skin/Subcutaneous Tissue Debridement with a total area of 5.01 sq cm performed by Duanne Guess, MD. With the following instrument(s): Curette to remove Non-Viable tissue/material. Material removed includes Subcutaneous Tissue and Slough and after achieving pain control using Lidocaine 4% T opical Solution. No specimens were taken. A time out was conducted at 08:45, prior to the start of the procedure. A Minimum amount of PHILLIPPE, RAYMO ([id number]) [id number] Page 9 of 12 bleeding was controlled with Pressure. The procedure was tolerated well. Post Debridement Measurements: 2.9cm length x 2.2cm width x 0.1cm depth; 0.501cm^3 volume. Character of Wound/Ulcer Post Debridement is improved. Severity of Tissue Post Debridement is: Fat layer exposed. Post procedure Diagnosis Wound #1: Same as Pre-Procedure Pre-procedure diagnosis of Wound #1 is a Venous Leg Ulcer located on the Right,Lateral Lower Leg . There was a Double Layer Compression Therapy Procedure by Samuella Bruin, RN. Post procedure Diagnosis Wound #1: Same as Pre-Procedure Wound #2 Pre-procedure diagnosis of Wound #2 is a Venous Leg Ulcer located on the Left,Lateral Lower Leg .Severity of Tissue Pre Debridement is: Fat layer  exposed. There was a Excisional Skin/Subcutaneous Tissue Debridement with a total area of 0.94 sq cm performed by Duanne Guess, MD. With the following instrument(s):  Curette to remove Non-Viable tissue/material. Material removed includes Subcutaneous Tissue and Slough and after achieving pain control using Lidocaine 4% T opical Solution. No specimens were taken. A time out was conducted at 08:45, prior to the start of the procedure. A Minimum amount of bleeding was controlled with Pressure. The procedure was tolerated well. Post Debridement Measurements: 1cm length x 1.2cm width x 0.1cm depth; 0.094cm^3 volume. Character of Wound/Ulcer Post Debridement is improved. Severity of Tissue Post Debridement is: Fat layer exposed. Post procedure Diagnosis Wound #2: Same as Pre-Procedure Pre-procedure diagnosis of Wound #2 is a Venous Leg Ulcer located on the Left,Lateral Lower Leg . There was a Double Layer Compression Therapy Procedure by Samuella Bruin, RN. Post procedure Diagnosis Wound #2: Same as Pre-Procedure Plan Follow-up Appointments: Return Appointment in 1 week. - Dr. Lady Gary - room 2 Anesthetic: (In clinic) Topical Lidocaine 4% applied to wound bed Bathing/ Shower/ Hygiene: May shower with protection but do not get wound dressing(s) wet. Protect dressing(s) with water repellant cover (for example, large plastic bag) or a cast cover and may then take shower. Additional Orders / Instructions: Follow Nutritious Diet - vitamin C 500 mg start once a day and if tolerated then twice a day, zinc 30-50 mg once daily, protein shakes daily The following medication(s) was prescribed: lidocaine topical 4 % cream cream topical was prescribed at facility WOUND #1: - Lower Leg Wound Laterality: Right, Lateral Cleanser: Soap and Water 1 x Per Week/30 Days Discharge Instructions: May shower and wash wound with dial antibacterial soap and water prior to dressing change. Cleanser: Wound Cleanser 1 x Per Week/30 Days Discharge Instructions: Cleanse the wound with wound cleanser prior to applying a clean dressing using gauze sponges, not tissue or cotton  balls. Peri-Wound Care: Sween Lotion (Moisturizing lotion) 1 x Per Week/30 Days Discharge Instructions: Apply moisturizing lotion as directed Topical: Gentamicin 1 x Per Week/30 Days Discharge Instructions: As directed by physician Topical: Mupirocin Ointment 1 x Per Week/30 Days Discharge Instructions: Apply Mupirocin (Bactroban) as instructed Prim Dressing: Maxorb Extra Ag+ Alginate Dressing, 2x2 (in/in) 1 x Per Week/30 Days ary Discharge Instructions: Apply to wound bed as instructed Secondary Dressing: ABD Pad, 5x9 1 x Per Week/30 Days Discharge Instructions: Apply over primary dressing as directed. Com pression Wrap: Urgo K2 Lite, (equivalent to a 3 layer) two layer compression system, regular 1 x Per Week/30 Days Discharge Instructions: Apply Urgo K2 Lite as directed (alternative to 3 layer compression). Com pression Stockings: Circaid Juxta Lite Compression Wrap Compression Amount: 30-40 mmHg (right) Discharge Instructions: Apply Circaid Juxta Lite Compression Wrap daily as instructed. Apply first thing in the morning, remove at night before bed. WOUND #2: - Lower Leg Wound Laterality: Left, Lateral Cleanser: Soap and Water 1 x Per Week/30 Days Discharge Instructions: May shower and wash wound with dial antibacterial soap and water prior to dressing change. Cleanser: Wound Cleanser 1 x Per Week/30 Days Discharge Instructions: Cleanse the wound with wound cleanser prior to applying a clean dressing using gauze sponges, not tissue or cotton balls. Peri-Wound Care: Sween Lotion (Moisturizing lotion) 1 x Per Week/30 Days Discharge Instructions: Apply moisturizing lotion as directed Topical: Gentamicin 1 x Per Week/30 Days Discharge Instructions: As directed by physician Topical: Mupirocin Ointment 1 x Per Week/30 Days Discharge Instructions: Apply Mupirocin (Bactroban) as instructed Prim Dressing: Maxorb Extra Ag+ Alginate Dressing,  2x2 (in/in) 1 x Per Week/30 Days ary Discharge  Instructions: Apply to wound bed as instructed Secondary Dressing: ABD Pad, 5x9 1 x Per Week/30 Days Discharge Instructions: Apply over primary dressing as directed. Com pression Wrap: Urgo K2 Lite, (equivalent to a 3 layer) two layer compression system, regular 1 x Per Week/30 Days Discharge Instructions: Apply Urgo K2 Lite as directed (alternative to 3 layer compression). Com pression Stockings: Circaid Juxta Lite Compression Wrap Compression Amount: 30-40 mmHg (left) Discharge Instructions: Apply Circaid Juxta Lite Compression Wrap daily as instructed. Apply first thing in the morning, remove at night before bed. 02/14/2023: The wounds are smaller again today. They have slough on the surface. Edema control is good. KEIDRICK, PLACK ([id number]) [id number] Page 10 of 12 I used a curette to debride slough and subcutaneous tissue from both of his wounds. We will continue the mixture of topical gentamicin and mupirocin with silver alginate and bilateral Urgo lite compression wraps. Follow-up in 1 week. Electronic Signature(s) Signed: 02/14/2023 9:20:49 AM By: Duanne Guess MD FACS Entered By: Duanne Guess on 02/14/2023 09:20:49 -------------------------------------------------------------------------------- HxROS Details Patient Name: Date of Service: [name], [name] 02/14/2023 8:15 A M Medical Record Number: [id number] Patient Account Number: [id number] Date of Birth/Sex: Treating RN: 05-13-1955 (67 y.o. M) Primary Care Provider: Lynnea Ferrier Other Clinician: Referring Provider: Treating Provider/Extender: [name] in Treatment: 3 Information Obtained From Patient Chart Respiratory Medical History: Positive for: Chronic Obstructive Pulmonary Disease (COPD) Cardiovascular Medical History: Positive for: Angina; Arrhythmia - a-fib; Coronary Artery Disease; Hypertension; Myocardial Infarction; Peripheral Venous  Disease Past Medical History Notes: rheumatic heart disease, Hx of artificial heart valve replacement, Mitral insufficiency and aortic stenosis Gastrointestinal Medical History: Past Medical History Notes: hiatal hernia Neurologic Medical History: Past Medical History Notes: stroke Oncologic Medical History: Past Medical History Notes: skin cancer Immunizations Pneumococcal Vaccine: Received Pneumococcal Vaccination: Yes Received Pneumococcal Vaccination On or After 60th Birthday: Yes Implantable Devices None Hospitalization / Surgery History Type of Hospitalization/Surgery Incision and drainage of wound (Right) Application of wound vac Femoral artery exploration (Right) Lower extremity angiogram (Right) Femoral-popliteal bypass graft (Right) [facility], [facility]ON[facility] ([id number]) [id number] Page 11 of 12 Abdominal aortogram w/lower extremity (Bilateral) Colonoscopy with propofol Polypectomy Laparoscopic appendectomy Coronary angioplasty with stent placement Aortic and mitral valve replacement Appendectomy Cardiac valve replacement Family and Social History Cancer: Yes - Mother,Child; Diabetes: Yes - Maternal Grandparents; Heart Disease: Yes - Father; Hereditary Spherocytosis: No; Hypertension: No; Kidney Disease: No; Lung Disease: Yes - Mother; Seizures: No; Stroke: Yes - Father; Thyroid Problems: No; Tuberculosis: No; Current every day smoker - 1/2 ppd; Marital Status - Married; Alcohol Use: Rarely; Drug Use: No History; Caffeine Use: Moderate; Financial Concerns: No; Food, Clothing or Shelter Needs: No; Support System Lacking: No; Transportation Concerns: No Electronic Signature(s) Signed: 02/14/2023 12:51:55 PM By: Duanne Guess MD FACS Entered By: Duanne Guess on 02/14/2023 09:19:29 -------------------------------------------------------------------------------- SuperBill Details Patient Name: Date of Service: [name], [name]  02/14/2023 Medical Record Number: [id number] Patient Account Number: [id number] Date of Birth/Sex: Treating RN: 10-Apr-1955 (67 y.o. M) Primary Care Provider: Lynnea Ferrier Other Clinician: Referring Provider: Treating Provider/Extender: [name] in Treatment: 3 Diagnosis Coding ICD-10 Codes Code Description [phone] Non-pressure chronic ulcer of other part of right lower leg with fat layer exposed L97.822 Non-pressure chronic ulcer of other part of left lower leg with fat layer exposed I87.333 Chronic venous hypertension (idiopathic) with ulcer and inflammation of bilateral lower extremity I73.9 Peripheral vascular disease, unspecified Facility  Procedures : CPT4 Code: [id number] Description: 11042 - DEB SUBQ TISSUE 20 SQ CM/< ICD-10 Diagnosis Description L97.812 Non-pressure chronic ulcer of other part of right lower leg with fat layer exp L97.822 Non-pressure chronic ulcer of other part of left lower leg with fat layer expo Modifier: osed sed Quantity: 1 Physician Procedures : CPT4 Code Description Modifier [id number] 99214 - WC PHYS LEVEL 4 - EST PT ICD-10 Diagnosis Description L97.812 Non-pressure chronic ulcer of other part of right lower leg with fat layer exposed L97.822 Non-pressure chronic ulcer of other part of left  lower leg with fat layer exposed I87.333 Chronic venous hypertension (idiopathic) with ulcer and inflammation of bilateral lower extremity I73.9 Peripheral vascular disease, unspecified Quantity: 1 : [id number] 11042 - WC PHYS SUBQ TISS 20 SQ CM ICD-10 Diagnosis Description L97.812 Non-pressure chronic ulcer of other part of right lower leg with fat layer exposed L97.822 Non-pressure chronic ulcer of other part of left lower leg with fat layer exposed Quantity: 1 Electronic Signature(s) Signed: 02/14/2023 9:21:03 AM By: Duanne Guess MD FACS [place], Molly Maduro ([id number]) [id number] Page 12 of 12 Entered By: Duanne Guess on 02/14/2023 09:21:03

## 2023-02-14 NOTE — Progress Notes (Signed)
[name] ([id number]) [id number] Page 1 of 9 Visit Report for 02/14/2023 Arrival Information Details Patient Name: Date of Service: [name] 02/14/2023 8:15 A M Medical Record Number: [id number] Patient Account Number: [id number] Date of Birth/Sex: Treating RN: Aug 04, 1955 (67 y.o. M) Primary Care Carles Florea: Lynnea Ferrier Other Clinician: Referring Kaulana Brindle: Treating Sherlyn Ebbert/Extender: [name] in Treatment: 3 Visit Information History Since Last Visit Added or deleted any medications: No Patient Arrived: Ambulatory Any new allergies or adverse reactions: No Arrival Time: 08:19 Had a fall or experienced change in No Accompanied By: wife activities of daily living that may affect Transfer Assistance: None risk of falls: Patient Identification Verified: Yes Signs or symptoms of abuse/neglect since last visito No Secondary Verification Process Completed: Yes Hospitalized since last visit: No Implantable device outside of the clinic excluding No cellular tissue based products placed in the center since last visit: Pain Present Now: No Electronic Signature(s) Signed: 02/14/2023 10:47:43 AM By: Dayton Scrape Entered By: Dayton Scrape on 02/14/2023 05:19:55 -------------------------------------------------------------------------------- Compression Therapy Details Patient Name: Date of Service: [name] 02/14/2023 8:15 A M Medical Record Number: [id number] Patient Account Number: [id number] Date of Birth/Sex: Treating RN: July 07, 1955 (67 y.o. [name] Primary Care Kennedey Digilio: Lynnea Ferrier Other Clinician: Referring Kenadee Gates: Treating Javanna Patin/Extender: [name] in Treatment: 3 Compression Therapy Performed for Wound Assessment: Wound #1 Right,Lateral Lower Leg Performed By: Clinician Samuella Bruin, RN Compression Type: Double Layer Post Procedure  Diagnosis Same as Pre-procedure Electronic Signature(s) Signed: 02/14/2023 11:57:20 AM By: Samuella Bruin Entered By: Samuella Bruin on 02/14/2023 05:46:19 Compression Therapy Details -------------------------------------------------------------------------------- [name] ([id number]) [id number] Page 2 of 9 Patient Name: Date of Service: [name] 02/14/2023 8:15 A M Medical Record Number: [id number] Patient Account Number: [id number] Date of Birth/Sex: Treating RN: Nov 03, 1955 (67 y.o. [name] Primary Care Edin Skarda: Lynnea Ferrier Other Clinician: Referring Amparo Donalson: Treating Uchenna Seufert/Extender: [name] in Treatment: 3 Compression Therapy Performed for Wound Assessment: Wound #2 Left,Lateral Lower Leg Performed By: Clinician Samuella Bruin, RN Compression Type: Double Layer Post Procedure Diagnosis Same as Pre-procedure Electronic Signature(s) Signed: 02/14/2023 11:57:20 AM By: Gelene Mink By: Samuella Bruin on 02/14/2023 05:46:19 -------------------------------------------------------------------------------- Encounter Discharge Information Details Patient Name: Date of Service: [name] 02/14/2023 8:15 A M Medical Record Number: [id number] Patient Account Number: [id number] Date of Birth/Sex: Treating RN: 1955/09/07 (67 y.o. [name] Primary Care Emannuel Vise: Lynnea Ferrier Other Clinician: Referring Markavious Micco: Treating Vanellope Passmore/Extender: [name] in Treatment: 3 Encounter Discharge Information Items Post Procedure Vitals Discharge Condition: Stable Temperature (F): 97.3 Ambulatory Status: Ambulatory Pulse (bpm): 78 Discharge Destination: Home Respiratory Rate (breaths/min): 20 Transportation: Private Auto Blood Pressure (mmHg): 146/74 Accompanied By: spouse Schedule Follow-up Appointment: Yes Clinical Summary  of Care: Patient Declined Electronic Signature(s) Signed: 02/14/2023 1:08:41 PM By: Karie Schwalbe RN Entered By: Karie Schwalbe on 02/14/2023 05:49:27 -------------------------------------------------------------------------------- Lower Extremity Assessment Details Patient Name: Date of Service: [name] 02/14/2023 8:15 A M Medical Record Number: [id number] Patient Account Number: [id number] Date of Birth/Sex: Treating RN: 1955/05/14 (67 y.o. [name] Primary Care Icholas Irby: Lynnea Ferrier Other Clinician: Referring Jex Strausbaugh: Treating Alizon Schmeling/Extender: [name] in Treatment: 3 Edema Assessment Assessed: [Left: No] [Right: No] [Left: Edema] [Right: :] Calf Left: Right: Point of Measurement: From Medial Instep 40.7 cm 36.7 cm [place], Molly Maduro ([id number]) [id number] Page 3 of 9 Ankle Left: Right: Point of Measurement: From  Medial Instep 23.8 cm 23 cm Vascular Assessment Pulses: Dorsalis Pedis Palpable: [Left:Yes] [Right:Yes] Extremity colors, hair growth, and conditions: Extremity Color: [Left:Hyperpigmented] [Right:Hyperpigmented] Hair Growth on Extremity: [Left:No] [Right:No] Temperature of Extremity: [Left:Cool] [Right:Cool] Capillary Refill: [Left:< 3 seconds] [Right:< 3 seconds] Dependent Rubor: [Left:No No] [Right:No No] Electronic Signature(s) Signed: 02/14/2023 11:57:20 AM By: Samuella Bruin Entered By: Samuella Bruin on 02/14/2023 05:41:16 -------------------------------------------------------------------------------- Multi Wound Chart Details Patient Name: Date of Service: [name] 02/14/2023 8:15 A M Medical Record Number: [id number] Patient Account Number: [id number] Date of Birth/Sex: Treating RN: 02-27-1956 (67 y.o. M) Primary Care Lexie Morini: Lynnea Ferrier Other Clinician: Referring Dustie Brittle: Treating Charlina Dwight/Extender: [name] in  Treatment: 3 Vital Signs Height(in): 68 Pulse(bpm): 78 Weight(lbs): 208 Blood Pressure(mmHg): 146/74 Body Mass Index(BMI): 31.6 Temperature(F): 97.3 Respiratory Rate(breaths/min): 20 [1:Photos:] [N/A:N/A] Right, Lateral Lower Leg Left, Lateral Lower Leg N/A Wound Location: Gradually Appeared Gradually Appeared N/A Wounding Event: Venous Leg Ulcer Venous Leg Ulcer N/A Primary Etiology: Chronic Obstructive Pulmonary Chronic Obstructive Pulmonary N/A Comorbid History: Disease (COPD), Angina, Arrhythmia, Disease (COPD), Angina, Arrhythmia, Coronary Artery Disease, Coronary Artery Disease, Hypertension, Myocardial Infarction, Hypertension, Myocardial Infarction, Peripheral Venous Disease Peripheral Venous Disease 10/31/2022 01/13/2023 N/A Date Acquired: 3 3 N/A Weeks of Treatment: Open Open N/A Wound Status: No No N/A Wound Recurrence: 2.9x2.2x0.1 1x1.2x0.1 N/A Measurements L x W x D (cm) 5.011 0.942 N/A A (cm) : rea 0.501 0.094 N/A Volume (cm) : 46.80% 16.10% N/A % Reduction in Area: 46.80% 16.10% N/A % Reduction in Volume: Full Thickness Without Exposed Full Thickness Without Exposed N/A Classification: Support Structures Support Structures [place], Molly Maduro ([id number]) [id number] Page 4 of 9 Medium Medium N/A Exudate Amount: Serosanguineous Serosanguineous N/A Exudate Type: red, brown red, brown N/A Exudate Color: Distinct, outline attached Distinct, outline attached N/A Wound Margin: Large (67-100%) Medium (34-66%) N/A Granulation Amount: Red, Hyper-granulation Red N/A Granulation Quality: Small (1-33%) Medium (34-66%) N/A Necrotic Amount: Fat Layer (Subcutaneous Tissue): Yes Fat Layer (Subcutaneous Tissue): Yes N/A Exposed Structures: Fascia: No Fascia: No Tendon: No Tendon: No Muscle: No Muscle: No Joint: No Joint: No Bone: No Bone: No Large (67-100%) Medium (34-66%) N/A Epithelialization: Debridement - Excisional  Debridement - Excisional N/A Debridement: Pre-procedure Verification/Time Out 08:45 08:45 N/A Taken: Lidocaine 4% Topical Solution Lidocaine 4% Topical Solution N/A Pain Control: Subcutaneous, Slough Subcutaneous, Slough N/A Tissue Debrided: Skin/Subcutaneous Tissue Skin/Subcutaneous Tissue N/A Level: 5.01 0.94 N/A Debridement A (sq cm): rea Curette Curette N/A Instrument: Minimum Minimum N/A Bleeding: Pressure Pressure N/A Hemostasis A chieved: Procedure was tolerated well Procedure was tolerated well N/A Debridement Treatment Response: 2.9x2.2x0.1 1x1.2x0.1 N/A Post Debridement Measurements L x W x D (cm) 0.501 0.094 N/A Post Debridement Volume: (cm) No Abnormalities Noted No Abnormalities Noted N/A Periwound Skin Texture: No Abnormalities Noted No Abnormalities Noted N/A Periwound Skin Moisture: Hemosiderin Staining: Yes Hemosiderin Staining: Yes N/A Periwound Skin Color: No Abnormality No Abnormality N/A Temperature: Compression Therapy Compression Therapy N/A Procedures Performed: Debridement Debridement Treatment Notes Electronic Signature(s) Signed: 02/14/2023 9:18:48 AM By: Duanne Guess MD FACS Entered By: Duanne Guess on 02/14/2023 06:18:48 -------------------------------------------------------------------------------- Multi-Disciplinary Care Plan Details Patient Name: Date of Service: [name] 02/14/2023 8:15 A M Medical Record Number: [id number] Patient Account Number: [id number] Date of Birth/Sex: Treating RN: October 19, 1955 (67 y.o. [name] Primary Care Aela Bohan: Lynnea Ferrier Other Clinician: Referring Orlan Aversa: Treating Ersel Enslin/Extender: [name] in Treatment: 3 Multidisciplinary Care Plan reviewed with physician Active Inactive Venous Leg Ulcer Nursing Diagnoses: Actual venous  Insuffiency (use after diagnosis is confirmed) Knowledge deficit related to disease process and  management Goals: Patient will maintain optimal edema control Date Initiated: 01/23/2023 Target Resolution Date: 03/07/2023 Goal Status: Active Patient/caregiver will verbalize understanding of disease process and disease management Date Initiated: 01/23/2023 Target Resolution Date: 03/07/2023 Goal Status: Active Interventions: AUGUSTEN, TESFAY ([id number]) [phone].pdf Page 5 of 9 Assess peripheral edema status every visit. Compression as ordered Provide education on venous insufficiency Treatment Activities: Therapeutic compression applied : 01/23/2023 Notes: Wound/Skin Impairment Nursing Diagnoses: Impaired tissue integrity Knowledge deficit related to ulceration/compromised skin integrity Goals: Patient/caregiver will verbalize understanding of skin care regimen Date Initiated: 01/23/2023 Target Resolution Date: 03/07/2023 Goal Status: Active Interventions: Assess ulceration(s) every visit Treatment Activities: Skin care regimen initiated : 01/23/2023 Topical wound management initiated : 01/23/2023 Notes: Electronic Signature(s) Signed: 02/14/2023 11:57:20 AM By: Samuella Bruin Entered By: Samuella Bruin on 02/14/2023 05:46:48 -------------------------------------------------------------------------------- Pain Assessment Details Patient Name: Date of Service: [name] 02/14/2023 8:15 A M Medical Record Number: [id number] Patient Account Number: [id number] Date of Birth/Sex: Treating RN: 07-Aug-1955 (67 y.o. M) Primary Care [name]: Lynnea Ferrier Other Clinician: Referring Perri Lamagna: Treating Margarette Vannatter/Extender: [name] in Treatment: 3 Active Problems Location of Pain Severity and Description of Pain Patient Has Paino Yes Site Locations Rate the pain. Current Pain Level: 5 Worst Pain Level: 10 Least Pain Level: 0 Tolerable Pain Level: 2 Character of Pain Describe the Pain: SANDLER, BLODGETT ([id number]) [id number] Page 6 of 9 Pain Management and Medication Current Pain Management: Electronic Signature(s) Signed: 02/14/2023 10:47:43 AM By: Dayton Scrape Entered By: Dayton Scrape on 02/14/2023 05:21:24 -------------------------------------------------------------------------------- Patient/Caregiver Education Details Patient Name: Date of Service: [name] 11/15/2024andnbsp8:15 A M Medical Record Number: [id number] Patient Account Number: [id number] Date of Birth/Gender: Treating RN: [birth year] (67 y.o. [name] Primary Care Physician: Lynnea Ferrier Other Clinician: Referring Physician: Treating Physician/Extender: [name] in Treatment: 3 Education Assessment Education Provided To: Patient Education Topics Provided Wound/Skin Impairment: Methods: Explain/Verbal Responses: Reinforcements needed, State content correctly Electronic Signature(s) Signed: 02/14/2023 11:57:20 AM By: Samuella Bruin Entered By: Samuella Bruin on 02/14/2023 05:46:56 -------------------------------------------------------------------------------- Wound Assessment Details Patient Name: Date of Service: [name] 02/14/2023 8:15 A M Medical Record Number: [id number] Patient Account Number: [id number] Date of Birth/Sex: Treating RN: 01-27-1956 (67 y.o. M) Primary Care Emma-Lee Oddo: Lynnea Ferrier Other Clinician: Referring Hanne Kegg: Treating Kaveri Perras/Extender: [name] in Treatment: 3 Wound Status Wound Number: 1 Primary Venous Leg Ulcer Etiology: Wound Location: Right, Lateral Lower Leg Wound Open Wounding Event: Gradually Appeared Status: Date Acquired: 10/31/2022 Comorbid Chronic Obstructive Pulmonary Disease (COPD), Angina, Weeks Of Treatment: 3 History: Arrhythmia, Coronary Artery Disease, Hypertension, Myocardial Clustered Wound: No Infarction, Peripheral  Venous Disease Photos BIENVENIDO, NOVOSEL ([id number]) [id number] Page 7 of 9 Wound Measurements Length: (cm) 2.9 Width: (cm) 2.2 Depth: (cm) 0.1 Area: (cm) 5.011 Volume: (cm) 0.501 % Reduction in Area: 46.8% % Reduction in Volume: 46.8% Epithelialization: Large (67-100%) Tunneling: No Undermining: No Wound Description Classification: Full Thickness Without Exposed Suppor Wound Margin: Distinct, outline attached Exudate Amount: Medium Exudate Type: Serosanguineous Exudate Color: red, brown t Structures Foul Odor After Cleansing: No Slough/Fibrino Yes Wound Bed Granulation Amount: Large (67-100%) Exposed Structure Granulation Quality: Red, Hyper-granulation Fascia Exposed: No Necrotic Amount: Small (1-33%) Fat Layer (Subcutaneous Tissue) Exposed: Yes Necrotic Quality: Adherent Slough Tendon Exposed: No Muscle Exposed: No Joint Exposed: No Bone Exposed: No Periwound Skin Texture Texture Color No Abnormalities Noted:  Yes No Abnormalities Noted: No Hemosiderin Staining: Yes Moisture No Abnormalities Noted: Yes Temperature / Pain Temperature: No Abnormality Electronic Signature(s) Signed: 02/14/2023 11:57:20 AM By: Samuella Bruin Entered By: Samuella Bruin on 02/14/2023 05:41:38 -------------------------------------------------------------------------------- Wound Assessment Details Patient Name: Date of Service: [name] 02/14/2023 8:15 A M Medical Record Number: [id number] Patient Account Number: [id number] Date of Birth/Sex: Treating RN: [birth year] (67 y.o. M) Primary Care Toniann Dickerson: Lynnea Ferrier Other Clinician: Referring Julienne Vogler: Treating Ramisa Duman/Extender: [name] in Treatment: 3 Wound Status Wound Number: 2 Primary Venous Leg Ulcer Etiology: Wound Location: Left, Lateral Lower Leg Wound Open Wounding Event: Gradually Appeared Status: Date Acquired: 01/13/2023 Comorbid Chronic  Obstructive Pulmonary Disease (COPD), Angina, Weeks Of Treatment: 3 History: Arrhythmia, Coronary Artery Disease, Hypertension, Myocardial Clustered Wound: No Infarction, Peripheral Venous Disease Photos RYEL, FIORAVANTI ([id number]) [id number] Page 8 of 9 Wound Measurements Length: (cm) 1 Width: (cm) 1.2 Depth: (cm) 0.1 Area: (cm) 0.942 Volume: (cm) 0.094 % Reduction in Area: 16.1% % Reduction in Volume: 16.1% Epithelialization: Medium (34-66%) Tunneling: No Undermining: No Wound Description Classification: Full Thickness Without Exposed Support Structures Wound Margin: Distinct, outline attached Exudate Amount: Medium Exudate Type: Serosanguineous Exudate Color: red, brown Foul Odor After Cleansing: No Slough/Fibrino Yes Wound Bed Granulation Amount: Medium (34-66%) Exposed Structure Granulation Quality: Red Fascia Exposed: No Necrotic Amount: Medium (34-66%) Fat Layer (Subcutaneous Tissue) Exposed: Yes Tendon Exposed: No Muscle Exposed: No Joint Exposed: No Bone Exposed: No Periwound Skin Texture Texture Color No Abnormalities Noted: Yes No Abnormalities Noted: No Hemosiderin Staining: Yes Moisture No Abnormalities Noted: Yes Temperature / Pain Temperature: No Abnormality Electronic Signature(s) Signed: 02/14/2023 11:57:20 AM By: Samuella Bruin Entered By: Samuella Bruin on 02/14/2023 05:42:01 -------------------------------------------------------------------------------- Vitals Details Patient Name: Date of Service: [name] 02/14/2023 8:15 A M Medical Record Number: [id number] Patient Account Number: [id number] Date of Birth/Sex: Treating RN: [birth year] (67 y.o. M) Primary Care Nakeem Murnane: Lynnea Ferrier Other Clinician: Referring Josede Cicero: Treating Joie Hipps/Extender: [name] in Treatment: 3 Vital Signs Time Taken: 08:20 Temperature (F): 97.3 Height (in): 68 Pulse (bpm): 78 Weight  (lbs): 208 Respiratory Rate (breaths/min): 20 Body Mass Index (BMI): 31.6 Blood Pressure (mmHg): 146/74 Reference Range: 80 - 120 mg / dl ZIGGY, DICAPUA ([id number]) [phone].pdf Page 9 of 9 Electronic Signature(s) Signed: 02/14/2023 10:47:43 AM By: Dayton Scrape Entered By: Dayton Scrape on 02/14/2023 09:32:35

## 2023-02-19 ENCOUNTER — Encounter (HOSPITAL_BASED_OUTPATIENT_CLINIC_OR_DEPARTMENT_OTHER): Payer: Medicare Other | Admitting: General Surgery

## 2023-02-19 DIAGNOSIS — I87333 Chronic venous hypertension (idiopathic) with ulcer and inflammation of bilateral lower extremity: Secondary | ICD-10-CM | POA: Diagnosis not present

## 2023-02-19 NOTE — Progress Notes (Signed)
JAYMIN, GURIAN ([id number]) [id number] Page 1 of 10 Visit Report for 02/19/2023 Arrival Information Details Patient Name: Date of Service: [name] [name] 02/19/2023 9:15 A M Medical Record Number: [id number] Patient Account Number: [id number] Date of Birth/Sex: Treating RN: November 11, 1955 (67 y.o. [name] Primary Care Nickson Middlesworth: Lynnea Ferrier Other Clinician: Referring Kaedence Connelly: Treating Tassie Pollett/Extender: [name] in Treatment: 3 Visit Information History Since Last Visit Added or deleted any medications: No Patient Arrived: Ambulatory Any new allergies or adverse reactions: No Arrival Time: 09:18 Had a fall or experienced change in No Accompanied By: wife activities of daily living that may affect Transfer Assistance: None risk of falls: Patient Identification Verified: Yes Signs or symptoms of abuse/neglect since last visito No Secondary Verification Process Completed: Yes Hospitalized since last visit: No Implantable device outside of the clinic excluding No cellular tissue based products placed in the center since last visit: Has Dressing in Place as Prescribed: Yes Has Compression in Place as Prescribed: Yes Pain Present Now: No Electronic Signature(s) Signed: 02/19/2023 3:24:43 PM By: Samuella Bruin Entered By: Samuella Bruin on 02/19/2023 06:19:25 -------------------------------------------------------------------------------- Compression Therapy Details Patient Name: Date of Service: [name], RO [name] 02/19/2023 9:15 A M Medical Record Number: [id number] Patient Account Number: [id number] Date of Birth/Sex: Treating RN: 03/27/1956 (67 y.o. [name] Primary Care Leaann Nevils: Lynnea Ferrier Other Clinician: Referring Cheryllynn Sarff: Treating Domnique Vanegas/Extender: [name] in Treatment: 3 Compression Therapy Performed for Wound Assessment: Wound #1  Right,Lateral Lower Leg Performed By: Clinician Samuella Bruin, RN Compression Type: Double Layer Post Procedure Diagnosis Same as Pre-procedure Electronic Signature(s) Signed: 02/19/2023 3:24:43 PM By: Gelene Mink By: Samuella Bruin on 02/19/2023 06:35:40 [name] ([id number]) [id number] Page 2 of 10 -------------------------------------------------------------------------------- Compression Therapy Details Patient Name: Date of Service: [name] [name] 02/19/2023 9:15 A M Medical Record Number: [id number] Patient Account Number: [id number] Date of Birth/Sex: Treating RN: July 19, 1955 (67 y.o. [name] Primary Care Davie Claud: Lynnea Ferrier Other Clinician: Referring Kalyna Paolella: Treating Courtney Fenlon/Extender: [name] in Treatment: 3 Compression Therapy Performed for Wound Assessment: Wound #2 Left,Lateral Lower Leg Performed By: Clinician Samuella Bruin, RN Compression Type: Double Layer Post Procedure Diagnosis Same as Pre-procedure Electronic Signature(s) Signed: 02/19/2023 3:24:43 PM By: Gelene Mink By: Samuella Bruin on 02/19/2023 06:35:40 -------------------------------------------------------------------------------- Encounter Discharge Information Details Patient Name: Date of Service: [name], RO [name] 02/19/2023 9:15 A M Medical Record Number: [id number] Patient Account Number: [id number] Date of Birth/Sex: Treating RN: 03/25/1956 (67 y.o. [name] Primary Care Addisynn Vassell: Lynnea Ferrier Other Clinician: Referring Horatio Bertz: Treating Cinzia Devos/Extender: [name] in Treatment: 3 Encounter Discharge Information Items Post Procedure Vitals Discharge Condition: Stable Temperature (F): 97.7 Ambulatory Status: Ambulatory Pulse (bpm): 87 Discharge Destination: Home Respiratory Rate (breaths/min): 18 Transportation:  Private Auto Blood Pressure (mmHg): 113/56 Accompanied By: self Schedule Follow-up Appointment: Yes Clinical Summary of Care: Patient Declined Electronic Signature(s) Signed: 02/19/2023 3:24:43 PM By: Samuella Bruin Entered By: Samuella Bruin on 02/19/2023 06:51:28 -------------------------------------------------------------------------------- Lower Extremity Assessment Details Patient Name: Date of Service: [name], RO [name] 02/19/2023 9:15 A M Medical Record Number: [id number] Patient Account Number: [id number] Date of Birth/Sex: Treating RN: [birth year] (67 y.o. [name] Primary Care Ulysses Alper: Lynnea Ferrier Other Clinician: Referring Andilynn Delavega: Treating Vanderbilt Ranieri/Extender: [name] in Treatment: 3 Edema Assessment Assessed: [name]: No] [name]: No] [Left: Edema] [name]: :] [name], [name] ([id number]) [id number] Page 3 of 10 Left: Right: Point of  Measurement: From Medial Instep 39.5 cm 37.2 cm Ankle Left: Right: Point of Measurement: From Medial Instep 24 cm 23 cm Vascular Assessment Pulses: Dorsalis Pedis Palpable: [Left:Yes] [Right:Yes] Extremity colors, hair growth, and conditions: Extremity Color: [Left:Hyperpigmented] [Right:Hyperpigmented] Hair Growth on Extremity: [Left:No] [Right:No] Temperature of Extremity: [Left:Cool] [Right:Cool] Capillary Refill: [Left:< 3 seconds] [Right:< 3 seconds] Dependent Rubor: [Left:No No] [Right:No No] Electronic Signature(s) Signed: 02/19/2023 3:24:43 PM By: Samuella Bruin Entered By: Samuella Bruin on 02/19/2023 06:27:48 -------------------------------------------------------------------------------- Multi Wound Chart Details Patient Name: Date of Service: [name], RO [name] 02/19/2023 9:15 A M Medical Record Number: [id number] Patient Account Number: [id number] Date of Birth/Sex: Treating RN: 01/11/1956 (67 y.o. M) Primary Care Bralin Garry:  Lynnea Ferrier Other Clinician: Referring Deborahann Poteat: Treating Nattalie Santiesteban/Extender: [name] in Treatment: 3 Vital Signs Height(in): 68 Pulse(bpm): 87 Weight(lbs): 208 Blood Pressure(mmHg): 113/56 Body Mass Index(BMI): 31.6 Temperature(F): 97.7 Respiratory Rate(breaths/min): 18 [1:Photos:] [N/A:N/A] Right, Lateral Lower Leg Left, Lateral Lower Leg N/A Wound Location: Gradually Appeared Gradually Appeared N/A Wounding Event: Venous Leg Ulcer Venous Leg Ulcer N/A Primary Etiology: Chronic Obstructive Pulmonary Chronic Obstructive Pulmonary N/A Comorbid History: Disease (COPD), Angina, Arrhythmia, Disease (COPD), Angina, Arrhythmia, Coronary Artery Disease, Coronary Artery Disease, Hypertension, Myocardial Infarction, Hypertension, Myocardial Infarction, Peripheral Venous Disease Peripheral Venous Disease 10/31/2022 01/13/2023 N/A Date Acquired: 3 3 N/A Weeks of Treatment: Open Open N/A Wound Status: No No N/A Wound Recurrence: 3.3x2.3x0.1 1.1x1x0.1 N/A Measurements L x W x D (cm) 5.961 0.864 N/A A (cm) : rea 0.596 0.086 N/A Volume (cm) : 36.80% 23.10% N/A % Reduction in AreaDERRYL, [name] ([id number]) [id number] Page 4 of 10 36.70% 23.20% N/A % Reduction in Volume: Full Thickness Without Exposed Full Thickness Without Exposed N/A Classification: Support Structures Support Structures Medium Medium N/A Exudate A mount: Serosanguineous Serosanguineous N/A Exudate Type: red, brown red, brown N/A Exudate Color: Distinct, outline attached Distinct, outline attached N/A Wound Margin: Medium (34-66%) Small (1-33%) N/A Granulation A mount: Red, Hyper-granulation Red N/A Granulation Quality: Medium (34-66%) Large (67-100%) N/A Necrotic A mount: Fat Layer (Subcutaneous Tissue): Yes Fat Layer (Subcutaneous Tissue): Yes N/A Exposed Structures: Fascia: No Fascia: No Tendon: No Tendon: No Muscle: No Muscle:  No Joint: No Joint: No Bone: No Bone: No Large (67-100%) Medium (34-66%) N/A Epithelialization: Debridement - Excisional Debridement - Excisional N/A Debridement: Pre-procedure Verification/Time Out 09:34 09:34 N/A Taken: Lidocaine 4% Topical Solution Lidocaine 4% Topical Solution N/A Pain Control: Subcutaneous, Slough Necrotic/Eschar, Subcutaneous, N/A Tissue Debrided: Slough Skin/Subcutaneous Tissue Skin/Subcutaneous Tissue N/A Level: 5.96 0.86 N/A Debridement A (sq cm): rea Curette Curette N/A Instrument: Minimum Minimum N/A Bleeding: Pressure Pressure N/A Hemostasis A chieved: Procedure was tolerated well Procedure was tolerated well N/A Debridement Treatment Response: 3.3x2.3x0.1 1.1x1x0.1 N/A Post Debridement Measurements L x W x D (cm) 0.596 0.086 N/A Post Debridement Volume: (cm) No Abnormalities Noted No Abnormalities Noted N/A Periwound Skin Texture: No Abnormalities Noted No Abnormalities Noted N/A Periwound Skin Moisture: Hemosiderin Staining: Yes Hemosiderin Staining: Yes N/A Periwound Skin Color: No Abnormality No Abnormality N/A Temperature: Compression Therapy Compression Therapy N/A Procedures Performed: Debridement Debridement Treatment Notes Electronic Signature(s) Signed: 02/19/2023 9:40:05 AM By: Duanne Guess MD FACS Entered By: Duanne Guess on 02/19/2023 06:40:05 -------------------------------------------------------------------------------- Multi-Disciplinary Care Plan Details Patient Name: Date of Service: [name], RO [name] 02/19/2023 9:15 A M Medical Record Number: [id number] Patient Account Number: [id number] Date of Birth/Sex: Treating RN: [birth year] (67 y.o. [name] Primary Care Sheyna Pettibone: Lynnea Ferrier Other Clinician: Referring Isaack Preble: Treating Adrienne Trombetta/Extender: [name]  Weeks in Treatment: 3 Multidisciplinary Care Plan reviewed with physician Active Inactive Venous Leg  Ulcer Nursing Diagnoses: Actual venous Insuffiency (use after diagnosis is confirmed) Knowledge deficit related to disease process and management Goals: Patient will maintain optimal edema control Date Initiated: 01/23/2023 Target Resolution Date: 03/07/2023 Goal Status: Active Patient/caregiver will verbalize understanding of disease process and disease management [name], [name] ([id number]) [id number] Page 5 of 10 Date Initiated: 01/23/2023 Target Resolution Date: 03/07/2023 Goal Status: Active Interventions: Assess peripheral edema status every visit. Compression as ordered Provide education on venous insufficiency Treatment Activities: Therapeutic compression applied : 01/23/2023 Notes: Wound/Skin Impairment Nursing Diagnoses: Impaired tissue integrity Knowledge deficit related to ulceration/compromised skin integrity Goals: Patient/caregiver will verbalize understanding of skin care regimen Date Initiated: 01/23/2023 Target Resolution Date: 03/07/2023 Goal Status: Active Interventions: Assess ulceration(s) every visit Treatment Activities: Skin care regimen initiated : 01/23/2023 Topical wound management initiated : 01/23/2023 Notes: Electronic Signature(s) Signed: 02/19/2023 3:24:43 PM By: Samuella Bruin Entered By: Samuella Bruin on 02/19/2023 06:35:46 -------------------------------------------------------------------------------- Pain Assessment Details Patient Name: Date of Service: [name], RO [name] 02/19/2023 9:15 A M Medical Record Number: [id number] Patient Account Number: [id number] Date of Birth/Sex: Treating RN: 01/23/1956 (67 y.o. [name] Primary Care Treysen Sudbeck: Lynnea Ferrier Other Clinician: Referring Korine Winton: Treating Armonie Mettler/Extender: [name] in Treatment: 3 Active Problems Location of Pain Severity and Description of Pain Patient Has Paino No Site Locations Rate  the pain. Current Pain Level: 0 [name], [name] ([id number]) [id number] Page 6 of 10 Pain Management and Medication Current Pain Management: Electronic Signature(s) Signed: 02/19/2023 3:24:43 PM By: Samuella Bruin Entered By: Samuella Bruin on 02/19/2023 06:20:40 -------------------------------------------------------------------------------- Patient/Caregiver Education Details Patient Name: Date of Service: [name], RO [name] 11/20/2024andnbsp9:15 A M Medical Record Number: [id number] Patient Account Number: [id number] Date of Birth/Gender: Treating RN: 26-Sep-1955 (67 y.o. [name] Primary Care Physician: Lynnea Ferrier Other Clinician: Referring Physician: Treating Physician/Extender: [name] in Treatment: 3 Education Assessment Education Provided To: Patient Education Topics Provided Wound/Skin Impairment: Methods: Explain/Verbal Responses: Reinforcements needed, State content correctly Electronic Signature(s) Signed: 02/19/2023 3:24:43 PM By: Samuella Bruin Entered By: Samuella Bruin on 02/19/2023 06:35:58 -------------------------------------------------------------------------------- Wound Assessment Details Patient Name: Date of Service: [name], RO [name] 02/19/2023 9:15 A M Medical Record Number: [id number] Patient Account Number: [id number] Date of Birth/Sex: Treating RN: 1955/11/01 (67 y.o. [name] Primary Care Micai Apolinar: Lynnea Ferrier Other Clinician: Referring Ivone Licht: Treating Bowe Sidor/Extender: [name] in Treatment: 3 Wound Status Wound Number: 1 Primary Venous Leg Ulcer Etiology: Wound Location: Right, Lateral Lower Leg Wound Open Wounding Event: Gradually Appeared Status: Date Acquired: 10/31/2022 Comorbid Chronic Obstructive Pulmonary Disease (COPD), Angina, Weeks Of Treatment: 3 History: Arrhythmia, Coronary Artery Disease,  Hypertension, Myocardial Clustered Wound: No Infarction, Peripheral Venous Disease Photos [name], [name] ([id number]) [id number] Page 7 of 10 Wound Measurements Length: (cm) 3.3 Width: (cm) 2.3 Depth: (cm) 0.1 Area: (cm) 5.961 Volume: (cm) 0.596 % Reduction in Area: 36.8% % Reduction in Volume: 36.7% Epithelialization: Large (67-100%) Tunneling: No Undermining: No Wound Description Classification: Full Thickness Without Exposed Support Structures Wound Margin: Distinct, outline attached Exudate Amount: Medium Exudate Type: Serosanguineous Exudate Color: red, brown Foul Odor After Cleansing: No Slough/Fibrino Yes Wound Bed Granulation Amount: Medium (34-66%) Exposed Structure Granulation Quality: Red, Hyper-granulation Fascia Exposed: No Necrotic Amount: Medium (34-66%) Fat Layer (Subcutaneous Tissue) Exposed: Yes Necrotic Quality: Adherent Slough Tendon Exposed: No Muscle Exposed: No Joint Exposed: No Bone Exposed: No Periwound Skin Texture Texture Color  No Abnormalities Noted: Yes No Abnormalities Noted: No Hemosiderin Staining: Yes Moisture No Abnormalities Noted: Yes Temperature / Pain Temperature: No Abnormality Treatment Notes Wound #1 (Lower Leg) Wound Laterality: Right, Lateral Cleanser Soap and Water Discharge Instruction: May shower and wash wound with dial antibacterial soap and water prior to dressing change. Wound Cleanser Discharge Instruction: Cleanse the wound with wound cleanser prior to applying a clean dressing using gauze sponges, not tissue or cotton balls. Peri-Wound Care Sween Lotion (Moisturizing lotion) Discharge Instruction: Apply moisturizing lotion as directed Topical Gentamicin Discharge Instruction: As directed by physician Mupirocin Ointment Discharge Instruction: Apply Mupirocin (Bactroban) as instructed Primary Dressing Maxorb Extra Ag+ Alginate Dressing, 2x2 (in/in) Discharge Instruction: Apply to  wound bed as instructed Secondary Dressing ABD Pad, 5x9 Discharge Instruction: Apply over primary dressing as directed. Secured With [name], [name] ([id number]) [id number] Page 8 of 10 Compression Wrap Urgo K2 Lite, (equivalent to a 3 layer) two layer compression system, regular Discharge Instruction: Apply Urgo K2 Lite as directed (alternative to 3 layer compression). Compression Stockings Circaid Juxta Lite Compression Wrap Quantity: 1 Right Leg Compression Amount: 30-40 mmHg Discharge Instruction: Apply Circaid Juxta Lite Compression Wrap daily as instructed. Apply first thing in the morning, remove at night before bed. Add-Ons Electronic Signature(s) Signed: 02/19/2023 3:24:43 PM By: Samuella Bruin Entered By: Samuella Bruin on 02/19/2023 06:30:59 -------------------------------------------------------------------------------- Wound Assessment Details Patient Name: Date of Service: [name], RO [name] 02/19/2023 9:15 A M Medical Record Number: [id number] Patient Account Number: [id number] Date of Birth/Sex: Treating RN: [birth year] (67 y.o. [name] Primary Care Adiah Guereca: Lynnea Ferrier Other Clinician: Referring [name]: Treating Shaniyah Wix/Extender: [name] in Treatment: 3 Wound Status Wound Number: 2 Primary Venous Leg Ulcer Etiology: Wound Location: Left, Lateral Lower Leg Wound Open Wounding Event: Gradually Appeared Status: Date Acquired: 01/13/2023 Comorbid Chronic Obstructive Pulmonary Disease (COPD), Angina, Weeks Of Treatment: 3 History: Arrhythmia, Coronary Artery Disease, Hypertension, Myocardial Clustered Wound: No Infarction, Peripheral Venous Disease Photos Wound Measurements Length: (cm) 1.1 Width: (cm) 1 Depth: (cm) 0.1 Area: (cm) 0.864 Volume: (cm) 0.086 % Reduction in Area: 23.1% % Reduction in Volume: 23.2% Epithelialization: Medium (34-66%) Tunneling: No Undermining:  No Wound Description Classification: Full Thickness Without Exposed Support Structures Wound Margin: Distinct, outline attached Exudate Amount: Medium Exudate Type: Serosanguineous Exudate Color: red, brown Foul Odor After Cleansing: No Slough/Fibrino Yes Wound Bed Granulation Amount: Small (1-33%) Exposed Structure Granulation Quality: Red Fascia Exposed: No Necrotic Amount: Large (67-100%) Fat Layer (Subcutaneous Tissue) ExposedDAELON, [name] ([id number]) [id number] Page 9 of 10 Necrotic Quality: Adherent Slough Tendon Exposed: No Muscle Exposed: No Joint Exposed: No Bone Exposed: No Periwound Skin Texture Texture Color No Abnormalities Noted: Yes No Abnormalities Noted: No Hemosiderin Staining: Yes Moisture No Abnormalities Noted: Yes Temperature / Pain Temperature: No Abnormality Treatment Notes Wound #2 (Lower Leg) Wound Laterality: Left, Lateral Cleanser Soap and Water Discharge Instruction: May shower and wash wound with dial antibacterial soap and water prior to dressing change. Wound Cleanser Discharge Instruction: Cleanse the wound with wound cleanser prior to applying a clean dressing using gauze sponges, not tissue or cotton balls. Peri-Wound Care Sween Lotion (Moisturizing lotion) Discharge Instruction: Apply moisturizing lotion as directed Topical Gentamicin Discharge Instruction: As directed by physician Mupirocin Ointment Discharge Instruction: Apply Mupirocin (Bactroban) as instructed Primary Dressing Maxorb Extra Ag+ Alginate Dressing, 2x2 (in/in) Discharge Instruction: Apply to wound bed as instructed Secondary Dressing ABD Pad, 5x9 Discharge Instruction: Apply over primary dressing as directed. Secured With Compression Wrap Urgo K2  Lite, (equivalent to a 3 layer) two layer compression system, regular Discharge Instruction: Apply Urgo K2 Lite as directed (alternative to 3 layer compression). Compression  Stockings Circaid Juxta Lite Compression Wrap Quantity: 1 Left Leg Compression Amount: 30-40 mmHg Discharge Instruction: Apply Circaid Juxta Lite Compression Wrap daily as instructed. Apply first thing in the morning, remove at night before bed. Add-Ons Electronic Signature(s) Signed: 02/19/2023 3:24:43 PM By: Samuella Bruin Entered By: Samuella Bruin on 02/19/2023 06:31:19 -------------------------------------------------------------------------------- Vitals Details Patient Name: Date of Service: [name], RO [name] 02/19/2023 9:15 A M Medical Record Number: [id number] Patient Account Number: [id number] Date of Birth/Sex: Treating RN: 02/07/1956 (67 y.o. [name] Primary Care [name]: Lynnea Ferrier Other Clinician: Referring [name]: Treating Trayvon Trumbull/Extender: [name] [place], [name] ([id number]) [id number] Page 10 of 10 Weeks in Treatment: 3 Vital Signs Time Taken: 09:20 Temperature (F): 97.7 Height (in): 68 Pulse (bpm): 87 Weight (lbs): 208 Respiratory Rate (breaths/min): 18 Body Mass Index (BMI): 31.6 Blood Pressure (mmHg): 113/56 Reference Range: 80 - 120 mg / dl Electronic Signature(s) Signed: 02/19/2023 3:24:43 PM By: Samuella Bruin Entered By: Samuella Bruin on 02/19/2023 06:20:34

## 2023-02-19 NOTE — Progress Notes (Signed)
Gregory, [name] ([id number]) [id number] Page 1 of 12 Visit Report for 02/19/2023 Chief Complaint Document Details Patient Name: Date of Service: [name] [name] 02/19/2023 9:15 A M Medical Record Number: [id number] Patient Account Number: [id number] Date of Birth/Sex: Treating RN: [birth year] (67 y.o. M) Primary Care Provider: Lynnea Ferrier Other Clinician: Referring Provider: Treating Provider/Extender: [name] in Treatment: 3 Information Obtained from: Patient Chief Complaint Patient presents for treatment of open ulcers due to venous insufficiency Electronic Signature(s) Signed: 02/19/2023 9:40:11 AM By: Duanne Guess MD FACS Entered By: Duanne Guess on 02/19/2023 06:40:10 -------------------------------------------------------------------------------- Debridement Details Patient Name: Date of Service: [name], RO [name] 02/19/2023 9:15 A M Medical Record Number: [id number] Patient Account Number: [id number] Date of Birth/Sex: Treating RN: [birth year] (67 y.o. [name] Primary Care Provider: Lynnea Ferrier Other Clinician: Referring Provider: Treating Provider/Extender: [name] in Treatment: 3 Debridement Performed for Assessment: Wound #2 Left,Lateral Lower Leg Performed By: Physician Duanne Guess, MD The following information was scribed by: [name] The information was scribed for: Duanne Guess Debridement Type: Debridement Severity of Tissue Pre Debridement: Fat layer exposed Level of Consciousness (Pre-procedure): Awake and Alert Pre-procedure Verification/Time Out Yes - 09:34 Taken: Start Time: 09:34 Pain Control: Lidocaine 4% Topical Solution Percent of Wound Bed Debrided: 100% T Area Debrided (cm): otal 0.86 Tissue and other material debrided: Non-Viable, Eschar, Slough, Subcutaneous, Slough Level: Skin/Subcutaneous Tissue Debridement  Description: Excisional Instrument: Curette Bleeding: Minimum Hemostasis Achieved: Pressure Response to Treatment: Procedure was tolerated well Level of Consciousness (Post- Awake and Alert procedure): Post Debridement Measurements of Total Wound Length: (cm) 1.1 Width: (cm) 1 Depth: (cm) 0.1 Volume: (cm) 0.086 Character of Wound/Ulcer Post Debridement: Improved Severity of Tissue Post Debridement: Fat layer exposed [name] ([id number]) [id number] Page 2 of 12 Post Procedure Diagnosis Same as Pre-procedure Electronic Signature(s) Signed: 02/19/2023 9:46:17 AM By: Duanne Guess MD FACS Signed: 02/19/2023 3:24:43 PM By: [name] Entered By: [name] on 02/19/2023 06:35:08 -------------------------------------------------------------------------------- Debridement Details Patient Name: Date of Service: [name], RO [name] 02/19/2023 9:15 A M Medical Record Number: [id number] Patient Account Number: [id number] Date of Birth/Sex: Treating RN: [birth year] (67 y.o. [name] Primary Care Provider: Lynnea Ferrier Other Clinician: Referring Provider: Treating Provider/Extender: [name] in Treatment: 3 Debridement Performed for Assessment: Wound #1 Right,Lateral Lower Leg Performed By: Physician Duanne Guess, MD The following information was scribed by: [name] The information was scribed for: Duanne Guess Debridement Type: Debridement Severity of Tissue Pre Debridement: Fat layer exposed Level of Consciousness (Pre-procedure): Awake and Alert Pre-procedure Verification/Time Out Yes - 09:34 Taken: Start Time: 09:34 Pain Control: Lidocaine 4% T opical Solution Percent of Wound Bed Debrided: 100% T Area Debrided (cm): otal 5.96 Tissue and other material debrided: Non-Viable, Slough, Subcutaneous, Slough Level: Skin/Subcutaneous Tissue Debridement Description:  Excisional Instrument: Curette Bleeding: Minimum Hemostasis Achieved: Pressure Response to Treatment: Procedure was tolerated well Level of Consciousness (Post- Awake and Alert procedure): Post Debridement Measurements of Total Wound Length: (cm) 3.3 Width: (cm) 2.3 Depth: (cm) 0.1 Volume: (cm) 0.596 Character of Wound/Ulcer Post Debridement: Improved Severity of Tissue Post Debridement: Fat layer exposed Post Procedure Diagnosis Same as Pre-procedure Electronic Signature(s) Signed: 02/19/2023 9:46:17 AM By: Duanne Guess MD FACS Signed: 02/19/2023 3:24:43 PM By: [name] Entered By: [name] on 02/19/2023 06:36:31 HPI Details -------------------------------------------------------------------------------- [name] ([id number]) [id number] Page 3 of 12 Patient Name: Date of Service: [name], [place] [name] 02/19/2023 9:15 A M  Medical Record Number: [id number] Patient Account Number: [id number] Date of Birth/Sex: Treating RN: 20-Apr-1955 (67 y.o. M) Primary Care Provider: Lynnea Ferrier Other Clinician: Referring Provider: Treating Provider/Extender: [name] in Treatment: 3 History of Present Illness HPI Description: ADMISSION 01/23/2023 ***VASCULAR STUDIES:*** ABIs 11/2021: +-------+-----------+-----------+------------+------------+ ABI/TBIT oday's ABIT oday's TBIPrevious ABIPrevious TBI +-------+-----------+-----------+------------+------------+ Right 1.08 0.35 0.82 0.29  +-------+-----------+-----------+------------+------------+ Left 0.97 0.27 0.88 0.27  +-------+-----------+-----------+------------+------------+ Venous reflux 03/2021 Venous Reflux Times +--------------+---------+------+-----------+------------+--------+ RIGHT Reflux NoRefluxReflux TimeDiameter cmsComments    Yes     +--------------+---------+------+-----------+------------+--------+ CFV    yes  >1 second    +--------------+---------+------+-----------+------------+--------+ FV prox   yes  >1 second    +--------------+---------+------+-----------+------------+--------+ FV mid   yes  >1 second    +--------------+---------+------+-----------+------------+--------+ FV dist   yes  >1 second    +--------------+---------+------+-----------+------------+--------+ Popliteal   yes  >1 second    +--------------+---------+------+-----------+------------+--------+ GSV at SFJ   yes  >500 ms  0.736   +--------------+---------+------+-----------+------------+--------+ GSV prox thigh  yes  >500 ms  0.591   +--------------+---------+------+-----------+------------+--------+ GSV mid thigh   yes  >500 ms  0.591   +--------------+---------+------+-----------+------------+--------+ GSV dist thigh  yes  >500 ms  0.546   +--------------+---------+------+-----------+------------+--------+ GSV at knee no    0.592   +--------------+---------+------+-----------+------------+--------+ GSV prox calf     0.441   +--------------+---------+------+-----------+------------+--------+ GSV mid calf     0.461   +--------------+---------+------+-----------+------------+--------+ SSV Pop Fossa no    0.406   +--------------+---------+------+-----------+------------+--------+ SSV prox calf   yes  >500 ms  0.379   +--------------+---------+------+-----------+------------+--------+ SSV mid calf     0.379   +--------------+---------+------+-----------+------------+--------+ Summary: Right: - No evidence of deep vein thrombosis seen in the right lower extremity, from the common femoral through the popliteal veins. - No evidence of superficial venous thrombosis in the right lower extremity. - Venous reflux is noted in the right common femoral vein. - Venous reflux is noted in the right sapheno-femoral  junction. - Venous reflux is noted in the right greater saphenous vein in the thigh. - Venous reflux is noted in the right femoral vein. - Venous reflux is noted in the right popliteal vein. - Venous reflux is noted in the right short saphenous vein. This is a 67 year old smoker with a significant history of cardiac and peripheral vascular disease. He has had issues with venous ulceration on his right leg that have resolved with local care in the past, but the most recent wound has been present for a couple of months and has not healed despite treatment with Silvadene cream and home application of Unna boots. He also has developed 2 small ulcers on the left that have presented within the past couple of weeks. Of note, his right greater saphenous vein was harvested in 2023 for femoral above-knee popliteal bypass. He was referred here by his primary care provider for further evaluation and management of his lower extremity venous ulcers. 01/31/2023: The left medial lower leg wound is healed. The left lateral and right lateral lower leg wounds are both smaller. They have slough accumulation on their surfaces. Edema control is excellent. 02/05/2023: Both wounds are just slightly smaller. They are cleaner, however, with a bit of slough accumulation on the surfaces. Edema control remains good. 02/14/2023: The wounds are smaller again today. They have slough on the surface. Edema control is good. 02/19/2023: The wounds measured about the same size today, but there is more epithelialization occurring in the midportion of the wounds. There is slough accumulation once again. Edema control remains good. Kandler, Romin (  [id number]) [id number] Page 4 of 12 Electronic Signature(s) Signed: 02/19/2023 9:40:44 AM By: Duanne Guess MD FACS Entered By: Duanne Guess on 02/19/2023 06:40:44 -------------------------------------------------------------------------------- Physical Exam  Details Patient Name: Date of Service: [name], RO [name] 02/19/2023 9:15 A M Medical Record Number: [id number] Patient Account Number: [id number] Date of Birth/Sex: Treating RN: Sep 21, 1955 (67 y.o. M) Primary Care Provider: Lynnea Ferrier Other Clinician: Referring Provider: Treating Provider/Extender: [name] in Treatment: 3 Constitutional . . . . no acute distress. Respiratory Normal work of breathing on room air.. Notes 02/19/2023: The wounds measured about the same size today, but there is more epithelialization occurring in the midportion of the wounds. There is slough accumulation once again. Edema control remains good. Electronic Signature(s) Signed: 02/19/2023 9:41:07 AM By: Duanne Guess MD FACS Entered By: Duanne Guess on 02/19/2023 06:41:07 -------------------------------------------------------------------------------- Physician Orders Details Patient Name: Date of Service: [name], RO [name] 02/19/2023 9:15 A M Medical Record Number: [id number] Patient Account Number: [id number] Date of Birth/Sex: Treating RN: [birth year] (67 y.o. [name] Primary Care Provider: Lynnea Ferrier Other Clinician: Referring Provider: Treating Provider/Extender: [name] in Treatment: 3 The following information was scribed by: [name] The information was scribed for: Duanne Guess Verbal / Phone Orders: No Diagnosis Coding ICD-10 Coding Code Description 5798679962 Non-pressure chronic ulcer of other part of right lower leg with fat layer exposed L97.822 Non-pressure chronic ulcer of other part of left lower leg with fat layer exposed I87.333 Chronic venous hypertension (idiopathic) with ulcer and inflammation of bilateral lower extremity I73.9 Peripheral vascular disease, unspecified Follow-up Appointments ppointment in 1 week. - Dr. Lady Gary - room 2 Return A Anesthetic (In clinic) Topical  Lidocaine 4% applied to wound bed [address] VALDEMAR, COMAN ([id number]) [id number] Page 5 of 12 May shower with protection but do not get wound dressing(s) wet. Protect dressing(s) with water repellant cover (for example, large plastic bag) or a cast cover and may then take shower. Additional Orders / Instructions Follow Nutritious Diet - vitamin C 500 mg start once a day and if tolerated then twice a day, zinc 30-50 mg once daily, protein shakes daily Wound Treatment Wound #1 - Lower Leg Wound Laterality: Right, Lateral Cleanser: Soap and Water 1 x Per Week/30 Days Discharge Instructions: May shower and wash wound with dial antibacterial soap and water prior to dressing change. Cleanser: Wound Cleanser 1 x Per Week/30 Days Discharge Instructions: Cleanse the wound with wound cleanser prior to applying a clean dressing using gauze sponges, not tissue or cotton balls. Peri-Wound Care: Sween Lotion (Moisturizing lotion) 1 x Per Week/30 Days Discharge Instructions: Apply moisturizing lotion as directed Topical: Gentamicin 1 x Per Week/30 Days Discharge Instructions: As directed by physician Topical: Mupirocin Ointment 1 x Per Week/30 Days Discharge Instructions: Apply Mupirocin (Bactroban) as instructed Prim Dressing: Maxorb Extra Ag+ Alginate Dressing, 2x2 (in/in) 1 x Per Week/30 Days ary Discharge Instructions: Apply to wound bed as instructed Secondary Dressing: ABD Pad, 5x9 1 x Per Week/30 Days Discharge Instructions: Apply over primary dressing as directed. Compression Wrap: Urgo K2 Lite, (equivalent to a 3 layer) two layer compression system, regular 1 x Per Week/30 Days Discharge Instructions: Apply Urgo K2 Lite as directed (alternative to 3 layer compression). Compression Stockings: Circaid Juxta Lite Compression Wrap Right Leg Compression Amount: 30-40 mmHG Discharge Instructions: Apply Circaid Juxta Lite Compression Wrap daily as  instructed. Apply first thing in the morning, remove at night before bed. Wound #2 - Lower  Leg Wound Laterality: Left, Lateral Cleanser: Soap and Water 1 x Per Week/30 Days Discharge Instructions: May shower and wash wound with dial antibacterial soap and water prior to dressing change. Cleanser: Wound Cleanser 1 x Per Week/30 Days Discharge Instructions: Cleanse the wound with wound cleanser prior to applying a clean dressing using gauze sponges, not tissue or cotton balls. Peri-Wound Care: Sween Lotion (Moisturizing lotion) 1 x Per Week/30 Days Discharge Instructions: Apply moisturizing lotion as directed Topical: Gentamicin 1 x Per Week/30 Days Discharge Instructions: As directed by physician Topical: Mupirocin Ointment 1 x Per Week/30 Days Discharge Instructions: Apply Mupirocin (Bactroban) as instructed Prim Dressing: Maxorb Extra Ag+ Alginate Dressing, 2x2 (in/in) 1 x Per Week/30 Days ary Discharge Instructions: Apply to wound bed as instructed Secondary Dressing: ABD Pad, 5x9 1 x Per Week/30 Days Discharge Instructions: Apply over primary dressing as directed. Compression Wrap: Urgo K2 Lite, (equivalent to a 3 layer) two layer compression system, regular 1 x Per Week/30 Days Discharge Instructions: Apply Urgo K2 Lite as directed (alternative to 3 layer compression). Compression Stockings: Circaid Juxta Lite Compression Wrap Left Leg Compression Amount: 30-40 mmHG Discharge Instructions: Apply Circaid Juxta Lite Compression Wrap daily as instructed. Apply first thing in the morning, remove at night before bed. Patient Medications llergies: No Known Allergies A Notifications Medication Indication Start End 02/19/2023 lidocaine DOSE topical 4 % cream - cream topical DION, LATENDRESSE ([id number]) [id number] Page 6 of 12 Electronic Signature(s) Signed: 02/19/2023 9:46:17 AM By: Duanne Guess MD FACS Entered By: Duanne Guess on 02/19/2023  06:41:17 -------------------------------------------------------------------------------- Problem List Details Patient Name: Date of Service: [name], RO [name] 02/19/2023 9:15 A M Medical Record Number: [id number] Patient Account Number: [id number] Date of Birth/Sex: Treating RN: November 06, 1955 (67 y.o. M) Primary Care Provider: Lynnea Ferrier Other Clinician: Referring Provider: Treating Provider/Extender: [name] in Treatment: 3 Active Problems ICD-10 Encounter Code Description Active Date MDM Diagnosis L97.812 Non-pressure chronic ulcer of other part of right lower leg with fat layer 01/23/2023 No Yes exposed L97.822 Non-pressure chronic ulcer of other part of left lower leg with fat layer 01/23/2023 No Yes exposed I87.333 Chronic venous hypertension (idiopathic) with ulcer and inflammation of 01/23/2023 No Yes bilateral lower extremity I73.9 Peripheral vascular disease, unspecified 01/23/2023 No Yes Inactive Problems Resolved Problems Electronic Signature(s) Signed: 02/19/2023 9:37:42 AM By: Duanne Guess MD FACS Entered By: Duanne Guess on 02/19/2023 06:37:42 -------------------------------------------------------------------------------- Progress Note Details Patient Name: Date of Service: [name], RO [name] 02/19/2023 9:15 A M Medical Record Number: [id number] Patient Account Number: [id number] Date of Birth/Sex: Treating RN: 1955/12/16 (67 y.o. M) Primary Care Provider: Lynnea Ferrier Other Clinician: Referring Provider: Treating Provider/Extender: [name] in Treatment: 3 Subjective Chief Complaint THORWALD, GRAUL ([id number]) [id number] Page 7 of 12 Information obtained from Patient Patient presents for treatment of open ulcers due to venous insufficiency History of Present Illness (HPI) ADMISSION 01/23/2023 ***VASCULAR STUDIES:*** ABIs  11/2021: +-------+-----------+-----------+------------+------------+ ABI/TBIT oday's ABIT oday's TBIPrevious ABIPrevious TBI +-------+-----------+-----------+------------+------------+ Right 1.08 0.35 0.82 0.29  +-------+-----------+-----------+------------+------------+ Left 0.97 0.27 0.88 0.27  +-------+-----------+-----------+------------+------------+ Venous reflux 03/2021 Venous Reflux Times +--------------+---------+------+-----------+------------+--------+ RIGHT Reflux NoRefluxReflux TimeDiameter cmsComments    Yes     +--------------+---------+------+-----------+------------+--------+ CFV   yes  >1 second    +--------------+---------+------+-----------+------------+--------+ FV prox   yes  >1 second    +--------------+---------+------+-----------+------------+--------+ FV mid   yes  >1 second    +--------------+---------+------+-----------+------------+--------+ FV dist   yes  >1 second    +--------------+---------+------+-----------+------------+--------+ Popliteal   yes  >1  second    +--------------+---------+------+-----------+------------+--------+ GSV at [facility]   yes  >500 ms  0.736   +--------------+---------+------+-----------+------------+--------+ GSV prox thigh  yes  >500 ms  0.591   +--------------+---------+------+-----------+------------+--------+ GSV mid thigh   yes  >500 ms  0.591   +--------------+---------+------+-----------+------------+--------+ GSV dist thigh  yes  >500 ms  0.546   +--------------+---------+------+-----------+------------+--------+ GSV at knee no    0.592   +--------------+---------+------+-----------+------------+--------+ GSV prox calf     0.441   +--------------+---------+------+-----------+------------+--------+ GSV mid calf     0.461   +--------------+---------+------+-----------+------------+--------+ SSV Pop Fossa  no    0.406   +--------------+---------+------+-----------+------------+--------+ SSV prox calf   yes  >500 ms  0.379   +--------------+---------+------+-----------+------------+--------+ SSV mid calf     0.379   +--------------+---------+------+-----------+------------+--------+ Summary: Right: - No evidence of deep vein thrombosis seen in the right lower extremity, from the common femoral through the popliteal veins. - No evidence of superficial venous thrombosis in the right lower extremity. - Venous reflux is noted in the right common femoral vein. - Venous reflux is noted in the right sapheno-femoral junction. - Venous reflux is noted in the right greater saphenous vein in the thigh. - Venous reflux is noted in the right femoral vein. - Venous reflux is noted in the right popliteal vein. - Venous reflux is noted in the right short saphenous vein. This is a 67 year old smoker with a significant history of cardiac and peripheral vascular disease. He has had issues with venous ulceration on his right leg that have resolved with local care in the past, but the most recent wound has been present for a couple of months and has not healed despite treatment with Silvadene cream and home application of Unna boots. He also has developed 2 small ulcers on the left that have presented within the past couple of weeks. Of note, his right greater saphenous vein was harvested in 2023 for femoral above-knee popliteal bypass. He was referred here by his primary care provider for further evaluation and management of his lower extremity venous ulcers. 01/31/2023: The left medial lower leg wound is healed. The left lateral and right lateral lower leg wounds are both smaller. They have slough accumulation on their surfaces. Edema control is excellent. 02/05/2023: Both wounds are just slightly smaller. They are cleaner, however, with a bit of slough accumulation on the surfaces. Edema  control remains good. 02/14/2023: The wounds are smaller again today. They have slough on the surface. Edema control is good. 02/19/2023: The wounds measured about the same size today, but there is more epithelialization occurring in the midportion of the wounds. There is slough accumulation once again. Edema control remains good. Patient History Information obtained from Patient, Chart. Family History Cancer - Mother,Child, Diabetes - Maternal Grandparents, Heart Disease - Father, Lung Disease - Mother, Stroke - Caryl Bis JENTZEN, NAJARIAN ([id number]) [id number] Page 8 of 12 No family history of Hereditary Spherocytosis, Hypertension, Kidney Disease, Seizures, Thyroid Problems, Tuberculosis. Social History Current every day smoker - 1/2 ppd, Marital Status - Married, Alcohol Use - Rarely, Drug Use - No History, Caffeine Use - Moderate. Medical History Respiratory Patient has history of Chronic Obstructive Pulmonary Disease (COPD) Cardiovascular Patient has history of Angina, Arrhythmia - a-fib, Coronary Artery Disease, Hypertension, Myocardial Infarction, Peripheral Venous Disease Hospitalization/Surgery History - Incision and drainage of wound (Right). - Application of wound vac. - Femoral artery exploration (Right). - Lower extremity angiogram (Right). - Femoral-popliteal bypass graft (Right). - Abdominal aortogram w/lower extremity (Bilateral). - Colonoscopy with propofol. - Polypectomy. - Laparoscopic  appendectomy. - Coronary angioplasty with stent placement. - Aortic and mitral valve replacement. - Appendectomy. - Cardiac valve replacement. Medical A Surgical History Notes nd Cardiovascular rheumatic heart disease, Hx of artificial heart valve replacement, Mitral insufficiency and aortic stenosis Gastrointestinal hiatal hernia Neurologic stroke Oncologic skin cancer Objective Constitutional no acute distress. Vitals Time Taken: 9:20 AM, Height: 68 in,  Weight: 208 lbs, BMI: 31.6, Temperature: 97.7 F, Pulse: 87 bpm, Respiratory Rate: 18 breaths/min, Blood Pressure: 113/56 mmHg. Respiratory Normal work of breathing on room air.. General Notes: 02/19/2023: The wounds measured about the same size today, but there is more epithelialization occurring in the midportion of the wounds. There is slough accumulation once again. Edema control remains good. Integumentary (Hair, Skin) Wound #1 status is Open. Original cause of wound was Gradually Appeared. The date acquired was: 10/31/2022. The wound has been in treatment 3 weeks. The wound is located on the Right,Lateral Lower Leg. The wound measures 3.3cm length x 2.3cm width x 0.1cm depth; 5.961cm^2 area and 0.596cm^3 volume. There is Fat Layer (Subcutaneous Tissue) exposed. There is no tunneling or undermining noted. There is a medium amount of serosanguineous drainage noted. The wound margin is distinct with the outline attached to the wound base. There is medium (34-66%) red, hyper - granulation within the wound bed. There is a medium (34-66%) amount of necrotic tissue within the wound bed including Adherent Slough. The periwound skin appearance had no abnormalities noted for texture. The periwound skin appearance had no abnormalities noted for moisture. The periwound skin appearance exhibited: Hemosiderin Staining. Periwound temperature was noted as No Abnormality. Wound #2 status is Open. Original cause of wound was Gradually Appeared. The date acquired was: 01/13/2023. The wound has been in treatment 3 weeks. The wound is located on the Left,Lateral Lower Leg. The wound measures 1.1cm length x 1cm width x 0.1cm depth; 0.864cm^2 area and 0.086cm^3 volume. There is Fat Layer (Subcutaneous Tissue) exposed. There is no tunneling or undermining noted. There is a medium amount of serosanguineous drainage noted. The wound margin is distinct with the outline attached to the wound base. There is small (1-33%) red  granulation within the wound bed. There is a large (67-100%) amount of necrotic tissue within the wound bed including Adherent Slough. The periwound skin appearance had no abnormalities noted for texture. The periwound skin appearance had no abnormalities noted for moisture. The periwound skin appearance exhibited: Hemosiderin Staining. Periwound temperature was noted as No Abnormality. Assessment Active Problems ICD-10 Non-pressure chronic ulcer of other part of right lower leg with fat layer exposed Non-pressure chronic ulcer of other part of left lower leg with fat layer exposed Chronic venous hypertension (idiopathic) with ulcer and inflammation of bilateral lower extremity Peripheral vascular disease, unspecified Procedures LAMONE, ALBERTO ([id number]) [id number] Page 9 of 12 Wound #1 Pre-procedure diagnosis of Wound #1 is a Venous Leg Ulcer located on the Right,Lateral Lower Leg .Severity of Tissue Pre Debridement is: Fat layer exposed. There was a Excisional Skin/Subcutaneous Tissue Debridement with a total area of 5.96 sq cm performed by Duanne Guess, MD. With the following instrument(s): Curette to remove Non-Viable tissue/material. Material removed includes Subcutaneous Tissue and Slough and after achieving pain control using Lidocaine 4% T opical Solution. No specimens were taken. A time out was conducted at 09:34, prior to the start of the procedure. A Minimum amount of bleeding was controlled with Pressure. The procedure was tolerated well. Post Debridement Measurements: 3.3cm length x 2.3cm width x 0.1cm depth; 0.596cm^3 volume. Character of Wound/Ulcer Post  Debridement is improved. Severity of Tissue Post Debridement is: Fat layer exposed. Post procedure Diagnosis Wound #1: Same as Pre-Procedure Pre-procedure diagnosis of Wound #1 is a Venous Leg Ulcer located on the Right,Lateral Lower Leg . There was a Double Layer Compression Therapy Procedure  by Gregory Bruin, RN. Post procedure Diagnosis Wound #1: Same as Pre-Procedure Wound #2 Pre-procedure diagnosis of Wound #2 is a Venous Leg Ulcer located on the Left,Lateral Lower Leg .Severity of Tissue Pre Debridement is: Fat layer exposed. There was a Excisional Skin/Subcutaneous Tissue Debridement with a total area of 0.86 sq cm performed by Duanne Guess, MD. With the following instrument(s): Curette to remove Non-Viable tissue/material. Material removed includes Eschar, Subcutaneous Tissue, and Slough after achieving pain control using Lidocaine 4% T opical Solution. No specimens were taken. A time out was conducted at 09:34, prior to the start of the procedure. A Minimum amount of bleeding was controlled with Pressure. The procedure was tolerated well. Post Debridement Measurements: 1.1cm length x 1cm width x 0.1cm depth; 0.086cm^3 volume. Character of Wound/Ulcer Post Debridement is improved. Severity of Tissue Post Debridement is: Fat layer exposed. Post procedure Diagnosis Wound #2: Same as Pre-Procedure Pre-procedure diagnosis of Wound #2 is a Venous Leg Ulcer located on the Left,Lateral Lower Leg . There was a Double Layer Compression Therapy Procedure by Gregory Bruin, RN. Post procedure Diagnosis Wound #2: Same as Pre-Procedure Plan Follow-up Appointments: Return Appointment in 1 week. - Dr. Lady Gary - room 2 Anesthetic: (In clinic) Topical Lidocaine 4% applied to wound bed Bathing/ Shower/ Hygiene: May shower with protection but do not get wound dressing(s) wet. Protect dressing(s) with water repellant cover (for example, large plastic bag) or a cast cover and may then take shower. Additional Orders / Instructions: Follow Nutritious Diet - vitamin C 500 mg start once a day and if tolerated then twice a day, zinc 30-50 mg once daily, protein shakes daily The following medication(s) was prescribed: lidocaine topical 4 % cream cream topical was prescribed at  facility WOUND #1: - Lower Leg Wound Laterality: Right, Lateral Cleanser: Soap and Water 1 x Per Week/30 Days Discharge Instructions: May shower and wash wound with dial antibacterial soap and water prior to dressing change. Cleanser: Wound Cleanser 1 x Per Week/30 Days Discharge Instructions: Cleanse the wound with wound cleanser prior to applying a clean dressing using gauze sponges, not tissue or cotton balls. Peri-Wound Care: Sween Lotion (Moisturizing lotion) 1 x Per Week/30 Days Discharge Instructions: Apply moisturizing lotion as directed Topical: Gentamicin 1 x Per Week/30 Days Discharge Instructions: As directed by physician Topical: Mupirocin Ointment 1 x Per Week/30 Days Discharge Instructions: Apply Mupirocin (Bactroban) as instructed Prim Dressing: Maxorb Extra Ag+ Alginate Dressing, 2x2 (in/in) 1 x Per Week/30 Days ary Discharge Instructions: Apply to wound bed as instructed Secondary Dressing: ABD Pad, 5x9 1 x Per Week/30 Days Discharge Instructions: Apply over primary dressing as directed. Com pression Wrap: Urgo K2 Lite, (equivalent to a 3 layer) two layer compression system, regular 1 x Per Week/30 Days Discharge Instructions: Apply Urgo K2 Lite as directed (alternative to 3 layer compression). Com pression Stockings: Circaid Juxta Lite Compression Wrap Compression Amount: 30-40 mmHg (right) Discharge Instructions: Apply Circaid Juxta Lite Compression Wrap daily as instructed. Apply first thing in the morning, remove at night before bed. WOUND #2: - Lower Leg Wound Laterality: Left, Lateral Cleanser: Soap and Water 1 x Per Week/30 Days Discharge Instructions: May shower and wash wound with dial antibacterial soap and water prior to dressing change.  Cleanser: Wound Cleanser 1 x Per Week/30 Days Discharge Instructions: Cleanse the wound with wound cleanser prior to applying a clean dressing using gauze sponges, not tissue or cotton balls. Peri-Wound Care: Sween Lotion  (Moisturizing lotion) 1 x Per Week/30 Days Discharge Instructions: Apply moisturizing lotion as directed Topical: Gentamicin 1 x Per Week/30 Days Discharge Instructions: As directed by physician Topical: Mupirocin Ointment 1 x Per Week/30 Days Discharge Instructions: Apply Mupirocin (Bactroban) as instructed Prim Dressing: Maxorb Extra Ag+ Alginate Dressing, 2x2 (in/in) 1 x Per Week/30 Days ary Discharge Instructions: Apply to wound bed as instructed Secondary Dressing: ABD Pad, 5x9 1 x Per Week/30 Days Discharge Instructions: Apply over primary dressing as directed. Com pression Wrap: Urgo K2 Lite, (equivalent to a 3 layer) two layer compression system, regular 1 x Per Week/30 Days Discharge Instructions: Apply Urgo K2 Lite as directed (alternative to 3 layer compression). Com pression Stockings: Circaid Juxta Lite Compression Wrap Compression Amount: 30-40 mmHg (left) Discharge Instructions: Apply Circaid Juxta Lite Compression Wrap daily as instructed. Apply first thing in the morning, remove at night before KIRIN, BUELTEL ([id number]) [id number] Page 10 of 12 bed. 02/19/2023: The wounds measured about the same size today, but there is more epithelialization occurring in the midportion of the wounds. There is slough accumulation once again. Edema control remains good. I used a curette to debride slough and subcutaneous tissue from the right leg and slough, eschar, and subcutaneous tissue from the left leg. We will continue the mixture of topical gentamicin and mupirocin with silver alginate and Urgo light compression wraps bilaterally. Follow-up in 1 week. Electronic Signature(s) Signed: 02/19/2023 9:42:01 AM By: Duanne Guess MD FACS Entered By: Duanne Guess on 02/19/2023 06:42:00 -------------------------------------------------------------------------------- HxROS Details Patient Name: Date of Service: [name], RO [name] 02/19/2023 9:15 A M Medical  Record Number: [id number] Patient Account Number: [id number] Date of Birth/Sex: Treating RN: [birth year] (67 y.o. M) Primary Care Provider: Lynnea Ferrier Other Clinician: Referring Provider: Treating Provider/Extender: [name] in Treatment: 3 Information Obtained From Patient Chart Respiratory Medical History: Positive for: Chronic Obstructive Pulmonary Disease (COPD) Cardiovascular Medical History: Positive for: Angina; Arrhythmia - a-fib; Coronary Artery Disease; Hypertension; Myocardial Infarction; Peripheral Venous Disease Past Medical History Notes: rheumatic heart disease, Hx of artificial heart valve replacement, Mitral insufficiency and aortic stenosis Gastrointestinal Medical History: Past Medical History Notes: hiatal hernia Neurologic Medical History: Past Medical History Notes: stroke Oncologic Medical History: Past Medical History Notes: skin cancer Immunizations Pneumococcal Vaccine: Received Pneumococcal Vaccination: Yes Received Pneumococcal Vaccination On or After 60th Birthday: Yes Implantable Devices None Hospitalization / Surgery History ABBOTT, MICHAELIS ([id number]) [id number] Page 11 of 12 Type of Hospitalization/Surgery Incision and drainage of wound (Right) Application of wound vac Femoral artery exploration (Right) Lower extremity angiogram (Right) Femoral-popliteal bypass graft (Right) Abdominal aortogram w/lower extremity (Bilateral) Colonoscopy with propofol Polypectomy Laparoscopic appendectomy Coronary angioplasty with stent placement Aortic and mitral valve replacement Appendectomy Cardiac valve replacement Family and Social History Cancer: Yes - Mother,Child; Diabetes: Yes - Maternal Grandparents; Heart Disease: Yes - Father; Hereditary Spherocytosis: No; Hypertension: No; Kidney Disease: No; Lung Disease: Yes - Mother; Seizures: No; Stroke: Yes - Father; Thyroid Problems:  No; Tuberculosis: No; Current every day smoker - 1/2 ppd; Marital Status - Married; Alcohol Use: Rarely; Drug Use: No History; Caffeine Use: Moderate; Financial Concerns: No; Food, Clothing or Shelter Needs: No; Support System Lacking: No; Transportation Concerns: No Electronic Signature(s) Signed: 02/19/2023 9:46:17 AM By: Duanne Guess MD FACS Entered By: Duanne Guess on 02/19/2023  06:40:49 -------------------------------------------------------------------------------- SuperBill Details Patient Name: Date of ServiceDeanna Artis 02/19/2023 Medical Record Number: [id number] Patient Account Number: [id number] Date of Birth/Sex: Treating RN: [birth year] (67 y.o. M) Primary Care Provider: Lynnea Ferrier Other Clinician: Referring Provider: Treating Provider/Extender: [name] in Treatment: 3 Diagnosis Coding ICD-10 Codes Code Description [id number] Non-pressure chronic ulcer of other part of right lower leg with fat layer exposed L97.822 Non-pressure chronic ulcer of other part of left lower leg with fat layer exposed I87.333 Chronic venous hypertension (idiopathic) with ulcer and inflammation of bilateral lower extremity I73.9 Peripheral vascular disease, unspecified Facility Procedures : CPT4 Code: [id number] Description: 11042 - DEB SUBQ TISSUE 20 SQ CM/< ICD-10 Diagnosis Description L97.812 Non-pressure chronic ulcer of other part of right lower leg with fat layer exp L97.822 Non-pressure chronic ulcer of other part of left lower leg with fat layer expo Modifier: osed sed Quantity: 1 Physician Procedures : CPT4 Code Description Modifier [id number] 99214 - WC PHYS LEVEL 4 - EST PT ICD-10 Diagnosis Description L97.812 Non-pressure chronic ulcer of other part of right lower leg with fat layer exposed L97.822 Non-pressure chronic ulcer of other part of left  lower leg with fat layer exposed I87.333 Chronic venous hypertension (idiopathic) with ulcer  and inflammation of bilateral lower extremity I73.9 Peripheral vascular disease, unspecified Quantity: 1 : [id number] 11042 - WC PHYS SUBQ TISS 20 SQ CM ICD-10 Diagnosis Description L97.812 Non-pressure chronic ulcer of other part of right lower leg with fat layer exposed L97.822 Non-pressure chronic ulcer of other part of left lower leg with fat layer exposed  [name] ([id number]) [id number] Page Quantity: 1 12 of 12 Electronic Signature(s) Signed: 02/19/2023 9:42:41 AM By: Duanne Guess MD FACS Entered By: Duanne Guess on 02/19/2023 06:42:41

## 2023-02-25 ENCOUNTER — Encounter (HOSPITAL_BASED_OUTPATIENT_CLINIC_OR_DEPARTMENT_OTHER): Payer: Medicare Other | Admitting: General Surgery

## 2023-02-25 DIAGNOSIS — I87333 Chronic venous hypertension (idiopathic) with ulcer and inflammation of bilateral lower extremity: Secondary | ICD-10-CM | POA: Diagnosis not present

## 2023-02-25 NOTE — Progress Notes (Signed)
CHUEYEE, KOEPPEL ([id number]) [id number] Page 1 of 12 Visit Report for 02/25/2023 Chief Complaint Document Details Patient Name: Date of Service: [name] 02/25/2023 9:30 A M Medical Record Number: [id number] Patient Account Number: [id number] Date of Birth/Sex: Treating RN: 12/13/1955 (67 y.o. M) Primary Care Provider: Lynnea Ferrier Other Clinician: Referring Provider: Treating Provider/Extender: [name] in Treatment: 4 Information Obtained from: Patient Chief Complaint Patient presents for treatment of open ulcers due to venous insufficiency Electronic Signature(s) Signed: 02/25/2023 10:25:43 AM By: Duanne Guess MD FACS Entered By: Duanne Guess on 02/25/2023 10:25:43 -------------------------------------------------------------------------------- Debridement Details Patient Name: Date of Service: [name], [name] 02/25/2023 9:30 A M Medical Record Number: [id number] Patient Account Number: [id number] Date of Birth/Sex: Treating RN: 14-Dec-1955 (67 y.o. [name] Primary Care Provider: Lynnea Ferrier Other Clinician: Referring Provider: Treating Provider/Extender: [name] in Treatment: 4 Debridement Performed for Assessment: Wound #2 Left,Lateral Lower Leg Performed By: Physician Duanne Guess, MD The following information was scribed by: Shawn Stall The information was scribed for: Duanne Guess Debridement Type: Debridement Severity of Tissue Pre Debridement: Fat layer exposed Level of Consciousness (Pre-procedure): Awake and Alert Pre-procedure Verification/Time Out Yes - 09:50 Taken: Start Time: 09:51 Pain Control: Lidocaine 4% T opical Solution Percent of Wound Bed Debrided: 100% T Area Debrided (cm): otal 1.81 Tissue and other material debrided: Viable, Non-Viable, Slough, Subcutaneous, Slough Level: Skin/Subcutaneous Tissue Debridement  Description: Excisional Instrument: Curette Bleeding: Minimum Hemostasis Achieved: Pressure End Time: 10:02 Procedural Pain: 0 Post Procedural Pain: 0 Response to Treatment: Procedure was tolerated well Level of Consciousness (Post- Awake and Alert procedure): Post Debridement Measurements of Total Wound Length: (cm) 2.1 Width: (cm) 1.1 Depth: (cm) 0.1 Knapper, Praise ([id number]) [id number] Page 2 of 12 Volume: (cm) 0.181 Character of Wound/Ulcer Post Debridement: Improved Severity of Tissue Post Debridement: Fat layer exposed Post Procedure Diagnosis Same as Pre-procedure Electronic Signature(s) Signed: 02/25/2023 10:29:44 AM By: Duanne Guess MD FACS Signed: 02/25/2023 1:35:07 PM By: Shawn Stall RN, BSN Entered By: Shawn Stall on 02/25/2023 10:03:45 -------------------------------------------------------------------------------- Debridement Details Patient Name: Date of Service: [name], [name] 02/25/2023 9:30 A M Medical Record Number: [id number] Patient Account Number: [id number] Date of Birth/Sex: Treating RN: 23-Jun-1955 (67 y.o. [name] Primary Care Provider: Lynnea Ferrier Other Clinician: Referring Provider: Treating Provider/Extender: [name] in Treatment: 4 Debridement Performed for Assessment: Wound #1 Right,Lateral Lower Leg Performed By: Physician Duanne Guess, MD The following information was scribed by: Shawn Stall The information was scribed for: Duanne Guess Debridement Type: Debridement Severity of Tissue Pre Debridement: Fat layer exposed Level of Consciousness (Pre-procedure): Awake and Alert Pre-procedure Verification/Time Out Yes - 09:50 Taken: Start Time: 09:51 Pain Control: Lidocaine 4% T opical Solution Percent of Wound Bed Debrided: 100% T Area Debrided (cm): otal 6.32 Tissue and other material debrided: Viable, Non-Viable, Eschar, Slough, Subcutaneous,  Slough Level: Skin/Subcutaneous Tissue Debridement Description: Excisional Instrument: Curette Bleeding: Minimum Hemostasis Achieved: Pressure End Time: 10:02 Procedural Pain: 0 Post Procedural Pain: 0 Response to Treatment: Procedure was tolerated well Level of Consciousness (Post- Awake and Alert procedure): Post Debridement Measurements of Total Wound Length: (cm) 3.5 Width: (cm) 2.3 Depth: (cm) 0.1 Volume: (cm) 0.632 Character of Wound/Ulcer Post Debridement: Improved Severity of Tissue Post Debridement: Fat layer exposed Post Procedure Diagnosis Same as Pre-procedure Electronic Signature(s) Signed: 02/25/2023 10:29:44 AM By: Duanne Guess MD FACS Signed: 02/25/2023 1:35:07 PM By: Shawn Stall RN, BSN Entered By: Shawn Stall on  02/25/2023 10:03:53 STEELE, ZIMMERLY ([id number]) [phone].pdf Page 3 of 12 -------------------------------------------------------------------------------- HPI Details Patient Name: Date of Service: [name] 02/25/2023 9:30 A M Medical Record Number: [id number] Patient Account Number: [id number] Date of Birth/Sex: Treating RN: September 23, 1955 (67 y.o. M) Primary Care Provider: Lynnea Ferrier Other Clinician: Referring Provider: Treating Provider/Extender: [name] in Treatment: 4 History of Present Illness HPI Description: ADMISSION 01/23/2023 ***VASCULAR STUDIES:*** ABIs 11/2021: +-------+-----------+-----------+------------+------------+ ABI/TBIT oday's ABIT oday's TBIPrevious ABIPrevious TBI +-------+-----------+-----------+------------+------------+ Right 1.08 0.35 0.82 0.29  +-------+-----------+-----------+------------+------------+ Left 0.97 0.27 0.88 0.27  +-------+-----------+-----------+------------+------------+ Venous reflux 03/2021 Venous Reflux Times +--------------+---------+------+-----------+------------+--------+ RIGHT Reflux  NoRefluxReflux TimeDiameter cmsComments    Yes     +--------------+---------+------+-----------+------------+--------+ CFV   yes  >1 second    +--------------+---------+------+-----------+------------+--------+ FV prox   yes  >1 second    +--------------+---------+------+-----------+------------+--------+ FV mid   yes  >1 second    +--------------+---------+------+-----------+------------+--------+ FV dist   yes  >1 second    +--------------+---------+------+-----------+------------+--------+ Popliteal   yes  >1 second    +--------------+---------+------+-----------+------------+--------+ GSV at SFJ   yes  >500 ms  0.736   +--------------+---------+------+-----------+------------+--------+ GSV prox thigh  yes  >500 ms  0.591   +--------------+---------+------+-----------+------------+--------+ GSV mid thigh   yes  >500 ms  0.591   +--------------+---------+------+-----------+------------+--------+ GSV dist thigh  yes  >500 ms  0.546   +--------------+---------+------+-----------+------------+--------+ GSV at knee no    0.592   +--------------+---------+------+-----------+------------+--------+ GSV prox calf     0.441   +--------------+---------+------+-----------+------------+--------+ GSV mid calf     0.461   +--------------+---------+------+-----------+------------+--------+ SSV Pop Fossa no    0.406   +--------------+---------+------+-----------+------------+--------+ SSV prox calf   yes  >500 ms  0.379   +--------------+---------+------+-----------+------------+--------+ SSV mid calf     0.379   +--------------+---------+------+-----------+------------+--------+ Summary: Right: - No evidence of deep vein thrombosis seen in the right lower extremity, from the common femoral through the popliteal veins. - No evidence of superficial venous thrombosis in the right  lower extremity. - Venous reflux is noted in the right common femoral vein. - Venous reflux is noted in the right sapheno-femoral junction. - Venous reflux is noted in the right greater saphenous vein in the thigh. - Venous reflux is noted in the right femoral vein. - Venous reflux is noted in the right popliteal vein. - Venous reflux is noted in the right short saphenous vein. This is a 67 year old smoker with a significant history of cardiac and peripheral vascular disease. He has had issues with venous ulceration on his right leg that have resolved with local care in the past, but the most recent wound has been present for a couple of months and has not healed despite treatment with Silvadene cream and home application of Unna boots. He also has developed 2 small ulcers on the left that have presented within the past couple of weeks. Of note, his right greater saphenous vein was harvested in 2023 for femoral above-knee popliteal bypass. He was referred here by his primary care provider for further evaluation and management of his lower extremity venous ulcers. HOBBS, PAROLA ([id number]) [id number] Page 4 of 12 01/31/2023: The left medial lower leg wound is healed. The left lateral and right lateral lower leg wounds are both smaller. They have slough accumulation on their surfaces. Edema control is excellent. 02/05/2023: Both wounds are just slightly smaller. They are cleaner, however, with a bit of slough accumulation on the surfaces. Edema control remains good. 02/14/2023: The wounds are smaller again today. They have slough on the surface. Edema control  is good. 02/19/2023: The wounds measured about the same size today, but there is more epithelialization occurring in the midportion of the wounds. There is slough accumulation once again. Edema control remains good. 02/25/2023: The left lateral leg wound measures slightly longer today. Both wounds have more slough  on them this week then on prior occasions. Edema control is good. No erythema, induration, or malodor to suggest infection. Electronic Signature(s) Signed: 02/25/2023 10:26:24 AM By: Duanne Guess MD FACS Entered By: Duanne Guess on 02/25/2023 10:26:24 -------------------------------------------------------------------------------- Physical Exam Details Patient Name: Date of Service: [name], [name] 02/25/2023 9:30 A M Medical Record Number: [id number] Patient Account Number: [id number] Date of Birth/Sex: Treating RN: 09-16-1955 (67 y.o. M) Primary Care Provider: Lynnea Ferrier Other Clinician: Referring Provider: Treating Provider/Extender: [name] in Treatment: 4 Constitutional . . . . no acute distress. Respiratory Normal work of breathing on room air.. Notes 02/25/2023: The left lateral leg wound measures slightly longer today. Both wounds have more slough on them this week then on prior occasions. Edema control is good. No erythema, induration, or malodor to suggest infection. Electronic Signature(s) Signed: 02/25/2023 10:26:55 AM By: Duanne Guess MD FACS Entered By: Duanne Guess on 02/25/2023 10:26:54 -------------------------------------------------------------------------------- Physician Orders Details Patient Name: Date of Service: [name], [name] 02/25/2023 9:30 A M Medical Record Number: [id number] Patient Account Number: [id number] Date of Birth/Sex: Treating RN: 12/07/1955 (67 y.o. [name] Primary Care Provider: Lynnea Ferrier Other Clinician: Referring Provider: Treating Provider/Extender: [name] in Treatment: 4 The following information was scribed by: Shawn Stall The information was scribed for: Duanne Guess Verbal / Phone Orders: No Diagnosis Coding ICD-10 Coding Code Description [phone] Non-pressure chronic ulcer of other part of right lower leg with fat layer  exposed L97.822 Non-pressure chronic ulcer of other part of left lower leg with fat layer exposed [name] ([id number]) [phone].pdf Page 5 of 12 [phone] Chronic venous hypertension (idiopathic) with ulcer and inflammation of bilateral lower extremity I73.9 Peripheral vascular disease, unspecified Follow-up Appointments Return appointment in 3 weeks. - Dr. Lady Gary 03/18/2023 1000 (already scheduled) Nurse Visit: - (already scheduled) 03/04/2023 1015 (already scheduled) 03/11/2023 1015 Anesthetic (In clinic) Topical Lidocaine 4% applied to wound bed Bathing/ Shower/ Hygiene May shower with protection but do not get wound dressing(s) wet. Protect dressing(s) with water repellant cover (for example, large plastic bag) or a cast cover and may then take shower. Edema Control - Orders / Instructions Elevate legs to the level of the heart or above for 30 minutes daily and/or when sitting for 3-4 times a day throughout the day. Avoid standing for long periods of time. Exercise regularly Additional Orders / Instructions Follow Nutritious Diet - vitamin C 500 mg start once a day and if tolerated then twice a day, zinc 30-50 mg once daily, protein shakes daily Wound Treatment Wound #1 - Lower Leg Wound Laterality: Right, Lateral Cleanser: Soap and Water 1 x Per Week/30 Days Discharge Instructions: May shower and wash wound with dial antibacterial soap and water prior to dressing change. Cleanser: Wound Cleanser 1 x Per Week/30 Days Discharge Instructions: Cleanse the wound with wound cleanser prior to applying a clean dressing using gauze sponges, not tissue or cotton balls. Peri-Wound Care: Sween Lotion (Moisturizing lotion) 1 x Per Week/30 Days Discharge Instructions: Apply moisturizing lotion as directed Topical: Gentamicin 1 x Per Week/30 Days Discharge Instructions: As directed by physician Topical: Mupirocin Ointment 1 x Per Week/30 Days Discharge Instructions:  Apply  Mupirocin (Bactroban) as instructed Prim Dressing: Hydrofera Blue Ready Transfer Foam, 2.5x2.5 (in/in) 1 x Per Week/30 Days ary Discharge Instructions: Apply directly to wound bed as directed Secondary Dressing: ABD Pad, 5x9 1 x Per Week/30 Days Discharge Instructions: Apply over primary dressing as directed. Compression Wrap: Urgo K2 Lite, (equivalent to a 3 layer) two layer compression system, regular 1 x Per Week/30 Days Discharge Instructions: Apply Urgo K2 Lite as directed (alternative to 3 layer compression). Wound #2 - Lower Leg Wound Laterality: Left, Lateral Cleanser: Soap and Water 1 x Per Week/30 Days Discharge Instructions: May shower and wash wound with dial antibacterial soap and water prior to dressing change. Cleanser: Wound Cleanser 1 x Per Week/30 Days Discharge Instructions: Cleanse the wound with wound cleanser prior to applying a clean dressing using gauze sponges, not tissue or cotton balls. Peri-Wound Care: Sween Lotion (Moisturizing lotion) 1 x Per Week/30 Days Discharge Instructions: Apply moisturizing lotion as directed Topical: Gentamicin 1 x Per Week/30 Days Discharge Instructions: As directed by physician Topical: Mupirocin Ointment 1 x Per Week/30 Days Discharge Instructions: Apply Mupirocin (Bactroban) as instructed Prim Dressing: Hydrofera Blue Ready Transfer Foam, 2.5x2.5 (in/in) 1 x Per Week/30 Days ary Discharge Instructions: Apply directly to wound bed as directed Secondary Dressing: ABD Pad, 5x9 1 x Per Week/30 Days Discharge Instructions: Apply over primary dressing as directed. Compression Wrap: Urgo K2 Lite, (equivalent to a 3 layer) two layer compression system, regular 1 x Per Week/30 Days Discharge Instructions: Apply Urgo K2 Lite as directed (alternative to 3 layer compression). Electronic Signature(s) GATLIN, GUTHRIE ([id number]) [id number] Page 6 of 12 Signed: 02/25/2023 10:29:44 AM By: Duanne Guess MD  FACS Entered By: Duanne Guess on 02/25/2023 10:27:11 -------------------------------------------------------------------------------- Problem List Details Patient Name: Date of Service: [name], [name] 02/25/2023 9:30 A M Medical Record Number: [id number] Patient Account Number: [id number] Date of Birth/Sex: Treating RN: 1955-05-13 (68 y.o. Harlon Flor, Millard.[name] Primary Care Provider: Lynnea Ferrier Other Clinician: Referring Provider: Treating Provider/Extender: [name] in Treatment: 4 Active Problems ICD-10 Encounter Code Description Active Date MDM Diagnosis [id number] Non-pressure chronic ulcer of other part of right lower leg with fat layer 01/23/2023 No Yes exposed L97.822 Non-pressure chronic ulcer of other part of left lower leg with fat layer 01/23/2023 No Yes exposed I87.333 Chronic venous hypertension (idiopathic) with ulcer and inflammation of 01/23/2023 No Yes bilateral lower extremity I73.9 Peripheral vascular disease, unspecified 01/23/2023 No Yes Inactive Problems Resolved Problems Electronic Signature(s) Signed: 02/25/2023 10:24:34 AM By: Duanne Guess MD FACS Entered By: Duanne Guess on 02/25/2023 10:24:34 -------------------------------------------------------------------------------- Progress Note Details Patient Name: Date of Service: [name], [name] 02/25/2023 9:30 A M Medical Record Number: [id number] Patient Account Number: [id number] Date of Birth/Sex: Treating RN: 10-24-1955 (67 y.o. M) Primary Care Provider: Lynnea Ferrier Other Clinician: Referring Provider: Treating Provider/Extender: [name] in Treatment: 4 Subjective Chief Complaint Information obtained from Patient [name], [name] ([id number]) [id number] Page 7 of 12 Patient presents for treatment of open ulcers due to venous insufficiency History of Present Illness  (HPI) ADMISSION 01/23/2023 ***VASCULAR STUDIES:*** ABIs 11/2021: +-------+-----------+-----------+------------+------------+ ABI/TBIT oday's ABIT oday's TBIPrevious ABIPrevious TBI +-------+-----------+-----------+------------+------------+ Right 1.08 0.35 0.82 0.29  +-------+-----------+-----------+------------+------------+ Left 0.97 0.27 0.88 0.27  +-------+-----------+-----------+------------+------------+ Venous reflux 03/2021 Venous Reflux Times +--------------+---------+------+-----------+------------+--------+ RIGHT Reflux NoRefluxReflux TimeDiameter cmsComments    Yes     +--------------+---------+------+-----------+------------+--------+ CFV   yes  >1 second    +--------------+---------+------+-----------+------------+--------+ FV prox   yes  >1 second    +--------------+---------+------+-----------+------------+--------+ FV  mid   yes  >1 second    +--------------+---------+------+-----------+------------+--------+ FV dist   yes  >1 second    +--------------+---------+------+-----------+------------+--------+ Popliteal   yes  >1 second    +--------------+---------+------+-----------+------------+--------+ GSV at [facility]   yes  >500 ms  0.736   +--------------+---------+------+-----------+------------+--------+ GSV prox thigh  yes  >500 ms  0.591   +--------------+---------+------+-----------+------------+--------+ GSV mid thigh   yes  >500 ms  0.591   +--------------+---------+------+-----------+------------+--------+ GSV dist thigh  yes  >500 ms  0.546   +--------------+---------+------+-----------+------------+--------+ GSV at knee no    0.592   +--------------+---------+------+-----------+------------+--------+ GSV prox calf     0.441   +--------------+---------+------+-----------+------------+--------+ GSV mid calf     0.461    +--------------+---------+------+-----------+------------+--------+ SSV Pop Fossa no    0.406   +--------------+---------+------+-----------+------------+--------+ SSV prox calf   yes  >500 ms  0.379   +--------------+---------+------+-----------+------------+--------+ SSV mid calf     0.379   +--------------+---------+------+-----------+------------+--------+ Summary: Right: - No evidence of deep vein thrombosis seen in the right lower extremity, from the common femoral through the popliteal veins. - No evidence of superficial venous thrombosis in the right lower extremity. - Venous reflux is noted in the right common femoral vein. - Venous reflux is noted in the right sapheno-femoral junction. - Venous reflux is noted in the right greater saphenous vein in the thigh. - Venous reflux is noted in the right femoral vein. - Venous reflux is noted in the right popliteal vein. - Venous reflux is noted in the right short saphenous vein. This is a 67 year old smoker with a significant history of cardiac and peripheral vascular disease. He has had issues with venous ulceration on his right leg that have resolved with local care in the past, but the most recent wound has been present for a couple of months and has not healed despite treatment with Silvadene cream and home application of Unna boots. He also has developed 2 small ulcers on the left that have presented within the past couple of weeks. Of note, his right greater saphenous vein was harvested in 2023 for femoral above-knee popliteal bypass. He was referred here by his primary care provider for further evaluation and management of his lower extremity venous ulcers. 01/31/2023: The left medial lower leg wound is healed. The left lateral and right lateral lower leg wounds are both smaller. They have slough accumulation on their surfaces. Edema control is excellent. 02/05/2023: Both wounds are just slightly smaller.  They are cleaner, however, with a bit of slough accumulation on the surfaces. Edema control remains good. 02/14/2023: The wounds are smaller again today. They have slough on the surface. Edema control is good. 02/19/2023: The wounds measured about the same size today, but there is more epithelialization occurring in the midportion of the wounds. There is slough accumulation once again. Edema control remains good. 02/25/2023: The left lateral leg wound measures slightly longer today. Both wounds have more slough on them this week then on prior occasions. Edema control is good. No erythema, induration, or malodor to suggest infection. Patient History Information obtained from Patient, Chart. PIETER, BACHAND ([id number]) [id number] Page 8 of 12 Family History Cancer - Mother,Child, Diabetes - Maternal Grandparents, Heart Disease - Father, Lung Disease - Mother, Stroke - Father, No family history of Hereditary Spherocytosis, Hypertension, Kidney Disease, Seizures, Thyroid Problems, Tuberculosis. Social History Current every day smoker - 1/2 ppd, Marital Status - Married, Alcohol Use - Rarely, Drug Use - No History, Caffeine Use - Moderate. Medical History Respiratory Patient has history of Chronic Obstructive  Pulmonary Disease (COPD) Cardiovascular Patient has history of Angina, Arrhythmia - a-fib, Coronary Artery Disease, Hypertension, Myocardial Infarction, Peripheral Venous Disease Hospitalization/Surgery History - Incision and drainage of wound (Right). - Application of wound vac. - Femoral artery exploration (Right). - Lower extremity angiogram (Right). - Femoral-popliteal bypass graft (Right). - Abdominal aortogram w/lower extremity (Bilateral). - Colonoscopy with propofol. - Polypectomy. - Laparoscopic appendectomy. - Coronary angioplasty with stent placement. - Aortic and mitral valve replacement. - Appendectomy. - Cardiac valve replacement. Medical A Surgical  History Notes nd Cardiovascular rheumatic heart disease, Hx of artificial heart valve replacement, Mitral insufficiency and aortic stenosis Gastrointestinal hiatal hernia Neurologic stroke Oncologic skin cancer Objective Constitutional no acute distress. Vitals Time Taken: 9:40 AM, Height: 68 in, Weight: 208 lbs, BMI: 31.6, Temperature: 98.1 F, Pulse: 75 bpm, Respiratory Rate: 20 breaths/min, Blood Pressure: 118/59 mmHg. Respiratory Normal work of breathing on room air.. General Notes: 02/25/2023: The left lateral leg wound measures slightly longer today. Both wounds have more slough on them this week then on prior occasions. Edema control is good. No erythema, induration, or malodor to suggest infection. Integumentary (Hair, Skin) Wound #1 status is Open. Original cause of wound was Gradually Appeared. The date acquired was: 10/31/2022. The wound has been in treatment 4 weeks. The wound is located on the Right,Lateral Lower Leg. The wound measures 3.5cm length x 2.3cm width x 0.1cm depth; 6.322cm^2 area and 0.632cm^3 volume. There is Fat Layer (Subcutaneous Tissue) exposed. There is no tunneling or undermining noted. There is a medium amount of serosanguineous drainage noted. The wound margin is distinct with the outline attached to the wound base. There is medium (34-66%) red, hyper - granulation within the wound bed. There is a medium (34-66%) amount of necrotic tissue within the wound bed including Adherent Slough. The periwound skin appearance had no abnormalities noted for texture. The periwound skin appearance had no abnormalities noted for moisture. The periwound skin appearance exhibited: Hemosiderin Staining. The periwound skin appearance did not exhibit: Atrophie Blanche, Cyanosis, Ecchymosis, Mottled, Pallor, Rubor, Erythema. Periwound temperature was noted as No Abnormality. Wound #2 status is Open. Original cause of wound was Gradually Appeared. The date acquired was:  01/13/2023. The wound has been in treatment 4 weeks. The wound is located on the Left,Lateral Lower Leg. The wound measures 2.1cm length x 1.1cm width x 0.1cm depth; 1.814cm^2 area and 0.181cm^3 volume. There is Fat Layer (Subcutaneous Tissue) exposed. There is no tunneling or undermining noted. There is a medium amount of serosanguineous drainage noted. The wound margin is distinct with the outline attached to the wound base. There is large (67-100%) red granulation within the wound bed. There is a small (1-33%) amount of necrotic tissue within the wound bed including Adherent Slough. The periwound skin appearance had no abnormalities noted for texture. The periwound skin appearance had no abnormalities noted for moisture. The periwound skin appearance exhibited: Hemosiderin Staining. The periwound skin appearance did not exhibit: Atrophie Blanche, Cyanosis, Ecchymosis, Mottled, Pallor, Rubor, Erythema. Periwound temperature was noted as No Abnormality. Assessment Active Problems ICD-10 Non-pressure chronic ulcer of other part of right lower leg with fat layer exposed Non-pressure chronic ulcer of other part of left lower leg with fat layer exposed Chronic venous hypertension (idiopathic) with ulcer and inflammation of bilateral lower extremity Peripheral vascular disease, unspecified JAMONTE, KLEMM ([id number]) [id number] Page 9 of 12 Procedures Wound #1 Pre-procedure diagnosis of Wound #1 is a Venous Leg Ulcer located on the Right,Lateral Lower Leg .Severity of Tissue Pre Debridement is: Fat  layer exposed. There was a Excisional Skin/Subcutaneous Tissue Debridement with a total area of 6.32 sq cm performed by Duanne Guess, MD. With the following instrument(s): Curette to remove Viable and Non-Viable tissue/material. Material removed includes Eschar, Subcutaneous Tissue, and Slough after achieving pain control using Lidocaine 4% Topical Solution. A time out was  conducted at 09:50, prior to the start of the procedure. A Minimum amount of bleeding was controlled with Pressure. The procedure was tolerated well with a pain level of 0 throughout and a pain level of 0 following the procedure. Post Debridement Measurements: 3.5cm length x 2.3cm width x 0.1cm depth; 0.632cm^3 volume. Character of Wound/Ulcer Post Debridement is improved. Severity of Tissue Post Debridement is: Fat layer exposed. Post procedure Diagnosis Wound #1: Same as Pre-Procedure Pre-procedure diagnosis of Wound #1 is a Venous Leg Ulcer located on the Right,Lateral Lower Leg . There was a Double Layer Compression Therapy Procedure by Shawn Stall, RN. Post procedure Diagnosis Wound #1: Same as Pre-Procedure Wound #2 Pre-procedure diagnosis of Wound #2 is a Venous Leg Ulcer located on the Left,Lateral Lower Leg .Severity of Tissue Pre Debridement is: Fat layer exposed. There was a Excisional Skin/Subcutaneous Tissue Debridement with a total area of 1.81 sq cm performed by Duanne Guess, MD. With the following instrument(s): Curette to remove Viable and Non-Viable tissue/material. Material removed includes Subcutaneous Tissue and Slough and after achieving pain control using Lidocaine 4% T opical Solution. A time out was conducted at 09:50, prior to the start of the procedure. A Minimum amount of bleeding was controlled with Pressure. The procedure was tolerated well with a pain level of 0 throughout and a pain level of 0 following the procedure. Post Debridement Measurements: 2.1cm length x 1.1cm width x 0.1cm depth; 0.181cm^3 volume. Character of Wound/Ulcer Post Debridement is improved. Severity of Tissue Post Debridement is: Fat layer exposed. Post procedure Diagnosis Wound #2: Same as Pre-Procedure Pre-procedure diagnosis of Wound #2 is a Venous Leg Ulcer located on the Left,Lateral Lower Leg . There was a Double Layer Compression Therapy Procedure by Shawn Stall, RN. Post  procedure Diagnosis Wound #2: Same as Pre-Procedure Plan Follow-up Appointments: Return appointment in 3 weeks. - Dr. Lady Gary 03/18/2023 1000 (already scheduled) Nurse Visit: - (already scheduled) 03/04/2023 1015 (already scheduled) 03/11/2023 1015 Anesthetic: (In clinic) Topical Lidocaine 4% applied to wound bed Bathing/ Shower/ Hygiene: May shower with protection but do not get wound dressing(s) wet. Protect dressing(s) with water repellant cover (for example, large plastic bag) or a cast cover and may then take shower. Edema Control - Orders / Instructions: Elevate legs to the level of the heart or above for 30 minutes daily and/or when sitting for 3-4 times a day throughout the day. Avoid standing for long periods of time. Exercise regularly Additional Orders / Instructions: Follow Nutritious Diet - vitamin C 500 mg start once a day and if tolerated then twice a day, zinc 30-50 mg once daily, protein shakes daily WOUND #1: - Lower Leg Wound Laterality: Right, Lateral Cleanser: Soap and Water 1 x Per Week/30 Days Discharge Instructions: May shower and wash wound with dial antibacterial soap and water prior to dressing change. Cleanser: Wound Cleanser 1 x Per Week/30 Days Discharge Instructions: Cleanse the wound with wound cleanser prior to applying a clean dressing using gauze sponges, not tissue or cotton balls. Peri-Wound Care: Sween Lotion (Moisturizing lotion) 1 x Per Week/30 Days Discharge Instructions: Apply moisturizing lotion as directed Topical: Gentamicin 1 x Per Week/30 Days Discharge Instructions: As directed by  physician Topical: Mupirocin Ointment 1 x Per Week/30 Days Discharge Instructions: Apply Mupirocin (Bactroban) as instructed Prim Dressing: Hydrofera Blue Ready Transfer Foam, 2.5x2.5 (in/in) 1 x Per Week/30 Days ary Discharge Instructions: Apply directly to wound bed as directed Secondary Dressing: ABD Pad, 5x9 1 x Per Week/30 Days Discharge Instructions: Apply  over primary dressing as directed. Com pression Wrap: Urgo K2 Lite, (equivalent to a 3 layer) two layer compression system, regular 1 x Per Week/30 Days Discharge Instructions: Apply Urgo K2 Lite as directed (alternative to 3 layer compression). WOUND #2: - Lower Leg Wound Laterality: Left, Lateral Cleanser: Soap and Water 1 x Per Week/30 Days Discharge Instructions: May shower and wash wound with dial antibacterial soap and water prior to dressing change. Cleanser: Wound Cleanser 1 x Per Week/30 Days Discharge Instructions: Cleanse the wound with wound cleanser prior to applying a clean dressing using gauze sponges, not tissue or cotton balls. Peri-Wound Care: Sween Lotion (Moisturizing lotion) 1 x Per Week/30 Days Discharge Instructions: Apply moisturizing lotion as directed Topical: Gentamicin 1 x Per Week/30 Days Discharge Instructions: As directed by physician Topical: Mupirocin Ointment 1 x Per Week/30 Days Discharge Instructions: Apply Mupirocin (Bactroban) as instructed Prim Dressing: Hydrofera Blue Ready Transfer Foam, 2.5x2.5 (in/in) 1 x Per Week/30 Days ary Discharge Instructions: Apply directly to wound bed as directed Secondary Dressing: ABD Pad, 5x9 1 x Per Week/30 Days Discharge Instructions: Apply over primary dressing as directed. Com pression Wrap: Urgo K2 Lite, (equivalent to a 3 layer) two layer compression system, regular 1 x Per Week/30 Days Discharge Instructions: Apply Urgo K2 Lite as directed (alternative to 3 layer compression). MASAJI, VALERIO ([id number]) [id number] Page 10 of 12 02/25/2023: The left lateral leg wound measures slightly longer today. Both wounds have more slough on them this week then on prior occasions. Edema control is good. No erythema, induration, or malodor to suggest infection. I used a curette to debride slough and subcutaneous tissue from both of the wounds. We will continue topical gentamicin and mupirocin, but I  am going to change the contact layer to [facility] to see if this will help clean the wounds of the little bit better and minimize slough accumulation. Continue bilateral Urgo lite compression wraps. Follow-up in 1 week. Electronic Signature(s) Signed: 02/25/2023 10:27:53 AM By: Duanne Guess MD FACS Entered By: Duanne Guess on 02/25/2023 10:27:52 -------------------------------------------------------------------------------- HxROS Details Patient Name: Date of Service: [name], [name] 02/25/2023 9:30 A M Medical Record Number: [id number] Patient Account Number: [id number] Date of Birth/Sex: Treating RN: 27-May-1955 (67 y.o. M) Primary Care Provider: Lynnea Ferrier Other Clinician: Referring Provider: Treating Provider/Extender: [name] in Treatment: 4 Information Obtained From Patient Chart Respiratory Medical History: Positive for: Chronic Obstructive Pulmonary Disease (COPD) Cardiovascular Medical History: Positive for: Angina; Arrhythmia - a-fib; Coronary Artery Disease; Hypertension; Myocardial Infarction; Peripheral Venous Disease Past Medical History Notes: rheumatic heart disease, Hx of artificial heart valve replacement, Mitral insufficiency and aortic stenosis Gastrointestinal Medical History: Past Medical History Notes: hiatal hernia Neurologic Medical History: Past Medical History Notes: stroke Oncologic Medical History: Past Medical History Notes: skin cancer Immunizations Pneumococcal Vaccine: Received Pneumococcal Vaccination: Yes Received Pneumococcal Vaccination On or After 60th Birthday: Yes Implantable Devices None Hospitalization / Surgery History Type of Hospitalization/Surgery Incision and drainage of wound (Right) ROYD, DEWING ([id number]) [id number] Page 11 of 12 Application of wound vac Femoral artery exploration (Right) Lower extremity angiogram  (Right) Femoral-popliteal bypass graft (Right) Abdominal aortogram w/lower extremity (Bilateral) Colonoscopy with propofol Polypectomy  Laparoscopic appendectomy Coronary angioplasty with stent placement Aortic and mitral valve replacement Appendectomy Cardiac valve replacement Family and Social History Cancer: Yes - Mother,Child; Diabetes: Yes - Maternal Grandparents; Heart Disease: Yes - Father; Hereditary Spherocytosis: No; Hypertension: No; Kidney Disease: No; Lung Disease: Yes - Mother; Seizures: No; Stroke: Yes - Father; Thyroid Problems: No; Tuberculosis: No; Current every day smoker - 1/2 ppd; Marital Status - Married; Alcohol Use: Rarely; Drug Use: No History; Caffeine Use: Moderate; Financial Concerns: No; Food, Clothing or Shelter Needs: No; Support System Lacking: No; Transportation Concerns: No Electronic Signature(s) Signed: 02/25/2023 10:29:44 AM By: Duanne Guess MD FACS Entered By: Duanne Guess on 02/25/2023 10:26:34 -------------------------------------------------------------------------------- SuperBill Details Patient Name: Date of Service: [name], [name] 02/25/2023 Medical Record Number: [id number] Patient Account Number: [id number] Date of Birth/Sex: Treating RN: 24-Jan-1956 (67 y.o. M) Primary Care Provider: Lynnea Ferrier Other Clinician: Referring Provider: Treating Provider/Extender: [name] in Treatment: 4 Diagnosis Coding ICD-10 Codes Code Description [id number] Non-pressure chronic ulcer of other part of right lower leg with fat layer exposed L97.822 Non-pressure chronic ulcer of other part of left lower leg with fat layer exposed I87.333 Chronic venous hypertension (idiopathic) with ulcer and inflammation of bilateral lower extremity I73.9 Peripheral vascular disease, unspecified Facility Procedures : CPT4 Code: [id number] Description: 11042 - DEB SUBQ TISSUE 20 SQ CM/< ICD-10 Diagnosis Description L97.812  Non-pressure chronic ulcer of other part of right lower leg with fat layer exp L97.822 Non-pressure chronic ulcer of other part of left lower leg with fat layer expo Modifier: osed sed Quantity: 1 Physician Procedures : CPT4 Code Description Modifier [id number] 99214 - WC PHYS LEVEL 4 - EST PT ICD-10 Diagnosis Description L97.812 Non-pressure chronic ulcer of other part of right lower leg with fat layer exposed L97.822 Non-pressure chronic ulcer of other part of left  lower leg with fat layer exposed I87.333 Chronic venous hypertension (idiopathic) with ulcer and inflammation of bilateral lower extremity I73.9 Peripheral vascular disease, unspecified Quantity: 1 : [id number] 11042 - WC PHYS SUBQ TISS 20 SQ CM ICD-10 Diagnosis Description L97.812 Non-pressure chronic ulcer of other part of right lower leg with fat layer exposed L97.822 Non-pressure chronic ulcer of other part of left lower leg with fat layer exposed  [name] ([id number]) [phone].pdf Pag Quantity: 1 e 12 of 12 Electronic Signature(s) Signed: 02/25/2023 10:28:11 AM By: Duanne Guess MD FACS Entered By: Duanne Guess on 02/25/2023 10:28:11

## 2023-02-25 NOTE — Progress Notes (Signed)
NORTON, CLEMMENS ([id number]) [id number] Page 1 of 11 Visit Report for 02/25/2023 Arrival Information Details Patient Name: Date of Service: [name] 02/25/2023 9:30 A M Medical Record Number: [id number] Patient Account Number: [id number] Date of Birth/Sex: Treating RN: 1955-09-18 (67 y.o. Harlon Flor, Millard.[name] Primary Care Avrian Delfavero: Lynnea Ferrier Other Clinician: Referring Estephani Popper: Treating Massimo Hartland/Extender: [name] in Treatment: 4 Visit Information History Since Last Visit Added or deleted any medications: No Patient Arrived: Ambulatory Any new allergies or adverse reactions: No Arrival Time: 09:53 Had a fall or experienced change in No Accompanied By: wife activities of daily living that may affect Transfer Assistance: None risk of falls: Patient Identification Verified: Yes Signs or symptoms of abuse/neglect since last visito No Secondary Verification Process Completed: Yes Hospitalized since last visit: No Patient Requires Transmission-Based Precautions: No Implantable device outside of the clinic excluding No Patient Has Alerts: No cellular tissue based products placed in the center since last visit: Has Dressing in Place as Prescribed: Yes Has Compression in Place as Prescribed: Yes Pain Present Now: No Electronic Signature(s) Signed: 02/25/2023 1:35:07 PM By: Shawn Stall RN, BSN Entered By: Shawn Stall on 02/25/2023 09:54:08 -------------------------------------------------------------------------------- Compression Therapy Details Patient Name: Date of Service: [name], [name] 02/25/2023 9:30 A M Medical Record Number: [id number] Patient Account Number: [id number] Date of Birth/Sex: Treating RN: Sep 01, 1955 (67 y.o. [name] Primary Care Nina Hoar: Lynnea Ferrier Other Clinician: Referring Jacklynn Dehaas: Treating Kawthar Ennen/Extender: [name] in Treatment:  4 Compression Therapy Performed for Wound Assessment: Wound #1 Right,Lateral Lower Leg Performed By: Clinician Shawn Stall, RN Compression Type: Double Layer Post Procedure Diagnosis Same as Pre-procedure Electronic Signature(s) Signed: 02/25/2023 1:35:07 PM By: Shawn Stall RN, BSN Entered By: Shawn Stall on 02/25/2023 10:04:41 [name] ([id number]) [id number] Page 2 of 11 -------------------------------------------------------------------------------- Compression Therapy Details Patient Name: Date of Service: [name] 02/25/2023 9:30 A M Medical Record Number: [id number] Patient Account Number: [id number] Date of Birth/Sex: Treating RN: [birth year] (67 y.o. [name] Primary Care Goldman Birchall: Lynnea Ferrier Other Clinician: Referring Placido Hangartner: Treating Amena Dockham/Extender: [name] in Treatment: 4 Compression Therapy Performed for Wound Assessment: Wound #2 Left,Lateral Lower Leg Performed By: Clinician Shawn Stall, RN Compression Type: Double Layer Post Procedure Diagnosis Same as Pre-procedure Electronic Signature(s) Signed: 02/25/2023 1:35:07 PM By: Shawn Stall RN, BSN Entered By: Shawn Stall on 02/25/2023 10:04:41 -------------------------------------------------------------------------------- Encounter Discharge Information Details Patient Name: Date of Service: [name], [name] 02/25/2023 9:30 A M Medical Record Number: [id number] Patient Account Number: [id number] Date of Birth/Sex: Treating RN: May 26, 1955 (67 y.o. [name] Primary Care Tyland Klemens: Lynnea Ferrier Other Clinician: Referring Klaira Pesci: Treating Micheline Markes/Extender: [name] in Treatment: 4 Encounter Discharge Information Items Post Procedure Vitals Discharge Condition: Stable Temperature (F): 98.1 Ambulatory Status: Ambulatory Pulse (bpm): 75 Discharge Destination: Home Respiratory  Rate (breaths/min): 20 Transportation: Private Auto Blood Pressure (mmHg): 118/59 Accompanied By: spouse Schedule Follow-up Appointment: Yes Clinical Summary of Care: Electronic Signature(s) Signed: 02/25/2023 1:35:07 PM By: Shawn Stall RN, BSN Entered By: Shawn Stall on 02/25/2023 10:05:31 -------------------------------------------------------------------------------- Lower Extremity Assessment Details Patient Name: Date of Service: [name], [name] 02/25/2023 9:30 A M Medical Record Number: [id number] Patient Account Number: [id number] Date of Birth/Sex: Treating RN: Oct 05, 1955 (67 y.o. [name] Primary Care Arth Nicastro: Lynnea Ferrier Other Clinician: Referring Zarek Relph: Treating Coreon Simkins/Extender: [name] in Treatment: 4 Edema Assessment Assessed: [name]: Yes] [Right: Yes] Edema: [Left:  Yes] [Right: Yes] Calf Gregory, [name] ([id number]) [id number] Page 3 of 11 Left: Right: Point of Measurement: From Medial Instep 35 cm 37 cm Ankle Left: Right: Point of Measurement: From Medial Instep 23 cm 23 cm Vascular Assessment Pulses: Dorsalis Pedis Palpable: [Left:Yes] [Right:Yes] Extremity colors, hair growth, and conditions: Extremity Color: [Left:Hyperpigmented] [Right:Hyperpigmented] Hair Growth on Extremity: [Left:No] [Right:No] Temperature of Extremity: [Left:Cool] [Right:Cool] Capillary Refill: [Left:< 3 seconds] [Right:< 3 seconds] Dependent Rubor: [Left:No] [Right:No] Blanched when Elevated: [Left:No Yes] [Right:No Yes] Toe Nail Assessment Left: Right: Thick: No No Discolored: No No Deformed: No No Improper Length and Hygiene: No No Electronic Signature(s) Signed: 02/25/2023 1:35:07 PM By: Shawn Stall RN, BSN Entered By: Shawn Stall on 02/25/2023 09:55:00 -------------------------------------------------------------------------------- Multi Wound Chart Details Patient Name: Date of  Service: [name], [name] 02/25/2023 9:30 A M Medical Record Number: [id number] Patient Account Number: [id number] Date of Birth/Sex: Treating RN: 08/26/1955 (67 y.o. M) Primary Care Manya Balash: Lynnea Ferrier Other Clinician: Referring Connelly [place]: Treating Ersel Wadleigh/Extender: [name] in Treatment: 4 Vital Signs Height(in): 68 Pulse(bpm): 75 Weight(lbs): 208 Blood Pressure(mmHg): 118/59 Body Mass Index(BMI): 31.6 Temperature(F): 98.1 Respiratory Rate(breaths/min): 20 [1:Photos:] [2:No Photos] [N/A:N/A] Right, Lateral Lower Leg Left, Lateral Lower Leg N/A Wound Location: Gradually Appeared Gradually Appeared N/A Wounding Event: Venous Leg Ulcer Venous Leg Ulcer N/A Primary Etiology: Chronic Obstructive Pulmonary Chronic Obstructive Pulmonary N/A Comorbid History: Disease (COPD), Angina, Arrhythmia, Disease (COPD), Angina, Arrhythmia, Coronary Artery Disease, Coronary Artery Disease, NEHEMIA, LENZI ([id number]) [id number] Page 4 of 11 Hypertension, Myocardial Infarction, Hypertension, Myocardial Infarction, Peripheral Venous Disease Peripheral Venous Disease 10/31/2022 01/13/2023 N/A Date Acquired: 4 4 N/A Weeks of Treatment: Open Open N/A Wound Status: No No N/A Wound Recurrence: 3.5x2.3x0.1 2.1x1.1x0.1 N/A Measurements L x W x D (cm) 6.322 1.814 N/A A (cm) : rea 0.632 0.181 N/A Volume (cm) : 32.90% -61.50% N/A % Reduction in A rea: 32.90% -61.60% N/A % Reduction in Volume: Full Thickness Without Exposed Full Thickness Without Exposed N/A Classification: Support Structures Support Structures Medium Medium N/A Exudate A mount: Serosanguineous Serosanguineous N/A Exudate Type: red, brown red, brown N/A Exudate Color: Distinct, outline attached Distinct, outline attached N/A Wound Margin: Medium (34-66%) Large (67-100%) N/A Granulation A mount: Red, Hyper-granulation Red N/A Granulation Quality: Medium  (34-66%) Small (1-33%) N/A Necrotic A mount: Fat Layer (Subcutaneous Tissue): Yes Fat Layer (Subcutaneous Tissue): Yes N/A Exposed Structures: Fascia: No Fascia: No Tendon: No Tendon: No Muscle: No Muscle: No Joint: No Joint: No Bone: No Bone: No Medium (34-66%) Medium (34-66%) N/A Epithelialization: Debridement - Excisional Debridement - Excisional N/A Debridement: Pre-procedure Verification/Time Out 09:50 09:50 N/A Taken: Lidocaine 4% Topical Solution Lidocaine 4% Topical Solution N/A Pain Control: Necrotic/Eschar, Subcutaneous, Subcutaneous, Slough N/A Tissue Debrided: Slough Skin/Subcutaneous Tissue Skin/Subcutaneous Tissue N/A Level: 6.32 1.81 N/A Debridement A (sq cm): rea Curette Curette N/A Instrument: Minimum Minimum N/A Bleeding: Pressure Pressure N/A Hemostasis Achieved: 0 0 N/A Procedural Pain: 0 0 N/A Post Procedural Pain: Debridement Treatment Response: Procedure was tolerated well Procedure was tolerated well N/A Post Debridement Measurements L x 3.5x2.3x0.1 2.1x1.1x0.1 N/A W x D (cm) 0.632 0.181 N/A Post Debridement Volume: (cm) Excoriation: No Excoriation: No N/A Periwound Skin Texture: Induration: No Induration: No Callus: No Callus: No Crepitus: No Crepitus: No Rash: No Rash: No Scarring: No Scarring: No Maceration: No Maceration: No N/A Periwound Skin Moisture: Dry/Scaly: No Dry/Scaly: No Hemosiderin Staining: Yes Hemosiderin Staining: Yes N/A Periwound Skin Color: Atrophie Blanche: No Atrophie Blanche: No Cyanosis: No Cyanosis: No Ecchymosis: No  Ecchymosis: No Erythema: No Erythema: No Mottled: No Mottled: No Pallor: No Pallor: No Rubor: No Rubor: No No Abnormality No Abnormality N/A Temperature: Compression Therapy Compression Therapy N/A Procedures Performed: Debridement Debridement Treatment Notes Wound #1 (Lower Leg) Wound Laterality: Right, Lateral Cleanser Soap and Water Discharge Instruction: May  shower and wash wound with dial antibacterial soap and water prior to dressing change. Wound Cleanser Discharge Instruction: Cleanse the wound with wound cleanser prior to applying a clean dressing using gauze sponges, not tissue or cotton balls. Peri-Wound Care Sween Lotion (Moisturizing lotion) Discharge Instruction: Apply moisturizing lotion as directed Topical Gentamicin Discharge Instruction: As directed by physician Mupirocin Ointment Discharge Instruction: Apply Mupirocin (Bactroban) as instructed HASTON, BOSSHART ([id number]) [id number] Page 5 of 11 Primary Dressing Hydrofera Blue Ready Transfer Foam, 2.5x2.5 (in/in) Discharge Instruction: Apply directly to wound bed as directed Secondary Dressing ABD Pad, 5x9 Discharge Instruction: Apply over primary dressing as directed. Secured With Compression Wrap Urgo K2 Lite, (equivalent to a 3 layer) two layer compression system, regular Discharge Instruction: Apply Urgo K2 Lite as directed (alternative to 3 layer compression). Compression Stockings Add-Ons Wound #2 (Lower Leg) Wound Laterality: Left, Lateral Cleanser Soap and Water Discharge Instruction: May shower and wash wound with dial antibacterial soap and water prior to dressing change. Wound Cleanser Discharge Instruction: Cleanse the wound with wound cleanser prior to applying a clean dressing using gauze sponges, not tissue or cotton balls. Peri-Wound Care Sween Lotion (Moisturizing lotion) Discharge Instruction: Apply moisturizing lotion as directed Topical Gentamicin Discharge Instruction: As directed by physician Mupirocin Ointment Discharge Instruction: Apply Mupirocin (Bactroban) as instructed Primary Dressing Hydrofera Blue Ready Transfer Foam, 2.5x2.5 (in/in) Discharge Instruction: Apply directly to wound bed as directed Secondary Dressing ABD Pad, 5x9 Discharge Instruction: Apply over primary dressing as directed. Secured  With Compression Wrap Urgo K2 Lite, (equivalent to a 3 layer) two layer compression system, regular Discharge Instruction: Apply Urgo K2 Lite as directed (alternative to 3 layer compression). Compression Stockings Add-Ons Electronic Signature(s) Signed: 02/25/2023 10:24:45 AM By: Duanne Guess MD FACS Entered By: Duanne Guess on 02/25/2023 10:24:45 -------------------------------------------------------------------------------- Multi-Disciplinary Care Plan Details Patient Name: Date of Service: [name], [name] 02/25/2023 9:30 A M Medical Record Number: [id number] Patient Account Number: [id number] Date of Birth/Sex: Treating RN: 04-24-1955 (67 y.o. [name] Primary Care Deeann Servidio: Lynnea Ferrier Other Clinician: [name] ([id number]) [id number] Page 6 of 11 Referring Jasani Dolney: Treating Vashti Bolanos/Extender: [name] in Treatment: 4 Multidisciplinary Care Plan reviewed with physician Active Inactive Venous Leg Ulcer Nursing Diagnoses: Actual venous Insuffiency (use after diagnosis is confirmed) Knowledge deficit related to disease process and management Goals: Patient will maintain optimal edema control Date Initiated: 01/23/2023 Target Resolution Date: 03/07/2023 Goal Status: Active Patient/caregiver will verbalize understanding of disease process and disease management Date Initiated: 01/23/2023 Target Resolution Date: 03/07/2023 Goal Status: Active Interventions: Assess peripheral edema status every visit. Compression as ordered Provide education on venous insufficiency Treatment Activities: Therapeutic compression applied : 01/23/2023 Notes: Wound/Skin Impairment Nursing Diagnoses: Impaired tissue integrity Knowledge deficit related to ulceration/compromised skin integrity Goals: Patient/caregiver will verbalize understanding of skin care regimen Date Initiated: 01/23/2023 Target Resolution  Date: 03/07/2023 Goal Status: Active Interventions: Assess ulceration(s) every visit Treatment Activities: Skin care regimen initiated : 01/23/2023 Topical wound management initiated : 01/23/2023 Notes: Electronic Signature(s) Signed: 02/25/2023 1:35:07 PM By: Shawn Stall RN, BSN Entered By: Shawn Stall on 02/25/2023 09:28:31 -------------------------------------------------------------------------------- Pain Assessment Details Patient Name: Date of Service: [name], [name] 02/25/2023 9:30 A  M Medical Record Number: [id number] Patient Account Number: [id number] Date of Birth/Sex: Treating RN: 05-30-1955 (67 y.o. [name] Primary Care Tanasha Menees: Lynnea Ferrier Other Clinician: Referring Iriel Nason: Treating Kittie Krizan/Extender: [name] in Treatment: [address] [place], Molly Maduro ([id number]) [id number] Page 7 of 11 Location of Pain Severity and Description of Pain Patient Has Paino No Site Locations Pain Management and Medication Current Pain Management: Electronic Signature(s) Signed: 02/25/2023 1:35:07 PM By: Shawn Stall RN, BSN Entered By: Shawn Stall on 02/25/2023 09:54:33 -------------------------------------------------------------------------------- Patient/Caregiver Education Details Patient Name: Date of Service: [name], [name] 11/26/2024andnbsp9:30 A M Medical Record Number: [id number] Patient Account Number: [id number] Date of Birth/Gender: Treating RN: [birth year] (67 y.o. [name] Primary Care Physician: Lynnea Ferrier Other Clinician: Referring Physician: Treating Physician/Extender: [name] in Treatment: 4 Education Assessment Education Provided To: Patient Education Topics Provided Wound/Skin Impairment: Handouts: Caring for Your Ulcer Methods: Explain/Verbal Responses: Reinforcements needed Electronic Signature(s) Signed: 02/25/2023  1:35:07 PM By: Shawn Stall RN, BSN Entered By: Shawn Stall on 02/25/2023 09:28:42 Wound Assessment Details -------------------------------------------------------------------------------- [name] ([id number]) [id number] Page 8 of 11 Patient Name: Date of Service: [name] 02/25/2023 9:30 A M Medical Record Number: [id number] Patient Account Number: [id number] Date of Birth/Sex: Treating RN: 1956-03-21 (67 y.o. Harlon Flor, Millard.[name] Primary Care Marsa Matteo: Lynnea Ferrier Other Clinician: Referring Jamelia Varano: Treating Jamarii Banks/Extender: [name] in Treatment: 4 Wound Status Wound Number: 1 Primary Venous Leg Ulcer Etiology: Wound Location: Right, Lateral Lower Leg Wound Open Wounding Event: Gradually Appeared Status: Date Acquired: 10/31/2022 Comorbid Chronic Obstructive Pulmonary Disease (COPD), Angina, Weeks Of Treatment: 4 History: Arrhythmia, Coronary Artery Disease, Hypertension, Myocardial Clustered Wound: No Infarction, Peripheral Venous Disease Photos Wound Measurements Length: (cm) 3.5 % Reduction in Area: 32.9% Width: (cm) 2.3 % Reduction in Volume: 32.9% Depth: (cm) 0.1 Epithelialization: Medium (34-66%) Area: (cm) 6.322 Tunneling: No Volume: (cm) 0.632 Undermining: No Wound Description Classification: Full Thickness Without Exposed Support Structures Foul Odor After Cleansing: No Wound Margin: Distinct, outline attached Slough/Fibrino Yes Exudate Amount: Medium Exudate Type: Serosanguineous Exudate Color: red, brown Wound Bed Granulation Amount: Medium (34-66%) Exposed Structure Granulation Quality: Red, Hyper-granulation Fascia Exposed: No Necrotic Amount: Medium (34-66%) Fat Layer (Subcutaneous Tissue) Exposed: Yes Necrotic Quality: Adherent Slough Tendon Exposed: No Muscle Exposed: No Joint Exposed: No Bone Exposed: No Periwound Skin Texture Texture Color No Abnormalities Noted:  Yes No Abnormalities Noted: No Atrophie Blanche: No Moisture Cyanosis: No No Abnormalities Noted: Yes Ecchymosis: No Erythema: No Hemosiderin Staining: Yes Mottled: No Pallor: No Rubor: No Temperature / Pain Temperature: No Abnormality Treatment Notes Wound #1 (Lower Leg) Wound Laterality: Right, Lateral Cleanser Soap and Water Discharge Instruction: May shower and wash wound with dial antibacterial soap and water prior to dressing change. CLEATUS, NITTA ([id number]) [id number] Page 9 of 11 Wound Cleanser Discharge Instruction: Cleanse the wound with wound cleanser prior to applying a clean dressing using gauze sponges, not tissue or cotton balls. Peri-Wound Care Sween Lotion (Moisturizing lotion) Discharge Instruction: Apply moisturizing lotion as directed Topical Gentamicin Discharge Instruction: As directed by physician Mupirocin Ointment Discharge Instruction: Apply Mupirocin (Bactroban) as instructed Primary Dressing Hydrofera Blue Ready Transfer Foam, 2.5x2.5 (in/in) Discharge Instruction: Apply directly to wound bed as directed Secondary Dressing ABD Pad, 5x9 Discharge Instruction: Apply over primary dressing as directed. Secured With Compression Wrap Urgo K2 Lite, (equivalent to a 3 layer) two layer compression system, regular Discharge Instruction: Apply Urgo K2 Lite as directed (alternative to 3  layer compression). Compression Stockings Add-Ons Electronic Signature(s) Signed: 02/25/2023 1:35:07 PM By: Shawn Stall RN, BSN Entered By: Shawn Stall on 02/25/2023 09:56:44 -------------------------------------------------------------------------------- Wound Assessment Details Patient Name: Date of Service: [name], [name] 02/25/2023 9:30 A M Medical Record Number: [id number] Patient Account Number: [id number] Date of Birth/Sex: Treating RN: [birth year] (67 y.o. Harlon Flor, Millard.[name] Primary Care Maysen Bonsignore: Lynnea Ferrier Other  Clinician: Referring Riva Sesma: Treating Lauralee Waters/Extender: [name] in Treatment: 4 Wound Status Wound Number: 2 Primary Venous Leg Ulcer Etiology: Wound Location: Left, Lateral Lower Leg Wound Open Wounding Event: Gradually Appeared Status: Date Acquired: 01/13/2023 Comorbid Chronic Obstructive Pulmonary Disease (COPD), Angina, Weeks Of Treatment: 4 History: Arrhythmia, Coronary Artery Disease, Hypertension, Myocardial Clustered Wound: No Infarction, Peripheral Venous Disease Wound Measurements Length: (cm) 2.1 Width: (cm) 1.1 Depth: (cm) 0.1 Area: (cm) 1.814 Volume: (cm) 0.181 % Reduction in Area: -61.5% % Reduction in Volume: -61.6% Epithelialization: Medium (34-66%) Tunneling: No Undermining: No Wound Description Classification: Full Thickness Without Exposed Support Structures Wound Margin: Distinct, outline attached Exudate Amount: Medium Exudate Type: Serosanguineous Exudate Color: red, brown ORELL, PALOMARES ([id number]) Foul Odor After Cleansing: No Slough/Fibrino Yes [id number] Page 10 of 11 Wound Bed Granulation Amount: Large (67-100%) Exposed Structure Granulation Quality: Red Fascia Exposed: No Necrotic Amount: Small (1-33%) Fat Layer (Subcutaneous Tissue) Exposed: Yes Necrotic Quality: Adherent Slough Tendon Exposed: No Muscle Exposed: No Joint Exposed: No Bone Exposed: No Periwound Skin Texture Texture Color No Abnormalities Noted: Yes No Abnormalities Noted: No Atrophie Blanche: No Moisture Cyanosis: No No Abnormalities Noted: Yes Ecchymosis: No Erythema: No Hemosiderin Staining: Yes Mottled: No Pallor: No Rubor: No Temperature / Pain Temperature: No Abnormality Treatment Notes Wound #2 (Lower Leg) Wound Laterality: Left, Lateral Cleanser Soap and Water Discharge Instruction: May shower and wash wound with dial antibacterial soap and water prior to dressing change. Wound  Cleanser Discharge Instruction: Cleanse the wound with wound cleanser prior to applying a clean dressing using gauze sponges, not tissue or cotton balls. Peri-Wound Care Sween Lotion (Moisturizing lotion) Discharge Instruction: Apply moisturizing lotion as directed Topical Gentamicin Discharge Instruction: As directed by physician Mupirocin Ointment Discharge Instruction: Apply Mupirocin (Bactroban) as instructed Primary Dressing Hydrofera Blue Ready Transfer Foam, 2.5x2.5 (in/in) Discharge Instruction: Apply directly to wound bed as directed Secondary Dressing ABD Pad, 5x9 Discharge Instruction: Apply over primary dressing as directed. Secured With Compression Wrap Urgo K2 Lite, (equivalent to a 3 layer) two layer compression system, regular Discharge Instruction: Apply Urgo K2 Lite as directed (alternative to 3 layer compression). Compression Stockings Add-Ons Electronic Signature(s) Signed: 02/25/2023 1:35:07 PM By: Shawn Stall RN, BSN Entered By: Shawn Stall on 02/25/2023 09:55:58 [name] ([id number]) [id number] Page 11 of 11 -------------------------------------------------------------------------------- Vitals Details Patient Name: Date of Service: [name] 02/25/2023 9:30 A M Medical Record Number: [id number] Patient Account Number: [id number] Date of Birth/Sex: Treating RN: 09/10/1955 (67 y.o. [name] Primary Care Xian Apostol: Lynnea Ferrier Other Clinician: Referring Lexani Corona: Treating Khalon Cansler/Extender: [name] in Treatment: 4 Vital Signs Time Taken: 09:40 Temperature (F): 98.1 Height (in): 68 Pulse (bpm): 75 Weight (lbs): 208 Respiratory Rate (breaths/min): 20 Body Mass Index (BMI): 31.6 Blood Pressure (mmHg): 118/59 Reference Range: 80 - 120 mg / dl Electronic Signature(s) Signed: 02/25/2023 1:35:07 PM By: Shawn Stall RN, BSN Entered By: Shawn Stall on 02/25/2023  09:54:26

## 2023-03-04 ENCOUNTER — Encounter (HOSPITAL_BASED_OUTPATIENT_CLINIC_OR_DEPARTMENT_OTHER): Payer: Medicare Other | Attending: Internal Medicine | Admitting: Internal Medicine

## 2023-03-04 DIAGNOSIS — L97822 Non-pressure chronic ulcer of other part of left lower leg with fat layer exposed: Secondary | ICD-10-CM | POA: Insufficient documentation

## 2023-03-04 DIAGNOSIS — I10 Essential (primary) hypertension: Secondary | ICD-10-CM | POA: Insufficient documentation

## 2023-03-04 DIAGNOSIS — F17200 Nicotine dependence, unspecified, uncomplicated: Secondary | ICD-10-CM | POA: Diagnosis not present

## 2023-03-04 DIAGNOSIS — I739 Peripheral vascular disease, unspecified: Secondary | ICD-10-CM | POA: Insufficient documentation

## 2023-03-04 DIAGNOSIS — J449 Chronic obstructive pulmonary disease, unspecified: Secondary | ICD-10-CM | POA: Diagnosis not present

## 2023-03-04 DIAGNOSIS — I87333 Chronic venous hypertension (idiopathic) with ulcer and inflammation of bilateral lower extremity: Secondary | ICD-10-CM | POA: Insufficient documentation

## 2023-03-04 DIAGNOSIS — I2510 Atherosclerotic heart disease of native coronary artery without angina pectoris: Secondary | ICD-10-CM | POA: Diagnosis not present

## 2023-03-04 DIAGNOSIS — I872 Venous insufficiency (chronic) (peripheral): Secondary | ICD-10-CM | POA: Diagnosis not present

## 2023-03-04 DIAGNOSIS — L97812 Non-pressure chronic ulcer of other part of right lower leg with fat layer exposed: Secondary | ICD-10-CM | POA: Insufficient documentation

## 2023-03-10 NOTE — Progress Notes (Signed)
[name], [name] ([id number]) [id number] Page 1 of 5 Visit Report for 03/04/2023 Arrival Information Details Patient Name: Date of Service: [name] [name] 03/04/2023 10:15 A M Medical Record Number: [id number] Patient Account Number: [id number] Date of Birth/Sex: Treating RN: June 27, 1955 (67 y.o. [name] Primary Care [name]: [name] [name]: Referring [name]: Treating [name]/Extender: [name] in Treatment: 5 Visit Information History Since Last Visit Added or deleted any medications: No Patient Arrived: Ambulatory Any new allergies or adverse reactions: No Arrival Time: 15:56 Had a fall or experienced change in No Accompanied By: wife activities of daily living that may affect Transfer Assistance: None risk of falls: Patient Identification Verified: Yes Signs or symptoms of abuse/neglect since last visito No Secondary Verification Process Completed: Yes Hospitalized since last visit: No Patient Requires Transmission-Based Precautions: No Implantable device outside of the clinic excluding No Patient Has Alerts: No cellular tissue based products placed in the center since last visit: Has Dressing in Place as Prescribed: Yes Has Compression in Place as Prescribed: Yes Pain Present Now: No Electronic Signature(s) Signed: 03/10/2023 4:33:32 PM By: Gregory Pulling RN, BSN Entered By: [name] on 03/04/2023 15:57:07 -------------------------------------------------------------------------------- Compression Therapy Details Patient Name: Date of Service: [name], RO [name] 03/04/2023 10:15 A M Medical Record Number: [id number] Patient Account Number: [id number] Date of Birth/Sex: Treating RN: 06-05-1955 (67 y.o. [name] Primary Care [name]: [name] [name]: Referring [name]: Treating [name]/Extender: [name] in Treatment:  5 Compression Therapy Performed for Wound Assessment: Wound #2 Left,Lateral Lower Leg Performed By: [name] Gregory Pulling, RN Compression Type: Three Layer Electronic Signature(s) Signed: 03/10/2023 4:33:32 PM By: Gregory Pulling RN, BSN Entered By: [name] on 03/04/2023 16:00:12 -------------------------------------------------------------------------------- Compression Therapy Details Patient Name: Date of Service: [name], RO [name] 03/04/2023 10:15 A M Medical Record Number: [id number] Patient Account Number: [id number] [name], [name] ([id number]) [id number] Page 2 of 5 Date of Birth/Sex: Treating RN: January 01, 1956 (67 y.o. [name] Primary Care [name]: [name]: [name] Referring [name]: Treating [name]/Extender: [name] in Treatment: 5 Compression Therapy Performed for Wound Assessment: Wound #1 Right,Lateral Lower Leg Performed By: [name] Gregory Pulling, RN Compression Type: Three Emergency planning/management officer) Signed: 03/10/2023 4:33:32 PM By: Gregory Pulling RN, BSN Entered By: [name] on 03/04/2023 16:00:20 -------------------------------------------------------------------------------- Encounter Discharge Information Details Patient Name: Date of Service: [name], RO [name] 03/04/2023 10:15 A M Medical Record Number: [id number] Patient Account Number: [id number] Date of Birth/Sex: Treating RN: 04-30-1955 (67 y.o. [name] Primary Care [name]: [name] [name]: Referring [name]: Treating [name]/Extender: [name] in Treatment: 5 Encounter Discharge Information Items Discharge Condition: Stable Ambulatory Status: Ambulatory Discharge Destination: Home Transportation: Private Auto Accompanied By: wife Schedule Follow-up Appointment: Yes Clinical Summary of Care: Patient Declined Electronic Signature(s) Signed:  03/10/2023 4:33:32 PM By: Gregory Pulling RN, BSN Entered By: [name] on 03/04/2023 16:01:46 -------------------------------------------------------------------------------- Patient/Caregiver Education Details Patient Name: Date of Service: [name], RO [name] 12/3/2024andnbsp10:15 A M Medical Record Number: [id number] Patient Account Number: [id number] Date of Birth/Gender: Treating RN: 06-02-1955 (67 y.o. [name] Primary Care Physician: [name] [name]: Referring Physician: Treating Physician/Extender: [name] in Treatment: 5 Education Assessment Education Provided To: Patient Education Topics Provided Wound/Skin Impairment: Methods: Explain/Verbal Responses: State content correctly Electronic Signature(s) [name], [name] ([id number]) [id number] Page 3 of 5 Signed: 03/10/2023 4:33:32 PM By: Gregory Pulling RN, BSN Entered By:  [name] on 03/04/2023 16:01:17 -------------------------------------------------------------------------------- Wound Assessment Details Patient Name: Date of Service: [name] [name] 03/04/2023 10:15 A M Medical Record Number: [id number] Patient Account Number: [id number] Date of Birth/Sex: Treating RN: April 24, 1955 (67 y.o. [name] Primary Care [name]: [name] [name]: Referring Latanya Hemmer: Treating [name]/Extender: [name] in Treatment: 5 Wound Status Wound Number: 1 Primary Venous Leg Ulcer Etiology: Wound Location: Right, Lateral Lower Leg Wound Open Wounding Event: Gradually Appeared Status: Date Acquired: 10/31/2022 Comorbid Chronic Obstructive Pulmonary Disease (COPD), Angina, Weeks Of Treatment: 5 History: Arrhythmia, Coronary Artery Disease, Hypertension, Myocardial Clustered Wound: No Infarction, Peripheral Venous Disease Wound Measurements Length: (cm) 3.5 Width: (cm) 2.3 Depth: (cm)  0.1 Area: (cm) 6.322 Volume: (cm) 0.632 % Reduction in Area: 32.9% % Reduction in Volume: 32.9% Epithelialization: Medium (34-66%) Wound Description Classification: Full Thickness Without Exposed Support Wound Margin: Distinct, outline attached Exudate Amount: Medium Exudate Type: Serosanguineous Exudate Color: red, brown Structures Foul Odor After Cleansing: No Slough/Fibrino Yes Wound Bed Granulation Amount: Medium (34-66%) Exposed Structure Granulation Quality: Red, Hyper-granulation Fascia Exposed: No Necrotic Amount: Medium (34-66%) Fat Layer (Subcutaneous Tissue) Exposed: Yes Necrotic Quality: Adherent Slough Tendon Exposed: No Muscle Exposed: No Joint Exposed: No Bone Exposed: No Periwound Skin Texture Texture Color No Abnormalities Noted: Yes No Abnormalities Noted: No Atrophie Blanche: No Moisture Cyanosis: No No Abnormalities Noted: Yes Ecchymosis: No Erythema: No Hemosiderin Staining: Yes Mottled: No Pallor: No Rubor: No Temperature / Pain Temperature: No Abnormality Treatment Notes Wound #1 (Lower Leg) Wound Laterality: Right, Lateral Cleanser Soap and Water Discharge Instruction: May shower and wash wound with dial antibacterial soap and water prior to dressing change. Wound Cleanser [name], [name] ([id number]) [id number] Page 4 of 5 Discharge Instruction: Cleanse the wound with wound cleanser prior to applying a clean dressing using gauze sponges, not tissue or cotton balls. Peri-Wound Care Sween Lotion (Moisturizing lotion) Discharge Instruction: Apply moisturizing lotion as directed Topical Gentamicin Discharge Instruction: As directed by physician Mupirocin Ointment Discharge Instruction: Apply Mupirocin (Bactroban) as instructed Primary Dressing Hydrofera Blue Ready Transfer Foam, 2.5x2.5 (in/in) Discharge Instruction: Apply directly to wound bed as directed Secondary Dressing ABD Pad, 5x9 Discharge  Instruction: Apply over primary dressing as directed. Secured With Compression Wrap Urgo K2 Lite, (equivalent to a 3 layer) two layer compression system, regular Discharge Instruction: Apply Urgo K2 Lite as directed (alternative to 3 layer compression). Compression Stockings Add-Ons Electronic Signature(s) Signed: 03/10/2023 4:33:32 PM By: Gregory Pulling RN, BSN Entered By: [name] on 03/04/2023 15:59:12 -------------------------------------------------------------------------------- Wound Assessment Details Patient Name: Date of Service: [name], RO [name] 03/04/2023 10:15 A M Medical Record Number: [id number] Patient Account Number: [id number] Date of Birth/Sex: Treating RN: Mar 16, 1956 (67 y.o. [name] Primary Care Sally-Anne Wamble: [name] [name]: Referring Gabriela Irigoyen: Treating Alicyn Klann/Extender: [name] in Treatment: 5 Wound Status Wound Number: 2 Primary Venous Leg Ulcer Etiology: Wound Location: Left, Lateral Lower Leg Wound Open Wounding Event: Gradually Appeared Status: Date Acquired: 01/13/2023 Comorbid Chronic Obstructive Pulmonary Disease (COPD), Angina, Weeks Of Treatment: 5 History: Arrhythmia, Coronary Artery Disease, Hypertension, Myocardial Clustered Wound: No Infarction, Peripheral Venous Disease Wound Measurements Length: (cm) 2.1 Width: (cm) 1.1 Depth: (cm) 0.1 Area: (cm) 1.814 Volume: (cm) 0.181 % Reduction in Area: -61.5% % Reduction in Volume: -61.6% Epithelialization: Medium (34-66%) Wound Description Classification: Full Thickness Without Exposed Support Structures Wound Margin: Distinct, outline attached Exudate Amount: Medium Exudate Type: Serosanguineous Exudate Color: red, brown Hesser, Tetsuo ([id number]) Wound Bed Granulation  Amount: Large (67-100%) Granulation Quality: Red Necrotic Amount: Small (1-33%) Necrotic Quality: Adherent Slough Foul Odor After Cleansing: No Slough/Fibrino  Yes [id number] Page 5 of 5 Exposed Structure Fascia Exposed: No Fat Layer (Subcutaneous Tissue) Exposed: Yes Tendon Exposed: No Muscle Exposed: No Joint Exposed: No Bone Exposed: No Periwound Skin Texture Texture Color No Abnormalities Noted: Yes No Abnormalities Noted: No Atrophie Blanche: No Moisture Cyanosis: No No Abnormalities Noted: Yes Ecchymosis: No Erythema: No Hemosiderin Staining: Yes Mottled: No Pallor: No Rubor: No Temperature / Pain Temperature: No Abnormality Treatment Notes Wound #2 (Lower Leg) Wound Laterality: Left, Lateral Cleanser Soap and Water Discharge Instruction: May shower and wash wound with dial antibacterial soap and water prior to dressing change. Wound Cleanser Discharge Instruction: Cleanse the wound with wound cleanser prior to applying a clean dressing using gauze sponges, not tissue or cotton balls. Peri-Wound Care Sween Lotion (Moisturizing lotion) Discharge Instruction: Apply moisturizing lotion as directed Topical Gentamicin Discharge Instruction: As directed by physician Mupirocin Ointment Discharge Instruction: Apply Mupirocin (Bactroban) as instructed Primary Dressing Hydrofera Blue Ready Transfer Foam, 2.5x2.5 (in/in) Discharge Instruction: Apply directly to wound bed as directed Secondary Dressing ABD Pad, 5x9 Discharge Instruction: Apply over primary dressing as directed. Secured With Compression Wrap Urgo K2 Lite, (equivalent to a 3 layer) two layer compression system, regular Discharge Instruction: Apply Urgo K2 Lite as directed (alternative to 3 layer compression). Compression Stockings Add-Ons Electronic Signature(s) Signed: 03/10/2023 4:33:32 PM By: Gregory Pulling RN, BSN Entered By: [name] on 03/04/2023 15:59:29

## 2023-03-10 NOTE — Progress Notes (Signed)
DVANTE, ROGIER ([id number]) [id number] Page 1 of 1 Visit Report for 03/04/2023 SuperBill Details Patient Name: Date of Service: [name] 03/04/2023 Medical Record Number: [id number] Patient Account Number: [id number] Date of Birth/Sex: Treating RN: [birth year] (67 y.o. Cline Cools Primary Care Provider: Lynnea Ferrier Other Clinician: Referring Provider: Treating Provider/Extender: Princella Pellegrini in Treatment: 5 Diagnosis Coding ICD-10 Codes Code Description [phone] Non-pressure chronic ulcer of other part of right lower leg with fat layer exposed L97.822 Non-pressure chronic ulcer of other part of left lower leg with fat layer exposed I87.333 Chronic venous hypertension (idiopathic) with ulcer and inflammation of bilateral lower extremity I73.9 Peripheral vascular disease, unspecified Facility Procedures CPT4 Description Modifier Quantity Code [id number] [phone] BILATERAL: Application of multi-layer venous compression system; leg (below knee), including ankle and 1 foot. Electronic Signature(s) Signed: 03/09/2023 9:47:40 AM By: Baltazar Najjar MD Signed: 03/10/2023 4:33:32 PM By: Redmond Pulling RN, BSN Entered By: Redmond Pulling on 03/04/2023 16:02:29

## 2023-03-11 ENCOUNTER — Encounter (HOSPITAL_BASED_OUTPATIENT_CLINIC_OR_DEPARTMENT_OTHER): Payer: Medicare Other | Admitting: Internal Medicine

## 2023-03-11 DIAGNOSIS — I87333 Chronic venous hypertension (idiopathic) with ulcer and inflammation of bilateral lower extremity: Secondary | ICD-10-CM | POA: Diagnosis not present

## 2023-03-11 NOTE — Progress Notes (Signed)
[name], [name] ([id number]) [name] Page 1 of 5 Visit Report for 03/11/2023 Arrival Information Details Patient Name: Date of Service: [name] 03/11/2023 10:15 A M Medical Record Number: [id number] Patient Account Number: [id number] Date of Birth/Sex: Treating RN: 22-Dec-1955 (67 y.o. M) Primary Care Euriah Matlack: Lynnea Ferrier Other Clinician: Thayer Dallas Referring Evamarie Raetz: Treating Lorina Duffner/Extender: Princella Pellegrini in Treatment: 6 Visit Information History Since Last Visit Added or deleted any medications: No Patient Arrived: Ambulatory Any new allergies or adverse reactions: No Arrival Time: 10:28 Had a fall or experienced change in No Accompanied By: self activities of daily living that may affect Transfer Assistance: None risk of falls: Patient Identification Verified: Yes Signs or symptoms of abuse/neglect since last visito No Secondary Verification Process Completed: Yes Hospitalized since last visit: No Patient Requires Transmission-Based Precautions: No Has Dressing in Place as Prescribed: Yes Patient Has Alerts: No Has Compression in Place as Prescribed: Yes Pain Present Now: No Electronic Signature(s) Signed: 03/11/2023 4:21:45 PM By: Thayer Dallas Entered By: Thayer Dallas on 03/11/2023 10:28:56 -------------------------------------------------------------------------------- Compression Therapy Details Patient Name: Date of Service: [name], [name] 03/11/2023 10:15 A M Medical Record Number: [id number] Patient Account Number: [id number] Date of Birth/Sex: Treating RN: 08-04-1955 (67 y.o. M) Primary Care Baraa Tubbs: Lynnea Ferrier Other Clinician: Referring Mersadez Linden: Treating Delon Revelo/Extender: Princella Pellegrini in Treatment: 6 Compression Therapy Performed for Wound Assessment: Wound #1 Right,Lateral Lower Leg Performed By: Clinician Thayer Dallas, Compression Type: Double  Layer Electronic Signature(s) Signed: 03/11/2023 4:21:45 PM By: Thayer Dallas Entered By: Thayer Dallas on 03/11/2023 12:05:13 -------------------------------------------------------------------------------- Compression Therapy Details Patient Name: Date of Service: [name], [name] 03/11/2023 10:15 A M Medical Record Number: [id number] Patient Account Number: [id number] Date of Birth/Sex: Treating RN: 01-Apr-1956 (67 y.o. M) Primary Care Tashiya Souders: Lynnea Ferrier Other Clinician: [name] ([id number]) [name] Page 2 of 5 Referring Breylin Dom: Treating Daziya Redmond/Extender: Princella Pellegrini in Treatment: 6 Compression Therapy Performed for Wound Assessment: Wound #2 Left,Lateral Lower Leg Performed By: Clinician Thayer Dallas, Compression Type: Double Layer Electronic Signature(s) Signed: 03/11/2023 4:21:45 PM By: Thayer Dallas Entered By: Thayer Dallas on 03/11/2023 12:05:13 -------------------------------------------------------------------------------- Encounter Discharge Information Details Patient Name: Date of Service: [name], [name] 03/11/2023 10:15 A M Medical Record Number: [id number] Patient Account Number: [id number] Date of Birth/Sex: Treating RN: [birth year] (67 y.o. M) Primary Care Avalene Sealy: Lynnea Ferrier Other Clinician: Thayer Dallas Referring Keyandre Pileggi: Treating Stana Bayon/Extender: Princella Pellegrini in Treatment: 6 Encounter Discharge Information Items Discharge Condition: Stable Ambulatory Status: Ambulatory Discharge Destination: Home Transportation: Private Auto Accompanied By: self Schedule Follow-up Appointment: Yes Clinical Summary of Care: Electronic Signature(s) Signed: 03/11/2023 4:21:45 PM By: Thayer Dallas Entered By: Thayer Dallas on 03/11/2023 12:05:48 -------------------------------------------------------------------------------- Patient/Caregiver Education  Details Patient Name: Date of Service: [name], [name] 12/10/2024andnbsp10:15 A M Medical Record Number: [id number] Patient Account Number: [id number] Date of Birth/Gender: Treating RN: 1955/04/18 (67 y.o. M) Primary Care Physician: Lynnea Ferrier Other Clinician: Thayer Dallas Referring Physician: Treating Physician/Extender: Princella Pellegrini in Treatment: 6 Education Assessment Education Provided To: Patient Education Topics Provided Electronic Signature(s) Signed: 03/11/2023 4:21:45 PM By: Thayer Dallas Entered By: Thayer Dallas on 03/11/2023 12:05:32 [name] ([id number]) [id number] Page 3 of 5 -------------------------------------------------------------------------------- Wound Assessment Details Patient Name: Date of Service: [name] 03/11/2023 10:15 A M Medical Record Number: [id number] Patient Account Number: [id number] Date of Birth/Sex: Treating RN: January 12, 1956 (67 y.o. M) Primary Care Nyellie Yetter: Lynnea Ferrier  Other Clinician: Referring Mikaelah Trostle: Treating Melinda Pottinger/Extender: Princella Pellegrini in Treatment: 6 Wound Status Wound Number: 1 Primary Etiology: Venous Leg Ulcer Wound Location: Right, Lateral Lower Leg Wound Status: Open Wounding Event: Gradually Appeared Date Acquired: 10/31/2022 Weeks Of Treatment: 6 Clustered Wound: No Wound Measurements Length: (cm) 3.5 Width: (cm) 2.3 Depth: (cm) 0.1 Area: (cm) 6.322 Volume: (cm) 0.632 % Reduction in Area: 32.9% % Reduction in Volume: 32.9% Wound Description Classification: Full Thickness Without Exposed Support Exudate Amount: Medium Exudate Type: Serosanguineous Exudate Color: red, brown Structures Periwound Skin Texture Texture Color No Abnormalities Noted: No No Abnormalities Noted: No Moisture No Abnormalities Noted: No Treatment Notes Wound #1 (Lower Leg) Wound Laterality: Right, Lateral Cleanser Soap and  Water Discharge Instruction: May shower and wash wound with dial antibacterial soap and water prior to dressing change. Wound Cleanser Discharge Instruction: Cleanse the wound with wound cleanser prior to applying a clean dressing using gauze sponges, not tissue or cotton balls. Peri-Wound Care Sween Lotion (Moisturizing lotion) Discharge Instruction: Apply moisturizing lotion as directed Topical Gentamicin Discharge Instruction: As directed by physician Mupirocin Ointment Discharge Instruction: Apply Mupirocin (Bactroban) as instructed Primary Dressing Hydrofera Blue Ready Transfer Foam, 2.5x2.5 (in/in) Discharge Instruction: Apply directly to wound bed as directed Secondary Dressing ABD Pad, 5x9 Discharge Instruction: Apply over primary dressing as directed. Secured With [place], Molly Maduro ([id number]) [name] Page 4 of 5 Compression Wrap Urgo K2 Lite, (equivalent to a 3 layer) two layer compression system, regular Discharge Instruction: Apply Urgo K2 Lite as directed (alternative to 3 layer compression). Compression Stockings Add-Ons Electronic Signature(s) Signed: 03/11/2023 4:21:45 PM By: Thayer Dallas Entered By: Thayer Dallas on 03/11/2023 10:29:16 -------------------------------------------------------------------------------- Wound Assessment Details Patient Name: Date of Service: [name], [name] 03/11/2023 10:15 A M Medical Record Number: [id number] Patient Account Number: [id number] Date of Birth/Sex: Treating RN: 1956/01/13 (67 y.o. M) Primary Care Jaileen Janelle: Lynnea Ferrier Other Clinician: Referring Margaurite Salido: Treating Jude Naclerio/Extender: Princella Pellegrini in Treatment: 6 Wound Status Wound Number: 2 Primary Etiology: Venous Leg Ulcer Wound Location: Left, Lateral Lower Leg Wound Status: Open Wounding Event: Gradually Appeared Date Acquired: 01/13/2023 Weeks Of Treatment: 6 Clustered Wound: No Wound  Measurements Length: (cm) 2.1 Width: (cm) 1.1 Depth: (cm) 0.1 Area: (cm) 1.814 Volume: (cm) 0.181 % Reduction in Area: -61.5% % Reduction in Volume: -61.6% Wound Description Classification: Full Thickness Without Exposed Suppor Exudate Amount: Medium Exudate Type: Serosanguineous Exudate Color: red, brown t Structures Periwound Skin Texture Texture Color No Abnormalities Noted: No No Abnormalities Noted: No Moisture No Abnormalities Noted: No Treatment Notes Wound #2 (Lower Leg) Wound Laterality: Left, Lateral Cleanser Soap and Water Discharge Instruction: May shower and wash wound with dial antibacterial soap and water prior to dressing change. Wound Cleanser Discharge Instruction: Cleanse the wound with wound cleanser prior to applying a clean dressing using gauze sponges, not tissue or cotton balls. Peri-Wound Care Sween Lotion (Moisturizing lotion) Discharge Instruction: Apply moisturizing lotion as directed Topical Gentamicin RONDO, KOEL ([id number]) [name] Page 5 of 5 Discharge Instruction: As directed by physician Mupirocin Ointment Discharge Instruction: Apply Mupirocin (Bactroban) as instructed Primary Dressing Hydrofera Blue Ready Transfer Foam, 2.5x2.5 (in/in) Discharge Instruction: Apply directly to wound bed as directed Secondary Dressing ABD Pad, 5x9 Discharge Instruction: Apply over primary dressing as directed. Secured With Compression Wrap Urgo K2 Lite, (equivalent to a 3 layer) two layer compression system, regular Discharge Instruction: Apply Urgo K2 Lite as directed (alternative to 3 layer compression). Compression Stockings Add-Ons Electronic Signature(s) Signed: 03/11/2023 4:21:45  PM By: Thayer Dallas Entered By: Thayer Dallas on 03/11/2023 10:29:16 -------------------------------------------------------------------------------- Vitals Details Patient Name: Date of Service: [name], [name] 03/11/2023  10:15 A M Medical Record Number: [id number] Patient Account Number: [id number] Date of Birth/Sex: Treating RN: 1956/01/02 (67 y.o. M) Primary Care Careem Yasui: Lynnea Ferrier Other Clinician: Referring Zaleigh Bermingham: Treating Nile Dorning/Extender: Princella Pellegrini in Treatment: 6 Vital Signs Time Taken: 10:28 Reference Range: 80 - 120 mg / dl Height (in): 68 Weight (lbs): 208 Body Mass Index (BMI): 31.6 Electronic Signature(s) Signed: 03/11/2023 4:21:45 PM By: Thayer Dallas Entered By: Thayer Dallas on 03/11/2023 10:29:02

## 2023-03-12 NOTE — Progress Notes (Signed)
DAYSHAUN, BASALDUA ([id number]) [id number] Page 1 of 1 Visit Report for 03/11/2023 SuperBill Details Patient Name: Date of Service: [name] 03/11/2023 Medical Record Number: [id number] Patient Account Number: [id number] Date of Birth/Sex: Treating RN: [birth year] (67 y.o. M) Primary Care Provider: Lynnea Ferrier Other Clinician: Thayer Dallas Referring Provider: Treating Provider/Extender: Princella Pellegrini in Treatment: 6 Diagnosis Coding ICD-10 Codes Code Description [phone] Non-pressure chronic ulcer of other part of right lower leg with fat layer exposed L97.822 Non-pressure chronic ulcer of other part of left lower leg with fat layer exposed I87.333 Chronic venous hypertension (idiopathic) with ulcer and inflammation of bilateral lower extremity I73.9 Peripheral vascular disease, unspecified Facility Procedures CPT4 Description Modifier Quantity Code [id number] [phone] BILATERAL: Application of multi-layer venous compression system; leg (below knee), including ankle and 1 foot. Electronic Signature(s) Signed: 03/11/2023 4:21:45 PM By: Thayer Dallas Signed: 03/12/2023 10:03:28 AM By: Baltazar Najjar MD Entered By: Thayer Dallas on 03/11/2023 09:06:02

## 2023-03-18 ENCOUNTER — Encounter (HOSPITAL_BASED_OUTPATIENT_CLINIC_OR_DEPARTMENT_OTHER): Payer: Medicare Other | Admitting: General Surgery

## 2023-03-18 DIAGNOSIS — I87333 Chronic venous hypertension (idiopathic) with ulcer and inflammation of bilateral lower extremity: Secondary | ICD-10-CM | POA: Diagnosis not present

## 2023-03-19 NOTE — Progress Notes (Signed)
[name], [name] ([id number]) [id number] Page 1 of 10 Visit Report for 03/18/2023 Arrival Information Details Patient Name: Date of Service: [name] [name] 03/18/2023 10:00 A M Medical Record Number: [id number] Patient Account Number: [id number] Date of Birth/Sex: Treating RN: 13-May-1955 (67 y.o. M) Primary Care [name]: [name] Other Clinician: Referring [name]: Treating [name]/Extender: [name] in Treatment: 7 Visit Information History Since Last Visit Added or deleted any medications: No Patient Arrived: Ambulatory Any new allergies or adverse reactions: No Arrival Time: 09:55 Had a fall or experienced change in No Accompanied By: wife activities of daily living that may affect Transfer Assistance: None risk of falls: Patient Identification Verified: Yes Signs or symptoms of abuse/neglect since last visito No Secondary Verification Process Completed: Yes Hospitalized since last visit: No Patient Requires Transmission-Based Precautions: No Implantable device outside of the clinic excluding No Patient Has Alerts: No cellular tissue based products placed in the center since last visit: Pain Present Now: No Electronic Signature(s) Signed: 03/18/2023 11:26:38 AM By: [name] Entered By: [name] on 03/18/2023 06:56:15 -------------------------------------------------------------------------------- Compression Therapy Details Patient Name: Date of Service: [name], RO [name] 03/18/2023 10:00 A M Medical Record Number: [id number] Patient Account Number: [id number] Date of Birth/Sex: Treating RN: 1955-06-01 (67 y.o. [name] Primary Care Larisha Vencill: [name] Other Clinician: Referring Korene Dula: Treating [name]/Extender: [name] in Treatment: 7 Compression Therapy Performed for Wound Assessment: Wound #1 Right,Lateral Lower Leg Performed By: Clinician  Gregory Schwalbe, RN Compression Type: Three Layer Post Procedure Diagnosis Same as Pre-procedure Notes URGO K2 Lites Electronic Signature(s) Signed: 03/18/2023 6:24:52 PM By: Gregory Schwalbe RN Entered By: [name] on 03/18/2023 07:29:14 [name] ([id number]) [id number] Page 2 of 10 -------------------------------------------------------------------------------- Compression Therapy Details Patient Name: Date of Service: [name] [name] 03/18/2023 10:00 A M Medical Record Number: [id number] Patient Account Number: [id number] Date of Birth/Sex: Treating RN: [birth year] (67 y.o. [name] Primary Care Preciosa Bundrick: [name] Other Clinician: Referring Sayeed Weatherall: Treating [name]/Extender: [name] in Treatment: 7 Compression Therapy Performed for Wound Assessment: Wound #2 Left,Lateral Lower Leg Performed By: Clinician Gregory Schwalbe, RN Compression Type: Three Layer Post Procedure Diagnosis Same as Pre-procedure Notes URGO K2 Lites Electronic Signature(s) Signed: 03/18/2023 6:24:52 PM By: Gregory Schwalbe RN Entered By: [name] on 03/18/2023 07:29:14 -------------------------------------------------------------------------------- Encounter Discharge Information Details Patient Name: Date of Service: [name], RO [name] 03/18/2023 10:00 A M Medical Record Number: [id number] Patient Account Number: [id number] Date of Birth/Sex: Treating RN: 01-29-1956 (67 y.o. [name] Primary Care Paulette Rockford: [name] Other Clinician: Referring Coltyn Hanning: Treating Yardley Beltran/Extender: [name] in Treatment: 7 Encounter Discharge Information Items Post Procedure Vitals Discharge Condition: Stable Temperature (F): 97.7 Ambulatory Status: Walker Pulse (bpm): 78 Discharge Destination: Home Respiratory Rate (breaths/min): 18 Transportation: Private Auto Blood  Pressure (mmHg): 126/61 Accompanied By: self Schedule Follow-up Appointment: Yes Clinical Summary of Care: Patient Declined Electronic Signature(s) Signed: 03/18/2023 6:24:52 PM By: Gregory Schwalbe RN Entered By: [name] on 03/18/2023 15:23:48 -------------------------------------------------------------------------------- Lower Extremity Assessment Details Patient Name: Date of Service: [name], [place] [name] 03/18/2023 10:00 A M Medical Record Number: [id number] Patient Account Number: [id number] Date of Birth/Sex: Treating RN: 12-07-1955 (67 y.o. [name] Primary Care Lusero Nordlund: [name] Other Clinician: Referring Chea Malan: Treating [name]/Extender: [name], Gregory Maduro ([id number]) [id number] Page 3 of 10 Weeks in Treatment: 7 Edema Assessment Assessed: [Left: No] [Right: No] Edema: [Left: Yes] [Right: Yes]  Calf Left: Right: Point of Measurement: From Medial Instep 35 cm 37 cm Ankle Left: Right: Point of Measurement: From Medial Instep 23 cm 23 cm Vascular Assessment Pulses: Dorsalis Pedis Palpable: [Left:Yes] [Right:Yes] Extremity colors, hair growth, and conditions: Extremity Color: [Left:Hyperpigmented] [Right:Hyperpigmented] Hair Growth on Extremity: [Left:No] [Right:No] Temperature of Extremity: [Left:Cool] [Right:Cool] Capillary Refill: [Left:< 3 seconds] [Right:< 3 seconds] Dependent Rubor: [Left:No Yes] [Right:No Yes] Electronic Signature(s) Signed: 03/18/2023 6:24:52 PM By: Gregory Schwalbe RN Entered By: [name] on 03/18/2023 07:22:31 -------------------------------------------------------------------------------- Multi Wound Chart Details Patient Name: Date of Service: [name], RO [name] 03/18/2023 10:00 A M Medical Record Number: [id number] Patient Account Number: [id number] Date of Birth/Sex: Treating RN: October 27, 1955 (67 y.o. M) Primary Care Greco Gastelum: [name] Other  Clinician: Referring Fleetwood Pierron: Treating Bolden Hagerman/Extender: [name] in Treatment: 7 Vital Signs Height(in): 68 Pulse(bpm): 78 Weight(lbs): 208 Blood Pressure(mmHg): 126/61 Body Mass Index(BMI): 31.6 Temperature(F): 97.7 Respiratory Rate(breaths/min): 18 [1:Photos:] [N/A:N/A] Right, Lateral Lower Leg Left, Lateral Lower Leg N/A Wound Location: Gradually Appeared Gradually Appeared N/A Wounding Event: Venous Leg Ulcer Venous Leg Ulcer N/A Primary Etiology: Chronic Obstructive Pulmonary Chronic Obstructive Pulmonary N/A Comorbid History: Disease (COPD), Angina, Arrhythmia, Disease (COPD), Angina, Arrhythmia, Coronary Artery Disease, Coronary Artery Disease, [name], [name] ([id number]) [id number] Page 4 of 10 Hypertension, Myocardial Infarction, Hypertension, Myocardial Infarction, Peripheral Venous Disease Peripheral Venous Disease 10/31/2022 01/13/2023 N/A Date Acquired: 7 7 N/A Weeks of Treatment: Open Open N/A Wound Status: No No N/A Wound Recurrence: 2.9x1x0.1 1.9x0.5x0.1 N/A Measurements L x W x D (cm) 2.278 0.746 N/A A (cm) : rea 0.228 0.075 N/A Volume (cm) : 75.80% 33.60% N/A % Reduction in A rea: 75.80% 33.00% N/A % Reduction in Volume: Full Thickness Without Exposed Full Thickness Without Exposed N/A Classification: Support Structures Support Structures Medium Medium N/A Exudate A mount: Serosanguineous Serosanguineous N/A Exudate Type: red, brown red, brown N/A Exudate Color: Small (1-33%) Small (1-33%) N/A Granulation A mount: Red Red N/A Granulation Quality: Large (67-100%) Large (67-100%) N/A Necrotic A mount: Eschar, Adherent Slough Eschar, Adherent Slough N/A Necrotic Tissue: Fat Layer (Subcutaneous Tissue): Yes Fat Layer (Subcutaneous Tissue): Yes N/A Exposed Structures: Fascia: No Tendon: No Muscle: No Joint: No Bone: No Small (1-33%) Small (1-33%)  N/A Epithelialization: Debridement - Excisional Debridement - Excisional N/A Debridement: Pre-procedure Verification/Time Out 10:20 10:20 N/A Taken: Lidocaine 4% Topical Solution Lidocaine 4% Topical Solution N/A Pain Control: Subcutaneous, Slough Subcutaneous, Slough N/A Tissue Debrided: Skin/Subcutaneous Tissue Skin/Subcutaneous Tissue N/A Level: 2.28 0.75 N/A Debridement A (sq cm): rea Curette Curette N/A Instrument: Minimum Minimum N/A Bleeding: Pressure Pressure N/A Hemostasis A chieved: Procedure was tolerated well Procedure was tolerated well N/A Debridement Treatment Response: 2.9x1x0.1 1.9x0.5x0.1 N/A Post Debridement Measurements L x W x D (cm) 0.228 0.075 N/A Post Debridement Volume: (cm) Scarring: Yes Scarring: Yes N/A Periwound Skin Texture: Excoriation: No Induration: No Callus: No Crepitus: No Rash: No Maceration: No No Abnormalities Noted N/A Periwound Skin Moisture: Dry/Scaly: No Hemosiderin Staining: Yes Hemosiderin Staining: Yes N/A Periwound Skin Color: Atrophie Blanche: No Cyanosis: No Ecchymosis: No Erythema: No Mottled: No Pallor: No Rubor: No No Abnormality No Abnormality N/A Temperature: Compression Therapy Compression Therapy N/A Procedures Performed: Debridement Debridement Treatment Notes Electronic Signature(s) Signed: 03/18/2023 10:39:19 AM By: Duanne Guess MD FACS Entered By: Duanne Guess on 03/18/2023 07:35:13 -------------------------------------------------------------------------------- Multi-Disciplinary Care Plan Details Patient Name: Date of Service: [name], RO [name] 03/18/2023 10:00 A M Medical Record Number: [id number] Patient Account Number: [id number] Date of Birth/Sex: Treating RN: Nov 20, 1955 (67  y.o. [name] Primary Care Hardin Hardenbrook: [name] Other Clinician: Referring Chandelle Harkey: Treating Azalya Galyon/Extender: [name] Fox, Gregory Maduro ([id number])  [id number] Page 5 of 10 Weeks in Treatment: 7 Multidisciplinary Care Plan reviewed with physician Active Inactive Venous Leg Ulcer Nursing Diagnoses: Actual venous Insuffiency (use after diagnosis is confirmed) Knowledge deficit related to disease process and management Goals: Patient will maintain optimal edema control Date Initiated: 01/23/2023 Target Resolution Date: 05/30/2023 Goal Status: Active Patient/caregiver will verbalize understanding of disease process and disease management Date Initiated: 01/23/2023 Target Resolution Date: 05/30/2023 Goal Status: Active Interventions: Assess peripheral edema status every visit. Compression as ordered Provide education on venous insufficiency Treatment Activities: Therapeutic compression applied : 01/23/2023 Notes: Wound/Skin Impairment Nursing Diagnoses: Impaired tissue integrity Knowledge deficit related to ulceration/compromised skin integrity Goals: Patient/caregiver will verbalize understanding of skin care regimen Date Initiated: 01/23/2023 Target Resolution Date: 05/30/2023 Goal Status: Active Interventions: Assess ulceration(s) every visit Treatment Activities: Skin care regimen initiated : 01/23/2023 Topical wound management initiated : 01/23/2023 Notes: Electronic Signature(s) Signed: 03/18/2023 6:24:52 PM By: Gregory Schwalbe RN Entered By: [name] on 03/18/2023 15:22:43 -------------------------------------------------------------------------------- Pain Assessment Details Patient Name: Date of Service: [name], RO [name] 03/18/2023 10:00 A M Medical Record Number: [id number] Patient Account Number: [id number] Date of Birth/Sex: Treating RN: 09/06/1955 (67 y.o. M) Primary Care Mazy Culton: [name] Other Clinician: Referring Doloris Servantes: Treating Breonia Kirstein/Extender: [name] in Treatment: 7 Active Problems Location of Pain Severity and  Description of Pain Patient Has Paino No LONAN, WESTENSKOW ([id number]) [id number] Page 6 of 10 Patient Has Paino No Site Locations Pain Management and Medication Current Pain Management: Electronic Signature(s) Signed: 03/18/2023 11:26:38 AM By: [name] Entered By: [name] on 03/18/2023 06:56:48 -------------------------------------------------------------------------------- Patient/Caregiver Education Details Patient Name: Date of Service: [name], RO [name] 12/17/2024andnbsp10:00 A M Medical Record Number: [id number] Patient Account Number: [id number] Date of Birth/Gender: Treating RN: [birth year] (67 y.o. [name] Primary Care Physician: [name] Other Clinician: Referring Physician: Treating Physician/Extender: [name] in Treatment: 7 Education Assessment Education Provided To: Patient Education Topics Provided Wound/Skin Impairment: Methods: Explain/Verbal Responses: State content correctly Electronic Signature(s) Signed: 03/18/2023 6:24:52 PM By: Gregory Schwalbe RN Entered By: [name] on 03/18/2023 15:22:58 -------------------------------------------------------------------------------- Wound Assessment Details Patient Name: Date of Service: [name], RO [name] 03/18/2023 10:00 A M Medical Record Number: [id number] Patient Account Number: [id number] Date of Birth/Sex: Treating RN: [birth year] (67 y.o. Shaune Leeks, Gregory Maduro ([id number]) [id number] Page 7 of 10 Primary Care Claramae Rigdon: [name] Other Clinician: Referring Benedict Kue: Treating Cobe Viney/Extender: [name] in Treatment: 7 Wound Status Wound Number: 1 Primary Venous Leg Ulcer Etiology: Wound Location: Right, Lateral Lower Leg Wound Open Wounding Event: Gradually Appeared Status: Date Acquired: 10/31/2022 Comorbid Chronic Obstructive Pulmonary Disease (COPD),  Angina, Weeks Of Treatment: 7 History: Arrhythmia, Coronary Artery Disease, Hypertension, Myocardial Clustered Wound: No Infarction, Peripheral Venous Disease Photos Wound Measurements Length: (cm) 2.9 Width: (cm) 1 Depth: (cm) 0.1 Area: (cm) 2.278 Volume: (cm) 0.228 % Reduction in Area: 75.8% % Reduction in Volume: 75.8% Epithelialization: Small (1-33%) Tunneling: No Undermining: No Wound Description Classification: Full Thickness Without Exposed Support Structures Exudate Amount: Medium Exudate Type: Serosanguineous Exudate Color: red, brown Foul Odor After Cleansing: No Slough/Fibrino Yes Wound Bed Granulation Amount: Small (1-33%) Exposed Structure Granulation Quality: Red Fascia Exposed: No Necrotic Amount: Large (67-100%) Fat Layer (Subcutaneous Tissue) Exposed: Yes Necrotic Quality: Eschar, Adherent Slough Tendon Exposed: No Muscle Exposed: No Joint Exposed: No Bone  Exposed: No Periwound Skin Texture Texture Color No Abnormalities Noted: No No Abnormalities Noted: No Callus: No Atrophie Blanche: No Crepitus: No Cyanosis: No Excoriation: No Ecchymosis: No Induration: No Erythema: No Rash: No Hemosiderin Staining: Yes Scarring: Yes Mottled: No Pallor: No Moisture Rubor: No No Abnormalities Noted: Yes Temperature / Pain Temperature: No Abnormality Treatment Notes Wound #1 (Lower Leg) Wound Laterality: Right, Lateral Cleanser Soap and Water Discharge Instruction: May shower and wash wound with dial antibacterial soap and water prior to dressing change. Wound Cleanser Discharge Instruction: Cleanse the wound with wound cleanser prior to applying a clean dressing using gauze sponges, not tissue or cotton balls. SIMPSON, BAUMERT ([id number]) [id number] Page 8 of 10 Peri-Wound Care Sween Lotion (Moisturizing lotion) Discharge Instruction: Apply moisturizing lotion as directed Topical Gentamicin Discharge Instruction: As  directed by physician Mupirocin Ointment Discharge Instruction: Apply Mupirocin (Bactroban) as instructed Primary Dressing Hydrofera Blue Ready Transfer Foam, 2.5x2.5 (in/in) Discharge Instruction: Apply directly to wound bed as directed Secondary Dressing ABD Pad, 5x9 Discharge Instruction: Apply over primary dressing as directed. Secured With Compression Wrap Urgo K2 Lite, (equivalent to a 3 layer) two layer compression system, regular Discharge Instruction: Apply Urgo K2 Lite as directed (alternative to 3 layer compression). Compression Stockings Add-Ons Electronic Signature(s) Signed: 03/18/2023 6:24:52 PM By: Gregory Schwalbe RN Entered By: [name] on 03/18/2023 07:22:18 -------------------------------------------------------------------------------- Wound Assessment Details Patient Name: Date of Service: [name], RO [name] 03/18/2023 10:00 A M Medical Record Number: [id number] Patient Account Number: [id number] Date of Birth/Sex: Treating RN: May 13, 1955 (67 y.o. M) Primary Care Bryan Goin: [name] Other Clinician: Referring Patrina Andreas: Treating Brien Lowe/Extender: [name] in Treatment: 7 Wound Status Wound Number: 2 Primary Venous Leg Ulcer Etiology: Wound Location: Left, Lateral Lower Leg Wound Open Wounding Event: Gradually Appeared Status: Date Acquired: 01/13/2023 Comorbid Chronic Obstructive Pulmonary Disease (COPD), Angina, Weeks Of Treatment: 7 History: Arrhythmia, Coronary Artery Disease, Hypertension, Myocardial Clustered Wound: No Infarction, Peripheral Venous Disease Photos Wound Measurements Length: (cm) 1.9 Width: (cm) 0.5 Parcel, Jaaziah ([id number]) Depth: (cm) 0.1 Area: (cm) 0.746 Volume: (cm) 0.075 % Reduction in Area: 33.6% % Reduction in Volume: 33% [id number] Page 9 of 10 Epithelialization: Small (1-33%) Tunneling: No Undermining: No Wound Description Classification: Full  Thickness Without Exposed Support Structures Exudate Amount: Medium Exudate Type: Serosanguineous Exudate Color: red, brown Foul Odor After Cleansing: No Slough/Fibrino Yes Wound Bed Granulation Amount: Small (1-33%) Exposed Structure Granulation Quality: Red Fat Layer (Subcutaneous Tissue) Exposed: Yes Necrotic Amount: Large (67-100%) Necrotic Quality: Eschar, Adherent Slough Periwound Skin Texture Texture Color No Abnormalities Noted: No No Abnormalities Noted: No Scarring: Yes Hemosiderin Staining: Yes Moisture Temperature / Pain No Abnormalities Noted: Yes Temperature: No Abnormality Treatment Notes Wound #2 (Lower Leg) Wound Laterality: Left, Lateral Cleanser Soap and Water Discharge Instruction: May shower and wash wound with dial antibacterial soap and water prior to dressing change. Wound Cleanser Discharge Instruction: Cleanse the wound with wound cleanser prior to applying a clean dressing using gauze sponges, not tissue or cotton balls. Peri-Wound Care Sween Lotion (Moisturizing lotion) Discharge Instruction: Apply moisturizing lotion as directed Topical Gentamicin Discharge Instruction: As directed by physician Mupirocin Ointment Discharge Instruction: Apply Mupirocin (Bactroban) as instructed Primary Dressing Hydrofera Blue Ready Transfer Foam, 2.5x2.5 (in/in) Discharge Instruction: Apply directly to wound bed as directed Secondary Dressing ABD Pad, 5x9 Discharge Instruction: Apply over primary dressing as directed. Secured With Compression Wrap Urgo K2 Lite, (equivalent to a 3 layer) two layer compression system, regular Discharge Instruction: Apply Urgo K2 Lite  as directed (alternative to 3 layer compression). Compression Stockings Add-Ons Electronic Signature(s) Signed: 03/18/2023 6:24:52 PM By: Gregory Schwalbe RN Entered By: [name] on 03/18/2023 07:26:13 [name] ([id number]) [id number] Page 10 of  10 -------------------------------------------------------------------------------- Vitals Details Patient Name: Date of Service: [name] [name] 03/18/2023 10:00 A M Medical Record Number: [id number] Patient Account Number: [id number] Date of Birth/Sex: Treating RN: 18-Dec-1955 (67 y.o. M) Primary Care Nuriyah Hanline: [name] Other Clinician: Referring Juliya Magill: Treating Rolinda Impson/Extender: [name] in Treatment: 7 Vital Signs Time Taken: 09:56 Temperature (F): 97.7 Height (in): 68 Pulse (bpm): 78 Weight (lbs): 208 Respiratory Rate (breaths/min): 18 Body Mass Index (BMI): 31.6 Blood Pressure (mmHg): 126/61 Reference Range: 80 - 120 mg / dl Electronic Signature(s) Signed: 03/18/2023 11:26:38 AM By: [name] Entered By: [name] on 03/18/2023 06:56:39

## 2023-03-19 NOTE — Progress Notes (Signed)
MELVIN, ALM ([id number]) [id number] Page 1 of 10 Visit Report for 03/18/2023 Chief Complaint Document Details Patient Name: Date of Service: [name] [name] 03/18/2023 10:00 A M Medical Record Number: [id number] Patient Account Number: [id number] Date of Birth/Sex: Treating RN: April 20, 1955 (67 y.o. M) Primary Care Provider: Lynnea Ferrier Other Clinician: Referring Provider: Treating Provider/Extender: [name] in Treatment: 7 Information Obtained from: Patient Chief Complaint Patient presents for treatment of open ulcers due to venous insufficiency Electronic Signature(s) Signed: 03/18/2023 10:39:19 AM By: Duanne Guess MD FACS Entered By: Duanne Guess on 03/18/2023 10:35:20 -------------------------------------------------------------------------------- Debridement Details Patient Name: Date of Service: [name], RO [name] 03/18/2023 10:00 A M Medical Record Number: [id number] Patient Account Number: [id number] Date of Birth/Sex: Treating RN: [birth year] (67 y.o. [name] Primary Care Provider: Lynnea Ferrier Other Clinician: Referring Provider: Treating Provider/Extender: [name] in Treatment: 7 Debridement Performed for Assessment: Wound #2 Left,Lateral Lower Leg Performed By: Physician Duanne Guess, MD The following information was scribed by: Karie Schwalbe The information was scribed for: Duanne Guess Debridement Type: Debridement Severity of Tissue Pre Debridement: Fat layer exposed Level of Consciousness (Pre-procedure): Awake and Alert Pre-procedure Verification/Time Out Yes - 10:20 Taken: Start Time: 10:20 Pain Control: Lidocaine 4% T opical Solution Percent of Wound Bed Debrided: 100% T Area Debrided (cm): otal 0.75 Tissue and other material debrided: Viable, Non-Viable, Slough, Subcutaneous, Slough Level: Skin/Subcutaneous Tissue Debridement  Description: Excisional Instrument: Curette Bleeding: Minimum Hemostasis Achieved: Pressure Response to Treatment: Procedure was tolerated well Level of Consciousness (Post- Awake and Alert procedure): Post Debridement Measurements of Total Wound Length: (cm) 1.9 Width: (cm) 0.5 Depth: (cm) 0.1 Volume: (cm) 0.075 Character of Wound/Ulcer Post Debridement: Improved Severity of Tissue Post Debridement: Fat layer exposed [name] ([id number]) [id number] Page 2 of 10 Post Procedure Diagnosis Same as Pre-procedure Electronic Signature(s) Signed: 03/18/2023 10:39:19 AM By: Duanne Guess MD FACS Signed: 03/18/2023 6:24:52 PM By: Karie Schwalbe RN Entered By: Karie Schwalbe on 03/18/2023 10:27:53 -------------------------------------------------------------------------------- Debridement Details Patient Name: Date of Service: [name], RO [name] 03/18/2023 10:00 A M Medical Record Number: [id number] Patient Account Number: [id number] Date of Birth/Sex: Treating RN: 1955/11/10 (67 y.o. [name] Primary Care Provider: Lynnea Ferrier Other Clinician: Referring Provider: Treating Provider/Extender: [name] in Treatment: 7 Debridement Performed for Assessment: Wound #1 Right,Lateral Lower Leg Performed By: Physician Duanne Guess, MD The following information was scribed by: Karie Schwalbe The information was scribed for: Duanne Guess Debridement Type: Debridement Severity of Tissue Pre Debridement: Fat layer exposed Level of Consciousness (Pre-procedure): Awake and Alert Pre-procedure Verification/Time Out Yes - 10:20 Taken: Start Time: 10:20 Pain Control: Lidocaine 4% T opical Solution Percent of Wound Bed Debrided: 100% T Area Debrided (cm): otal 2.28 Tissue and other material debrided: Viable, Non-Viable, Slough, Subcutaneous, Slough Level: Skin/Subcutaneous Tissue Debridement Description:  Excisional Instrument: Curette Bleeding: Minimum Hemostasis Achieved: Pressure Response to Treatment: Procedure was tolerated well Level of Consciousness (Post- Awake and Alert procedure): Post Debridement Measurements of Total Wound Length: (cm) 2.9 Width: (cm) 1 Depth: (cm) 0.1 Volume: (cm) 0.228 Character of Wound/Ulcer Post Debridement: Improved Severity of Tissue Post Debridement: Fat layer exposed Post Procedure Diagnosis Same as Pre-procedure Electronic Signature(s) Signed: 03/18/2023 10:39:19 AM By: Duanne Guess MD FACS Signed: 03/18/2023 6:24:52 PM By: Karie Schwalbe RN Entered By: Karie Schwalbe on 03/18/2023 10:28:48 HPI Details -------------------------------------------------------------------------------- [name] ([id number]) [id number] Page 3 of 10 Patient Name: Date of Service: [name], RO [name]  03/18/2023 10:00 A M Medical Record Number: [id number] Patient Account Number: [id number] Date of Birth/Sex: Treating RN: 1956/02/20 (67 y.o. M) Primary Care Provider: Lynnea Ferrier Other Clinician: Referring Provider: Treating Provider/Extender: [name] in Treatment: 7 History of Present Illness HPI Description: ADMISSION 01/23/2023 ***VASCULAR STUDIES:*** ABIs 11/2021: +-------+-----------+-----------+------------+------------+ ABI/TBIT oday's ABIT oday's TBIPrevious ABIPrevious TBI +-------+-----------+-----------+------------+------------+ Right 1.08 0.35 0.82 0.29  +-------+-----------+-----------+------------+------------+ Left 0.97 0.27 0.88 0.27  +-------+-----------+-----------+------------+------------+ Venous reflux 03/2021 Venous Reflux Times +--------------+---------+------+-----------+------------+--------+ RIGHT Reflux NoRefluxReflux TimeDiameter cmsComments    Yes     +--------------+---------+------+-----------+------------+--------+ CFV    yes  >1 second    +--------------+---------+------+-----------+------------+--------+ FV prox   yes  >1 second    +--------------+---------+------+-----------+------------+--------+ FV mid   yes  >1 second    +--------------+---------+------+-----------+------------+--------+ FV dist   yes  >1 second    +--------------+---------+------+-----------+------------+--------+ Popliteal   yes  >1 second    +--------------+---------+------+-----------+------------+--------+ GSV at SFJ   yes  >500 ms  0.736   +--------------+---------+------+-----------+------------+--------+ GSV prox thigh  yes  >500 ms  0.591   +--------------+---------+------+-----------+------------+--------+ GSV mid thigh   yes  >500 ms  0.591   +--------------+---------+------+-----------+------------+--------+ GSV dist thigh  yes  >500 ms  0.546   +--------------+---------+------+-----------+------------+--------+ GSV at knee no    0.592   +--------------+---------+------+-----------+------------+--------+ GSV prox calf     0.441   +--------------+---------+------+-----------+------------+--------+ GSV mid calf     0.461   +--------------+---------+------+-----------+------------+--------+ SSV Pop Fossa no    0.406   +--------------+---------+------+-----------+------------+--------+ SSV prox calf   yes  >500 ms  0.379   +--------------+---------+------+-----------+------------+--------+ SSV mid calf     0.379   +--------------+---------+------+-----------+------------+--------+ Summary: Right: - No evidence of deep vein thrombosis seen in the right lower extremity, from the common femoral through the popliteal veins. - No evidence of superficial venous thrombosis in the right lower extremity. - Venous reflux is noted in the right common femoral vein. - Venous reflux is noted in the right sapheno-femoral  junction. - Venous reflux is noted in the right greater saphenous vein in the thigh. - Venous reflux is noted in the right femoral vein. - Venous reflux is noted in the right popliteal vein. - Venous reflux is noted in the right short saphenous vein. This is a 67 year old smoker with a significant history of cardiac and peripheral vascular disease. He has had issues with venous ulceration on his right leg that have resolved with local care in the past, but the most recent wound has been present for a couple of months and has not healed despite treatment with Silvadene cream and home application of Unna boots. He also has developed 2 small ulcers on the left that have presented within the past couple of weeks. Of note, his right greater saphenous vein was harvested in 2023 for femoral above-knee popliteal bypass. He was referred here by his primary care provider for further evaluation and management of his lower extremity venous ulcers. 01/31/2023: The left medial lower leg wound is healed. The left lateral and right lateral lower leg wounds are both smaller. They have slough accumulation on their surfaces. Edema control is excellent. 02/05/2023: Both wounds are just slightly smaller. They are cleaner, however, with a bit of slough accumulation on the surfaces. Edema control remains good. 02/14/2023: The wounds are smaller again today. They have slough on the surface. Edema control is good. 02/19/2023: The wounds measured about the same size today, but there is more epithelialization occurring in the midportion of the wounds. There is slough accumulation once again. Edema control  remains good. [name], [name] ([id number]) [id number] Page 4 of 10 02/25/2023: The left lateral leg wound measures slightly longer today. Both wounds have more slough on them this week then on prior occasions. Edema control is good. No erythema, induration, or malodor to suggest  infection. 03/18/2023: Both wounds measured smaller today. There is an enlarging bud of epithelialized tissue in the center of the right leg wound. There is slough on the surface of both. Edema control is good. Electronic Signature(s) Signed: 03/18/2023 10:39:19 AM By: Duanne Guess MD FACS Entered By: Duanne Guess on 03/18/2023 10:36:12 -------------------------------------------------------------------------------- Physical Exam Details Patient Name: Date of Service: [name], RO [name] 03/18/2023 10:00 A M Medical Record Number: [id number] Patient Account Number: [id number] Date of Birth/Sex: Treating RN: 01-20-1956 (67 y.o. M) Primary Care Provider: Lynnea Ferrier Other Clinician: Referring Provider: Treating Provider/Extender: [name] in Treatment: 7 Constitutional . . . . no acute distress. Respiratory audibly wheezing, but states no increased effort. Notes 03/18/2023: Both wounds measured smaller today. There is an enlarging bud of epithelialized tissue in the center of the right leg wound. There is slough on the surface of both. Edema control is good. Electronic Signature(s) Signed: 03/18/2023 10:39:19 AM By: Duanne Guess MD FACS Entered By: Duanne Guess on 03/18/2023 10:37:14 -------------------------------------------------------------------------------- Physician Orders Details Patient Name: Date of Service: [name], RO [name] 03/18/2023 10:00 A M Medical Record Number: [id number] Patient Account Number: [id number] Date of Birth/Sex: Treating RN: [birth year] (67 y.o. [name] Primary Care Provider: Lynnea Ferrier Other Clinician: Referring Provider: Treating Provider/Extender: [name] in Treatment: 7 Verbal / Phone Orders: No Diagnosis Coding ICD-10 Coding Code Description [phone] Non-pressure chronic ulcer of other part of right lower leg with fat layer exposed L97.822  Non-pressure chronic ulcer of other part of left lower leg with fat layer exposed I87.333 Chronic venous hypertension (idiopathic) with ulcer and inflammation of bilateral lower extremity I73.9 Peripheral vascular disease, unspecified Follow-up Appointments ppointment in 1 week. - Dr. Lady Gary 03/28/23 at 8:45am Return A ppointment in 2 weeks. - Dr. Lady Gary Please schedule with front desk Return A Return appointment in 3 weeks. - Dr. Lady Gary Please schedule with front desk DAMON, GERSH ([id number]) [id number] Page 5 of 10 Anesthetic (In clinic) Topical Lidocaine 4% applied to wound bed Bathing/ Shower/ Hygiene May shower with protection but do not get wound dressing(s) wet. Protect dressing(s) with water repellant cover (for example, large plastic bag) or a cast cover and may then take shower. Edema Control - Orders / Instructions Elevate legs to the level of the heart or above for 30 minutes daily and/or when sitting for 3-4 times a day throughout the day. Avoid standing for long periods of time. Exercise regularly Additional Orders / Instructions Follow Nutritious Diet - vitamin C 500 mg start once a day and if tolerated then twice a day, zinc 30-50 mg once daily, protein shakes daily Wound Treatment Wound #1 - Lower Leg Wound Laterality: Right, Lateral Cleanser: Soap and Water 1 x Per Week/30 Days Discharge Instructions: May shower and wash wound with dial antibacterial soap and water prior to dressing change. Cleanser: Wound Cleanser 1 x Per Week/30 Days Discharge Instructions: Cleanse the wound with wound cleanser prior to applying a clean dressing using gauze sponges, not tissue or cotton balls. Peri-Wound Care: Sween Lotion (Moisturizing lotion) 1 x Per Week/30 Days Discharge Instructions: Apply moisturizing lotion as directed Topical: Gentamicin 1 x Per Week/30 Days Discharge Instructions: As directed by physician Topical:  Mupirocin Ointment 1 x Per  Week/30 Days Discharge Instructions: Apply Mupirocin (Bactroban) as instructed Prim Dressing: Hydrofera Blue Ready Transfer Foam, 2.5x2.5 (in/in) 1 x Per Week/30 Days ary Discharge Instructions: Apply directly to wound bed as directed Secondary Dressing: ABD Pad, 5x9 1 x Per Week/30 Days Discharge Instructions: Apply over primary dressing as directed. Compression Wrap: Urgo K2 Lite, (equivalent to a 3 layer) two layer compression system, regular 1 x Per Week/30 Days Discharge Instructions: Apply Urgo K2 Lite as directed (alternative to 3 layer compression). Wound #2 - Lower Leg Wound Laterality: Left, Lateral Cleanser: Soap and Water 1 x Per Week/30 Days Discharge Instructions: May shower and wash wound with dial antibacterial soap and water prior to dressing change. Cleanser: Wound Cleanser 1 x Per Week/30 Days Discharge Instructions: Cleanse the wound with wound cleanser prior to applying a clean dressing using gauze sponges, not tissue or cotton balls. Peri-Wound Care: Sween Lotion (Moisturizing lotion) 1 x Per Week/30 Days Discharge Instructions: Apply moisturizing lotion as directed Topical: Gentamicin 1 x Per Week/30 Days Discharge Instructions: As directed by physician Topical: Mupirocin Ointment 1 x Per Week/30 Days Discharge Instructions: Apply Mupirocin (Bactroban) as instructed Prim Dressing: Hydrofera Blue Ready Transfer Foam, 2.5x2.5 (in/in) 1 x Per Week/30 Days ary Discharge Instructions: Apply directly to wound bed as directed Secondary Dressing: ABD Pad, 5x9 1 x Per Week/30 Days Discharge Instructions: Apply over primary dressing as directed. Compression Wrap: Urgo K2 Lite, (equivalent to a 3 layer) two layer compression system, regular 1 x Per Week/30 Days Discharge Instructions: Apply Urgo K2 Lite as directed (alternative to 3 layer compression). Electronic Signature(s) Signed: 03/18/2023 10:39:19 AM By: Duanne Guess MD FACS Entered By: Duanne Guess on  03/18/2023 10:37:36 [name] ([id number]) [id number] Page 6 of 10 -------------------------------------------------------------------------------- Problem List Details Patient Name: Date of Service: [name] [name] 03/18/2023 10:00 A M Medical Record Number: [id number] Patient Account Number: [id number] Date of Birth/Sex: Treating RN: 03-06-1956 (67 y.o. M) Primary Care Provider: Lynnea Ferrier Other Clinician: Referring Provider: Treating Provider/Extender: [name] in Treatment: 7 Active Problems ICD-10 Encounter Code Description Active Date MDM Diagnosis L97.812 Non-pressure chronic ulcer of other part of right lower leg with fat layer 01/23/2023 No Yes exposed L97.822 Non-pressure chronic ulcer of other part of left lower leg with fat layer 01/23/2023 No Yes exposed I87.333 Chronic venous hypertension (idiopathic) with ulcer and inflammation of 01/23/2023 No Yes bilateral lower extremity I73.9 Peripheral vascular disease, unspecified 01/23/2023 No Yes Inactive Problems Resolved Problems Electronic Signature(s) Signed: 03/18/2023 10:39:19 AM By: Duanne Guess MD FACS Entered By: Duanne Guess on 03/18/2023 10:35:05 -------------------------------------------------------------------------------- Progress Note Details Patient Name: Date of Service: [name], RO [name] 03/18/2023 10:00 A M Medical Record Number: [id number] Patient Account Number: [id number] Date of Birth/Sex: Treating RN: November 21, 1955 (67 y.o. M) Primary Care Provider: Lynnea Ferrier Other Clinician: Referring Provider: Treating Provider/Extender: [name] in Treatment: 7 Subjective Chief Complaint Information obtained from Patient Patient presents for treatment of open ulcers due to venous insufficiency History of Present Illness (HPI) ADMISSION 01/23/2023 SAMARI, [place] ([id number])  [id number] Page 7 of 10 ***VASCULAR STUDIES:*** ABIs 11/2021: +-------+-----------+-----------+------------+------------+ ABI/TBIT oday's ABIT oday's TBIPrevious ABIPrevious TBI +-------+-----------+-----------+------------+------------+ Right 1.08 0.35 0.82 0.29  +-------+-----------+-----------+------------+------------+ Left 0.97 0.27 0.88 0.27  +-------+-----------+-----------+------------+------------+ Venous reflux 03/2021 Venous Reflux Times +--------------+---------+------+-----------+------------+--------+ RIGHT Reflux NoRefluxReflux TimeDiameter cmsComments    Yes     +--------------+---------+------+-----------+------------+--------+ CFV   yes  >1 second    +--------------+---------+------+-----------+------------+--------+ FV prox  yes  >1 second    +--------------+---------+------+-----------+------------+--------+ FV mid   yes  >1 second    +--------------+---------+------+-----------+------------+--------+ FV dist   yes  >1 second    +--------------+---------+------+-----------+------------+--------+ Popliteal   yes  >1 second    +--------------+---------+------+-----------+------------+--------+ GSV at [facility]   yes  >500 ms  0.736   +--------------+---------+------+-----------+------------+--------+ GSV prox thigh  yes  >500 ms  0.591   +--------------+---------+------+-----------+------------+--------+ GSV mid thigh   yes  >500 ms  0.591   +--------------+---------+------+-----------+------------+--------+ GSV dist thigh  yes  >500 ms  0.546   +--------------+---------+------+-----------+------------+--------+ GSV at knee no    0.592   +--------------+---------+------+-----------+------------+--------+ GSV prox calf     0.441   +--------------+---------+------+-----------+------------+--------+ GSV mid calf     0.461    +--------------+---------+------+-----------+------------+--------+ SSV Pop Fossa no    0.406   +--------------+---------+------+-----------+------------+--------+ SSV prox calf   yes  >500 ms  0.379   +--------------+---------+------+-----------+------------+--------+ SSV mid calf     0.379   +--------------+---------+------+-----------+------------+--------+ Summary: Right: - No evidence of deep vein thrombosis seen in the right lower extremity, from the common femoral through the popliteal veins. - No evidence of superficial venous thrombosis in the right lower extremity. - Venous reflux is noted in the right common femoral vein. - Venous reflux is noted in the right sapheno-femoral junction. - Venous reflux is noted in the right greater saphenous vein in the thigh. - Venous reflux is noted in the right femoral vein. - Venous reflux is noted in the right popliteal vein. - Venous reflux is noted in the right short saphenous vein. This is a 67 year old smoker with a significant history of cardiac and peripheral vascular disease. He has had issues with venous ulceration on his right leg that have resolved with local care in the past, but the most recent wound has been present for a couple of months and has not healed despite treatment with Silvadene cream and home application of Unna boots. He also has developed 2 small ulcers on the left that have presented within the past couple of weeks. Of note, his right greater saphenous vein was harvested in 2023 for femoral above-knee popliteal bypass. He was referred here by his primary care provider for further evaluation and management of his lower extremity venous ulcers. 01/31/2023: The left medial lower leg wound is healed. The left lateral and right lateral lower leg wounds are both smaller. They have slough accumulation on their surfaces. Edema control is excellent. 02/05/2023: Both wounds are just slightly smaller.  They are cleaner, however, with a bit of slough accumulation on the surfaces. Edema control remains good. 02/14/2023: The wounds are smaller again today. They have slough on the surface. Edema control is good. 02/19/2023: The wounds measured about the same size today, but there is more epithelialization occurring in the midportion of the wounds. There is slough accumulation once again. Edema control remains good. 02/25/2023: The left lateral leg wound measures slightly longer today. Both wounds have more slough on them this week then on prior occasions. Edema control is good. No erythema, induration, or malodor to suggest infection. 03/18/2023: Both wounds measured smaller today. There is an enlarging bud of epithelialized tissue in the center of the right leg wound. There is slough on the surface of both. Edema control is good. [name], [name] ([id number]) [id number] Page 8 of 10 Objective Constitutional no acute distress. Vitals Time Taken: 9:56 AM, Height: 68 in, Weight: 208 lbs, BMI: 31.6, Temperature: 97.7 F, Pulse: 78 bpm, Respiratory Rate: 18 breaths/min, Blood Pressure: 126/61 mmHg.  Respiratory audibly wheezing, but states no increased effort. General Notes: 03/18/2023: Both wounds measured smaller today. There is an enlarging bud of epithelialized tissue in the center of the right leg wound. There is slough on the surface of both. Edema control is good. Integumentary (Hair, Skin) Wound #1 status is Open. Original cause of wound was Gradually Appeared. The date acquired was: 10/31/2022. The wound has been in treatment 7 weeks. The wound is located on the Right,Lateral Lower Leg. The wound measures 2.9cm length x 1cm width x 0.1cm depth; 2.278cm^2 area and 0.228cm^3 volume. There is Fat Layer (Subcutaneous Tissue) exposed. There is no tunneling or undermining noted. There is a medium amount of serosanguineous drainage noted. There is small (1-33%) red granulation  within the wound bed. There is a large (67-100%) amount of necrotic tissue within the wound bed including Eschar and Adherent Slough. The periwound skin appearance had no abnormalities noted for moisture. The periwound skin appearance exhibited: Scarring, Hemosiderin Staining. The periwound skin appearance did not exhibit: Callus, Crepitus, Excoriation, Induration, Rash, Atrophie Blanche, Cyanosis, Ecchymosis, Mottled, Pallor, Rubor, Erythema. Periwound temperature was noted as No Abnormality. Wound #2 status is Open. Original cause of wound was Gradually Appeared. The date acquired was: 01/13/2023. The wound has been in treatment 7 weeks. The wound is located on the Left,Lateral Lower Leg. The wound measures 1.9cm length x 0.5cm width x 0.1cm depth; 0.746cm^2 area and 0.075cm^3 volume. There is Fat Layer (Subcutaneous Tissue) exposed. There is no tunneling or undermining noted. There is a medium amount of serosanguineous drainage noted. There is small (1-33%) red granulation within the wound bed. There is a large (67-100%) amount of necrotic tissue within the wound bed including Eschar and Adherent Slough. The periwound skin appearance had no abnormalities noted for moisture. The periwound skin appearance exhibited: Scarring, Hemosiderin Staining. Periwound temperature was noted as No Abnormality. Assessment Active Problems ICD-10 Non-pressure chronic ulcer of other part of right lower leg with fat layer exposed Non-pressure chronic ulcer of other part of left lower leg with fat layer exposed Chronic venous hypertension (idiopathic) with ulcer and inflammation of bilateral lower extremity Peripheral vascular disease, unspecified Procedures Wound #1 Pre-procedure diagnosis of Wound #1 is a Venous Leg Ulcer located on the Right,Lateral Lower Leg .Severity of Tissue Pre Debridement is: Fat layer exposed. There was a Excisional Skin/Subcutaneous Tissue Debridement with a total area of 2.28 sq cm  performed by Duanne Guess, MD. With the following instrument(s): Curette to remove Viable and Non-Viable tissue/material. Material removed includes Subcutaneous Tissue and Slough and after achieving pain control using Lidocaine 4% T opical Solution. No specimens were taken. A time out was conducted at 10:20, prior to the start of the procedure. A Minimum amount of bleeding was controlled with Pressure. The procedure was tolerated well. Post Debridement Measurements: 2.9cm length x 1cm width x 0.1cm depth; 0.228cm^3 volume. Character of Wound/Ulcer Post Debridement is improved. Severity of Tissue Post Debridement is: Fat layer exposed. Post procedure Diagnosis Wound #1: Same as Pre-Procedure Pre-procedure diagnosis of Wound #1 is a Venous Leg Ulcer located on the Right,Lateral Lower Leg . There was a Three Layer Compression Therapy Procedure by Karie Schwalbe, RN. Post procedure Diagnosis Wound #1: Same as Pre-Procedure Notes: URGO K2 Lites. Wound #2 Pre-procedure diagnosis of Wound #2 is a Venous Leg Ulcer located on the Left,Lateral Lower Leg .Severity of Tissue Pre Debridement is: Fat layer exposed. There was a Excisional Skin/Subcutaneous Tissue Debridement with a total area of 0.75 sq cm performed by  Duanne Guess, MD. With the following instrument(s): Curette to remove Viable and Non-Viable tissue/material. Material removed includes Subcutaneous Tissue and Slough and after achieving pain control using Lidocaine 4% T opical Solution. No specimens were taken. A time out was conducted at 10:20, prior to the start of the procedure. A Minimum amount of bleeding was controlled with Pressure. The procedure was tolerated well. Post Debridement Measurements: 1.9cm length x 0.5cm width x 0.1cm depth; 0.075cm^3 volume. Character of Wound/Ulcer Post Debridement is improved. Severity of Tissue Post Debridement is: Fat layer exposed. Post procedure Diagnosis Wound #2: Same as  Pre-Procedure Pre-procedure diagnosis of Wound #2 is a Venous Leg Ulcer located on the Left,Lateral Lower Leg . There was a Three Layer Compression Therapy Procedure by Karie Schwalbe, RN. Post procedure Diagnosis Wound #2: Same as Pre-Procedure Notes: URGO K2 Lites. [name], [name] ([id number]) [id number] Page 9 of 10 Plan Follow-up Appointments: Return Appointment in 1 week. - Dr. Lady Gary 03/28/23 at 8:45am Return Appointment in 2 weeks. - Dr. Lady Gary Please schedule with front desk Return appointment in 3 weeks. - Dr. Lady Gary Please schedule with front desk Anesthetic: (In clinic) Topical Lidocaine 4% applied to wound bed Bathing/ Shower/ Hygiene: May shower with protection but do not get wound dressing(s) wet. Protect dressing(s) with water repellant cover (for example, large plastic bag) or a cast cover and may then take shower. Edema Control - Orders / Instructions: Elevate legs to the level of the heart or above for 30 minutes daily and/or when sitting for 3-4 times a day throughout the day. Avoid standing for long periods of time. Exercise regularly Additional Orders / Instructions: Follow Nutritious Diet - vitamin C 500 mg start once a day and if tolerated then twice a day, zinc 30-50 mg once daily, protein shakes daily WOUND #1: - Lower Leg Wound Laterality: Right, Lateral Cleanser: Soap and Water 1 x Per Week/30 Days Discharge Instructions: May shower and wash wound with dial antibacterial soap and water prior to dressing change. Cleanser: Wound Cleanser 1 x Per Week/30 Days Discharge Instructions: Cleanse the wound with wound cleanser prior to applying a clean dressing using gauze sponges, not tissue or cotton balls. Peri-Wound Care: Sween Lotion (Moisturizing lotion) 1 x Per Week/30 Days Discharge Instructions: Apply moisturizing lotion as directed Topical: Gentamicin 1 x Per Week/30 Days Discharge Instructions: As directed by physician Topical:  Mupirocin Ointment 1 x Per Week/30 Days Discharge Instructions: Apply Mupirocin (Bactroban) as instructed Prim Dressing: Hydrofera Blue Ready Transfer Foam, 2.5x2.5 (in/in) 1 x Per Week/30 Days ary Discharge Instructions: Apply directly to wound bed as directed Secondary Dressing: ABD Pad, 5x9 1 x Per Week/30 Days Discharge Instructions: Apply over primary dressing as directed. Com pression Wrap: Urgo K2 Lite, (equivalent to a 3 layer) two layer compression system, regular 1 x Per Week/30 Days Discharge Instructions: Apply Urgo K2 Lite as directed (alternative to 3 layer compression). WOUND #2: - Lower Leg Wound Laterality: Left, Lateral Cleanser: Soap and Water 1 x Per Week/30 Days Discharge Instructions: May shower and wash wound with dial antibacterial soap and water prior to dressing change. Cleanser: Wound Cleanser 1 x Per Week/30 Days Discharge Instructions: Cleanse the wound with wound cleanser prior to applying a clean dressing using gauze sponges, not tissue or cotton balls. Peri-Wound Care: Sween Lotion (Moisturizing lotion) 1 x Per Week/30 Days Discharge Instructions: Apply moisturizing lotion as directed Topical: Gentamicin 1 x Per Week/30 Days Discharge Instructions: As directed by physician Topical: Mupirocin Ointment 1 x Per Week/30 Days Discharge Instructions:  Apply Mupirocin (Bactroban) as instructed Prim Dressing: Hydrofera Blue Ready Transfer Foam, 2.5x2.5 (in/in) 1 x Per Week/30 Days ary Discharge Instructions: Apply directly to wound bed as directed Secondary Dressing: ABD Pad, 5x9 1 x Per Week/30 Days Discharge Instructions: Apply over primary dressing as directed. Com pression Wrap: Urgo K2 Lite, (equivalent to a 3 layer) two layer compression system, regular 1 x Per Week/30 Days Discharge Instructions: Apply Urgo K2 Lite as directed (alternative to 3 layer compression). 03/18/2023: Both wounds measured smaller today. There is an enlarging bud of epithelialized  tissue in the center of the right leg wound. There is slough on the surface of both. Edema control is good. I used a curette to debride slough and subcutaneous tissue off of each of his wounds. We will continue to use topical gentamicin and mupirocin for suppression of normal skin flora that seem to have been inhibiting his wound healing, continue Hydrofera Blue and bilateral Urgo lite compression wraps. Follow-up in 1 week. Electronic Signature(s) Signed: 03/18/2023 10:39:19 AM By: Duanne Guess MD FACS Entered By: Duanne Guess on 03/18/2023 10:38:25 -------------------------------------------------------------------------------- SuperBill Details Patient Name: Date of Service: [name], RO [name] 03/18/2023 Medical Record Number: [id number] Patient Account Number: [id number] Date of Birth/Sex: Treating RN: [birth year] (67 y.o. M) Primary Care Provider: Lynnea Ferrier Other Clinician: Referring Provider: Treating Provider/Extender: [name] in Treatment: [address] [place] ([id number]) [id number] Page 10 of 10 Diagnosis Coding ICD-10 Codes Code Description [id number] Non-pressure chronic ulcer of other part of right lower leg with fat layer exposed L97.822 Non-pressure chronic ulcer of other part of left lower leg with fat layer exposed I87.333 Chronic venous hypertension (idiopathic) with ulcer and inflammation of bilateral lower extremity I73.9 Peripheral vascular disease, unspecified Facility Procedures : CPT4 Code: [id number] Description: 11042 - DEB SUBQ TISSUE 20 SQ CM/< ICD-10 Diagnosis Description L97.812 Non-pressure chronic ulcer of other part of right lower leg with fat layer exp Y30.160 Non-pressure chronic ulcer of other part of left lower leg with fat layer expo Modifier: osed sed Quantity: 1 Physician Procedures : CPT4 Code Description Modifier [id number] 99214 - WC PHYS LEVEL 4 - EST PT ICD-10 Diagnosis Description  L97.812 Non-pressure chronic ulcer of other part of right lower leg with fat layer exposed L97.822 Non-pressure chronic ulcer of other part of left  lower leg with fat layer exposed I87.333 Chronic venous hypertension (idiopathic) with ulcer and inflammation of bilateral lower extremity I73.9 Peripheral vascular disease, unspecified Quantity: 1 : [id number] 11042 - WC PHYS SUBQ TISS 20 SQ CM ICD-10 Diagnosis Description L97.812 Non-pressure chronic ulcer of other part of right lower leg with fat layer exposed L97.822 Non-pressure chronic ulcer of other part of left lower leg with fat layer exposed Quantity: 1 Electronic Signature(s) Signed: 03/18/2023 10:39:19 AM By: Duanne Guess MD FACS Entered By: Duanne Guess on 03/18/2023 10:38:43

## 2023-03-28 ENCOUNTER — Encounter (HOSPITAL_BASED_OUTPATIENT_CLINIC_OR_DEPARTMENT_OTHER): Payer: Medicare Other | Admitting: General Surgery

## 2023-03-28 DIAGNOSIS — I87333 Chronic venous hypertension (idiopathic) with ulcer and inflammation of bilateral lower extremity: Secondary | ICD-10-CM | POA: Diagnosis not present

## 2023-03-28 NOTE — Progress Notes (Signed)
PANFILO, SIBAJA ([id number]) [id number] Page 1 of 10 Visit Report for 03/28/2023 Chief Complaint Document Details Patient Name: Date of Service: [name] 03/28/2023 8:45 A M Medical Record Number: [id number] Patient Account Number: [id number] Date of Birth/Sex: Treating RN: 1955/10/24 (67 y.o. M) Primary Care Provider: Lynnea Ferrier Other Clinician: Referring Provider: Treating Provider/Extender: [name] in Treatment: 9 Information Obtained from: Patient Chief Complaint Patient presents for treatment of open ulcers due to venous insufficiency Electronic Signature(s) Signed: 03/28/2023 12:24:20 PM By: Duanne Guess MD FACS Entered By: Duanne Guess on 03/28/2023 07:30:43 -------------------------------------------------------------------------------- Debridement Details Patient Name: Date of Service: [name], [name] 03/28/2023 8:45 A M Medical Record Number: [id number] Patient Account Number: [id number] Date of Birth/Sex: Treating RN: 06-19-1955 (67 y.o. [name] Primary Care Provider: Lynnea Ferrier Other Clinician: Referring Provider: Treating Provider/Extender: [name] in Treatment: 9 Debridement Performed for Assessment: Wound #2 Left,Lateral Lower Leg Performed By: Physician Duanne Guess, MD The following information was scribed by: Zenaida Deed The information was scribed for: Duanne Guess Debridement Type: Debridement Severity of Tissue Pre Debridement: Fat layer exposed Level of Consciousness (Pre-procedure): Awake and Alert Pre-procedure Verification/Time Out Yes - 10:15 Taken: Start Time: 10:15 Pain Control: Lidocaine 4% T opical Solution Percent of Wound Bed Debrided: 100% T Area Debrided (cm): otal 0.63 Tissue and other material debrided: Viable, Non-Viable, Slough, Subcutaneous, Skin: Epidermis, Slough Level: Skin/Subcutaneous  Tissue Debridement Description: Excisional Instrument: Curette Bleeding: Minimum Hemostasis Achieved: Pressure Procedural Pain: 0 Post Procedural Pain: 0 Response to Treatment: Procedure was tolerated well Level of Consciousness (Post- Awake and Alert procedure): Post Debridement Measurements of Total Wound Length: (cm) 2 Width: (cm) 0.4 Depth: (cm) 0.1 Volume: (cm) 0.063 Bloodsworth, Pryce ([id number]) [id number] Page 2 of 10 Character of Wound/Ulcer Post Debridement: Improved Severity of Tissue Post Debridement: Fat layer exposed Post Procedure Diagnosis Same as Pre-procedure Electronic Signature(s) Signed: 03/28/2023 12:24:20 PM By: Duanne Guess MD FACS Signed: 03/28/2023 1:00:49 PM By: Zenaida Deed RN, BSN Entered By: Zenaida Deed on 03/28/2023 07:18:31 -------------------------------------------------------------------------------- Debridement Details Patient Name: Date of Service: [name], [name] 03/28/2023 8:45 A M Medical Record Number: [id number] Patient Account Number: [id number] Date of Birth/Sex: Treating RN: 10-29-1955 (67 y.o. [name] Primary Care Provider: Lynnea Ferrier Other Clinician: Referring Provider: Treating Provider/Extender: [name] in Treatment: 9 Debridement Performed for Assessment: Wound #1 Right,Lateral Lower Leg Performed By: Physician Duanne Guess, MD The following information was scribed by: Zenaida Deed The information was scribed for: Duanne Guess Debridement Type: Debridement Severity of Tissue Pre Debridement: Fat layer exposed Level of Consciousness (Pre-procedure): Awake and Alert Pre-procedure Verification/Time Out Yes - 10:15 Taken: Start Time: 10:15 Pain Control: Lidocaine 4% T opical Solution Percent of Wound Bed Debrided: 100% T Area Debrided (cm): otal 2.83 Tissue and other material debrided: Viable, Non-Viable, Slough, Subcutaneous,  Skin: Epidermis, Slough Level: Skin/Subcutaneous Tissue Debridement Description: Excisional Instrument: Curette Bleeding: Minimum Hemostasis Achieved: Pressure Procedural Pain: 0 Post Procedural Pain: 0 Response to Treatment: Procedure was tolerated well Level of Consciousness (Post- Awake and Alert procedure): Post Debridement Measurements of Total Wound Length: (cm) 3 Width: (cm) 1.2 Depth: (cm) 0.1 Volume: (cm) 0.283 Character of Wound/Ulcer Post Debridement: Improved Severity of Tissue Post Debridement: Fat layer exposed Post Procedure Diagnosis Same as Pre-procedure Electronic Signature(s) Signed: 03/28/2023 12:24:20 PM By: Duanne Guess MD FACS Signed: 03/28/2023 1:00:49 PM By: Zenaida Deed RN, BSN Entered By: Zenaida Deed on 03/28/2023 07:19:04 Pascuzzi,  Molly Maduro ([id number]) [id number] Page 3 of 10 -------------------------------------------------------------------------------- HPI Details Patient Name: Date of Service: [name] 03/28/2023 8:45 A M Medical Record Number: [id number] Patient Account Number: [id number] Date of Birth/Sex: Treating RN: 1956-02-09 (67 y.o. M) Primary Care Provider: Lynnea Ferrier Other Clinician: Referring Provider: Treating Provider/Extender: [name] in Treatment: 9 History of Present Illness HPI Description: ADMISSION 01/23/2023 ***VASCULAR STUDIES:*** ABIs 11/2021: +-------+-----------+-----------+------------+------------+ ABI/TBIT oday's ABIT oday's TBIPrevious ABIPrevious TBI +-------+-----------+-----------+------------+------------+ Right 1.08 0.35 0.82 0.29  +-------+-----------+-----------+------------+------------+ Left 0.97 0.27 0.88 0.27  +-------+-----------+-----------+------------+------------+ Venous reflux 03/2021 Venous Reflux Times +--------------+---------+------+-----------+------------+--------+ RIGHT Reflux  NoRefluxReflux TimeDiameter cmsComments    Yes     +--------------+---------+------+-----------+------------+--------+ CFV   yes  >1 second    +--------------+---------+------+-----------+------------+--------+ FV prox   yes  >1 second    +--------------+---------+------+-----------+------------+--------+ FV mid   yes  >1 second    +--------------+---------+------+-----------+------------+--------+ FV dist   yes  >1 second    +--------------+---------+------+-----------+------------+--------+ Popliteal   yes  >1 second    +--------------+---------+------+-----------+------------+--------+ GSV at SFJ   yes  >500 ms  0.736   +--------------+---------+------+-----------+------------+--------+ GSV prox thigh  yes  >500 ms  0.591   +--------------+---------+------+-----------+------------+--------+ GSV mid thigh   yes  >500 ms  0.591   +--------------+---------+------+-----------+------------+--------+ GSV dist thigh  yes  >500 ms  0.546   +--------------+---------+------+-----------+------------+--------+ GSV at knee no    0.592   +--------------+---------+------+-----------+------------+--------+ GSV prox calf     0.441   +--------------+---------+------+-----------+------------+--------+ GSV mid calf     0.461   +--------------+---------+------+-----------+------------+--------+ SSV Pop Fossa no    0.406   +--------------+---------+------+-----------+------------+--------+ SSV prox calf   yes  >500 ms  0.379   +--------------+---------+------+-----------+------------+--------+ SSV mid calf     0.379   +--------------+---------+------+-----------+------------+--------+ Summary: Right: - No evidence of deep vein thrombosis seen in the right lower extremity, from the common femoral through the popliteal veins. - No evidence of superficial venous thrombosis in the right  lower extremity. - Venous reflux is noted in the right common femoral vein. - Venous reflux is noted in the right sapheno-femoral junction. - Venous reflux is noted in the right greater saphenous vein in the thigh. - Venous reflux is noted in the right femoral vein. - Venous reflux is noted in the right popliteal vein. - Venous reflux is noted in the right short saphenous vein. This is a 67 year old smoker with a significant history of cardiac and peripheral vascular disease. He has had issues with venous ulceration on his right leg that have resolved with local care in the past, but the most recent wound has been present for a couple of months and has not healed despite treatment with Silvadene cream and home application of Unna boots. He also has developed 2 small ulcers on the left that have presented within the past couple of weeks. Of note, his right greater saphenous vein was harvested in 2023 for femoral above-knee popliteal bypass. He was referred here by his primary care provider for further evaluation and management of his lower extremity venous ulcers. RAMAN, BLYDEN ([id number]) [id number] Page 4 of 10 01/31/2023: The left medial lower leg wound is healed. The left lateral and right lateral lower leg wounds are both smaller. They have slough accumulation on their surfaces. Edema control is excellent. 02/05/2023: Both wounds are just slightly smaller. They are cleaner, however, with a bit of slough accumulation on the surfaces. Edema control remains good. 02/14/2023: The wounds are smaller again today. They have slough on the surface. Edema control is good. 02/19/2023:  The wounds measured about the same size today, but there is more epithelialization occurring in the midportion of the wounds. There is slough accumulation once again. Edema control remains good. 02/25/2023: The left lateral leg wound measures slightly longer today. Both wounds have more slough  on them this week then on prior occasions. Edema control is good. No erythema, induration, or malodor to suggest infection. 03/18/2023: Both wounds measured smaller today. There is an enlarging bud of epithelialized tissue in the center of the right leg wound. There is slough on the surface of both. Edema control is good. 03/28/2023: The wound measurements did not change today, but on visual inspection, both wounds appear narrower. There is slough accumulation on both surfaces. Good edema control bilaterally. Electronic Signature(s) Signed: 03/28/2023 12:24:20 PM By: Duanne Guess MD FACS Entered By: Duanne Guess on 03/28/2023 07:31:31 -------------------------------------------------------------------------------- Physical Exam Details Patient Name: Date of Service: [name], [name] 03/28/2023 8:45 A M Medical Record Number: [id number] Patient Account Number: [id number] Date of Birth/Sex: Treating RN: 1955-09-13 (67 y.o. M) Primary Care Provider: Lynnea Ferrier Other Clinician: Referring Provider: Treating Provider/Extender: [name] in Treatment: 9 Constitutional . . . . no acute distress. Respiratory Normal work of breathing on room air.. Notes 03/28/2023: The wound measurements did not change today, but on visual inspection, both wounds appear narrower. There is slough accumulation on both surfaces. Good edema control bilaterally. Electronic Signature(s) Signed: 03/28/2023 12:24:20 PM By: Duanne Guess MD FACS Entered By: Duanne Guess on 03/28/2023 07:32:09 -------------------------------------------------------------------------------- Physician Orders Details Patient Name: Date of Service: [name], [name] 03/28/2023 8:45 A M Medical Record Number: [id number] Patient Account Number: [id number] Date of Birth/Sex: Treating RN: [birth year] (67 y.o. [name] Primary Care Provider: Lynnea Ferrier Other Clinician: Referring  Provider: Treating Provider/Extender: [name] in Treatment: 9 The following information was scribed by: Zenaida Deed The information was scribed for: Duanne Guess Verbal / Phone Orders: No Diagnosis [address] FREDA, MENDOSA ([id number]) [id number] Page 5 of 10 ICD-10 Coding Code Description [phone] Non-pressure chronic ulcer of other part of right lower leg with fat layer exposed L97.822 Non-pressure chronic ulcer of other part of left lower leg with fat layer exposed I87.333 Chronic venous hypertension (idiopathic) with ulcer and inflammation of bilateral lower extremity I73.9 Peripheral vascular disease, unspecified Follow-up Appointments ppointment in 1 week. - Dr. Lady Gary 1/3 @ 10:00 am Return A Anesthetic (In clinic) Topical Lidocaine 4% applied to wound bed Bathing/ Shower/ Hygiene May shower with protection but do not get wound dressing(s) wet. Protect dressing(s) with water repellant cover (for example, large plastic bag) or a cast cover and may then take shower. Edema Control - Orders / Instructions Elevate legs to the level of the heart or above for 30 minutes daily and/or when sitting for 3-4 times a day throughout the day. Avoid standing for long periods of time. Exercise regularly Additional Orders / Instructions Follow Nutritious Diet - vitamin C 500 mg start once a day and if tolerated then twice a day, zinc 30-50 mg once daily, protein shakes daily Wound Treatment Wound #1 - Lower Leg Wound Laterality: Right, Lateral Cleanser: Soap and Water 1 x Per Week/30 Days Discharge Instructions: May shower and wash wound with dial antibacterial soap and water prior to dressing change. Cleanser: Wound Cleanser 1 x Per Week/30 Days Discharge Instructions: Cleanse the wound with wound cleanser prior to applying a clean dressing using gauze sponges, not tissue or cotton balls. Peri-Wound Care: Manuela Schwartz  Lotion  (Moisturizing lotion) 1 x Per Week/30 Days Discharge Instructions: Apply moisturizing lotion as directed Topical: Gentamicin 1 x Per Week/30 Days Discharge Instructions: As directed by physician Topical: Mupirocin Ointment 1 x Per Week/30 Days Discharge Instructions: Apply Mupirocin (Bactroban) as instructed Topical: Skintegrity Hydrogel 4 (oz) 1 x Per Week/30 Days Discharge Instructions: Apply hydrogel as directed Prim Dressing: Promogran Prisma Matrix, 4.34 (sq in) (silver collagen) 1 x Per Week/30 Days ary Discharge Instructions: Moisten collagen with saline or hydrogel Secondary Dressing: Woven Gauze Sponge, Non-Sterile 4x4 in 1 x Per Week/30 Days Discharge Instructions: Apply over primary dressing as directed. Compression Wrap: Urgo K2 Lite, (equivalent to a 3 layer) two layer compression system, regular 1 x Per Week/30 Days Discharge Instructions: Apply Urgo K2 Lite as directed (alternative to 3 layer compression). Wound #2 - Lower Leg Wound Laterality: Left, Lateral Cleanser: Soap and Water 1 x Per Week/30 Days Discharge Instructions: May shower and wash wound with dial antibacterial soap and water prior to dressing change. Cleanser: Wound Cleanser 1 x Per Week/30 Days Discharge Instructions: Cleanse the wound with wound cleanser prior to applying a clean dressing using gauze sponges, not tissue or cotton balls. Peri-Wound Care: Sween Lotion (Moisturizing lotion) 1 x Per Week/30 Days Discharge Instructions: Apply moisturizing lotion as directed Topical: Gentamicin 1 x Per Week/30 Days Discharge Instructions: As directed by physician Topical: Mupirocin Ointment 1 x Per Week/30 Days Discharge Instructions: Apply Mupirocin (Bactroban) as instructed Topical: Skintegrity Hydrogel 4 (oz) 1 x Per Week/30 Days Discharge Instructions: Apply hydrogel as directed Prim Dressing: Promogran Prisma Matrix, 4.34 (sq in) (silver collagen) 1 x Per Week/30 Days ary Discharge Instructions: Moisten  collagen with saline or hydrogel ADIYAN, STOGDILL ([id number]) [id number] Page 6 of 10 Secondary Dressing: Woven Gauze Sponge, Non-Sterile 4x4 in 1 x Per Week/30 Days Discharge Instructions: Apply over primary dressing as directed. Compression Wrap: Urgo K2 Lite, (equivalent to a 3 layer) two layer compression system, regular 1 x Per Week/30 Days Discharge Instructions: Apply Urgo K2 Lite as directed (alternative to 3 layer compression). Electronic Signature(s) Signed: 03/28/2023 12:24:20 PM By: Duanne Guess MD FACS Entered By: Duanne Guess on 03/28/2023 07:32:26 -------------------------------------------------------------------------------- Problem List Details Patient Name: Date of Service: [name], [name] 03/28/2023 8:45 A M Medical Record Number: [id number] Patient Account Number: [id number] Date of Birth/Sex: Treating RN: [birth year] (67 y.o. [name] Primary Care Provider: Lynnea Ferrier Other Clinician: Referring Provider: Treating Provider/Extender: [name] in Treatment: 9 Active Problems ICD-10 Encounter Code Description Active Date MDM Diagnosis L97.812 Non-pressure chronic ulcer of other part of right lower leg with fat layer 01/23/2023 No Yes exposed L97.822 Non-pressure chronic ulcer of other part of left lower leg with fat layer 01/23/2023 No Yes exposed I87.333 Chronic venous hypertension (idiopathic) with ulcer and inflammation of 01/23/2023 No Yes bilateral lower extremity I73.9 Peripheral vascular disease, unspecified 01/23/2023 No Yes Inactive Problems Resolved Problems Electronic Signature(s) Signed: 03/28/2023 12:24:20 PM By: Duanne Guess MD FACS Entered By: Duanne Guess on 03/28/2023 07:29:45 -------------------------------------------------------------------------------- Progress Note Details Patient Name: Date of Service: [name], [name] 03/28/2023 8:45 A M Medical  Record Number: [id number] Patient Account Number: [id number] RANIEL, DIGGES ([id number]) [id number] Page 7 of 10 Date of Birth/Sex: Treating RN: March 31, 1956 (67 y.o. M) Primary Care Provider: Other Clinician: Lynnea Ferrier Referring Provider: Treating Provider/Extender: [name] in Treatment: 9 Subjective Chief Complaint Information obtained from Patient Patient presents for treatment of open ulcers due to venous insufficiency History  of Present Illness (HPI) ADMISSION 01/23/2023 ***VASCULAR STUDIES:*** ABIs 11/2021: +-------+-----------+-----------+------------+------------+ ABI/TBIT oday's ABIT oday's TBIPrevious ABIPrevious TBI +-------+-----------+-----------+------------+------------+ Right 1.08 0.35 0.82 0.29  +-------+-----------+-----------+------------+------------+ Left 0.97 0.27 0.88 0.27  +-------+-----------+-----------+------------+------------+ Venous reflux 03/2021 Venous Reflux Times +--------------+---------+------+-----------+------------+--------+ RIGHT Reflux NoRefluxReflux TimeDiameter cmsComments    Yes     +--------------+---------+------+-----------+------------+--------+ CFV   yes  >1 second    +--------------+---------+------+-----------+------------+--------+ FV prox   yes  >1 second    +--------------+---------+------+-----------+------------+--------+ FV mid   yes  >1 second    +--------------+---------+------+-----------+------------+--------+ FV dist   yes  >1 second    +--------------+---------+------+-----------+------------+--------+ Popliteal   yes  >1 second    +--------------+---------+------+-----------+------------+--------+ GSV at SFJ   yes  >500 ms  0.736   +--------------+---------+------+-----------+------------+--------+ GSV prox thigh  yes  >500 ms  0.591    +--------------+---------+------+-----------+------------+--------+ GSV mid thigh   yes  >500 ms  0.591   +--------------+---------+------+-----------+------------+--------+ GSV dist thigh  yes  >500 ms  0.546   +--------------+---------+------+-----------+------------+--------+ GSV at knee no    0.592   +--------------+---------+------+-----------+------------+--------+ GSV prox calf     0.441   +--------------+---------+------+-----------+------------+--------+ GSV mid calf     0.461   +--------------+---------+------+-----------+------------+--------+ SSV Pop Fossa no    0.406   +--------------+---------+------+-----------+------------+--------+ SSV prox calf   yes  >500 ms  0.379   +--------------+---------+------+-----------+------------+--------+ SSV mid calf     0.379   +--------------+---------+------+-----------+------------+--------+ Summary: Right: - No evidence of deep vein thrombosis seen in the right lower extremity, from the common femoral through the popliteal veins. - No evidence of superficial venous thrombosis in the right lower extremity. - Venous reflux is noted in the right common femoral vein. - Venous reflux is noted in the right sapheno-femoral junction. - Venous reflux is noted in the right greater saphenous vein in the thigh. - Venous reflux is noted in the right femoral vein. - Venous reflux is noted in the right popliteal vein. - Venous reflux is noted in the right short saphenous vein. This is a 67 year old smoker with a significant history of cardiac and peripheral vascular disease. He has had issues with venous ulceration on his right leg that have resolved with local care in the past, but the most recent wound has been present for a couple of months and has not healed despite treatment with Silvadene cream and home application of Unna boots. He also has developed 2 small ulcers on the left that  have presented within the past couple of weeks. Of note, his right greater saphenous vein was harvested in 2023 for femoral above-knee popliteal bypass. He was referred here by his primary care provider for further evaluation and management of his lower extremity venous ulcers. 01/31/2023: The left medial lower leg wound is healed. The left lateral and right lateral lower leg wounds are both smaller. They have slough accumulation on their surfaces. Edema control is excellent. 02/05/2023: Both wounds are just slightly smaller. They are cleaner, however, with a bit of slough accumulation on the surfaces. Edema control remains good. 02/14/2023: The wounds are smaller again today. They have slough on the surface. Edema control is good. DAVONTA, POSTHUMA ([id number]) [id number] Page 8 of 10 02/19/2023: The wounds measured about the same size today, but there is more epithelialization occurring in the midportion of the wounds. There is slough accumulation once again. Edema control remains good. 02/25/2023: The left lateral leg wound measures slightly longer today. Both wounds have more slough on them this week then on prior occasions. Edema control is good. No erythema, induration, or malodor  to suggest infection. 03/18/2023: Both wounds measured smaller today. There is an enlarging bud of epithelialized tissue in the center of the right leg wound. There is slough on the surface of both. Edema control is good. 03/28/2023: The wound measurements did not change today, but on visual inspection, both wounds appear narrower. There is slough accumulation on both surfaces. Good edema control bilaterally. Objective Constitutional no acute distress. Vitals Time Taken: 9:08 AM, Height: 68 in, Weight: 208 lbs, BMI: 31.6, Temperature: 98.3 F, Pulse: 81 bpm, Respiratory Rate: 20 breaths/min, Blood Pressure: 132/78 mmHg. Respiratory Normal work of breathing on room air.. General Notes:  03/28/2023: The wound measurements did not change today, but on visual inspection, both wounds appear narrower. There is slough accumulation on both surfaces. Good edema control bilaterally. Integumentary (Hair, Skin) Wound #1 status is Open. Original cause of wound was Gradually Appeared. The date acquired was: 10/31/2022. The wound has been in treatment 9 weeks. The wound is located on the Right,Lateral Lower Leg. The wound measures 3cm length x 1.2cm width x 0.1cm depth; 2.827cm^2 area and 0.283cm^3 volume. There is Fat Layer (Subcutaneous Tissue) exposed. There is no undermining noted. There is a medium amount of serosanguineous drainage noted. The wound margin is distinct with the outline attached to the wound base. There is large (67-100%) red granulation within the wound bed. There is a small (1-33%) amount of necrotic tissue within the wound bed including Adherent Slough. The periwound skin appearance had no abnormalities noted for moisture. The periwound skin appearance exhibited: Scarring, Hemosiderin Staining. The periwound skin appearance did not exhibit: Callus, Crepitus, Excoriation, Induration, Rash, Atrophie Blanche, Cyanosis, Ecchymosis, Mottled, Pallor, Rubor, Erythema. Periwound temperature was noted as No Abnormality. Wound #2 status is Open. Original cause of wound was Gradually Appeared. The date acquired was: 01/13/2023. The wound has been in treatment 9 weeks. The wound is located on the Left,Lateral Lower Leg. The wound measures 2cm length x 0.4cm width x 0.1cm depth; 0.628cm^2 area and 0.063cm^3 volume. There is Fat Layer (Subcutaneous Tissue) exposed. There is no tunneling or undermining noted. There is a medium amount of serosanguineous drainage noted. The wound margin is distinct with the outline attached to the wound base. There is small (1-33%) red granulation within the wound bed. There is a large (67-100%) amount of necrotic tissue within the wound bed including Adherent  Slough. The periwound skin appearance had no abnormalities noted for moisture. The periwound skin appearance exhibited: Scarring, Hemosiderin Staining. The periwound skin appearance did not exhibit: Callus, Crepitus, Excoriation, Induration, Rash, Atrophie Blanche, Cyanosis, Ecchymosis, Mottled, Pallor, Rubor, Erythema. Periwound temperature was noted as No Abnormality. Assessment Active Problems ICD-10 Non-pressure chronic ulcer of other part of right lower leg with fat layer exposed Non-pressure chronic ulcer of other part of left lower leg with fat layer exposed Chronic venous hypertension (idiopathic) with ulcer and inflammation of bilateral lower extremity Peripheral vascular disease, unspecified Procedures Wound #1 Pre-procedure diagnosis of Wound #1 is a Venous Leg Ulcer located on the Right,Lateral Lower Leg .Severity of Tissue Pre Debridement is: Fat layer exposed. There was a Excisional Skin/Subcutaneous Tissue Debridement with a total area of 2.83 sq cm performed by Duanne Guess, MD. With the following instrument(s): Curette to remove Viable and Non-Viable tissue/material. Material removed includes Subcutaneous Tissue, Slough, and Skin: Epidermis after achieving pain control using Lidocaine 4% Topical Solution. No specimens were taken. A time out was conducted at 10:15, prior to the start of the procedure. A Minimum amount of bleeding was controlled  with Pressure. The procedure was tolerated well with a pain level of 0 throughout and a pain level of 0 following the procedure. Post Debridement Measurements: 3cm length x 1.2cm width x 0.1cm depth; 0.283cm^3 volume. Character of Wound/Ulcer Post Debridement is improved. Severity of Tissue Post Debridement is: Fat layer exposed. Post procedure Diagnosis Wound #1: Same as Pre-Procedure Pre-procedure diagnosis of Wound #1 is a Venous Leg Ulcer located on the Right,Lateral Lower Leg . There was a Double Layer Compression  Therapy Procedure by Zenaida Deed, RN. Post procedure Diagnosis Wound #1: Same as Pre-Procedure TORREON, MUSICK ([id number]) [id number] Page 9 of 10 Notes: URGO lite. Wound #2 Pre-procedure diagnosis of Wound #2 is a Venous Leg Ulcer located on the Left,Lateral Lower Leg .Severity of Tissue Pre Debridement is: Fat layer exposed. There was a Excisional Skin/Subcutaneous Tissue Debridement with a total area of 0.63 sq cm performed by Duanne Guess, MD. With the following instrument(s): Curette to remove Viable and Non-Viable tissue/material. Material removed includes Subcutaneous Tissue, Slough, and Skin: Epidermis after achieving pain control using Lidocaine 4% Topical Solution. No specimens were taken. A time out was conducted at 10:15, prior to the start of the procedure. A Minimum amount of bleeding was controlled with Pressure. The procedure was tolerated well with a pain level of 0 throughout and a pain level of 0 following the procedure. Post Debridement Measurements: 2cm length x 0.4cm width x 0.1cm depth; 0.063cm^3 volume. Character of Wound/Ulcer Post Debridement is improved. Severity of Tissue Post Debridement is: Fat layer exposed. Post procedure Diagnosis Wound #2: Same as Pre-Procedure Pre-procedure diagnosis of Wound #2 is a Venous Leg Ulcer located on the Left,Lateral Lower Leg . There was a Double Layer Compression Therapy Procedure by Zenaida Deed, RN. Post procedure Diagnosis Wound #2: Same as Pre-Procedure Notes: URGO lite. Plan Follow-up Appointments: Return Appointment in 1 week. - Dr. Lady Gary 1/3 @ 10:00 am Anesthetic: (In clinic) Topical Lidocaine 4% applied to wound bed Bathing/ Shower/ Hygiene: May shower with protection but do not get wound dressing(s) wet. Protect dressing(s) with water repellant cover (for example, large plastic bag) or a cast cover and may then take shower. Edema Control - Orders / Instructions: Elevate legs to  the level of the heart or above for 30 minutes daily and/or when sitting for 3-4 times a day throughout the day. Avoid standing for long periods of time. Exercise regularly Additional Orders / Instructions: Follow Nutritious Diet - vitamin C 500 mg start once a day and if tolerated then twice a day, zinc 30-50 mg once daily, protein shakes daily WOUND #1: - Lower Leg Wound Laterality: Right, Lateral Cleanser: Soap and Water 1 x Per Week/30 Days Discharge Instructions: May shower and wash wound with dial antibacterial soap and water prior to dressing change. Cleanser: Wound Cleanser 1 x Per Week/30 Days Discharge Instructions: Cleanse the wound with wound cleanser prior to applying a clean dressing using gauze sponges, not tissue or cotton balls. Peri-Wound Care: Sween Lotion (Moisturizing lotion) 1 x Per Week/30 Days Discharge Instructions: Apply moisturizing lotion as directed Topical: Gentamicin 1 x Per Week/30 Days Discharge Instructions: As directed by physician Topical: Mupirocin Ointment 1 x Per Week/30 Days Discharge Instructions: Apply Mupirocin (Bactroban) as instructed Topical: Skintegrity Hydrogel 4 (oz) 1 x Per Week/30 Days Discharge Instructions: Apply hydrogel as directed Prim Dressing: Promogran Prisma Matrix, 4.34 (sq in) (silver collagen) 1 x Per Week/30 Days ary Discharge Instructions: Moisten collagen with saline or hydrogel Secondary Dressing: Woven Gauze Sponge, Non-Sterile 4x4 in  1 x Per Week/30 Days Discharge Instructions: Apply over primary dressing as directed. Com pression Wrap: Urgo K2 Lite, (equivalent to a 3 layer) two layer compression system, regular 1 x Per Week/30 Days Discharge Instructions: Apply Urgo K2 Lite as directed (alternative to 3 layer compression). WOUND #2: - Lower Leg Wound Laterality: Left, Lateral Cleanser: Soap and Water 1 x Per Week/30 Days Discharge Instructions: May shower and wash wound with dial antibacterial soap and water prior to  dressing change. Cleanser: Wound Cleanser 1 x Per Week/30 Days Discharge Instructions: Cleanse the wound with wound cleanser prior to applying a clean dressing using gauze sponges, not tissue or cotton balls. Peri-Wound Care: Sween Lotion (Moisturizing lotion) 1 x Per Week/30 Days Discharge Instructions: Apply moisturizing lotion as directed Topical: Gentamicin 1 x Per Week/30 Days Discharge Instructions: As directed by physician Topical: Mupirocin Ointment 1 x Per Week/30 Days Discharge Instructions: Apply Mupirocin (Bactroban) as instructed Topical: Skintegrity Hydrogel 4 (oz) 1 x Per Week/30 Days Discharge Instructions: Apply hydrogel as directed Prim Dressing: Promogran Prisma Matrix, 4.34 (sq in) (silver collagen) 1 x Per Week/30 Days ary Discharge Instructions: Moisten collagen with saline or hydrogel Secondary Dressing: Woven Gauze Sponge, Non-Sterile 4x4 in 1 x Per Week/30 Days Discharge Instructions: Apply over primary dressing as directed. Com pression Wrap: Urgo K2 Lite, (equivalent to a 3 layer) two layer compression system, regular 1 x Per Week/30 Days Discharge Instructions: Apply Urgo K2 Lite as directed (alternative to 3 layer compression). 03/28/2023: The wound measurements did not change today, but on visual inspection, both wounds appear narrower. There is slough accumulation on both surfaces. Good edema control bilaterally. I used a curette to debride slough and subcutaneous tissue from both wounds. Now that they are quite a bit cleaner, they do have a little bit of the fibrotic surface so I am going to change the contact layer to Prisma silver collagen moistened with hydrogel. We will continue the topical gentamicin and mupirocin, as we have dealt with infection previously. Continue bilateral Urgo lite compression wraps. Follow-up in 1 week. Electronic Signature(s) CHEE, MEASEL ([id number]) [id number] Page 10 of 10 Signed: 03/28/2023  12:24:20 PM By: Duanne Guess MD FACS Entered By: Duanne Guess on 03/28/2023 07:34:00 -------------------------------------------------------------------------------- SuperBill Details Patient Name: Date of Service: [name], [name] 03/28/2023 Medical Record Number: [id number] Patient Account Number: [id number] Date of Birth/Sex: Treating RN: 1955-06-29 (67 y.o. M) Primary Care Provider: Lynnea Ferrier Other Clinician: Referring Provider: Treating Provider/Extender: [name] in Treatment: 9 Diagnosis Coding ICD-10 Codes Code Description [phone] Non-pressure chronic ulcer of other part of right lower leg with fat layer exposed L97.822 Non-pressure chronic ulcer of other part of left lower leg with fat layer exposed I87.333 Chronic venous hypertension (idiopathic) with ulcer and inflammation of bilateral lower extremity I73.9 Peripheral vascular disease, unspecified Facility Procedures : CPT4 Code: [id number] Description: 11042 - DEB SUBQ TISSUE 20 SQ CM/< ICD-10 Diagnosis Description L97.812 Non-pressure chronic ulcer of other part of right lower leg with fat layer exp L97.822 Non-pressure chronic ulcer of other part of left lower leg with fat layer expo Modifier: osed sed Quantity: 1 Physician Procedures : CPT4 Code Description Modifier [id number] - WC PHYS LEVEL 4 - EST PT ICD-10 Diagnosis Description L97.812 Non-pressure chronic ulcer of other part of right lower leg with fat layer exposed L97.822 Non-pressure chronic ulcer of other part of left  lower leg with fat layer exposed I87.333 Chronic venous hypertension (idiopathic) with ulcer and inflammation of bilateral lower  extremity I73.9 Peripheral vascular disease, unspecified Quantity: 1 : [id number] 11042 - WC PHYS SUBQ TISS 20 SQ CM ICD-10 Diagnosis Description L97.812 Non-pressure chronic ulcer of other part of right lower leg with fat layer exposed L97.822 Non-pressure chronic ulcer of other  part of left lower leg with fat layer exposed Quantity: 1 Electronic Signature(s) Signed: 03/28/2023 12:24:20 PM By: Duanne Guess MD FACS Entered By: Duanne Guess on 03/28/2023 07:34:23

## 2023-03-28 NOTE — Progress Notes (Signed)
TRADD, SERGI ([id number]) [id number] Page 1 of 10 Visit Report for 03/28/2023 Arrival Information Details Patient Name: Date of Service: [name] 03/28/2023 8:45 A M Medical Record Number: [id number] Patient Account Number: [id number] Date of Birth/Sex: Treating RN: Aug 03, 1955 (67 y.o. [name], [name].[name] Primary Care [name]: [name] Other Clinician: Referring [name]: Treating [name]/Extender: [name] in Treatment: 9 Visit Information History Since Last Visit Added or deleted any medications: No Patient Arrived: Ambulatory Any new allergies or adverse reactions: No Arrival Time: 09:04 Had a fall or experienced change in No Accompanied By: self activities of daily living that may affect Transfer Assistance: None risk of falls: Patient Identification Verified: Yes Signs or symptoms of abuse/neglect since last visito No Secondary Verification Process Completed: Yes Hospitalized since last visit: No Patient Requires Transmission-Based Precautions: No Implantable device outside of the clinic excluding No Patient Has Alerts: No cellular tissue based products placed in the center since last visit: Has Dressing in Place as Prescribed: Yes Has Compression in Place as Prescribed: Yes Pain Present Now: No Electronic Signature(s) Signed: 03/28/2023 12:57:00 PM By: Shawn Stall RN, BSN Entered By: Shawn Stall on 03/28/2023 06:04:53 -------------------------------------------------------------------------------- Compression Therapy Details Patient Name: Date of Service: [name], [name] 03/28/2023 8:45 A M Medical Record Number: [id number] Patient Account Number: [id number] Date of Birth/Sex: Treating RN: 09/25/1955 (67 y.o. [name] Primary Care Jamerion Cabello: [name] Other Clinician: Referring [name]: Treating [name]/Extender: [name] in Treatment:  9 Compression Therapy Performed for Wound Assessment: Wound #1 Right,Lateral Lower Leg Performed By: Clinician Zenaida Deed, RN Compression Type: Double Layer Post Procedure Diagnosis Same as Pre-procedure Notes URGO lite Electronic Signature(s) Signed: 03/28/2023 1:00:49 PM By: Zenaida Deed RN, BSN Entered By: Zenaida Deed on 03/28/2023 07:17:03 [name] ([id number]) [id number] Page 2 of 10 -------------------------------------------------------------------------------- Compression Therapy Details Patient Name: Date of Service: [name] 03/28/2023 8:45 A M Medical Record Number: [id number] Patient Account Number: [id number] Date of Birth/Sex: Treating RN: 1955/10/26 (67 y.o. [name] Primary Care Panagiotis Oelkers: [name] Other Clinician: Referring Jeanice Dempsey: Treating Airianna Kreischer/Extender: [name] in Treatment: 9 Compression Therapy Performed for Wound Assessment: Wound #2 Left,Lateral Lower Leg Performed By: Clinician Zenaida Deed, RN Compression Type: Double Layer Post Procedure Diagnosis Same as Pre-procedure Notes URGO lite Electronic Signature(s) Signed: 03/28/2023 1:00:49 PM By: Zenaida Deed RN, BSN Entered By: Zenaida Deed on 03/28/2023 07:17:03 -------------------------------------------------------------------------------- Encounter Discharge Information Details Patient Name: Date of Service: [name], [name] 03/28/2023 8:45 A M Medical Record Number: [id number] Patient Account Number: [id number] Date of Birth/Sex: Treating RN: [birth year] (67 y.o. [name] Primary Care [name]: [name] Other Clinician: Referring Annajulia Lewing: Treating [name]/Extender: [name] in Treatment: 9 Encounter Discharge Information Items Post Procedure Vitals Discharge Condition: Stable Temperature (F): 98.3 Ambulatory Status: Ambulatory Pulse  (bpm): 81 Discharge Destination: Home Respiratory Rate (breaths/min): 20 Transportation: Private Auto Blood Pressure (mmHg): 132/78 Accompanied By: self Schedule Follow-up Appointment: Yes Clinical Summary of Care: Electronic Signature(s) Signed: 03/28/2023 12:57:00 PM By: Shawn Stall RN, BSN Entered By: Shawn Stall on 03/28/2023 09:31:57 -------------------------------------------------------------------------------- Lower Extremity Assessment Details Patient Name: Date of Service: [name], [name] 03/28/2023 8:45 A M Medical Record Number: [id number] Patient Account Number: [id number] Date of Birth/Sex: Treating RN: Aug 25, 1955 (67 y.o. [name] Primary Care [name]: [name] Other Clinician: Referring [name]: Treating [name]/Extender: [name] [place], [name] ([id number]) [id number] Page 3 of  10 Weeks in Treatment: 9 Edema Assessment Assessed: [Left: Yes] [Right: Yes] Edema: [Left: Yes] [Right: Yes] Calf Left: Right: Point of Measurement: From Medial Instep 41 cm 35.5 cm Ankle Left: Right: Point of Measurement: From Medial Instep 22 cm 23 cm Vascular Assessment Pulses: Dorsalis Pedis Palpable: [Left:Yes] [Right:Yes] Extremity colors, hair growth, and conditions: Extremity Color: [Left:Hyperpigmented] [Right:Hyperpigmented] Hair Growth on Extremity: [Left:No] [Right:No] Temperature of Extremity: [Left:Cool] [Right:Cool] Capillary Refill: [Left:< 3 seconds] [Right:< 3 seconds] Dependent Rubor: [Left:No] [Right:No] Blanched when Elevated: [Left:No Yes] [Right:No Yes] Toe Nail Assessment Left: Right: Thick: No No Discolored: No No Deformed: No No Improper Length and Hygiene: No No Electronic Signature(s) Signed: 03/28/2023 12:57:00 PM By: Shawn Stall RN, BSN Entered By: Shawn Stall on 03/28/2023 06:10:06 -------------------------------------------------------------------------------- Multi  Wound Chart Details Patient Name: Date of Service: [name], [name] 03/28/2023 8:45 A M Medical Record Number: [id number] Patient Account Number: [id number] Date of Birth/Sex: Treating RN: [birth year] (67 y.o. M) Primary Care Praneel Haisley: [name] Other Clinician: Referring Lael Pilch: Treating Mariangela Heldt/Extender: [name] in Treatment: 9 Vital Signs Height(in): 68 Pulse(bpm): 81 Weight(lbs): 208 Blood Pressure(mmHg): 132/78 Body Mass Index(BMI): 31.6 Temperature(F): 98.3 Respiratory Rate(breaths/min): 20 [1:Photos:] [N/A:N/A [id number] Page 4 of 10] Right, Lateral Lower Leg Left, Lateral Lower Leg N/A Wound Location: Gradually Appeared Gradually Appeared N/A Wounding Event: Venous Leg Ulcer Venous Leg Ulcer N/A Primary Etiology: Chronic Obstructive Pulmonary Chronic Obstructive Pulmonary N/A Comorbid History: Disease (COPD), Angina, Arrhythmia, Disease (COPD), Angina, Arrhythmia, Coronary Artery Disease, Coronary Artery Disease, Hypertension, Myocardial Infarction, Hypertension, Myocardial Infarction, Peripheral Venous Disease Peripheral Venous Disease 10/31/2022 01/13/2023 N/A Date Acquired: 9 9 N/A Weeks of Treatment: Open Open N/A Wound Status: No No N/A Wound Recurrence: 3x1.2x0.1 2x0.4x0.1 N/A Measurements L x W x D (cm) 2.827 0.628 N/A A (cm) : rea 0.283 0.063 N/A Volume (cm) : 70.00% 44.10% N/A % Reduction in A rea: 70.00% 43.80% N/A % Reduction in Volume: Full Thickness Without Exposed Full Thickness Without Exposed N/A Classification: Support Structures Support Structures Medium Medium N/A Exudate A mount: Serosanguineous Serosanguineous N/A Exudate Type: red, brown red, brown N/A Exudate Color: Distinct, outline attached Distinct, outline attached N/A Wound Margin: Large (67-100%) Small (1-33%) N/A Granulation A mount: Red Red N/A Granulation Quality: Small (1-33%) Large (67-100%)  N/A Necrotic A mount: Fat Layer (Subcutaneous Tissue): Yes Fat Layer (Subcutaneous Tissue): Yes N/A Exposed Structures: Fascia: No Tendon: No Muscle: No Joint: No Bone: No Small (1-33%) Small (1-33%) N/A Epithelialization: Debridement - Excisional Debridement - Excisional N/A Debridement: Pre-procedure Verification/Time Out 10:15 10:15 N/A Taken: Lidocaine 4% Topical Solution Lidocaine 4% Topical Solution N/A Pain Control: Subcutaneous, Slough Subcutaneous, Slough N/A Tissue Debrided: Skin/Subcutaneous Tissue Skin/Subcutaneous Tissue N/A Level: 2.83 0.63 N/A Debridement A (sq cm): rea Curette Curette N/A Instrument: Minimum Minimum N/A Bleeding: Pressure Pressure N/A Hemostasis A chieved: 0 0 N/A Procedural Pain: 0 0 N/A Post Procedural Pain: Procedure was tolerated well Procedure was tolerated well N/A Debridement Treatment Response: 3x1.2x0.1 2x0.4x0.1 N/A Post Debridement Measurements L x W x D (cm) 0.283 0.063 N/A Post Debridement Volume: (cm) Scarring: Yes Scarring: Yes N/A Periwound Skin Texture: Excoriation: No Excoriation: No Induration: No Induration: No Callus: No Callus: No Crepitus: No Crepitus: No Rash: No Rash: No Maceration: No Maceration: No N/A Periwound Skin Moisture: Dry/Scaly: No Dry/Scaly: No Hemosiderin Staining: Yes Hemosiderin Staining: Yes N/A Periwound Skin Color: Atrophie Blanche: No Atrophie Blanche: No Cyanosis: No Cyanosis: No Ecchymosis: No Ecchymosis: No Erythema: No Erythema: No Mottled: No Mottled: No  Pallor: No Pallor: No Rubor: No Rubor: No No Abnormality No Abnormality N/A Temperature: Compression Therapy Compression Therapy N/A Procedures Performed: Debridement Debridement Treatment Notes Electronic Signature(s) Signed: 03/28/2023 12:24:20 PM By: Duanne Guess MD FACS Entered By: Duanne Guess on 03/28/2023 07:30:37 [name] ([id number]) [id number] Page 5 of  10 -------------------------------------------------------------------------------- Multi-Disciplinary Care Plan Details Patient Name: Date of Service: [name] 03/28/2023 8:45 A M Medical Record Number: [id number] Patient Account Number: [id number] Date of Birth/Sex: Treating RN: [birth year] (67 y.o. [name] Primary Care Antoniette Peake: [name] Other Clinician: Referring Trenell Moxey: Treating Zaineb Nowaczyk/Extender: [name] in Treatment: 9 Multidisciplinary Care Plan reviewed with physician Active Inactive Venous Leg Ulcer Nursing Diagnoses: Actual venous Insuffiency (use after diagnosis is confirmed) Knowledge deficit related to disease process and management Goals: Patient will maintain optimal edema control Date Initiated: 01/23/2023 Target Resolution Date: 05/30/2023 Goal Status: Active Patient/caregiver will verbalize understanding of disease process and disease management Date Initiated: 01/23/2023 Target Resolution Date: 05/30/2023 Goal Status: Active Interventions: Assess peripheral edema status every visit. Compression as ordered Provide education on venous insufficiency Treatment Activities: Therapeutic compression applied : 01/23/2023 Notes: Wound/Skin Impairment Nursing Diagnoses: Impaired tissue integrity Knowledge deficit related to ulceration/compromised skin integrity Goals: Patient/caregiver will verbalize understanding of skin care regimen Date Initiated: 01/23/2023 Target Resolution Date: 05/30/2023 Goal Status: Active Interventions: Assess ulceration(s) every visit Treatment Activities: Skin care regimen initiated : 01/23/2023 Topical wound management initiated : 01/23/2023 Notes: Electronic Signature(s) Signed: 03/28/2023 1:00:49 PM By: Zenaida Deed RN, BSN Entered By: Zenaida Deed on 03/28/2023 09:43:26 [name] ([id number]) [id number] Page 6 of  10 -------------------------------------------------------------------------------- Pain Assessment Details Patient Name: Date of Service: [name] 03/28/2023 8:45 A M Medical Record Number: [id number] Patient Account Number: [id number] Date of Birth/Sex: Treating RN: 08/18/1955 (67 y.o. [name] Primary Care Viyaan Champine: [name] Other Clinician: Referring Jaslene Marsteller: Treating Aydrien Froman/Extender: [name] in Treatment: 9 Active Problems Location of Pain Severity and Description of Pain Patient Has Paino No Site Locations Rate the pain. Current Pain Level: 0 Pain Management and Medication Current Pain Management: Medication: No Cold Application: No Rest: No Massage: No Activity: No T.E.N.S.: No Heat Application: No Leg drop or elevation: No Is the Current Pain Management Adequate: Adequate How does your wound impact your activities of daily livingo Sleep: No Bathing: No Appetite: No Relationship With Others: No Bladder Continence: No Emotions: No Bowel Continence: No Work: No Toileting: No Drive: No Dressing: No Hobbies: No Notes per patient pain in the morning when he first wakes in feet and legs until he starts moving around. Electronic Signature(s) Signed: 03/28/2023 12:57:00 PM By: Shawn Stall RN, BSN Entered By: Shawn Stall on 03/28/2023 06:08:37 -------------------------------------------------------------------------------- Patient/Caregiver Education Details Patient Name: Date of Service: [name], [name] 12/27/2024andnbsp8:45 A M Medical Record Number: [id number] Patient Account Number: [id number] Date of Birth/Gender: Treating RN: April 15, 1955 (67 y.o. [name] Primary Care Physician: [name] Other Clinician: Referring Physician: Treating Physician/Extender: [name] in Treatment: [address], [name] ([id number])  [id number] Page 7 of 10 Education Provided To: Patient Education Topics Provided Venous: Methods: Explain/Verbal Responses: Reinforcements needed, State content correctly Wound/Skin Impairment: Methods: Explain/Verbal Responses: Reinforcements needed, State content correctly Electronic Signature(s) Signed: 03/28/2023 1:00:49 PM By: Zenaida Deed RN, BSN Entered By: Zenaida Deed on 03/28/2023 09:43:50 -------------------------------------------------------------------------------- Wound Assessment Details Patient Name: Date of Service: [name], [name] 03/28/2023 8:45 A M Medical Record Number: [id number]  Patient Account Number: [id number] Date of Birth/Sex: Treating RN: 08-08-1955 (67 y.o. [name], [name].[name] Primary Care Minal Stuller: [name] Other Clinician: Referring Rebekkah Powless: Treating Doral Ventrella/Extender: [name] in Treatment: 9 Wound Status Wound Number: 1 Primary Venous Leg Ulcer Etiology: Wound Location: Right, Lateral Lower Leg Wound Open Wounding Event: Gradually Appeared Status: Date Acquired: 10/31/2022 Comorbid Chronic Obstructive Pulmonary Disease (COPD), Angina, Weeks Of Treatment: 9 History: Arrhythmia, Coronary Artery Disease, Hypertension, Myocardial Clustered Wound: No Infarction, Peripheral Venous Disease Photos Wound Measurements Length: (cm) 3 Width: (cm) 1.2 Depth: (cm) 0.1 Area: (cm) 2.827 Volume: (cm) 0.283 % Reduction in Area: 70% % Reduction in Volume: 70% Epithelialization: Small (1-33%) Undermining: No Wound Description Classification: Full Thickness Without Exposed Support Wound Margin: Distinct, outline attached Exudate Amount: Medium Exudate Type: Serosanguineous Exudate Color: red, brown Structures Foul Odor After Cleansing: No Slough/Fibrino Yes Wound Bed [name], [name] ([id number]) [id number] Page 8 of 10 Granulation Amount: Large (67-100%)  Exposed Structure Granulation Quality: Red Fascia Exposed: No Necrotic Amount: Small (1-33%) Fat Layer (Subcutaneous Tissue) Exposed: Yes Necrotic Quality: Adherent Slough Tendon Exposed: No Muscle Exposed: No Joint Exposed: No Bone Exposed: No Periwound Skin Texture Texture Color No Abnormalities Noted: No No Abnormalities Noted: No Callus: No Atrophie Blanche: No Crepitus: No Cyanosis: No Excoriation: No Ecchymosis: No Induration: No Erythema: No Rash: No Hemosiderin Staining: Yes Scarring: Yes Mottled: No Pallor: No Moisture Rubor: No No Abnormalities Noted: Yes Temperature / Pain Temperature: No Abnormality Treatment Notes Wound #1 (Lower Leg) Wound Laterality: Right, Lateral Cleanser Soap and Water Discharge Instruction: May shower and wash wound with dial antibacterial soap and water prior to dressing change. Wound Cleanser Discharge Instruction: Cleanse the wound with wound cleanser prior to applying a clean dressing using gauze sponges, not tissue or cotton balls. Peri-Wound Care Sween Lotion (Moisturizing lotion) Discharge Instruction: Apply moisturizing lotion as directed Topical Gentamicin Discharge Instruction: As directed by physician Mupirocin Ointment Discharge Instruction: Apply Mupirocin (Bactroban) as instructed Skintegrity Hydrogel 4 (oz) Discharge Instruction: Apply hydrogel as directed Primary Dressing Promogran Prisma Matrix, 4.34 (sq in) (silver collagen) Discharge Instruction: Moisten collagen with saline or hydrogel Secondary Dressing Woven Gauze Sponge, Non-Sterile 4x4 in Discharge Instruction: Apply over primary dressing as directed. Secured With Compression Wrap Urgo K2 Lite, (equivalent to a 3 layer) two layer compression system, regular Discharge Instruction: Apply Urgo K2 Lite as directed (alternative to 3 layer compression). Compression Stockings Add-Ons Electronic Signature(s) Signed: 03/28/2023 12:57:00 PM By: Shawn Stall RN, BSN Entered By: Shawn Stall on 03/28/2023 06:14:07 [name] ([id number]) [id number] Page 9 of 10 -------------------------------------------------------------------------------- Wound Assessment Details Patient Name: Date of Service: [name] 03/28/2023 8:45 A M Medical Record Number: [id number] Patient Account Number: [id number] Date of Birth/Sex: Treating RN: 09-19-1955 (67 y.o. [name], [name].[name] Primary Care Pattrick Bady: [name] Other Clinician: Referring Gerard Cantara: Treating Hera Celaya/Extender: [name] in Treatment: 9 Wound Status Wound Number: 2 Primary Venous Leg Ulcer Etiology: Wound Location: Left, Lateral Lower Leg Wound Open Wounding Event: Gradually Appeared Status: Date Acquired: 01/13/2023 Comorbid Chronic Obstructive Pulmonary Disease (COPD), Angina, Weeks Of Treatment: 9 History: Arrhythmia, Coronary Artery Disease, Hypertension, Myocardial Clustered Wound: No Infarction, Peripheral Venous Disease Photos Wound Measurements Length: (cm) 2 Width: (cm) 0.4 Depth: (cm) 0.1 Area: (cm) 0.628 Volume: (cm) 0.063 % Reduction in Area: 44.1% % Reduction in Volume: 43.8% Epithelialization: Small (1-33%) Tunneling: No Undermining: No Wound Description Classification: Full Thickness Without Exposed Support Structures Wound Margin: Distinct, outline attached Exudate Amount: Medium  Exudate Type: Serosanguineous Exudate Color: red, brown Foul Odor After Cleansing: No Slough/Fibrino Yes Wound Bed Granulation Amount: Small (1-33%) Exposed Structure Granulation Quality: Red Fat Layer (Subcutaneous Tissue) Exposed: Yes Necrotic Amount: Large (67-100%) Necrotic Quality: Adherent Slough Periwound Skin Texture Texture Color No Abnormalities Noted: No No Abnormalities Noted: No Callus: No Atrophie Blanche: No Crepitus: No Cyanosis: No Excoriation: No Ecchymosis: No Induration:  No Erythema: No Rash: No Hemosiderin Staining: Yes Scarring: Yes Mottled: No Pallor: No Moisture Rubor: No No Abnormalities Noted: Yes Temperature / Pain Temperature: No Abnormality Treatment Notes Wound #2 (Lower Leg) Wound Laterality: Left, Lateral Cleanser Soap and Water Discharge Instruction: May shower and wash wound with dial antibacterial soap and water prior to dressing change. [name], [name] ([id number]) [id number] Page 10 of 10 Wound Cleanser Discharge Instruction: Cleanse the wound with wound cleanser prior to applying a clean dressing using gauze sponges, not tissue or cotton balls. Peri-Wound Care Sween Lotion (Moisturizing lotion) Discharge Instruction: Apply moisturizing lotion as directed Topical Gentamicin Discharge Instruction: As directed by physician Mupirocin Ointment Discharge Instruction: Apply Mupirocin (Bactroban) as instructed Skintegrity Hydrogel 4 (oz) Discharge Instruction: Apply hydrogel as directed Primary Dressing Promogran Prisma Matrix, 4.34 (sq in) (silver collagen) Discharge Instruction: Moisten collagen with saline or hydrogel Secondary Dressing Woven Gauze Sponge, Non-Sterile 4x4 in Discharge Instruction: Apply over primary dressing as directed. Secured With Compression Wrap Urgo K2 Lite, (equivalent to a 3 layer) two layer compression system, regular Discharge Instruction: Apply Urgo K2 Lite as directed (alternative to 3 layer compression). Compression Stockings Add-Ons Electronic Signature(s) Signed: 03/28/2023 12:57:00 PM By: Shawn Stall RN, BSN Entered By: Shawn Stall on 03/28/2023 06:15:07 -------------------------------------------------------------------------------- Vitals Details Patient Name: Date of Service: [name], [name] 03/28/2023 8:45 A M Medical Record Number: [id number] Patient Account Number: [id number] Date of Birth/Sex: Treating RN: May 03, 1955 (67 y.o. [name] Primary  Care Tilton Marsalis: [name] Other Clinician: Referring Leslye Puccini: Treating Lyden Redner/Extender: [name] in Treatment: 9 Vital Signs Time Taken: 09:08 Temperature (F): 98.3 Height (in): 68 Pulse (bpm): 81 Weight (lbs): 208 Respiratory Rate (breaths/min): 20 Body Mass Index (BMI): 31.6 Blood Pressure (mmHg): 132/78 Reference Range: 80 - 120 mg / dl Electronic Signature(s) Signed: 03/28/2023 12:57:00 PM By: Shawn Stall RN, BSN Entered By: Shawn Stall on 03/28/2023 06:08:47

## 2023-04-04 ENCOUNTER — Encounter (HOSPITAL_BASED_OUTPATIENT_CLINIC_OR_DEPARTMENT_OTHER): Payer: Medicare Other | Attending: General Surgery | Admitting: General Surgery

## 2023-04-04 DIAGNOSIS — I872 Venous insufficiency (chronic) (peripheral): Secondary | ICD-10-CM | POA: Diagnosis not present

## 2023-04-04 DIAGNOSIS — L97812 Non-pressure chronic ulcer of other part of right lower leg with fat layer exposed: Secondary | ICD-10-CM | POA: Insufficient documentation

## 2023-04-04 DIAGNOSIS — I10 Essential (primary) hypertension: Secondary | ICD-10-CM | POA: Diagnosis not present

## 2023-04-04 DIAGNOSIS — I25119 Atherosclerotic heart disease of native coronary artery with unspecified angina pectoris: Secondary | ICD-10-CM | POA: Insufficient documentation

## 2023-04-04 DIAGNOSIS — L97822 Non-pressure chronic ulcer of other part of left lower leg with fat layer exposed: Secondary | ICD-10-CM | POA: Insufficient documentation

## 2023-04-04 DIAGNOSIS — I87333 Chronic venous hypertension (idiopathic) with ulcer and inflammation of bilateral lower extremity: Secondary | ICD-10-CM | POA: Diagnosis not present

## 2023-04-04 DIAGNOSIS — I739 Peripheral vascular disease, unspecified: Secondary | ICD-10-CM | POA: Insufficient documentation

## 2023-04-04 NOTE — Progress Notes (Signed)
 SYLVESTRE, RATHGEBER ([id number]) [id number] Page 1 of 10 Visit Report for 04/04/2023 Chief Complaint Document Details Patient Name: Date of Service: [name] [name] 04/04/2023 10:00 A M Medical Record Number: [id number] Patient Account Number: [id number] Date of Birth/Sex: Treating RN: [birth year] (68 y.o. M) Primary Care Provider: Duanne [name] Other Clinician: Referring Provider: Treating Provider/Extender: [name] Duanne [name] [name] in Treatment: 10 Information Obtained from: Patient Chief Complaint Patient presents for treatment of open ulcers due to venous insufficiency Electronic Signature(s) Signed: 04/04/2023 10:40:22 AM By: Gregory Delon MD FACS Entered By: [name] on 04/04/2023 10:40:22 -------------------------------------------------------------------------------- Debridement Details Patient Name: Date of Service: [name], [name] 04/04/2023 10:00 A M Medical Record Number: [id number] Patient Account Number: [id number] Date of Birth/Sex: Treating RN: May 13, 1955 (68 y.o. [name] Primary Care Provider: Duanne [name] Other Clinician: Referring Provider: Treating Provider/Extender: [name] Duanne [name] [name] in Treatment: 10 Debridement Performed for Assessment: Wound #2 Left,Lateral Lower Leg Performed By: Physician Gregory Delon, MD The following information was scribed by: Merleen Handing The information was scribed for: [name] Debridement Type: Debridement Severity of Tissue Pre Debridement: Fat layer exposed Level of Consciousness (Pre-procedure): Awake and Alert Pre-procedure Verification/Time Out Yes - 10:30 Taken: Start Time: 10:31 Pain Control: Lidocaine  4% T opical Solution Percent of Wound Bed Debrided: 100% T Area Debrided (cm): otal 0.59 Tissue and other material debrided: Viable, Non-Viable, Slough, Subcutaneous, Slough Level: Skin/Subcutaneous Tissue Debridement  Description: Excisional Instrument: Curette Bleeding: Minimum Hemostasis Achieved: Pressure Procedural Pain: 0 Post Procedural Pain: 0 Response to Treatment: Procedure was tolerated well Level of Consciousness (Post- Awake and Alert procedure): Post Debridement Measurements of Total Wound Length: (cm) 1.5 Width: (cm) 0.5 Depth: (cm) 0.1 Volume: (cm) 0.059 Yellowhair, [name] ([id number]) [id number] Page 2 of 10 Character of Wound/Ulcer Post Debridement: Improved Severity of Tissue Post Debridement: Fat layer exposed Post Procedure Diagnosis Same as Pre-procedure Electronic Signature(s) Signed: 04/04/2023 12:14:34 PM By: Gregory Delon MD FACS Signed: 04/04/2023 12:47:24 PM By: Merleen Handing RN, BSN Entered By: Boehlein, Linda on 04/04/2023 10:33:13 -------------------------------------------------------------------------------- Debridement Details Patient Name: Date of Service: [name], [name] 04/04/2023 10:00 A M Medical Record Number: [id number] Patient Account Number: [id number] Date of Birth/Sex: Treating RN: December 17, 1955 (68 y.o. [name] Primary Care Provider: Duanne [name] Other Clinician: Referring Provider: Treating Provider/Extender: [name] Duanne [name] [name] in Treatment: 10 Debridement Performed for Assessment: Wound #1 Right,Lateral Lower Leg Performed By: Physician Gregory Delon, MD The following information was scribed by: Merleen Handing The information was scribed for: [name] Debridement Type: Debridement Severity of Tissue Pre Debridement: Fat layer exposed Level of Consciousness (Pre-procedure): Awake and Alert Pre-procedure Verification/Time Out Yes - 10:30 Taken: Start Time: 10:31 Pain Control: Lidocaine  4% T opical Solution Percent of Wound Bed Debrided: 100% T Area Debrided (cm): otal 2.42 Tissue and other material debrided: Viable, Non-Viable, Slough, Subcutaneous, Slough Level:  Skin/Subcutaneous Tissue Debridement Description: Excisional Instrument: Curette Bleeding: Minimum Hemostasis Achieved: Pressure Procedural Pain: 0 Post Procedural Pain: 0 Response to Treatment: Procedure was tolerated well Level of Consciousness (Post- Awake and Alert procedure): Post Debridement Measurements of Total Wound Length: (cm) 2.8 Width: (cm) 1.1 Depth: (cm) 0.1 Volume: (cm) 0.242 Character of Wound/Ulcer Post Debridement: Improved Severity of Tissue Post Debridement: Fat layer exposed Post Procedure Diagnosis Same as Pre-procedure Electronic Signature(s) Signed: 04/04/2023 12:14:34 PM By: Gregory Delon MD FACS Signed: 04/04/2023 12:47:24 PM By: Merleen Handing RN, BSN Entered By: Merleen Handing on 04/04/2023 10:34:05 [name] ([id number]) [id number] Page  3 of 10 -------------------------------------------------------------------------------- HPI Details Patient Name: Date of Service: [name] [name] 04/04/2023 10:00 A M Medical Record Number: [id number] Patient Account Number: [id number] Date of Birth/Sex: Treating RN: Dec 21, 1955 (68 y.o. M) Primary Care Provider: Duanne [name] Other Clinician: Referring Provider: Treating Provider/Extender: [name] Duanne [name] [name] in Treatment: 10 History of Present Illness HPI Description: ADMISSION 01/23/2023 ***VASCULAR STUDIES:*** ABIs 11/2021: +-------+-----------+-----------+------------+------------+ ABI/TBIT oday's ABIT oday's TBIPrevious ABIPrevious TBI +-------+-----------+-----------+------------+------------+ Right 1.08 0.35 0.82 0.29  +-------+-----------+-----------+------------+------------+ Left 0.97 0.27 0.88 0.27  +-------+-----------+-----------+------------+------------+ Venous reflux 03/2021 Venous Reflux Times +--------------+---------+------+-----------+------------+--------+ RIGHT Reflux NoRefluxReflux TimeDiameter  cmsComments    Yes     +--------------+---------+------+-----------+------------+--------+ CFV   yes  >1 second    +--------------+---------+------+-----------+------------+--------+ FV prox   yes  >1 second    +--------------+---------+------+-----------+------------+--------+ FV mid   yes  >1 second    +--------------+---------+------+-----------+------------+--------+ FV dist   yes  >1 second    +--------------+---------+------+-----------+------------+--------+ Popliteal   yes  >1 second    +--------------+---------+------+-----------+------------+--------+ GSV at SFJ   yes  >500 ms  0.736   +--------------+---------+------+-----------+------------+--------+ GSV prox thigh  yes  >500 ms  0.591   +--------------+---------+------+-----------+------------+--------+ GSV mid thigh   yes  >500 ms  0.591   +--------------+---------+------+-----------+------------+--------+ GSV dist thigh  yes  >500 ms  0.546   +--------------+---------+------+-----------+------------+--------+ GSV at knee no    0.592   +--------------+---------+------+-----------+------------+--------+ GSV prox calf     0.441   +--------------+---------+------+-----------+------------+--------+ GSV mid calf     0.461   +--------------+---------+------+-----------+------------+--------+ SSV Pop Fossa no    0.406   +--------------+---------+------+-----------+------------+--------+ SSV prox calf   yes  >500 ms  0.379   +--------------+---------+------+-----------+------------+--------+ SSV mid calf     0.379   +--------------+---------+------+-----------+------------+--------+ Summary: Right: - No evidence of deep vein thrombosis seen in the right lower extremity, from the common femoral through the popliteal veins. - No evidence of superficial venous thrombosis in the right lower extremity. - Venous  reflux is noted in the right common femoral vein. - Venous reflux is noted in the right sapheno-femoral junction. - Venous reflux is noted in the right greater saphenous vein in the thigh. - Venous reflux is noted in the right femoral vein. - Venous reflux is noted in the right popliteal vein. - Venous reflux is noted in the right short saphenous vein. This is a 68 year old smoker with a significant history of cardiac and peripheral vascular disease. He has had issues with venous ulceration on his right leg that have resolved with local care in the past, but the most recent wound has been present for a couple of months and has not healed despite treatment with Silvadene  cream and home application of Unna boots. He also has developed 2 small ulcers on the left that have presented within the past couple of weeks. Of note, his right greater saphenous vein was harvested in 2023 for femoral above-knee popliteal bypass. He was referred here by his primary care provider for further evaluation and management of his lower extremity venous ulcers. [name], [name] ([id number]) [id number] Page 4 of 10 01/31/2023: The left medial lower leg wound is healed. The left lateral and right lateral lower leg wounds are both smaller. They have slough accumulation on their surfaces. Edema control is excellent. 02/05/2023: Both wounds are just slightly smaller. They are cleaner, however, with a bit of slough accumulation on the surfaces. Edema control remains good. 02/14/2023: The wounds are smaller again today. They have slough on the surface. Edema control is good. 02/19/2023: The wounds measured about  the same size today, but there is more epithelialization occurring in the midportion of the wounds. There is slough accumulation once again. Edema control remains good. 02/25/2023: The left lateral leg wound measures slightly longer today. Both wounds have more slough on them this week then on  prior occasions. Edema control is good. No erythema, induration, or malodor to suggest infection. 03/18/2023: Both wounds measured smaller today. There is an enlarging bud of epithelialized tissue in the center of the right leg wound. There is slough on the surface of both. Edema control is good. 03/28/2023: The wound measurements did not change today, but on visual inspection, both wounds appear narrower. There is slough accumulation on both surfaces. Good edema control bilaterally. 04/04/2023: Both wounds measured smaller today, particularly the left lateral leg wound. There is slough accumulation present. Good edema control bilaterally. Electronic Signature(s) Signed: 04/04/2023 10:40:58 AM By: Gregory Nest MD FACS Entered By: Gregory Nest on 04/04/2023 10:40:57 -------------------------------------------------------------------------------- Physical Exam Details Patient Name: Date of Service: Gregory MARGRET SAILOR, [name] 04/04/2023 10:00 A M Medical Record Number: [id number] Patient Account Number: [id number] Date of Birth/Sex: Treating RN: [birth year] (68 y.o. M) Primary Care Provider: Duanne [name] Other Clinician: Referring Provider: Treating Provider/Extender: Gregory Nest Duanne [name] [name] in Treatment: 10 Constitutional . . . . no acute distress. Respiratory Normal work of breathing on room air.. Notes 04/04/2023: Both wounds measured smaller today, particularly the left lateral leg wound. There is slough accumulation present. Good edema control bilaterally. Electronic Signature(s) Signed: 04/04/2023 10:41:30 AM By: Gregory Nest MD FACS Entered By: Gregory Nest on 04/04/2023 10:41:30 -------------------------------------------------------------------------------- Physician Orders Details Patient Name: Date of Service: Gregory MARGRET SAILOR, [name] 04/04/2023 10:00 A M Medical Record Number: [id number] Patient Account Number: [id number] Date of Birth/Sex: Treating RN: 1955-08-13 (68  y.o. [name] Primary Care Provider: Duanne [name] Other Clinician: Referring Provider: Treating Provider/Extender: Gregory Nest Duanne [name] [name] in Treatment: 10 The following information was scribed by: Merleen Handing The information was scribed for: Gregory Nest Verbal / Phone Orders: No Diagnosis [address] KAYDAN, WONG ([id number]) [id number] Page 5 of 10 ICD-10 Coding Code Description [id number] Non-pressure chronic ulcer of other part of right lower leg with fat layer exposed L97.822 Non-pressure chronic ulcer of other part of left lower leg with fat layer exposed I87.333 Chronic venous hypertension (idiopathic) with ulcer and inflammation of bilateral lower extremity I73.9 Peripheral vascular disease, unspecified Follow-up Appointments ppointment in 1 week. - Dr. Marolyn 1/10 @ 10:15 am Return A Anesthetic (In clinic) Topical Lidocaine  4% applied to wound bed Bathing/ Shower/ Hygiene May shower with protection but do not get wound dressing(s) wet. Protect dressing(s) with water  repellant cover (for example, large plastic bag) or a cast cover and may then take shower. Edema Control - Orders / Instructions Elevate legs to the level of the heart or above for 30 minutes daily and/or when sitting for 3-4 times a day throughout the day. Avoid standing for long periods of time. Exercise regularly Additional Orders / Instructions Follow Nutritious Diet - vitamin C 500 mg start once a day and if tolerated then twice a day, zinc 30-50 mg once daily, protein shakes daily Wound Treatment Wound #1 - Lower Leg Wound Laterality: Right, Lateral Cleanser: Soap and Water  1 x Per Week/30 Days Discharge Instructions: in clinic Cleanser: Wound Cleanser 1 x Per Week/30 Days Peri-Wound Care: Sween Lotion (Moisturizing lotion) 1 x Per Week/30 Days Discharge Instructions: Apply moisturizing lotion as directed Topical: Gentamicin 1 x Per Week/30  Days Discharge  Instructions: As directed by physician Topical: Mupirocin Ointment 1 x Per Week/30 Days Discharge Instructions: Apply Mupirocin (Bactroban) as instructed Topical: Skintegrity Hydrogel 4 (oz) 1 x Per Week/30 Days Discharge Instructions: Apply hydrogel as directed Prim Dressing: Promogran Prisma Matrix, 4.34 (sq in) (silver  collagen) 1 x Per Week/30 Days ary Discharge Instructions: Moisten collagen with saline or hydrogel Secondary Dressing: Optifoam Non-Adhesive Dressing, 4x4 in 1 x Per Week/30 Days Discharge Instructions: Apply over primary dressing as directed. Compression Wrap: Urgo K2 Lite, (equivalent to a 3 layer) two layer compression system, regular 1 x Per Week/30 Days Discharge Instructions: Apply Urgo K2 Lite as directed (alternative to 3 layer compression). Wound #2 - Lower Leg Wound Laterality: Left, Lateral Cleanser: Soap and Water  1 x Per Week/30 Days Discharge Instructions: May shower and wash wound with dial antibacterial soap and water  prior to dressing change. Cleanser: Wound Cleanser 1 x Per Week/30 Days Discharge Instructions: Cleanse the wound with wound cleanser prior to applying a clean dressing using gauze sponges, not tissue or cotton balls. Peri-Wound Care: Sween Lotion (Moisturizing lotion) 1 x Per Week/30 Days Discharge Instructions: Apply moisturizing lotion as directed Topical: Gentamicin 1 x Per Week/30 Days Discharge Instructions: As directed by physician Topical: Mupirocin Ointment 1 x Per Week/30 Days Discharge Instructions: Apply Mupirocin (Bactroban) as instructed Topical: Skintegrity Hydrogel 4 (oz) 1 x Per Week/30 Days Discharge Instructions: Apply hydrogel as directed Prim Dressing: Promogran Prisma Matrix, 4.34 (sq in) (silver  collagen) 1 x Per Week/30 Days ary Discharge Instructions: Moisten collagen with saline or hydrogel Kimmel, [name] ([id number]) [id number] Page 6 of 10 Secondary Dressing: Optifoam  Non-Adhesive Dressing, 4x4 in 1 x Per Week/30 Days Discharge Instructions: Apply over primary dressing as directed. Compression Wrap: Urgo K2 Lite, (equivalent to a 3 layer) two layer compression system, regular 1 x Per Week/30 Days Discharge Instructions: Apply Urgo K2 Lite as directed (alternative to 3 layer compression). Electronic Signature(s) Signed: 04/04/2023 12:14:34 PM By: Gregory Nest MD FACS Entered By: Gregory Nest on 04/04/2023 10:41:57 -------------------------------------------------------------------------------- Problem List Details Patient Name: Date of Service: Gregory MARGRET SAILOR, [name] 04/04/2023 10:00 A M Medical Record Number: [id number] Patient Account Number: [id number] Date of Birth/Sex: Treating RN: 1955/08/02 (68 y.o. [name] Primary Care Provider: Duanne [name] Other Clinician: Referring Provider: Treating Provider/Extender: Gregory Nest Duanne [name] [name] in Treatment: 10 Active Problems ICD-10 Encounter Code Description Active Date MDM Diagnosis L97.812 Non-pressure chronic ulcer of other part of right lower leg with fat layer 01/23/2023 No Yes exposed L97.822 Non-pressure chronic ulcer of other part of left lower leg with fat layer 01/23/2023 No Yes exposed I87.333 Chronic venous hypertension (idiopathic) with ulcer and inflammation of 01/23/2023 No Yes bilateral lower extremity I73.9 Peripheral vascular disease, unspecified 01/23/2023 No Yes Inactive Problems Resolved Problems Electronic Signature(s) Signed: 04/04/2023 10:40:03 AM By: Gregory Nest MD FACS Entered By: Gregory Nest on 04/04/2023 10:40:02 -------------------------------------------------------------------------------- Progress Note Details Patient Name: Date of Service: Gregory MARGRET SAILOR, [name] 04/04/2023 10:00 A M Medical Record Number: [id number] Patient Account Number: [id number] BECKAM, ABDULAZIZ ([id number]) [id number] Page 7 of 10 Date of  Birth/Sex: Treating RN: [birth year] (68 y.o. M) Primary Care Provider: Other Clinician: Duanne [name] Referring Provider: Treating Provider/Extender: Gregory Nest Duanne [name] [name] in Treatment: 10 Subjective Chief Complaint Information obtained from Patient Patient presents for treatment of open ulcers due to venous insufficiency History of Present Illness (HPI) ADMISSION 01/23/2023 ***VASCULAR STUDIES:*** ABIs 11/2021: +-------+-----------+-----------+------------+------------+ ABI/TBIT oday's ABIT oday's TBIPrevious ABIPrevious TBI +-------+-----------+-----------+------------+------------+ Right 1.08 0.35 0.82 0.29  +-------+-----------+-----------+------------+------------+  Left 0.97 0.27 0.88 0.27  +-------+-----------+-----------+------------+------------+ Venous reflux 03/2021 Venous Reflux Times +--------------+---------+------+-----------+------------+--------+ RIGHT Reflux NoRefluxReflux TimeDiameter cmsComments    Yes     +--------------+---------+------+-----------+------------+--------+ CFV   yes  >1 second    +--------------+---------+------+-----------+------------+--------+ FV prox   yes  >1 second    +--------------+---------+------+-----------+------------+--------+ FV mid   yes  >1 second    +--------------+---------+------+-----------+------------+--------+ FV dist   yes  >1 second    +--------------+---------+------+-----------+------------+--------+ Popliteal   yes  >1 second    +--------------+---------+------+-----------+------------+--------+ GSV at SFJ   yes  >500 ms  0.736   +--------------+---------+------+-----------+------------+--------+ GSV prox thigh  yes  >500 ms  0.591   +--------------+---------+------+-----------+------------+--------+ GSV mid thigh   yes  >500 ms  0.591    +--------------+---------+------+-----------+------------+--------+ GSV dist thigh  yes  >500 ms  0.546   +--------------+---------+------+-----------+------------+--------+ GSV at knee no    0.592   +--------------+---------+------+-----------+------------+--------+ GSV prox calf     0.441   +--------------+---------+------+-----------+------------+--------+ GSV mid calf     0.461   +--------------+---------+------+-----------+------------+--------+ SSV Pop Fossa no    0.406   +--------------+---------+------+-----------+------------+--------+ SSV prox calf   yes  >500 ms  0.379   +--------------+---------+------+-----------+------------+--------+ SSV mid calf     0.379   +--------------+---------+------+-----------+------------+--------+ Summary: Right: - No evidence of deep vein thrombosis seen in the right lower extremity, from the common femoral through the popliteal veins. - No evidence of superficial venous thrombosis in the right lower extremity. - Venous reflux is noted in the right common femoral vein. - Venous reflux is noted in the right sapheno-femoral junction. - Venous reflux is noted in the right greater saphenous vein in the thigh. - Venous reflux is noted in the right femoral vein. - Venous reflux is noted in the right popliteal vein. - Venous reflux is noted in the right short saphenous vein. This is a 68 year old smoker with a significant history of cardiac and peripheral vascular disease. He has had issues with venous ulceration on his right leg that have resolved with local care in the past, but the most recent wound has been present for a couple of months and has not healed despite treatment with Silvadene  cream and home application of Unna boots. He also has developed 2 small ulcers on the left that have presented within the past couple of weeks. Of note, his right greater saphenous vein was harvested in 2023  for femoral above-knee popliteal bypass. He was referred here by his primary care provider for further evaluation and management of his lower extremity venous ulcers. 01/31/2023: The left medial lower leg wound is healed. The left lateral and right lateral lower leg wounds are both smaller. They have slough accumulation on their surfaces. Edema control is excellent. 02/05/2023: Both wounds are just slightly smaller. They are cleaner, however, with a bit of slough accumulation on the surfaces. Edema control remains good. 02/14/2023: The wounds are smaller again today. They have slough on the surface. Edema control is good. [name], [name] ([id number]) [id number] Page 8 of 10 02/19/2023: The wounds measured about the same size today, but there is more epithelialization occurring in the midportion of the wounds. There is slough accumulation once again. Edema control remains good. 02/25/2023: The left lateral leg wound measures slightly longer today. Both wounds have more slough on them this week then on prior occasions. Edema control is good. No erythema, induration, or malodor to suggest infection. 03/18/2023: Both wounds measured smaller today. There is an enlarging bud of epithelialized tissue in the center of the right leg wound. There  is slough on the surface of both. Edema control is good. 03/28/2023: The wound measurements did not change today, but on visual inspection, both wounds appear narrower. There is slough accumulation on both surfaces. Good edema control bilaterally. 04/04/2023: Both wounds measured smaller today, particularly the left lateral leg wound. There is slough accumulation present. Good edema control bilaterally. Objective Constitutional no acute distress. Vitals Time Taken: 10:05 AM, Height: 68 in, Weight: 208 lbs, BMI: 31.6, Temperature: 98.2 F, Pulse: 81 bpm, Respiratory Rate: 20 breaths/min, Blood Pressure: 121/61 mmHg. Respiratory Normal work  of breathing on room air.. General Notes: 04/04/2023: Both wounds measured smaller today, particularly the left lateral leg wound. There is slough accumulation present. Good edema control bilaterally. Integumentary (Hair, Skin) Wound #1 status is Open. Original cause of wound was Gradually Appeared. The date acquired was: 10/31/2022. The wound has been in treatment 10 weeks. The wound is located on the Right,Lateral Lower Leg. The wound measures 2.8cm length x 1.1cm width x 0.1cm depth; 2.419cm^2 area and 0.242cm^3 volume. There is Fat Layer (Subcutaneous Tissue) exposed. There is no tunneling or undermining noted. There is a medium amount of serosanguineous drainage noted. The wound margin is distinct with the outline attached to the wound base. There is small (1-33%) red granulation within the wound bed. There is a large (67-100%) amount of necrotic tissue within the wound bed including Adherent Slough. The periwound skin appearance had no abnormalities noted for moisture. The periwound skin appearance exhibited: Scarring, Hemosiderin Staining. The periwound skin appearance did not exhibit: Callus, Crepitus, Excoriation, Induration, Rash, Atrophie Blanche, Cyanosis, Ecchymosis, Mottled, Pallor, Rubor, Erythema. Periwound temperature was noted as No Abnormality. Wound #2 status is Open. Original cause of wound was Gradually Appeared. The date acquired was: 01/13/2023. The wound has been in treatment 10 weeks. The wound is located on the Left,Lateral Lower Leg. The wound measures 1.5cm length x 0.5cm width x 0.1cm depth; 0.589cm^2 area and 0.059cm^3 volume. There is Fat Layer (Subcutaneous Tissue) exposed. There is no tunneling or undermining noted. There is a medium amount of serosanguineous drainage noted. The wound margin is distinct with the outline attached to the wound base. There is small (1-33%) red granulation within the wound bed. There is a large (67-100%) amount of necrotic tissue within the  wound bed including Adherent Slough. The periwound skin appearance had no abnormalities noted for moisture. The periwound skin appearance exhibited: Scarring, Hemosiderin Staining. The periwound skin appearance did not exhibit: Callus, Crepitus, Excoriation, Induration, Rash, Atrophie Blanche, Cyanosis, Ecchymosis, Mottled, Pallor, Rubor, Erythema. Periwound temperature was noted as No Abnormality. Assessment Active Problems ICD-10 Non-pressure chronic ulcer of other part of right lower leg with fat layer exposed Non-pressure chronic ulcer of other part of left lower leg with fat layer exposed Chronic venous hypertension (idiopathic) with ulcer and inflammation of bilateral lower extremity Peripheral vascular disease, unspecified Procedures Wound #1 Pre-procedure diagnosis of Wound #1 is a Venous Leg Ulcer located on the Right,Lateral Lower Leg .Severity of Tissue Pre Debridement is: Fat layer exposed. There was a Excisional Skin/Subcutaneous Tissue Debridement with a total area of 2.42 sq cm performed by Gregory Nest, MD. With the following instrument(s): Curette to remove Viable and Non-Viable tissue/material. Material removed includes Subcutaneous Tissue and Slough and after achieving pain control using Lidocaine  4% T opical Solution. No specimens were taken. A time out was conducted at 10:30, prior to the start of the procedure. A Minimum amount of bleeding was controlled with Pressure. The procedure was tolerated well with a  pain level of 0 throughout and a pain level of 0 following the procedure. Post Debridement Measurements: 2.8cm length x 1.1cm width x 0.1cm depth; 0.242cm^3 volume. Character of Wound/Ulcer Post Debridement is improved. Severity of Tissue Post Debridement is: Fat layer exposed. Post procedure Diagnosis Wound #1: Same as Pre-Procedure Pre-procedure diagnosis of Wound #1 is a Venous Leg Ulcer located on the Right,Lateral Lower Leg . There was a Double Layer Compression  [address] ANTONIE, BORJON ([id number]) [id number] Page 9 of 10 Procedure by Merleen Handing, RN. Post procedure Diagnosis Wound #1: Same as Pre-Procedure Notes: urgo lite. Wound #2 Pre-procedure diagnosis of Wound #2 is a Venous Leg Ulcer located on the Left,Lateral Lower Leg .Severity of Tissue Pre Debridement is: Fat layer exposed. There was a Excisional Skin/Subcutaneous Tissue Debridement with a total area of 0.59 sq cm performed by Gregory Nest, MD. With the following instrument(s): Curette to remove Viable and Non-Viable tissue/material. Material removed includes Subcutaneous Tissue and Slough and after achieving pain control using Lidocaine  4% T opical Solution. No specimens were taken. A time out was conducted at 10:30, prior to the start of the procedure. A Minimum amount of bleeding was controlled with Pressure. The procedure was tolerated well with a pain level of 0 throughout and a pain level of 0 following the procedure. Post Debridement Measurements: 1.5cm length x 0.5cm width x 0.1cm depth; 0.059cm^3 volume. Character of Wound/Ulcer Post Debridement is improved. Severity of Tissue Post Debridement is: Fat layer exposed. Post procedure Diagnosis Wound #2: Same as Pre-Procedure Pre-procedure diagnosis of Wound #2 is a Venous Leg Ulcer located on the Left,Lateral Lower Leg . There was a Double Layer Compression Therapy Procedure by Merleen Handing, RN. Post procedure Diagnosis Wound #2: Same as Pre-Procedure Notes: urgo lite. Plan Follow-up Appointments: Return Appointment in 1 week. - Dr. Marolyn 1/10 @ 10:15 am Anesthetic: (In clinic) Topical Lidocaine  4% applied to wound bed Bathing/ Shower/ Hygiene: May shower with protection but do not get wound dressing(s) wet. Protect dressing(s) with water  repellant cover (for example, large plastic bag) or a cast cover and may then take shower. Edema Control - Orders / Instructions: Elevate legs to the level  of the heart or above for 30 minutes daily and/or when sitting for 3-4 times a day throughout the day. Avoid standing for long periods of time. Exercise regularly Additional Orders / Instructions: Follow Nutritious Diet - vitamin C 500 mg start once a day and if tolerated then twice a day, zinc 30-50 mg once daily, protein shakes daily WOUND #1: - Lower Leg Wound Laterality: Right, Lateral Cleanser: Soap and Water  1 x Per Week/30 Days Discharge Instructions: in clinic Cleanser: Wound Cleanser 1 x Per Week/30 Days Peri-Wound Care: Sween Lotion (Moisturizing lotion) 1 x Per Week/30 Days Discharge Instructions: Apply moisturizing lotion as directed Topical: Gentamicin 1 x Per Week/30 Days Discharge Instructions: As directed by physician Topical: Mupirocin Ointment 1 x Per Week/30 Days Discharge Instructions: Apply Mupirocin (Bactroban) as instructed Topical: Skintegrity Hydrogel 4 (oz) 1 x Per Week/30 Days Discharge Instructions: Apply hydrogel as directed Prim Dressing: Promogran Prisma Matrix, 4.34 (sq in) (silver  collagen) 1 x Per Week/30 Days ary Discharge Instructions: Moisten collagen with saline or hydrogel Secondary Dressing: Optifoam Non-Adhesive Dressing, 4x4 in 1 x Per Week/30 Days Discharge Instructions: Apply over primary dressing as directed. Com pression Wrap: Urgo K2 Lite, (equivalent to a 3 layer) two layer compression system, regular 1 x Per Week/30 Days Discharge Instructions: Apply Urgo K2 Lite as directed (alternative to 3  layer compression). WOUND #2: - Lower Leg Wound Laterality: Left, Lateral Cleanser: Soap and Water  1 x Per Week/30 Days Discharge Instructions: May shower and wash wound with dial antibacterial soap and water  prior to dressing change. Cleanser: Wound Cleanser 1 x Per Week/30 Days Discharge Instructions: Cleanse the wound with wound cleanser prior to applying a clean dressing using gauze sponges, not tissue or cotton balls. Peri-Wound Care: Sween  Lotion (Moisturizing lotion) 1 x Per Week/30 Days Discharge Instructions: Apply moisturizing lotion as directed Topical: Gentamicin 1 x Per Week/30 Days Discharge Instructions: As directed by physician Topical: Mupirocin Ointment 1 x Per Week/30 Days Discharge Instructions: Apply Mupirocin (Bactroban) as instructed Topical: Skintegrity Hydrogel 4 (oz) 1 x Per Week/30 Days Discharge Instructions: Apply hydrogel as directed Prim Dressing: Promogran Prisma Matrix, 4.34 (sq in) (silver  collagen) 1 x Per Week/30 Days ary Discharge Instructions: Moisten collagen with saline or hydrogel Secondary Dressing: Optifoam Non-Adhesive Dressing, 4x4 in 1 x Per Week/30 Days Discharge Instructions: Apply over primary dressing as directed. Com pression Wrap: Urgo K2 Lite, (equivalent to a 3 layer) two layer compression system, regular 1 x Per Week/30 Days Discharge Instructions: Apply Urgo K2 Lite as directed (alternative to 3 layer compression). 04/04/2023: Both wounds measured smaller today, particularly the left lateral leg wound. There is slough accumulation present. Good edema control bilaterally. I used a curette to debride eschar, slough, and subcutaneous tissue from both of the wounds. We will continue the mixture of topical gentamicin and mupirocin to prevent recurrence of the polymicrobial skin infection he had on intake. Continue Prisma silver  collagen moistened with hydrogel and covered with Optifoam to maintain good moisture balance. Continue bilateral Urgo lite compression wraps. Follow-up in 1 week. Electronic Signature(s) [place], Alvin ([id number]) [id number] Page 10 of 10 Signed: 04/04/2023 10:43:23 AM By: Gregory Nest MD FACS Entered By: Gregory Nest on 04/04/2023 10:43:23 -------------------------------------------------------------------------------- SuperBill Details Patient Name: Date of Service: Gregory MARGRET SAILOR, [name] 04/04/2023 Medical Record Number:  [id number] Patient Account Number: [id number] Date of Birth/Sex: Treating RN: 10/01/1955 (68 y.o. M) Primary Care Provider: Duanne [name] Other Clinician: Referring Provider: Treating Provider/Extender: Gregory Nest Duanne [name] [name] in Treatment: 10 Diagnosis Coding ICD-10 Codes Code Description [phone] Non-pressure chronic ulcer of other part of right lower leg with fat layer exposed L97.822 Non-pressure chronic ulcer of other part of left lower leg with fat layer exposed I87.333 Chronic venous hypertension (idiopathic) with ulcer and inflammation of bilateral lower extremity I73.9 Peripheral vascular disease, unspecified Facility Procedures : CPT4 Code: [id number] Description: 11042 - DEB SUBQ TISSUE 20 SQ CM/< ICD-10 Diagnosis Description L97.812 Non-pressure chronic ulcer of other part of right lower leg with fat layer exp L97.822 Non-pressure chronic ulcer of other part of left lower leg with fat layer expo Modifier: osed sed Quantity: 1 Physician Procedures : CPT4 Code Description Modifier [id number] 99214 - WC PHYS LEVEL 4 - EST PT ICD-10 Diagnosis Description L97.812 Non-pressure chronic ulcer of other part of right lower leg with fat layer exposed L97.822 Non-pressure chronic ulcer of other part of left  lower leg with fat layer exposed I87.333 Chronic venous hypertension (idiopathic) with ulcer and inflammation of bilateral lower extremity I73.9 Peripheral vascular disease, unspecified Quantity: 1 : [id number] 11042 - WC PHYS SUBQ TISS 20 SQ CM ICD-10 Diagnosis Description L97.812 Non-pressure chronic ulcer of other part of right lower leg with fat layer exposed L97.822 Non-pressure chronic ulcer of other part of left lower leg with fat layer exposed Quantity: 1 Electronic Signature(s) Signed: 04/04/2023 10:44:47 AM  By: Gregory Nest MD FACS Entered By: Gregory Nest on 04/04/2023 10:44:47

## 2023-04-11 ENCOUNTER — Encounter (HOSPITAL_BASED_OUTPATIENT_CLINIC_OR_DEPARTMENT_OTHER): Payer: Medicare Other | Admitting: General Surgery

## 2023-04-11 DIAGNOSIS — I87333 Chronic venous hypertension (idiopathic) with ulcer and inflammation of bilateral lower extremity: Secondary | ICD-10-CM | POA: Diagnosis not present

## 2023-04-11 NOTE — Progress Notes (Signed)
 DERYK, BOZMAN ([id number]) [id number] Page 1 of 10 Visit Report for 04/11/2023 Chief Complaint Document Details Patient Name: Date of Service: [name] [name] [name] 04/11/2023 10:15 A M Medical Record Number: [id number] Patient Account Number: [id number] Date of Birth/Sex: Treating RN: 23-Sep-1955 (69 y.o. M) Primary Care Provider: [name] Other Clinician: Referring Provider: Treating Provider/Extender: [name] [name] in Treatment: 11 Information Obtained from: Patient Chief Complaint Patient presents for treatment of open ulcers due to venous insufficiency Electronic Signature(s) Signed: 04/11/2023 11:25:26 AM By: Marolyn Delon MD FACS Entered By: Marolyn Delon on 04/11/2023 11:25:26 -------------------------------------------------------------------------------- Debridement Details Patient Name: Date of Service: [name] Gregory LOISE, [name] 04/11/2023 10:15 A M Medical Record Number: [id number] Patient Account Number: [id number] Date of Birth/Sex: Treating RN: 1955/05/13 (68 y.o. [name] Aver, Rock Primary Care Provider: [name] Other Clinician: Referring Provider: Treating Provider/Extender: [name] [name] in Treatment: 11 Debridement Performed for Assessment: Wound #2 Left,Lateral Lower Leg Performed By: Physician Marolyn Delon, MD The following information was scribed by: Aver Rock The information was scribed for: Marolyn Delon Debridement Type: Debridement Severity of Tissue Pre Debridement: Fat layer exposed Level of Consciousness (Pre-procedure): Awake and Alert Pre-procedure Verification/Time Out Yes - 10:40 Taken: Start Time: 10:40 Pain Control: Lidocaine  5% topical ointment Percent of Wound Bed Debrided: 100% T Area Debrided (cm): otal 0.28 Tissue and other material debrided: Viable, Non-Viable, Slough, Subcutaneous, Slough Level: Skin/Subcutaneous Tissue Debridement  Description: Excisional Instrument: Curette Bleeding: Minimum Hemostasis Achieved: Pressure Procedural Pain: 0 Post Procedural Pain: 0 Response to Treatment: Procedure was tolerated well Level of Consciousness (Post- Awake and Alert procedure): Post Debridement Measurements of Total Wound Length: (cm) 1.2 Width: (cm) 0.3 Depth: (cm) 0.1 Volume: (cm) 0.028 [name], [name] ([id number]) [id number] Page 2 of 10 Character of Wound/Ulcer Post Debridement: Improved Severity of Tissue Post Debridement: Fat layer exposed Post Procedure Diagnosis Same as Pre-procedure Electronic Signature(s) Signed: 04/11/2023 12:19:00 PM By: Marolyn Delon MD FACS Signed: 04/11/2023 12:31:59 PM By: Aver Rock RN, BSN Entered By: Aver Rock on 04/11/2023 10:43:40 -------------------------------------------------------------------------------- Debridement Details Patient Name: Date of Service: [name] Gregory LOISE, [name] 04/11/2023 10:15 A M Medical Record Number: [id number] Patient Account Number: [id number] Date of Birth/Sex: Treating RN: [birth year] (68 y.o. [name] Aver Rock Primary Care Provider: [name] Other Clinician: Referring Provider: Treating Provider/Extender: [name] [name] in Treatment: 11 Debridement Performed for Assessment: Wound #1 Right,Lateral Lower Leg Performed By: Physician Marolyn Delon, MD The following information was scribed by: Aver Rock The information was scribed for: Marolyn Delon Debridement Type: Debridement Severity of Tissue Pre Debridement: Fat layer exposed Level of Consciousness (Pre-procedure): Awake and Alert Pre-procedure Verification/Time Out Yes - 10:40 Taken: Start Time: 10:40 Pain Control: Lidocaine  5% topical ointment Percent of Wound Bed Debrided: 100% T Area Debrided (cm): otal 2.12 Tissue and other material debrided: Viable, Non-Viable, Slough, Subcutaneous, Slough Level:  Skin/Subcutaneous Tissue Debridement Description: Excisional Instrument: Curette Bleeding: Minimum Hemostasis Achieved: Pressure Procedural Pain: 0 Post Procedural Pain: 0 Response to Treatment: Procedure was tolerated well Level of Consciousness (Post- Awake and Alert procedure): Post Debridement Measurements of Total Wound Length: (cm) 2.7 Width: (cm) 1 Depth: (cm) 0.1 Volume: (cm) 0.212 Character of Wound/Ulcer Post Debridement: Improved Severity of Tissue Post Debridement: Fat layer exposed Post Procedure Diagnosis Same as Pre-procedure Electronic Signature(s) Signed: 04/11/2023 12:19:00 PM By: Marolyn Delon MD FACS Signed: 04/11/2023 12:31:59 PM By: Aver Rock RN, BSN Entered By: Aver Rock on 04/11/2023 10:44:47 [name] ([id number]) [id number] Page 3 of  10 -------------------------------------------------------------------------------- HPI Details Patient Name: Date of Service: [name] [name] [name] 04/11/2023 10:15 A M Medical Record Number: [id number] Patient Account Number: [id number] Date of Birth/Sex: Treating RN: 1955-05-10 (68 y.o. M) Primary Care Provider: [name] Other Clinician: Referring Provider: Treating Provider/Extender: [name] [name] in Treatment: 11 History of Present Illness HPI Description: ADMISSION 01/23/2023 ***VASCULAR STUDIES:*** ABIs 11/2021: +-------+-----------+-----------+------------+------------+ ABI/TBIT oday's ABIT oday's TBIPrevious ABIPrevious TBI +-------+-----------+-----------+------------+------------+ Right 1.08 0.35 0.82 0.29  +-------+-----------+-----------+------------+------------+ Left 0.97 0.27 0.88 0.27  +-------+-----------+-----------+------------+------------+ Venous reflux 03/2021 Venous Reflux Times +--------------+---------+------+-----------+------------+--------+ RIGHT Reflux NoRefluxReflux TimeDiameter  cmsComments    Yes     +--------------+---------+------+-----------+------------+--------+ CFV   yes  >1 second    +--------------+---------+------+-----------+------------+--------+ FV prox   yes  >1 second    +--------------+---------+------+-----------+------------+--------+ FV mid   yes  >1 second    +--------------+---------+------+-----------+------------+--------+ FV dist   yes  >1 second    +--------------+---------+------+-----------+------------+--------+ Popliteal   yes  >1 second    +--------------+---------+------+-----------+------------+--------+ GSV at SFJ   yes  >500 ms  0.736   +--------------+---------+------+-----------+------------+--------+ GSV prox thigh  yes  >500 ms  0.591   +--------------+---------+------+-----------+------------+--------+ GSV mid thigh   yes  >500 ms  0.591   +--------------+---------+------+-----------+------------+--------+ GSV dist thigh  yes  >500 ms  0.546   +--------------+---------+------+-----------+------------+--------+ GSV at knee no    0.592   +--------------+---------+------+-----------+------------+--------+ GSV prox calf     0.441   +--------------+---------+------+-----------+------------+--------+ GSV mid calf     0.461   +--------------+---------+------+-----------+------------+--------+ SSV Pop Fossa no    0.406   +--------------+---------+------+-----------+------------+--------+ SSV prox calf   yes  >500 ms  0.379   +--------------+---------+------+-----------+------------+--------+ SSV mid calf     0.379   +--------------+---------+------+-----------+------------+--------+ Summary: Right: - No evidence of deep vein thrombosis seen in the right lower extremity, from the common femoral through the popliteal veins. - No evidence of superficial venous thrombosis in the right lower extremity. - Venous  reflux is noted in the right common femoral vein. - Venous reflux is noted in the right sapheno-femoral junction. - Venous reflux is noted in the right greater saphenous vein in the thigh. - Venous reflux is noted in the right femoral vein. - Venous reflux is noted in the right popliteal vein. - Venous reflux is noted in the right short saphenous vein. This is a 68 year old smoker with a significant history of cardiac and peripheral vascular disease. He has had issues with venous ulceration on his right leg that have resolved with local care in the past, but the most recent wound has been present for a couple of months and has not healed despite treatment with Silvadene  cream and home application of Unna boots. He also has developed 2 small ulcers on the left that have presented within the past couple of weeks. Of note, his right greater saphenous vein was harvested in 2023 for femoral above-knee popliteal bypass. He was referred here by his primary care provider for further evaluation and management of his lower extremity venous ulcers. [name], [name] ([id number]) [id number] Page 4 of 10 01/31/2023: The left medial lower leg wound is healed. The left lateral and right lateral lower leg wounds are both smaller. They have slough accumulation on their surfaces. Edema control is excellent. 02/05/2023: Both wounds are just slightly smaller. They are cleaner, however, with a bit of slough accumulation on the surfaces. Edema control remains good. 02/14/2023: The wounds are smaller again today. They have slough on the surface. Edema control is good. 02/19/2023: The wounds measured about the same  size today, but there is more epithelialization occurring in the midportion of the wounds. There is slough accumulation once again. Edema control remains good. 02/25/2023: The left lateral leg wound measures slightly longer today. Both wounds have more slough on them this week then on  prior occasions. Edema control is good. No erythema, induration, or malodor to suggest infection. 03/18/2023: Both wounds measured smaller today. There is an enlarging bud of epithelialized tissue in the center of the right leg wound. There is slough on the surface of both. Edema control is good. 03/28/2023: The wound measurements did not change today, but on visual inspection, both wounds appear narrower. There is slough accumulation on both surfaces. Good edema control bilaterally. 04/04/2023: Both wounds measured smaller today, particularly the left lateral leg wound. There is slough accumulation present. Good edema control bilaterally. 04/11/2023: Both wounds continues to contract. The left lateral leg wound is substantially smaller and more superficial. Both have some slough buildup. Edema control continues to be excellent bilaterally. Electronic Signature(s) Signed: 04/11/2023 11:26:27 AM By: Marolyn Nest MD FACS Entered By: Marolyn Nest on 04/11/2023 11:26:26 -------------------------------------------------------------------------------- Physical Exam Details Patient Name: Date of Service: [name], [name] 04/11/2023 10:15 A M Medical Record Number: [id number] Patient Account Number: [id number] Date of Birth/Sex: Treating RN: [birth year] (68 y.o. M) Primary Care Provider: [name] Other Clinician: Referring Provider: Treating Provider/Extender: Marolyn Nest [name] [name] in Treatment: 11 Constitutional . . . . no acute distress. Respiratory Normal work of breathing on room air.. Notes 04/11/2023: Both wounds continues to contract. The left lateral leg wound is substantially smaller and more superficial. Both have some slough buildup. Edema control continues to be excellent bilaterally. Electronic Signature(s) Signed: 04/11/2023 11:27:17 AM By: Marolyn Nest MD FACS Entered By: Marolyn Nest on 04/11/2023  11:27:17 -------------------------------------------------------------------------------- Physician Orders Details Patient Name: Date of Service: [name], [name] 04/11/2023 10:15 A M Medical Record Number: [id number] Patient Account Number: [id number] Date of Birth/Sex: Treating RN: 1955/05/12 (68 y.o. [name] [name] Primary Care Provider: [name] Other Clinician: Referring Provider: Treating Provider/Extender: Marolyn Nest [name] [name] in Treatment: 11 The following information was scribed by: Boehlein, Linda The information was scribed for: Lary, Eckardt ([id number]) [id number] Page 5 of 10 Verbal / Phone Orders: No Diagnosis Coding ICD-10 Coding Code Description [phone] Non-pressure chronic ulcer of other part of right lower leg with fat layer exposed L97.822 Non-pressure chronic ulcer of other part of left lower leg with fat layer exposed I87.333 Chronic venous hypertension (idiopathic) with ulcer and inflammation of bilateral lower extremity I73.9 Peripheral vascular disease, unspecified Follow-up Appointments ppointment in 1 week. - Dr. Marolyn 1/17 @ 815 am Return A Anesthetic (In clinic) Topical Lidocaine  4% applied to wound bed Bathing/ Shower/ Hygiene May shower with protection but do not get wound dressing(s) wet. Protect dressing(s) with water  repellant cover (for example, large plastic bag) or a cast cover and may then take shower. Edema Control - Orders / Instructions Elevate legs to the level of the heart or above for 30 minutes daily and/or when sitting for 3-4 times a day throughout the day. Avoid standing for long periods of time. Exercise regularly Additional Orders / Instructions Follow Nutritious Diet - vitamin C 500 mg start once a day and if tolerated then twice a day, zinc 30-50 mg once daily, protein shakes daily Wound Treatment Wound #1 - Lower Leg Wound Laterality: Right,  Lateral Cleanser: Soap and Water  1 x Per Week/30 Days Discharge Instructions: in  clinic Cleanser: Wound Cleanser 1 x Per Week/30 Days Peri-Wound Care: Sween Lotion (Moisturizing lotion) 1 x Per Week/30 Days Discharge Instructions: Apply moisturizing lotion as directed Topical: Gentamicin 1 x Per Week/30 Days Discharge Instructions: As directed by physician Topical: Mupirocin Ointment 1 x Per Week/30 Days Discharge Instructions: Apply Mupirocin (Bactroban) as instructed Topical: Skintegrity Hydrogel 4 (oz) 1 x Per Week/30 Days Discharge Instructions: Apply hydrogel as directed Prim Dressing: Promogran Prisma Matrix, 4.34 (sq in) (silver  collagen) 1 x Per Week/30 Days ary Discharge Instructions: Moisten collagen with saline or hydrogel Secondary Dressing: Optifoam Non-Adhesive Dressing, 4x4 in 1 x Per Week/30 Days Discharge Instructions: Apply over primary dressing as directed. Compression Wrap: Urgo K2 Lite, (equivalent to a 3 layer) two layer compression system, regular 1 x Per Week/30 Days Discharge Instructions: Apply Urgo K2 Lite as directed (alternative to 3 layer compression). Wound #2 - Lower Leg Wound Laterality: Left, Lateral Cleanser: Soap and Water  1 x Per Week/30 Days Discharge Instructions: May shower and wash wound with dial antibacterial soap and water  prior to dressing change. Cleanser: Wound Cleanser 1 x Per Week/30 Days Discharge Instructions: Cleanse the wound with wound cleanser prior to applying a clean dressing using gauze sponges, not tissue or cotton balls. Peri-Wound Care: Sween Lotion (Moisturizing lotion) 1 x Per Week/30 Days Discharge Instructions: Apply moisturizing lotion as directed Topical: Gentamicin 1 x Per Week/30 Days Discharge Instructions: As directed by physician Topical: Mupirocin Ointment 1 x Per Week/30 Days Discharge Instructions: Apply Mupirocin (Bactroban) as instructed Topical: Skintegrity Hydrogel 4 (oz) 1 x Per Week/30 Days Discharge  Instructions: Apply hydrogel as directed [name], [name] ([id number]) [id number] Page 6 of 10 Prim Dressing: Promogran Prisma Matrix, 4.34 (sq in) (silver  collagen) 1 x Per Week/30 Days ary Discharge Instructions: Moisten collagen with saline or hydrogel Secondary Dressing: Optifoam Non-Adhesive Dressing, 4x4 in 1 x Per Week/30 Days Discharge Instructions: Apply over primary dressing as directed. Compression Wrap: Urgo K2 Lite, (equivalent to a 3 layer) two layer compression system, regular 1 x Per Week/30 Days Discharge Instructions: Apply Urgo K2 Lite as directed (alternative to 3 layer compression). Electronic Signature(s) Signed: 04/11/2023 12:19:00 PM By: Marolyn Nest MD FACS Entered By: Marolyn Nest on 04/11/2023 11:27:30 -------------------------------------------------------------------------------- Problem List Details Patient Name: Date of Service: [name], [name] 04/11/2023 10:15 A M Medical Record Number: [id number] Patient Account Number: [id number] Date of Birth/Sex: Treating RN: Jun 09, 1955 (68 y.o. [name] [name] Primary Care Provider: [name] Other Clinician: Referring Provider: Treating Provider/Extender: Marolyn Nest [name] [name] in Treatment: 11 Active Problems ICD-10 Encounter Code Description Active Date MDM Diagnosis L97.812 Non-pressure chronic ulcer of other part of right lower leg with fat layer 01/23/2023 No Yes exposed L97.822 Non-pressure chronic ulcer of other part of left lower leg with fat layer 01/23/2023 No Yes exposed I87.333 Chronic venous hypertension (idiopathic) with ulcer and inflammation of 01/23/2023 No Yes bilateral lower extremity I73.9 Peripheral vascular disease, unspecified 01/23/2023 No Yes Inactive Problems Resolved Problems Electronic Signature(s) Signed: 04/11/2023 11:22:23 AM By: Marolyn Nest MD FACS Entered By: Marolyn Nest on 04/11/2023 11:22:23 Progress Note  Details -------------------------------------------------------------------------------- [name] ([id number]) [id number] Page 7 of 10 Patient Name: Date of Service: [name] [name] [name] 04/11/2023 10:15 A M Medical Record Number: [id number] Patient Account Number: [id number] Date of Birth/Sex: Treating RN: Apr 12, 1955 (68 y.o. M) Primary Care Provider: [name] Other Clinician: Referring Provider: Treating Provider/Extender: Marolyn Nest [name] [name] in Treatment: 11 Subjective Chief Complaint Information obtained from Patient Patient presents for  treatment of open ulcers due to venous insufficiency History of Present Illness (HPI) ADMISSION 01/23/2023 ***VASCULAR STUDIES:*** ABIs 11/2021: +-------+-----------+-----------+------------+------------+ ABI/TBIT oday's ABIT oday's TBIPrevious ABIPrevious TBI +-------+-----------+-----------+------------+------------+ Right 1.08 0.35 0.82 0.29  +-------+-----------+-----------+------------+------------+ Left 0.97 0.27 0.88 0.27  +-------+-----------+-----------+------------+------------+ Venous reflux 03/2021 Venous Reflux Times +--------------+---------+------+-----------+------------+--------+ RIGHT Reflux NoRefluxReflux TimeDiameter cmsComments    Yes     +--------------+---------+------+-----------+------------+--------+ CFV   yes  >1 second    +--------------+---------+------+-----------+------------+--------+ FV prox   yes  >1 second    +--------------+---------+------+-----------+------------+--------+ FV mid   yes  >1 second    +--------------+---------+------+-----------+------------+--------+ FV dist   yes  >1 second    +--------------+---------+------+-----------+------------+--------+ Popliteal   yes  >1 second    +--------------+---------+------+-----------+------------+--------+ GSV at SFJ   yes   >500 ms  0.736   +--------------+---------+------+-----------+------------+--------+ GSV prox thigh  yes  >500 ms  0.591   +--------------+---------+------+-----------+------------+--------+ GSV mid thigh   yes  >500 ms  0.591   +--------------+---------+------+-----------+------------+--------+ GSV dist thigh  yes  >500 ms  0.546   +--------------+---------+------+-----------+------------+--------+ GSV at knee no    0.592   +--------------+---------+------+-----------+------------+--------+ GSV prox calf     0.441   +--------------+---------+------+-----------+------------+--------+ GSV mid calf     0.461   +--------------+---------+------+-----------+------------+--------+ SSV Pop Fossa no    0.406   +--------------+---------+------+-----------+------------+--------+ SSV prox calf   yes  >500 ms  0.379   +--------------+---------+------+-----------+------------+--------+ SSV mid calf     0.379   +--------------+---------+------+-----------+------------+--------+ Summary: Right: - No evidence of deep vein thrombosis seen in the right lower extremity, from the common femoral through the popliteal veins. - No evidence of superficial venous thrombosis in the right lower extremity. - Venous reflux is noted in the right common femoral vein. - Venous reflux is noted in the right sapheno-femoral junction. - Venous reflux is noted in the right greater saphenous vein in the thigh. - Venous reflux is noted in the right femoral vein. - Venous reflux is noted in the right popliteal vein. - Venous reflux is noted in the right short saphenous vein. This is a 68 year old smoker with a significant history of cardiac and peripheral vascular disease. He has had issues with venous ulceration on his right leg that have resolved with local care in the past, but the most recent wound has been present for a couple of months and has not  healed despite treatment with Silvadene  cream and home application of Unna boots. He also has developed 2 small ulcers on the left that have presented within the past couple of weeks. Of note, his right greater saphenous vein was harvested in 2023 for femoral above-knee popliteal bypass. He was referred here by his primary care provider for further evaluation and management of his lower extremity venous ulcers. 01/31/2023: The left medial lower leg wound is healed. The left lateral and right lateral lower leg wounds are both smaller. They have slough accumulation on their surfaces. Edema control is excellent. [name], [name] ([id number]) [id number] Page 8 of 10 02/05/2023: Both wounds are just slightly smaller. They are cleaner, however, with a bit of slough accumulation on the surfaces. Edema control remains good. 02/14/2023: The wounds are smaller again today. They have slough on the surface. Edema control is good. 02/19/2023: The wounds measured about the same size today, but there is more epithelialization occurring in the midportion of the wounds. There is slough accumulation once again. Edema control remains good. 02/25/2023: The left lateral leg wound measures slightly longer today. Both wounds have more slough on them this week then on prior occasions.  Edema control is good. No erythema, induration, or malodor to suggest infection. 03/18/2023: Both wounds measured smaller today. There is an enlarging bud of epithelialized tissue in the center of the right leg wound. There is slough on the surface of both. Edema control is good. 03/28/2023: The wound measurements did not change today, but on visual inspection, both wounds appear narrower. There is slough accumulation on both surfaces. Good edema control bilaterally. 04/04/2023: Both wounds measured smaller today, particularly the left lateral leg wound. There is slough accumulation present. Good edema control  bilaterally. 04/11/2023: Both wounds continues to contract. The left lateral leg wound is substantially smaller and more superficial. Both have some slough buildup. Edema control continues to be excellent bilaterally. Objective Constitutional no acute distress. Vitals Time Taken: 10:11 AM, Height: 68 in, Weight: 208 lbs, BMI: 31.6, Temperature: 98.1 F, Pulse: 81 bpm, Respiratory Rate: 20 breaths/min, Blood Pressure: 132/62 mmHg. Respiratory Normal work of breathing on room air.. General Notes: 04/11/2023: Both wounds continues to contract. The left lateral leg wound is substantially smaller and more superficial. Both have some slough buildup. Edema control continues to be excellent bilaterally. Integumentary (Hair, Skin) Wound #1 status is Open. Original cause of wound was Gradually Appeared. The date acquired was: 10/31/2022. The wound has been in treatment 11 weeks. The wound is located on the Right,Lateral Lower Leg. The wound measures 2.7cm length x 1cm width x 0.1cm depth; 2.121cm^2 area and 0.212cm^3 volume. There is Fat Layer (Subcutaneous Tissue) exposed. There is no tunneling or undermining noted. There is a medium amount of serosanguineous drainage noted. The wound margin is distinct with the outline attached to the wound base. There is medium (34-66%) red granulation within the wound bed. There is a medium (34- 66%) amount of necrotic tissue within the wound bed including Adherent Slough. The periwound skin appearance had no abnormalities noted for moisture. The periwound skin appearance exhibited: Scarring, Hemosiderin Staining. The periwound skin appearance did not exhibit: Callus, Crepitus, Excoriation, Induration, Rash, Atrophie Blanche, Cyanosis, Ecchymosis, Mottled, Pallor, Rubor, Erythema. Periwound temperature was noted as No Abnormality. Wound #2 status is Open. Original cause of wound was Gradually Appeared. The date acquired was: 01/13/2023. The wound has been in treatment 11  weeks. The wound is located on the Left,Lateral Lower Leg. The wound measures 1.2cm length x 0.3cm width x 0.1cm depth; 0.283cm^2 area and 0.028cm^3 volume. There is Fat Layer (Subcutaneous Tissue) exposed. There is no tunneling or undermining noted. There is a medium amount of serosanguineous drainage noted. The wound margin is distinct with the outline attached to the wound base. There is medium (34-66%) red granulation within the wound bed. There is a medium (34-66%) amount of necrotic tissue within the wound bed including Adherent Slough. The periwound skin appearance had no abnormalities noted for moisture. The periwound skin appearance exhibited: Scarring, Hemosiderin Staining. The periwound skin appearance did not exhibit: Callus, Crepitus, Excoriation, Induration, Rash, Atrophie Blanche, Cyanosis, Ecchymosis, Mottled, Pallor, Rubor, Erythema. Periwound temperature was noted as No Abnormality. Assessment Active Problems ICD-10 Non-pressure chronic ulcer of other part of right lower leg with fat layer exposed Non-pressure chronic ulcer of other part of left lower leg with fat layer exposed Chronic venous hypertension (idiopathic) with ulcer and inflammation of bilateral lower extremity Peripheral vascular disease, unspecified Procedures Wound #1 Pre-procedure diagnosis of Wound #1 is a Venous Leg Ulcer located on the Right,Lateral Lower Leg .Severity of Tissue Pre Debridement is: Fat layer exposed. There was a Excisional Skin/Subcutaneous Tissue Debridement with a total  area of 2.12 sq cm performed by Marolyn Nest, MD. With the following instrument(s): Curette to remove Viable and Non-Viable tissue/material. Material removed includes Subcutaneous Tissue and Slough and after achieving pain control using Lidocaine  5% topical ointment. No specimens were taken. A time out was conducted at 10:40, prior to the start of the procedure. A Minimum amount of bleeding was controlled with Pressure.  The procedure was tolerated well with a pain level of 0 throughout and a pain level of 0 following the [place], [name] ([id number]) [id number] Page 9 of 10 procedure. Post Debridement Measurements: 2.7cm length x 1cm width x 0.1cm depth; 0.212cm^3 volume. Character of Wound/Ulcer Post Debridement is improved. Severity of Tissue Post Debridement is: Fat layer exposed. Post procedure Diagnosis Wound #1: Same as Pre-Procedure Pre-procedure diagnosis of Wound #1 is a Venous Leg Ulcer located on the Right,Lateral Lower Leg . There was a Double Layer Compression Therapy Procedure by Gregory Handing, RN. Post procedure Diagnosis Wound #1: Same as Pre-Procedure Notes: urgo lites. Wound #2 Pre-procedure diagnosis of Wound #2 is a Venous Leg Ulcer located on the Left,Lateral Lower Leg .Severity of Tissue Pre Debridement is: Fat layer exposed. There was a Excisional Skin/Subcutaneous Tissue Debridement with a total area of 0.28 sq cm performed by Marolyn Nest, MD. With the following instrument(s): Curette to remove Viable and Non-Viable tissue/material. Material removed includes Subcutaneous Tissue and Slough and after achieving pain control using Lidocaine  5% topical ointment. No specimens were taken. A time out was conducted at 10:40, prior to the start of the procedure. A Minimum amount of bleeding was controlled with Pressure. The procedure was tolerated well with a pain level of 0 throughout and a pain level of 0 following the procedure. Post Debridement Measurements: 1.2cm length x 0.3cm width x 0.1cm depth; 0.028cm^3 volume. Character of Wound/Ulcer Post Debridement is improved. Severity of Tissue Post Debridement is: Fat layer exposed. Post procedure Diagnosis Wound #2: Same as Pre-Procedure Pre-procedure diagnosis of Wound #2 is a Venous Leg Ulcer located on the Left,Lateral Lower Leg . There was a Double Layer Compression Therapy Procedure by Gregory Handing,  RN. Post procedure Diagnosis Wound #2: Same as Pre-Procedure Notes: urgo lites. Plan Follow-up Appointments: Return Appointment in 1 week. - Dr. Marolyn 1/17 @ 815 am Anesthetic: (In clinic) Topical Lidocaine  4% applied to wound bed Bathing/ Shower/ Hygiene: May shower with protection but do not get wound dressing(s) wet. Protect dressing(s) with water  repellant cover (for example, large plastic bag) or a cast cover and may then take shower. Edema Control - Orders / Instructions: Elevate legs to the level of the heart or above for 30 minutes daily and/or when sitting for 3-4 times a day throughout the day. Avoid standing for long periods of time. Exercise regularly Additional Orders / Instructions: Follow Nutritious Diet - vitamin C 500 mg start once a day and if tolerated then twice a day, zinc 30-50 mg once daily, protein shakes daily WOUND #1: - Lower Leg Wound Laterality: Right, Lateral Cleanser: Soap and Water  1 x Per Week/30 Days Discharge Instructions: in clinic Cleanser: Wound Cleanser 1 x Per Week/30 Days Peri-Wound Care: Sween Lotion (Moisturizing lotion) 1 x Per Week/30 Days Discharge Instructions: Apply moisturizing lotion as directed Topical: Gentamicin 1 x Per Week/30 Days Discharge Instructions: As directed by physician Topical: Mupirocin Ointment 1 x Per Week/30 Days Discharge Instructions: Apply Mupirocin (Bactroban) as instructed Topical: Skintegrity Hydrogel 4 (oz) 1 x Per Week/30 Days Discharge Instructions: Apply hydrogel as directed Prim Dressing: Promogran Prisma Matrix,  4.34 (sq in) (silver  collagen) 1 x Per Week/30 Days ary Discharge Instructions: Moisten collagen with saline or hydrogel Secondary Dressing: Optifoam Non-Adhesive Dressing, 4x4 in 1 x Per Week/30 Days Discharge Instructions: Apply over primary dressing as directed. Com pression Wrap: Urgo K2 Lite, (equivalent to a 3 layer) two layer compression system, regular 1 x Per Week/30 Days Discharge  Instructions: Apply Urgo K2 Lite as directed (alternative to 3 layer compression). WOUND #2: - Lower Leg Wound Laterality: Left, Lateral Cleanser: Soap and Water  1 x Per Week/30 Days Discharge Instructions: May shower and wash wound with dial antibacterial soap and water  prior to dressing change. Cleanser: Wound Cleanser 1 x Per Week/30 Days Discharge Instructions: Cleanse the wound with wound cleanser prior to applying a clean dressing using gauze sponges, not tissue or cotton balls. Peri-Wound Care: Sween Lotion (Moisturizing lotion) 1 x Per Week/30 Days Discharge Instructions: Apply moisturizing lotion as directed Topical: Gentamicin 1 x Per Week/30 Days Discharge Instructions: As directed by physician Topical: Mupirocin Ointment 1 x Per Week/30 Days Discharge Instructions: Apply Mupirocin (Bactroban) as instructed Topical: Skintegrity Hydrogel 4 (oz) 1 x Per Week/30 Days Discharge Instructions: Apply hydrogel as directed Prim Dressing: Promogran Prisma Matrix, 4.34 (sq in) (silver  collagen) 1 x Per Week/30 Days ary Discharge Instructions: Moisten collagen with saline or hydrogel Secondary Dressing: Optifoam Non-Adhesive Dressing, 4x4 in 1 x Per Week/30 Days Discharge Instructions: Apply over primary dressing as directed. Com pression Wrap: Urgo K2 Lite, (equivalent to a 3 layer) two layer compression system, regular 1 x Per Week/30 Days Discharge Instructions: Apply Urgo K2 Lite as directed (alternative to 3 layer compression). 04/11/2023: Both wounds continues to contract. The left lateral leg wound is substantially smaller and more superficial. Both have some slough buildup. Edema control continues to be excellent bilaterally. RYE, DECOSTE ([id number]) [id number] Page 10 of 10 I used a curette to debride slough and subcutaneous tissue from each of the wounds. We will continue to use Prisma silver  collagen with hydrogel, covered with Optifoam and bilateral  Urgo lite compression. Follow-up in 1 week. Electronic Signature(s) Signed: 04/11/2023 11:32:41 AM By: Marolyn Nest MD FACS Entered By: Marolyn Nest on 04/11/2023 11:32:41 -------------------------------------------------------------------------------- SuperBill Details Patient Name: Date of Service: [name], [name] 04/11/2023 Medical Record Number: [id number] Patient Account Number: [id number] Date of Birth/Sex: Treating RN: 05/15/1955 (68 y.o. M) Primary Care Provider: [name] Other Clinician: Referring Provider: Treating Provider/Extender: Marolyn Nest [name] [name] in Treatment: 11 Diagnosis Coding ICD-10 Codes Code Description [phone] Non-pressure chronic ulcer of other part of right lower leg with fat layer exposed L97.822 Non-pressure chronic ulcer of other part of left lower leg with fat layer exposed I87.333 Chronic venous hypertension (idiopathic) with ulcer and inflammation of bilateral lower extremity I73.9 Peripheral vascular disease, unspecified Facility Procedures : CPT4 Code: [id number] Description: 11042 - DEB SUBQ TISSUE 20 SQ CM/< ICD-10 Diagnosis Description L97.812 Non-pressure chronic ulcer of other part of right lower leg with fat layer expos L97.822 Non-pressure chronic ulcer of other part of left lower leg with fat layer expose  I87.333 Chronic venous hypertension (idiopathic) with ulcer and inflammation of bilatera I73.9 Peripheral vascular disease, unspecified Modifier: ed d l lower extremity Quantity: 1 Physician Procedures : CPT4 Code Description Modifier [id number] 11042 - WC PHYS SUBQ TISS 20 SQ CM ICD-10 Diagnosis Description L97.812 Non-pressure chronic ulcer of other part of right lower leg with fat layer exposed L97.822 Non-pressure chronic ulcer of other part of left  lower leg with fat layer exposed  P12.666 Chronic venous hypertension (idiopathic) with ulcer and inflammation of bilateral lower extremity I73.9 Peripheral  vascular disease, unspecified Quantity: 1 Electronic Signature(s) Signed: 04/11/2023 11:32:52 AM By: Marolyn Nest MD FACS Entered By: Marolyn Nest on 04/11/2023 11:32:52

## 2023-04-11 NOTE — Progress Notes (Signed)
 [name] ([id number]) [id number] Page 1 of 11 Visit Report for 04/11/2023 Arrival Information Details Patient Name: Date of Service: [name] 04/11/2023 10:15 A M Medical Record Number: [id number] Patient Account Number: [id number] Date of Birth/Sex: Treating RN: 10-25-1955 (68 y.o. M) Primary Care Rusell Meneely: [name] Other Clinician: Referring Eilish Mcdaniel: Treating Kerigan Narvaez/Extender: [name] [name] in Treatment: 11 Visit Information History Since Last Visit Added or deleted any medications: No Patient Arrived: Ambulatory Any new allergies or adverse reactions: No Arrival Time: 10:10 Had a fall or experienced change in No Accompanied By: wife activities of daily living that may affect Transfer Assistance: None risk of falls: Patient Identification Verified: Yes Signs or symptoms of abuse/neglect since last visito No Secondary Verification Process Completed: Yes Hospitalized since last visit: No Patient Requires Transmission-Based Precautions: No Implantable device outside of the clinic excluding No Patient Has Alerts: No cellular tissue based products placed in the center since last visit: Has Dressing in Place as Prescribed: Yes Has Compression in Place as Prescribed: Yes Pain Present Now: No Electronic Signature(s) Signed: 04/11/2023 12:31:59 PM By: Merleen Handing RN, BSN Entered By: Boehlein, Linda on 04/11/2023 07:29:05 -------------------------------------------------------------------------------- Compression Therapy Details Patient Name: Date of Service: [name], [name] 04/11/2023 10:15 A M Medical Record Number: [id number] Patient Account Number: [id number] Date of Birth/Sex: Treating RN: 12-08-1955 (68 y.o. [name] Primary Care Devarious Pavek: [name] Other Clinician: Referring Sabrea Sankey: Treating Ketzaly Cardella/Extender: [name] [name] in Treatment: 11 Compression  Therapy Performed for Wound Assessment: Wound #1 Right,Lateral Lower Leg Performed By: Clinician Merleen Handing, RN Compression Type: Double Layer Post Procedure Diagnosis Same as Pre-procedure Notes aubry grieve Electronic Signature(s) Signed: 04/11/2023 12:31:59 PM By: Merleen Handing RN, BSN Entered By: Merleen Handing on 04/11/2023 07:36:28 [name] ([id number]) [id number] Page 2 of 11 -------------------------------------------------------------------------------- Compression Therapy Details Patient Name: Date of Service: [name] 04/11/2023 10:15 A M Medical Record Number: [id number] Patient Account Number: [id number] Date of Birth/Sex: Treating RN: 1955-04-11 (68 y.o. [name] Primary Care Jakyiah Briones: [name] Other Clinician: Referring Aryanne Gilleland: Treating Janaya Broy/Extender: [name] [name] in Treatment: 11 Compression Therapy Performed for Wound Assessment: Wound #2 Left,Lateral Lower Leg Performed By: Clinician Merleen Handing, RN Compression Type: Double Layer Post Procedure Diagnosis Same as Pre-procedure Notes aubry grieve Electronic Signature(s) Signed: 04/11/2023 12:31:59 PM By: Merleen Handing RN, BSN Entered By: Merleen Handing on 04/11/2023 07:36:28 -------------------------------------------------------------------------------- Encounter Discharge Information Details Patient Name: Date of Service: [name], [name] 04/11/2023 10:15 A M Medical Record Number: [id number] Patient Account Number: [id number] Date of Birth/Sex: Treating RN: 1955/10/06 (68 y.o. [name] Primary Care Zeppelin Beckstrand: [name] Other Clinician: Referring Jiali Linney: Treating Darian Ace/Extender: [name] [name] in Treatment: 11 Encounter Discharge Information Items Post Procedure Vitals Discharge Condition: Stable Temperature (F): 98.1 Ambulatory Status: Ambulatory Pulse (bpm):  81 Discharge Destination: Home Respiratory Rate (breaths/min): 18 Transportation: Private Auto Blood Pressure (mmHg): 132/62 Accompanied By: spouse Schedule Follow-up Appointment: Yes Clinical Summary of Care: Patient Declined Electronic Signature(s) Signed: 04/11/2023 12:31:59 PM By: Merleen Handing RN, BSN Entered By: Merleen Handing on 04/11/2023 08:01:09 -------------------------------------------------------------------------------- Lower Extremity Assessment Details Patient Name: Date of Service: [name], [name] 04/11/2023 10:15 A M Medical Record Number: [id number] Patient Account Number: [id number] Date of Birth/Sex: Treating RN: 1955/09/07 (68 y.o. [name] Primary Care Armandina Iman: [name] Other Clinician: Referring Xaivier Malay: Treating Kura Bethards/Extender: [name] Glidden, [name] ([id number]) [id number] Page 3 of  11 Weeks in Treatment: 11 Edema Assessment Assessed: [Left: No] [Right: No] Edema: [Left: Yes] [Right: Yes] Calf Left: Right: Point of Measurement: From Medial Instep 37 cm 35.5 cm Ankle Left: Right: Point of Measurement: From Medial Instep 23.5 cm 23.5 cm Vascular Assessment Pulses: Dorsalis Pedis Palpable: [Left:No] [Right:No] Extremity colors, hair growth, and conditions: Extremity Color: [Left:Hyperpigmented] [Right:Hyperpigmented] Hair Growth on Extremity: [Left:No] [Right:No] Temperature of Extremity: [Left:Cool] [Right:Cool] Capillary Refill: [Left:< 3 seconds] [Right:< 3 seconds] Dependent Rubor: [Left:No Yes] [Right:No Yes] Electronic Signature(s) Signed: 04/11/2023 12:31:59 PM By: Merleen Handing RN, BSN Entered By: Merleen Handing on 04/11/2023 07:31:22 -------------------------------------------------------------------------------- Multi Wound Chart Details Patient Name: Date of Service: [name], [name] 04/11/2023 10:15 A M Medical Record Number: [id number] Patient Account  Number: [id number] Date of Birth/Sex: Treating RN: 1955/07/15 (68 y.o. M) Primary Care Florance Paolillo: [name] Other Clinician: Referring Kaytelynn Scripter: Treating Cherylann Hobday/Extender: [name] [name] in Treatment: 11 Vital Signs Height(in): 68 Pulse(bpm): 81 Weight(lbs): 208 Blood Pressure(mmHg): 132/62 Body Mass Index(BMI): 31.6 Temperature(F): 98.1 Respiratory Rate(breaths/min): 20 [1:Photos:] [N/A:N/A] Right, Lateral Lower Leg Left, Lateral Lower Leg N/A Wound Location: Gradually Appeared Gradually Appeared N/A Wounding Event: Venous Leg Ulcer Venous Leg Ulcer N/A Primary Etiology: Chronic Obstructive Pulmonary Chronic Obstructive Pulmonary N/A Comorbid History: Disease (COPD), Angina, Arrhythmia, Disease (COPD), Angina, Arrhythmia, Coronary Artery Disease, Coronary Artery Disease, [name], [name] ([id number]) [id number] Page 4 of 11 Hypertension, Myocardial Infarction, Hypertension, Myocardial Infarction, Peripheral Venous Disease Peripheral Venous Disease 10/31/2022 01/13/2023 N/A Date Acquired: 11 11 N/A Weeks of Treatment: Open Open N/A Wound Status: No No N/A Wound Recurrence: 2.7x1x0.1 1.2x0.3x0.1 N/A Measurements L x W x D (cm) 2.121 0.283 N/A A (cm) : rea 0.212 0.028 N/A Volume (cm) : 77.50% 74.80% N/A % Reduction in A rea: 77.50% 75.00% N/A % Reduction in Volume: Full Thickness Without Exposed Full Thickness Without Exposed N/A Classification: Support Structures Support Structures Medium Medium N/A Exudate A mount: Serosanguineous Serosanguineous N/A Exudate Type: red, brown red, brown N/A Exudate Color: Distinct, outline attached Distinct, outline attached N/A Wound Margin: Medium (34-66%) Medium (34-66%) N/A Granulation A mount: Red Red N/A Granulation Quality: Medium (34-66%) Medium (34-66%) N/A Necrotic A mount: Fat Layer (Subcutaneous Tissue): Yes Fat Layer (Subcutaneous Tissue): Yes N/A Exposed  Structures: Fascia: No Tendon: No Muscle: No Joint: No Bone: No Small (1-33%) Small (1-33%) N/A Epithelialization: Debridement - Excisional Debridement - Excisional N/A Debridement: Pre-procedure Verification/Time Out 10:40 10:40 N/A Taken: Lidocaine  5% topical ointment Lidocaine  5% topical ointment N/A Pain Control: Subcutaneous, Slough Subcutaneous, Slough N/A Tissue Debrided: Skin/Subcutaneous Tissue Skin/Subcutaneous Tissue N/A Level: 2.12 0.28 N/A Debridement A (sq cm): rea Curette Curette N/A Instrument: Minimum Minimum N/A Bleeding: Pressure Pressure N/A Hemostasis A chieved: 0 0 N/A Procedural Pain: 0 0 N/A Post Procedural Pain: Procedure was tolerated well Procedure was tolerated well N/A Debridement Treatment Response: 2.7x1x0.1 1.2x0.3x0.1 N/A Post Debridement Measurements L x W x D (cm) 0.212 0.028 N/A Post Debridement Volume: (cm) Scarring: Yes Scarring: Yes N/A Periwound Skin Texture: Excoriation: No Excoriation: No Induration: No Induration: No Callus: No Callus: No Crepitus: No Crepitus: No Rash: No Rash: No Maceration: No Maceration: No N/A Periwound Skin Moisture: Dry/Scaly: No Dry/Scaly: No Hemosiderin Staining: Yes Hemosiderin Staining: Yes N/A Periwound Skin Color: Atrophie Blanche: No Atrophie Blanche: No Cyanosis: No Cyanosis: No Ecchymosis: No Ecchymosis: No Erythema: No Erythema: No Mottled: No Mottled: No Pallor: No Pallor: No Rubor: No Rubor: No No Abnormality No Abnormality N/A Temperature: Compression Therapy Compression Therapy N/A Procedures Performed: Debridement  Debridement Treatment Notes Wound #1 (Lower Leg) Wound Laterality: Right, Lateral Cleanser Soap and Water  Discharge Instruction: in clinic Wound Cleanser Peri-Wound Care Sween Lotion (Moisturizing lotion) Discharge Instruction: Apply moisturizing lotion as directed Topical Gentamicin Discharge Instruction: As directed by  physician Mupirocin Ointment Discharge Instruction: Apply Mupirocin (Bactroban) as instructed Skintegrity Hydrogel 4 (oz) Discharge Instruction: Apply hydrogel as directed [name], [name] ([id number]) [id number] Page 5 of 11 Primary Dressing Promogran Prisma Matrix, 4.34 (sq in) (silver  collagen) Discharge Instruction: Moisten collagen with saline or hydrogel Secondary Dressing Optifoam Non-Adhesive Dressing, 4x4 in Discharge Instruction: Apply over primary dressing as directed. Secured With Compression Wrap Urgo K2 Lite, (equivalent to a 3 layer) two layer compression system, regular Discharge Instruction: Apply Urgo K2 Lite as directed (alternative to 3 layer compression). Compression Stockings Add-Ons Wound #2 (Lower Leg) Wound Laterality: Left, Lateral Cleanser Soap and Water  Discharge Instruction: May shower and wash wound with dial antibacterial soap and water  prior to dressing change. Wound Cleanser Discharge Instruction: Cleanse the wound with wound cleanser prior to applying a clean dressing using gauze sponges, not tissue or cotton balls. Peri-Wound Care Sween Lotion (Moisturizing lotion) Discharge Instruction: Apply moisturizing lotion as directed Topical Gentamicin Discharge Instruction: As directed by physician Mupirocin Ointment Discharge Instruction: Apply Mupirocin (Bactroban) as instructed Skintegrity Hydrogel 4 (oz) Discharge Instruction: Apply hydrogel as directed Primary Dressing Promogran Prisma Matrix, 4.34 (sq in) (silver  collagen) Discharge Instruction: Moisten collagen with saline or hydrogel Secondary Dressing Optifoam Non-Adhesive Dressing, 4x4 in Discharge Instruction: Apply over primary dressing as directed. Secured With Compression Wrap Urgo K2 Lite, (equivalent to a 3 layer) two layer compression system, regular Discharge Instruction: Apply Urgo K2 Lite as directed (alternative to 3 layer compression). Compression  Stockings Add-Ons Electronic Signature(s) Signed: 04/11/2023 11:22:34 AM By: Marolyn Nest MD FACS Entered By: Marolyn Nest on 04/11/2023 08:22:34 -------------------------------------------------------------------------------- Multi-Disciplinary Care Plan Details Patient Name: Date of Service: [name], [name] 04/11/2023 10:15 A CHRISTELLA Gregory, [name] ([id number]) [id number] Page 6 of 11 Medical Record Number: [id number] Patient Account Number: [id number] Date of Birth/Sex: Treating RN: 1956/03/02 (68 y.o. [name] Primary Care Jorie Zee: [name] Other Clinician: Referring Cristie Mckinney: Treating Nary Sneed/Extender: Marolyn Nest [name] [name] in Treatment: 11 Multidisciplinary Care Plan reviewed with physician Active Inactive Venous Leg Ulcer Nursing Diagnoses: Actual venous Insuffiency (use after diagnosis is confirmed) Knowledge deficit related to disease process and management Goals: Patient will maintain optimal edema control Date Initiated: 01/23/2023 Target Resolution Date: 05/30/2023 Goal Status: Active Patient/caregiver will verbalize understanding of disease process and disease management Date Initiated: 01/23/2023 Target Resolution Date: 05/30/2023 Goal Status: Active Interventions: Assess peripheral edema status every visit. Compression as ordered Provide education on venous insufficiency Treatment Activities: Therapeutic compression applied : 01/23/2023 Notes: Wound/Skin Impairment Nursing Diagnoses: Impaired tissue integrity Knowledge deficit related to ulceration/compromised skin integrity Goals: Patient/caregiver will verbalize understanding of skin care regimen Date Initiated: 01/23/2023 Target Resolution Date: 05/30/2023 Goal Status: Active Interventions: Assess ulceration(s) every visit Treatment Activities: Skin care regimen initiated : 01/23/2023 Topical wound management initiated :  01/23/2023 Notes: Electronic Signature(s) Signed: 04/11/2023 12:31:59 PM By: Merleen Handing RN, BSN Entered By: Merleen Handing on 04/11/2023 07:33:08 -------------------------------------------------------------------------------- Pain Assessment Details Patient Name: Date of Service: [name], [name] 04/11/2023 10:15 A M Medical Record Number: [id number] Patient Account Number: [id number] Date of Birth/Sex: Treating RN: 06/20/1955 (68 y.o. M) Primary Care Alaria Oconnor: [name] Other Clinician: Referring Tenee Wish: Treating Navil Kole/Extender: Marolyn Nest [name] [name] in Treatment: [address] [place] ([id number]) [id number] Page 7  of 11 Active Problems Location of Pain Severity and Description of Pain Patient Has Paino No Site Locations Rate the pain. Current Pain Level: 0 Pain Management and Medication Current Pain Management: Electronic Signature(s) Signed: 04/11/2023 12:31:59 PM By: Merleen Handing RN, BSN Entered By: Boehlein, Linda on 04/11/2023 07:29:35 -------------------------------------------------------------------------------- Patient/Caregiver Education Details Patient Name: Date of Service: [name], [name] 1/10/2025andnbsp10:15 A M Medical Record Number: [id number] Patient Account Number: [id number] Date of Birth/Gender: Treating RN: 1955-09-03 (68 y.o. [name] Primary Care Physician: [name] Other Clinician: Referring Physician: Treating Physician/Extender: [name] [name] in Treatment: 11 Education Assessment Education Provided To: Patient Education Topics Provided Venous: Methods: Explain/Verbal Responses: Reinforcements needed, State content correctly [employer]) Signed: 04/11/2023 12:31:59 PM By: Merleen Handing RN, BSN Entered By: Merleen Handing on 04/11/2023 07:33:29 [name] ([id number]) [id number] Page 8 of  11 -------------------------------------------------------------------------------- Wound Assessment Details Patient Name: Date of Service: [name] 04/11/2023 10:15 A M Medical Record Number: [id number] Patient Account Number: [id number] Date of Birth/Sex: Treating RN: March 20, 1956 (68 y.o. M) Primary Care Linkyn Gobin: [name] Other Clinician: Referring Aniel Hubble: Treating Zakee Deerman/Extender: [name] [name] in Treatment: 11 Wound Status Wound Number: 1 Primary Venous Leg Ulcer Etiology: Wound Location: Right, Lateral Lower Leg Wound Open Wounding Event: Gradually Appeared Status: Date Acquired: 10/31/2022 Comorbid Chronic Obstructive Pulmonary Disease (COPD), Angina, Weeks Of Treatment: 11 History: Arrhythmia, Coronary Artery Disease, Hypertension, Myocardial Clustered Wound: No Infarction, Peripheral Venous Disease Photos Wound Measurements Length: (cm) 2.7 Width: (cm) 1 Depth: (cm) 0.1 Area: (cm) 2.121 Volume: (cm) 0.212 % Reduction in Area: 77.5% % Reduction in Volume: 77.5% Epithelialization: Small (1-33%) Tunneling: No Undermining: No Wound Description Classification: Full Thickness Without Exposed Support Wound Margin: Distinct, outline attached Exudate Amount: Medium Exudate Type: Serosanguineous Exudate Color: red, brown Structures Foul Odor After Cleansing: No Slough/Fibrino Yes Wound Bed Granulation Amount: Medium (34-66%) Exposed Structure Granulation Quality: Red Fascia Exposed: No Necrotic Amount: Medium (34-66%) Fat Layer (Subcutaneous Tissue) Exposed: Yes Necrotic Quality: Adherent Slough Tendon Exposed: No Muscle Exposed: No Joint Exposed: No Bone Exposed: No Periwound Skin Texture Texture Color No Abnormalities Noted: No No Abnormalities Noted: No Callus: No Atrophie Blanche: No Crepitus: No Cyanosis: No Excoriation: No Ecchymosis: No Induration: No Erythema: No Rash: No Hemosiderin Staining:  Yes Scarring: Yes Mottled: No Pallor: No Moisture Rubor: No No Abnormalities Noted: Yes Temperature / Pain Temperature: No Abnormality Treatment Notes Wound #1 (Lower Leg) Wound Laterality: Right, Lateral CARREL, LEATHER ([id number]) [id number] Page 9 of 11 Cleanser Soap and Water  Discharge Instruction: in clinic Wound Cleanser Peri-Wound Care Sween Lotion (Moisturizing lotion) Discharge Instruction: Apply moisturizing lotion as directed Topical Gentamicin Discharge Instruction: As directed by physician Mupirocin Ointment Discharge Instruction: Apply Mupirocin (Bactroban) as instructed Skintegrity Hydrogel 4 (oz) Discharge Instruction: Apply hydrogel as directed Primary Dressing Promogran Prisma Matrix, 4.34 (sq in) (silver  collagen) Discharge Instruction: Moisten collagen with saline or hydrogel Secondary Dressing Optifoam Non-Adhesive Dressing, 4x4 in Discharge Instruction: Apply over primary dressing as directed. Secured With Compression Wrap Urgo K2 Lite, (equivalent to a 3 layer) two layer compression system, regular Discharge Instruction: Apply Urgo K2 Lite as directed (alternative to 3 layer compression). Compression Stockings Add-Ons Electronic Signature(s) Signed: 04/11/2023 12:31:59 PM By: Boehlein, Linda RN, BSN Entered By: Boehlein, Linda on 04/11/2023 07:37:40 -------------------------------------------------------------------------------- Wound Assessment Details Patient Name: Date of Service: [name], [name] 04/11/2023 10:15 A M Medical Record Number: [id number] Patient Account Number: [id number] Date of Birth/Sex: Treating RN: [birth year] (  68 y.o. M) Primary Care Alahna Dunne: [name] Other Clinician: Referring Danilynn Jemison: Treating Keia Rask/Extender: [name] [name] in Treatment: 11 Wound Status Wound Number: 2 Primary Venous Leg Ulcer Etiology: Wound Location: Left, Lateral Lower Leg Wound  Open Wounding Event: Gradually Appeared Status: Date Acquired: 01/13/2023 Comorbid Chronic Obstructive Pulmonary Disease (COPD), Angina, Weeks Of Treatment: 11 History: Arrhythmia, Coronary Artery Disease, Hypertension, Myocardial Clustered Wound: No Infarction, Peripheral Venous Disease Photos LARENZO, CAPLES ([id number]) [id number] Page 10 of 11 Wound Measurements Length: (cm) 1.2 Width: (cm) 0.3 Depth: (cm) 0.1 Area: (cm) 0.283 Volume: (cm) 0.028 % Reduction in Area: 74.8% % Reduction in Volume: 75% Epithelialization: Small (1-33%) Tunneling: No Undermining: No Wound Description Classification: Full Thickness Without Exposed Support Structures Wound Margin: Distinct, outline attached Exudate Amount: Medium Exudate Type: Serosanguineous Exudate Color: red, brown Foul Odor After Cleansing: No Slough/Fibrino Yes Wound Bed Granulation Amount: Medium (34-66%) Exposed Structure Granulation Quality: Red Fat Layer (Subcutaneous Tissue) Exposed: Yes Necrotic Amount: Medium (34-66%) Necrotic Quality: Adherent Slough Periwound Skin Texture Texture Color No Abnormalities Noted: No No Abnormalities Noted: No Callus: No Atrophie Blanche: No Crepitus: No Cyanosis: No Excoriation: No Ecchymosis: No Induration: No Erythema: No Rash: No Hemosiderin Staining: Yes Scarring: Yes Mottled: No Pallor: No Moisture Rubor: No No Abnormalities Noted: Yes Temperature / Pain Temperature: No Abnormality Treatment Notes Wound #2 (Lower Leg) Wound Laterality: Left, Lateral Cleanser Soap and Water  Discharge Instruction: May shower and wash wound with dial antibacterial soap and water  prior to dressing change. Wound Cleanser Discharge Instruction: Cleanse the wound with wound cleanser prior to applying a clean dressing using gauze sponges, not tissue or cotton balls. Peri-Wound Care Sween Lotion (Moisturizing lotion) Discharge Instruction: Apply moisturizing  lotion as directed Topical Gentamicin Discharge Instruction: As directed by physician Mupirocin Ointment Discharge Instruction: Apply Mupirocin (Bactroban) as instructed Skintegrity Hydrogel 4 (oz) Discharge Instruction: Apply hydrogel as directed Primary Dressing Promogran Prisma Matrix, 4.34 (sq in) (silver  collagen) Discharge Instruction: Moisten collagen with saline or hydrogel TYSHUN, TUCKERMAN ([id number]) [id number] Page 11 of 11 Secondary Dressing Optifoam Non-Adhesive Dressing, 4x4 in Discharge Instruction: Apply over primary dressing as directed. Secured With Compression Wrap Urgo K2 Lite, (equivalent to a 3 layer) two layer compression system, regular Discharge Instruction: Apply Urgo K2 Lite as directed (alternative to 3 layer compression). Compression Stockings Add-Ons Electronic Signature(s) Signed: 04/11/2023 12:31:59 PM By: Merleen Handing RN, BSN Entered By: Boehlein, Linda on 04/11/2023 07:37:57 -------------------------------------------------------------------------------- Vitals Details Patient Name: Date of Service: [name], [name] 04/11/2023 10:15 A M Medical Record Number: [id number] Patient Account Number: [id number] Date of Birth/Sex: Treating RN: [birth year] (68 y.o. M) Primary Care Itzelle Gains: [name] Other Clinician: Referring Marshun Duva: Treating Jeramie Scogin/Extender: [name] [name] in Treatment: 11 Vital Signs Time Taken: 10:11 Temperature (F): 98.1 Height (in): 68 Pulse (bpm): 81 Weight (lbs): 208 Respiratory Rate (breaths/min): 20 Body Mass Index (BMI): 31.6 Blood Pressure (mmHg): 132/62 Reference Range: 80 - 120 mg / dl Electronic Signature(s) Signed: 04/11/2023 12:31:59 PM By: Merleen Handing RN, BSN Entered By: Boehlein, Linda on 04/11/2023 07:29:26

## 2023-04-16 NOTE — Progress Notes (Signed)
 [name], [name] ([id number]) [id number] Page 1 of 9 Visit Report for 04/04/2023 Arrival Information Details Patient Name: Date of Service: [name] 04/04/2023 10:00 A M Medical Record Number: [id number] Patient Account Number: [id number] Date of Birth/Sex: Treating RN: 01-13-1956 (68 y.o. M) Primary Care [name]: [name] Other Clinician: Referring [name]: Treating [name]/Extender: [name] [name] in Treatment: 10 Visit Information History Since Last Visit Added or deleted any medications: No Patient Arrived: Ambulatory Any new allergies or adverse reactions: No Arrival Time: 10:05 Had a fall or experienced change in No Accompanied By: self activities of daily living that may affect Transfer Assistance: None risk of falls: Patient Identification Verified: Yes Signs or symptoms of abuse/neglect since last visito No Secondary Verification Process Completed: Yes Hospitalized since last visit: No Patient Requires Transmission-Based Precautions: No Implantable device outside of the clinic excluding No Patient Has Alerts: No cellular tissue based products placed in the center since last visit: Has Dressing in Place as Prescribed: Yes Pain Present Now: No Electronic Signature(s) Signed: 04/09/2023 4:28:52 PM By: Hadassah Belt Entered By: Hadassah Belt on 04/04/2023 10:05:46 -------------------------------------------------------------------------------- Compression Therapy Details Patient Name: Date of Service: [name], [name] 04/04/2023 10:00 A M Medical Record Number: [id number] Patient Account Number: [id number] Date of Birth/Sex: Treating RN: [birth year] (68 y.o. [name] Primary Care Josip Merolla: [name] Other Clinician: Referring Maly Lemarr: Treating Jaishon Krisher/Extender: [name] [name] in Treatment: 10 Compression Therapy Performed for Wound Assessment: Wound #1  Right,Lateral Lower Leg Performed By: Clinician Merleen Handing, RN Compression Type: Double Layer Post Procedure Diagnosis Same as Pre-procedure Notes urgo lite Electronic Signature(s) Signed: 04/04/2023 12:47:24 PM By: Merleen Handing RN, BSN Entered By: Merleen Handing on 04/04/2023 10:28:15 [name] ([id number]) [id number] Page 2 of 9 -------------------------------------------------------------------------------- Compression Therapy Details Patient Name: Date of Service: [name] 04/04/2023 10:00 A M Medical Record Number: [id number] Patient Account Number: [id number] Date of Birth/Sex: Treating RN: [birth year] (68 y.o. [name] Primary Care Isom Kochan: [name] Other Clinician: Referring Jemmie Ledgerwood: Treating Marieme Mcmackin/Extender: [name] [name] in Treatment: 10 Compression Therapy Performed for Wound Assessment: Wound #2 Left,Lateral Lower Leg Performed By: Clinician Merleen Handing, RN Compression Type: Double Layer Post Procedure Diagnosis Same as Pre-procedure Notes aubry schneider Electronic Signature(s) Signed: 04/04/2023 12:47:24 PM By: Merleen Handing RN, BSN Entered By: Boehlein, Linda on 04/04/2023 10:28:15 -------------------------------------------------------------------------------- Encounter Discharge Information Details Patient Name: Date of Service: [name], [name] 04/04/2023 10:00 A M Medical Record Number: [id number] Patient Account Number: [id number] Date of Birth/Sex: Treating RN: 09-Jun-1955 (68 y.o. NETTY [name] Primary Care Tyronza Happe: [name] Other Clinician: Referring Makyla Bye: Treating Szymon Foiles/Extender: [name] [name] in Treatment: 10 Encounter Discharge Information Items Post Procedure Vitals Discharge Condition: Stable Temperature (F): 98.2 Ambulatory Status: Cane Pulse (bpm): 81 Discharge Destination: Home Respiratory Rate (breaths/min):  20 Transportation: Private Auto Blood Pressure (mmHg): 121/61 Accompanied By: self Schedule Follow-up Appointment: Yes Clinical Summary of Care: Patient Declined Electronic Signature(s) Signed: 04/04/2023 11:01:12 AM By: [name] Entered By: [name] on 04/04/2023 11:00:44 -------------------------------------------------------------------------------- Lower Extremity Assessment Details Patient Name: Date of Service: [name] 04/04/2023 10:00 A M Medical Record Number: [id number] Patient Account Number: [id number] Date of Birth/Sex: Treating RN: [birth year] (68 y.o. M) Primary Care Binta Statzer: [name] Other Clinician: Referring Yidel Teuscher: Treating Ethell Blatchford/Extender: [name] McKenna, [name] ([id number]) [id number] Page 3 of 9 Weeks in Treatment: 10 Edema Assessment Assessed: [Left: No] [Right: No]  Edema: [Left: Yes] [Right: Yes] Calf Left: Right: Point of Measurement: From Medial Instep 39 cm 36 cm Ankle Left: Right: Point of Measurement: From Medial Instep 23.5 cm 23 cm Vascular Assessment Extremity colors, hair growth, and conditions: Extremity Color: [Left:Hyperpigmented] [Right:Hyperpigmented] Hair Growth on Extremity: [Left:No] [Right:No] Temperature of Extremity: [Left:Cool] [Right:Cool] Capillary Refill: [Left:< 3 seconds] [Right:< 3 seconds] Dependent Rubor: [Left:No Yes] [Right:No Yes] Toe Nail Assessment Left: Right: Thick: No No Discolored: No No Deformed: No No Improper Length and Hygiene: Yes Yes Electronic Signature(s) Signed: 04/14/2023 5:23:50 PM By: Wyn Iha Entered By: Wyn Iha on 04/04/2023 10:26:21 -------------------------------------------------------------------------------- Multi Wound Chart Details Patient Name: Date of Service: [name], [name] 04/04/2023 10:00 A M Medical Record Number: [id number] Patient Account Number: [id number] Date of Birth/Sex:  Treating RN: 03-27-1956 (68 y.o. M) Primary Care Emmer Lillibridge: [name] Other Clinician: Referring Lamekia Nolden: Treating Arnell Slivinski/Extender: [name] [name] in Treatment: 10 Vital Signs Height(in): 68 Pulse(bpm): 81 Weight(lbs): 208 Blood Pressure(mmHg): 121/61 Body Mass Index(BMI): 31.6 Temperature(F): 98.2 Respiratory Rate(breaths/min): 20 [1:Photos:] [N/A:N/A] Right, Lateral Lower Leg Left, Lateral Lower Leg N/A Wound Location: [name] ([id number]) [id number] Page 4 of 9 Gradually Appeared Gradually Appeared N/A Wounding Event: Venous Leg Ulcer Venous Leg Ulcer N/A Primary Etiology: Chronic Obstructive Pulmonary Chronic Obstructive Pulmonary N/A Comorbid History: Disease (COPD), Angina, Arrhythmia, Disease (COPD), Angina, Arrhythmia, Coronary Artery Disease, Coronary Artery Disease, Hypertension, Myocardial Infarction, Hypertension, Myocardial Infarction, Peripheral Venous Disease Peripheral Venous Disease 10/31/2022 01/13/2023 N/A Date Acquired: 10 10 N/A Weeks of Treatment: Open Open N/A Wound Status: No No N/A Wound Recurrence: 2.8x1.1x0.1 1.5x0.5x0.1 N/A Measurements L x W x D (cm) 2.419 0.589 N/A A (cm) : rea 0.242 0.059 N/A Volume (cm) : 74.30% 47.60% N/A % Reduction in A rea: 74.30% 47.30% N/A % Reduction in Volume: Full Thickness Without Exposed Full Thickness Without Exposed N/A Classification: Support Structures Support Structures Medium Medium N/A Exudate A mount: Serosanguineous Serosanguineous N/A Exudate Type: red, brown red, brown N/A Exudate Color: Distinct, outline attached Distinct, outline attached N/A Wound Margin: Small (1-33%) Small (1-33%) N/A Granulation A mount: Red Red N/A Granulation Quality: Large (67-100%) Large (67-100%) N/A Necrotic A mount: Fat Layer (Subcutaneous Tissue): Yes Fat Layer (Subcutaneous Tissue): Yes N/A Exposed Structures: Fascia: No Tendon:  No Muscle: No Joint: No Bone: No Small (1-33%) Small (1-33%) N/A Epithelialization: Debridement - Excisional Debridement - Excisional N/A Debridement: Pre-procedure Verification/Time Out 10:30 10:30 N/A Taken: Lidocaine  4% Topical Solution Lidocaine  4% Topical Solution N/A Pain Control: Subcutaneous, Slough Subcutaneous, Slough N/A Tissue Debrided: Skin/Subcutaneous Tissue Skin/Subcutaneous Tissue N/A Level: 2.42 0.59 N/A Debridement A (sq cm): rea Curette Curette N/A Instrument: Minimum Minimum N/A Bleeding: Pressure Pressure N/A Hemostasis A chieved: 0 0 N/A Procedural Pain: 0 0 N/A Post Procedural Pain: Procedure was tolerated well Procedure was tolerated well N/A Debridement Treatment Response: 2.8x1.1x0.1 1.5x0.5x0.1 N/A Post Debridement Measurements L x W x D (cm) 0.242 0.059 N/A Post Debridement Volume: (cm) Scarring: Yes Scarring: Yes N/A Periwound Skin Texture: Excoriation: No Excoriation: No Induration: No Induration: No Callus: No Callus: No Crepitus: No Crepitus: No Rash: No Rash: No Maceration: No Maceration: No N/A Periwound Skin Moisture: Dry/Scaly: No Dry/Scaly: No Hemosiderin Staining: Yes Hemosiderin Staining: Yes N/A Periwound Skin Color: Atrophie Blanche: No Atrophie Blanche: No Cyanosis: No Cyanosis: No Ecchymosis: No Ecchymosis: No Erythema: No Erythema: No Mottled: No Mottled: No Pallor: No Pallor: No Rubor: No Rubor: No No Abnormality No Abnormality N/A Temperature: Compression Therapy Compression Therapy N/A Procedures Performed: Debridement  Debridement Treatment Notes Electronic Signature(s) Signed: 04/04/2023 10:40:12 AM By: Marolyn Nest MD FACS Entered By: Marolyn Nest on 04/04/2023 10:40:12 [name] ([id number]) [id number] Page 5 of 9 -------------------------------------------------------------------------------- Multi-Disciplinary Care Plan Details Patient Name: Date  of Service: [name] 04/04/2023 10:00 A M Medical Record Number: [id number] Patient Account Number: [id number] Date of Birth/Sex: Treating RN: 01-Feb-1956 (68 y.o. [name] Primary Care Cassey Bacigalupo: [name] Other Clinician: Referring Nicey Krah: Treating Ayo Guarino/Extender: Marolyn Nest [name] [name] in Treatment: 10 Multidisciplinary Care Plan reviewed with physician Active Inactive Venous Leg Ulcer Nursing Diagnoses: Actual venous Insuffiency (use after diagnosis is confirmed) Knowledge deficit related to disease process and management Goals: Patient will maintain optimal edema control Date Initiated: 01/23/2023 Target Resolution Date: 05/30/2023 Goal Status: Active Patient/caregiver will verbalize understanding of disease process and disease management Date Initiated: 01/23/2023 Target Resolution Date: 05/30/2023 Goal Status: Active Interventions: Assess peripheral edema status every visit. Compression as ordered Provide education on venous insufficiency Treatment Activities: Therapeutic compression applied : 01/23/2023 Notes: Wound/Skin Impairment Nursing Diagnoses: Impaired tissue integrity Knowledge deficit related to ulceration/compromised skin integrity Goals: Patient/caregiver will verbalize understanding of skin care regimen Date Initiated: 01/23/2023 Target Resolution Date: 05/30/2023 Goal Status: Active Interventions: Assess ulceration(s) every visit Treatment Activities: Skin care regimen initiated : 01/23/2023 Topical wound management initiated : 01/23/2023 Notes: Electronic Signature(s) Signed: 04/04/2023 12:47:24 PM By: Merleen Handing RN, BSN Entered By: Merleen Handing on 04/04/2023 10:25:53 -------------------------------------------------------------------------------- Pain Assessment Details Patient Name: Date of Service: [name], [name] 04/04/2023 10:00 A M Medical Record Number: [id number] Patient Account Number:  [id number] Date of Birth/Sex: Treating RN: 1955-11-23 (68 y.o. M) Primary Care Kalsey Lull: [name] Other Clinician: GERLEAN [name] ([id number]) [id number] Page 6 of 9 Referring Venancio Chenier: Treating Jamile Sivils/Extender: Marolyn Nest [name] [name] in Treatment: 10 Active Problems Location of Pain Severity and Description of Pain Patient Has Paino No Site Locations Pain Management and Medication Current Pain Management: Electronic Signature(s) Signed: 04/09/2023 4:28:52 PM By: Hadassah Belt Entered By: Hadassah Belt on 04/04/2023 10:06:19 -------------------------------------------------------------------------------- Patient/Caregiver Education Details Patient Name: Date of Service: [name], [name] 1/3/2025andnbsp10:00 A M Medical Record Number: [id number] Patient Account Number: [id number] Date of Birth/Gender: Treating RN: 1955/06/06 (68 y.o. [name] Primary Care Physician: [name] Other Clinician: Referring Physician: Treating Physician/Extender: Marolyn Nest [name] [name] in Treatment: 10 Education Assessment Education Provided To: Patient Education Topics Provided Venous: Methods: Explain/Verbal Responses: Reinforcements needed, State content correctly Electronic Signature(s) Signed: 04/04/2023 12:47:24 PM By: Merleen Handing RN, BSN Entered By: Merleen Handing on 04/04/2023 10:26:32 [name] ([id number]) [id number] Page 7 of 9 -------------------------------------------------------------------------------- Wound Assessment Details Patient Name: Date of Service: [name] 04/04/2023 10:00 A M Medical Record Number: [id number] Patient Account Number: [id number] Date of Birth/Sex: Treating RN: 28-Jul-1955 (68 y.o. M) Primary Care Alois Mincer: [name] Other Clinician: Referring Edmon Magid: Treating Thaddaeus Granja/Extender: Marolyn Nest [name] [name] in  Treatment: 10 Wound Status Wound Number: 1 Primary Venous Leg Ulcer Etiology: Wound Location: Right, Lateral Lower Leg Wound Open Wounding Event: Gradually Appeared Status: Date Acquired: 10/31/2022 Comorbid Chronic Obstructive Pulmonary Disease (COPD), Angina, Weeks Of Treatment: 10 History: Arrhythmia, Coronary Artery Disease, Hypertension, Myocardial Clustered Wound: No Infarction, Peripheral Venous Disease Photos Wound Measurements Length: (cm) 2.8 Width: (cm) 1.1 Depth: (cm) 0.1 Area: (cm) 2.419 Volume: (cm) 0.242 % Reduction in Area: 74.3% % Reduction in Volume: 74.3% Epithelialization: Small (1-33%) Tunneling: No Undermining: No Wound Description Classification: Full Thickness Without Exposed Support Wound Margin: Distinct,  outline attached Exudate Amount: Medium Exudate Type: Serosanguineous Exudate Color: red, brown Structures Foul Odor After Cleansing: No Slough/Fibrino Yes Wound Bed Granulation Amount: Small (1-33%) Exposed Structure Granulation Quality: Red Fascia Exposed: No Necrotic Amount: Large (67-100%) Fat Layer (Subcutaneous Tissue) Exposed: Yes Necrotic Quality: Adherent Slough Tendon Exposed: No Muscle Exposed: No Joint Exposed: No Bone Exposed: No Periwound Skin Texture Texture Color No Abnormalities Noted: No No Abnormalities Noted: No Callus: No Atrophie Blanche: No Crepitus: No Cyanosis: No Excoriation: No Ecchymosis: No Induration: No Erythema: No Rash: No Hemosiderin Staining: Yes Scarring: Yes Mottled: No Pallor: No Moisture Rubor: No No Abnormalities Noted: Yes Temperature / Pain Temperature: No Abnormality [name], [name] ([id number]) [id number] Page 8 of 9 Electronic Signature(s) Signed: 04/14/2023 5:23:50 PM By: Wyn Iha Entered By: Wyn Iha on 04/04/2023 10:24:44 -------------------------------------------------------------------------------- Wound Assessment Details Patient  Name: Date of Service: [name], [name] 04/04/2023 10:00 A M Medical Record Number: [id number] Patient Account Number: [id number] Date of Birth/Sex: Treating RN: 1955/09/11 (68 y.o. M) Primary Care Crespin Forstrom: [name] Other Clinician: Referring Mandela Bello: Treating Sarahy Creedon/Extender: [name] [name] in Treatment: 10 Wound Status Wound Number: 2 Primary Venous Leg Ulcer Etiology: Wound Location: Left, Lateral Lower Leg Wound Open Wounding Event: Gradually Appeared Status: Date Acquired: 01/13/2023 Comorbid Chronic Obstructive Pulmonary Disease (COPD), Angina, Weeks Of Treatment: 10 History: Arrhythmia, Coronary Artery Disease, Hypertension, Myocardial Clustered Wound: No Infarction, Peripheral Venous Disease Photos Wound Measurements Length: (cm) 1.5 Width: (cm) 0.5 Depth: (cm) 0.1 Area: (cm) 0.589 Volume: (cm) 0.059 % Reduction in Area: 47.6% % Reduction in Volume: 47.3% Epithelialization: Small (1-33%) Tunneling: No Undermining: No Wound Description Classification: Full Thickness Without Exposed Support Wound Margin: Distinct, outline attached Exudate Amount: Medium Exudate Type: Serosanguineous Exudate Color: red, brown Structures Foul Odor After Cleansing: No Slough/Fibrino Yes Wound Bed Granulation Amount: Small (1-33%) Exposed Structure Granulation Quality: Red Fat Layer (Subcutaneous Tissue) Exposed: Yes Necrotic Amount: Large (67-100%) Necrotic Quality: Adherent Slough Periwound Skin Texture Texture Color No Abnormalities Noted: No No Abnormalities Noted: No Callus: No Atrophie Blanche: No Crepitus: No Cyanosis: No Excoriation: No Ecchymosis: No Induration: No Erythema: No Rash: No Hemosiderin Staining: Yes Scarring: Yes Mottled: No [name], [name] ([id number]) [id number] Page 9 of 9 Pallor: No Moisture Rubor: No No Abnormalities Noted: Yes Temperature / Pain Temperature: No  Abnormality Electronic Signature(s) Signed: 04/14/2023 5:23:50 PM By: Wyn Iha Entered By: Wyn Iha on 04/04/2023 10:25:12 -------------------------------------------------------------------------------- Vitals Details Patient Name: Date of Service: [name], [name] 04/04/2023 10:00 A M Medical Record Number: [id number] Patient Account Number: [id number] Date of Birth/Sex: Treating RN: 03/23/1956 (68 y.o. M) Primary Care Karilynn Carranza: [name] Other Clinician: Referring March Steyer: Treating Mckay Brandt/Extender: [name] [name] in Treatment: 10 Vital Signs Time Taken: 10:05 Temperature (F): 98.2 Height (in): 68 Pulse (bpm): 81 Weight (lbs): 208 Respiratory Rate (breaths/min): 20 Body Mass Index (BMI): 31.6 Blood Pressure (mmHg): 121/61 Reference Range: 80 - 120 mg / dl Electronic Signature(s) Signed: 04/09/2023 4:28:52 PM By: Hadassah Belt Entered By: Hadassah Belt on 04/04/2023 10:06:13

## 2023-04-18 ENCOUNTER — Encounter (HOSPITAL_BASED_OUTPATIENT_CLINIC_OR_DEPARTMENT_OTHER): Payer: Medicare Other | Admitting: General Surgery

## 2023-04-18 DIAGNOSIS — I87333 Chronic venous hypertension (idiopathic) with ulcer and inflammation of bilateral lower extremity: Secondary | ICD-10-CM | POA: Diagnosis not present

## 2023-04-18 NOTE — Progress Notes (Addendum)
KAMRYNN, LUTY ([id number]) [id number] Page 1 of 11 Visit Report for 04/18/2023 Chief Complaint Document Details Patient Name: Date of Service: [name] [name] 04/18/2023 8:15 A M Medical Record Number: [id number] Patient Account Number: [id number] Date of Birth/Sex: Treating RN: 10/17/1955 (67 y.o. M) Primary Care Provider: Lynnea Ferrier Other Clinician: Referring Provider: Treating Provider/Extender: [name] in Treatment: 12 Information Obtained from: Patient Chief Complaint Patient presents for treatment of open ulcers due to venous insufficiency Electronic Signature(s) Signed: 04/18/2023 8:41:06 AM By: Duanne Guess MD FACS Entered By: Duanne Guess on 04/18/2023 08:41:06 -------------------------------------------------------------------------------- Debridement Details Patient Name: Date of Service: [name], [name] 04/18/2023 8:15 A M Medical Record Number: [id number] Patient Account Number: [id number] Date of Birth/Sex: Treating RN: 10/09/1955 (68 y.o. [name] Primary Care Provider: Lynnea Ferrier Other Clinician: Referring Provider: Treating Provider/Extender: [name] in Treatment: 12 Debridement Performed for Assessment: Wound #1 Right,Lateral Lower Leg Performed By: Physician Duanne Guess, MD The following information was scribed by: Zenaida Deed The information was scribed for: Duanne Guess Debridement Type: Debridement Severity of Tissue Pre Debridement: Fat layer exposed Level of Consciousness (Pre-procedure): Awake and Alert Pre-procedure Verification/Time Out Yes - 08:50 Taken: Start Time: 08:50 Pain Control: Lidocaine 5% topical ointment Percent of Wound Bed Debrided: 100% T Area Debrided (cm): otal 1.44 Tissue and other material debrided: Viable, Non-Viable, Eschar, Slough, Subcutaneous, Skin: Epidermis, Slough Level: Skin/Subcutaneous  Tissue Debridement Description: Excisional Instrument: Curette Bleeding: Minimum Hemostasis Achieved: Pressure Procedural Pain: 0 Post Procedural Pain: 0 Response to Treatment: Procedure was tolerated well Level of Consciousness (Post- Awake and Alert procedure): Post Debridement Measurements of Total Wound Length: (cm) 2.3 Width: (cm) 0.8 Depth: (cm) 0.1 Volume: (cm) 0.145 [place], [place] ([id number]) [id number] Page 2 of 11 Character of Wound/Ulcer Post Debridement: Improved Severity of Tissue Post Debridement: Fat layer exposed Post Procedure Diagnosis Same as Pre-procedure Electronic Signature(s) Signed: 04/18/2023 9:32:11 AM By: Duanne Guess MD FACS Signed: 04/18/2023 1:37:16 PM By: Zenaida Deed RN, BSN Entered By: Zenaida Deed on 04/18/2023 08:51:05 -------------------------------------------------------------------------------- Debridement Details Patient Name: Date of Service: [name], [name] 04/18/2023 8:15 A M Medical Record Number: [id number] Patient Account Number: [id number] Date of Birth/Sex: Treating RN: 1955/10/30 (68 y.o. [name] Primary Care Provider: Lynnea Ferrier Other Clinician: Referring Provider: Treating Provider/Extender: [name] in Treatment: 12 Debridement Performed for Assessment: Wound #2 Left,Lateral Lower Leg Performed By: Physician Duanne Guess, MD The following information was scribed by: Zenaida Deed The information was scribed for: Duanne Guess Debridement Type: Debridement Severity of Tissue Pre Debridement: Fat layer exposed Level of Consciousness (Pre-procedure): Awake and Alert Pre-procedure Verification/Time Out Yes - 08:50 Taken: Start Time: 08:50 Pain Control: Lidocaine 5% topical ointment Percent of Wound Bed Debrided: 100% T Area Debrided (cm): otal 0.19 Tissue and other material debrided: Viable, Non-Viable, Slough, Subcutaneous,  Slough Level: Skin/Subcutaneous Tissue Debridement Description: Excisional Instrument: Curette Bleeding: Minimum Hemostasis Achieved: Pressure Procedural Pain: 0 Post Procedural Pain: 0 Response to Treatment: Procedure was tolerated well Level of Consciousness (Post- Awake and Alert procedure): Post Debridement Measurements of Total Wound Length: (cm) 0.8 Width: (cm) 0.3 Depth: (cm) 0.1 Volume: (cm) 0.019 Character of Wound/Ulcer Post Debridement: Improved Severity of Tissue Post Debridement: Fat layer exposed Post Procedure Diagnosis Same as Pre-procedure Electronic Signature(s) Signed: 04/18/2023 9:32:11 AM By: Duanne Guess MD FACS Signed: 04/18/2023 1:37:16 PM By: Zenaida Deed RN, BSN Entered By: Zenaida Deed on 04/18/2023 08:51:59 [name] ([id number]) [id number]  Page 3 of 11 -------------------------------------------------------------------------------- HPI Details Patient Name: Date of Service: [name] 04/18/2023 8:15 A M Medical Record Number: [id number] Patient Account Number: [id number] Date of Birth/Sex: Treating RN: [birth year] (68 y.o. M) Primary Care Provider: Lynnea Ferrier Other Clinician: Referring Provider: Treating Provider/Extender: [name] in Treatment: 12 History of Present Illness HPI Description: ADMISSION 01/23/2023 ***VASCULAR STUDIES:*** ABIs 11/2021: +-------+-----------+-----------+------------+------------+ ABI/TBIT oday's ABIT oday's TBIPrevious ABIPrevious TBI +-------+-----------+-----------+------------+------------+ Right 1.08 0.35 0.82 0.29  +-------+-----------+-----------+------------+------------+ Left 0.97 0.27 0.88 0.27  +-------+-----------+-----------+------------+------------+ Venous reflux 03/2021 Venous Reflux Times +--------------+---------+------+-----------+------------+--------+ RIGHT Reflux NoRefluxReflux  TimeDiameter cmsComments    Yes     +--------------+---------+------+-----------+------------+--------+ CFV   yes  >1 second    +--------------+---------+------+-----------+------------+--------+ FV prox   yes  >1 second    +--------------+---------+------+-----------+------------+--------+ FV mid   yes  >1 second    +--------------+---------+------+-----------+------------+--------+ FV dist   yes  >1 second    +--------------+---------+------+-----------+------------+--------+ Popliteal   yes  >1 second    +--------------+---------+------+-----------+------------+--------+ GSV at SFJ   yes  >500 ms  0.736   +--------------+---------+------+-----------+------------+--------+ GSV prox thigh  yes  >500 ms  0.591   +--------------+---------+------+-----------+------------+--------+ GSV mid thigh   yes  >500 ms  0.591   +--------------+---------+------+-----------+------------+--------+ GSV dist thigh  yes  >500 ms  0.546   +--------------+---------+------+-----------+------------+--------+ GSV at knee no    0.592   +--------------+---------+------+-----------+------------+--------+ GSV prox calf     0.441   +--------------+---------+------+-----------+------------+--------+ GSV mid calf     0.461   +--------------+---------+------+-----------+------------+--------+ SSV Pop Fossa no    0.406   +--------------+---------+------+-----------+------------+--------+ SSV prox calf   yes  >500 ms  0.379   +--------------+---------+------+-----------+------------+--------+ SSV mid calf     0.379   +--------------+---------+------+-----------+------------+--------+ Summary: Right: - No evidence of deep vein thrombosis seen in the right lower extremity, from the common femoral through the popliteal veins. - No evidence of superficial venous thrombosis in the right  lower extremity. - Venous reflux is noted in the right common femoral vein. - Venous reflux is noted in the right sapheno-femoral junction. - Venous reflux is noted in the right greater saphenous vein in the thigh. - Venous reflux is noted in the right femoral vein. - Venous reflux is noted in the right popliteal vein. - Venous reflux is noted in the right short saphenous vein. This is a 68 year old smoker with a significant history of cardiac and peripheral vascular disease. He has had issues with venous ulceration on his right leg that have resolved with local care in the past, but the most recent wound has been present for a couple of months and has not healed despite treatment with Silvadene cream and home application of Unna boots. He also has developed 2 small ulcers on the left that have presented within the past couple of weeks. Of note, his right greater saphenous vein was harvested in 2023 for femoral above-knee popliteal bypass. He was referred here by his primary care provider for further evaluation and management of his lower extremity venous ulcers. [name], [name] ([id number]) [id number] Page 4 of 11 01/31/2023: The left medial lower leg wound is healed. The left lateral and right lateral lower leg wounds are both smaller. They have slough accumulation on their surfaces. Edema control is excellent. 02/05/2023: Both wounds are just slightly smaller. They are cleaner, however, with a bit of slough accumulation on the surfaces. Edema control remains good. 02/14/2023: The wounds are smaller again today. They have slough on the surface. Edema control is good. 02/19/2023: The wounds measured  about the same size today, but there is more epithelialization occurring in the midportion of the wounds. There is slough accumulation once again. Edema control remains good. 02/25/2023: The left lateral leg wound measures slightly longer today. Both wounds have more slough  on them this week then on prior occasions. Edema control is good. No erythema, induration, or malodor to suggest infection. 03/18/2023: Both wounds measured smaller today. There is an enlarging bud of epithelialized tissue in the center of the right leg wound. There is slough on the surface of both. Edema control is good. 03/28/2023: The wound measurements did not change today, but on visual inspection, both wounds appear narrower. There is slough accumulation on both surfaces. Good edema control bilaterally. 04/04/2023: Both wounds measured smaller today, particularly the left lateral leg wound. There is slough accumulation present. Good edema control bilaterally. 04/11/2023: Both wounds continues to contract. The left lateral leg wound is substantially smaller and more superficial. Both have some slough buildup. Edema control continues to be excellent bilaterally. 04/18/2023: Both wounds are measuring smaller. The left lateral leg wound is nearly closed. The skin edges on the right are starting to roll inward a bit and there is some eschar along the edges. Electronic Signature(s) Signed: 04/18/2023 8:52:37 AM By: Duanne Guess MD FACS Entered By: Duanne Guess on 04/18/2023 08:52:37 -------------------------------------------------------------------------------- Physical Exam Details Patient Name: Date of Service: [name], [name] 04/18/2023 8:15 A M Medical Record Number: [id number] Patient Account Number: [id number] Date of Birth/Sex: Treating RN: [birth year] (68 y.o. M) Primary Care Provider: Lynnea Ferrier Other Clinician: Referring Provider: Treating Provider/Extender: [name] in Treatment: 12 Constitutional Slightly hypertensive. . . . no acute distress. Respiratory Normal work of breathing on room air.. Notes 04/18/2023: Both wounds are measuring smaller. The left lateral leg wound is nearly closed. The skin edges on the right are starting to roll  inward a bit and there is some eschar along the edges. Electronic Signature(s) Signed: 04/18/2023 8:54:48 AM By: Duanne Guess MD FACS Entered By: Duanne Guess on 04/18/2023 08:54:48 -------------------------------------------------------------------------------- Physician Orders Details Patient Name: Date of Service: [name], [name] 04/18/2023 8:15 A M Medical Record Number: [id number] Patient Account Number: [id number] Date of Birth/Sex: Treating RN: 15-Jan-1956 (68 y.o. [name] Primary Care Provider: Lynnea Ferrier Other Clinician: Referring Provider: Treating Provider/Extender: [name] in Treatment: [address], [place] ([id number]) [id number] Page 5 of 11 The following information was scribed by: Zenaida Deed The information was scribed for: Duanne Guess Verbal / Phone Orders: No Diagnosis Coding ICD-10 Coding Code Description [phone] Non-pressure chronic ulcer of other part of right lower leg with fat layer exposed L97.822 Non-pressure chronic ulcer of other part of left lower leg with fat layer exposed I87.333 Chronic venous hypertension (idiopathic) with ulcer and inflammation of bilateral lower extremity I73.9 Peripheral vascular disease, unspecified Follow-up Appointments ppointment in 1 week. - Dr. Lady Gary 1/24 @ 815 am Return A Anesthetic (In clinic) Topical Lidocaine 5% applied to wound bed (In clinic) Topical Lidocaine 4% applied to wound bed Bathing/ Shower/ Hygiene May shower with protection but do not get wound dressing(s) wet. Protect dressing(s) with water repellant cover (for example, large plastic bag) or a cast cover and may then take shower. Edema Control - Orders / Instructions Elevate legs to the level of the heart or above for 30 minutes daily and/or when sitting for 3-4 times a day throughout the day. Avoid standing for long periods of time. Exercise regularly Additional  Orders / Instructions Follow Nutritious Diet - vitamin C 500 mg start once a day and if tolerated then twice a day, zinc 30-50 mg once daily, protein shakes daily Wound Treatment Wound #1 - Lower Leg Wound Laterality: Right, Lateral Cleanser: Soap and Water 1 x Per Week/30 Days Discharge Instructions: in clinic Cleanser: Wound Cleanser 1 x Per Week/30 Days Peri-Wound Care: Sween Lotion (Moisturizing lotion) 1 x Per Week/30 Days Discharge Instructions: Apply moisturizing lotion as directed Topical: Gentamicin 1 x Per Week/30 Days Discharge Instructions: As directed by physician Topical: Mupirocin Ointment 1 x Per Week/30 Days Discharge Instructions: Apply Mupirocin (Bactroban) as instructed Topical: Skintegrity Hydrogel 4 (oz) 1 x Per Week/30 Days Discharge Instructions: Apply hydrogel as directed Prim Dressing: Promogran Prisma Matrix, 4.34 (sq in) (silver collagen) 1 x Per Week/30 Days ary Discharge Instructions: Moisten collagen with saline or hydrogel Secondary Dressing: Optifoam Non-Adhesive Dressing, 4x4 in 1 x Per Week/30 Days Discharge Instructions: Apply over primary dressing as directed. Compression Wrap: Urgo K2 Lite, (equivalent to a 3 layer) two layer compression system, regular 1 x Per Week/30 Days Discharge Instructions: Apply Urgo K2 Lite as directed (alternative to 3 layer compression). Wound #2 - Lower Leg Wound Laterality: Left, Lateral Cleanser: Soap and Water 1 x Per Week/30 Days Discharge Instructions: May shower and wash wound with dial antibacterial soap and water prior to dressing change. Cleanser: Wound Cleanser 1 x Per Week/30 Days Discharge Instructions: Cleanse the wound with wound cleanser prior to applying a clean dressing using gauze sponges, not tissue or cotton balls. Peri-Wound Care: Sween Lotion (Moisturizing lotion) 1 x Per Week/30 Days Discharge Instructions: Apply moisturizing lotion as directed Topical: Gentamicin 1 x Per Week/30 Days Discharge  Instructions: As directed by physician Topical: Mupirocin Ointment 1 x Per Week/30 Days Discharge Instructions: Apply Mupirocin (Bactroban) as instructed [name], [name] ([id number]) [id number] Page 6 of 11 Topical: Skintegrity Hydrogel 4 (oz) 1 x Per Week/30 Days Discharge Instructions: Apply hydrogel as directed Prim Dressing: Promogran Prisma Matrix, 4.34 (sq in) (silver collagen) 1 x Per Week/30 Days ary Discharge Instructions: Moisten collagen with saline or hydrogel Secondary Dressing: Optifoam Non-Adhesive Dressing, 4x4 in 1 x Per Week/30 Days Discharge Instructions: Apply over primary dressing as directed. Compression Wrap: Urgo K2 Lite, (equivalent to a 3 layer) two layer compression system, regular 1 x Per Week/30 Days Discharge Instructions: Apply Urgo K2 Lite as directed (alternative to 3 layer compression). Electronic Signature(s) Signed: 04/18/2023 9:32:11 AM By: Duanne Guess MD FACS Entered By: Duanne Guess on 04/18/2023 08:55:00 -------------------------------------------------------------------------------- Problem List Details Patient Name: Date of Service: [name], [name] 04/18/2023 8:15 A M Medical Record Number: [id number] Patient Account Number: [id number] Date of Birth/Sex: Treating RN: Mar 19, 1956 (68 y.o. [name] Primary Care Provider: Lynnea Ferrier Other Clinician: Referring Provider: Treating Provider/Extender: [name] in Treatment: 12 Active Problems ICD-10 Encounter Code Description Active Date MDM Diagnosis L97.812 Non-pressure chronic ulcer of other part of right lower leg with fat layer 01/23/2023 No Yes exposed L97.822 Non-pressure chronic ulcer of other part of left lower leg with fat layer 01/23/2023 No Yes exposed I87.333 Chronic venous hypertension (idiopathic) with ulcer and inflammation of 01/23/2023 No Yes bilateral lower extremity I73.9 Peripheral vascular  disease, unspecified 01/23/2023 No Yes Inactive Problems Resolved Problems Electronic Signature(s) Signed: 04/18/2023 8:40:48 AM By: Duanne Guess MD FACS Entered By: Duanne Guess on 04/18/2023 08:40:48 [name] ([id number]) [id number] Page 7 of 11 -------------------------------------------------------------------------------- Progress Note Details Patient Name: Date of Service: Gregory WSO  Caryl Pina [name] 04/18/2023 8:15 A M Medical Record Number: [id number] Patient Account Number: [id number] Date of Birth/Sex: Treating RN: 09-09-1955 (68 y.o. M) Primary Care Provider: Lynnea Ferrier Other Clinician: Referring Provider: Treating Provider/Extender: [name] in Treatment: 12 Subjective Chief Complaint Information obtained from Patient Patient presents for treatment of open ulcers due to venous insufficiency History of Present Illness (HPI) ADMISSION 01/23/2023 ***VASCULAR STUDIES:*** ABIs 11/2021: +-------+-----------+-----------+------------+------------+ ABI/TBIT oday's ABIT oday's TBIPrevious ABIPrevious TBI +-------+-----------+-----------+------------+------------+ Right 1.08 0.35 0.82 0.29  +-------+-----------+-----------+------------+------------+ Left 0.97 0.27 0.88 0.27  +-------+-----------+-----------+------------+------------+ Venous reflux 03/2021 Venous Reflux Times +--------------+---------+------+-----------+------------+--------+ RIGHT Reflux NoRefluxReflux TimeDiameter cmsComments    Yes     +--------------+---------+------+-----------+------------+--------+ CFV   yes  >1 second    +--------------+---------+------+-----------+------------+--------+ FV prox   yes  >1 second    +--------------+---------+------+-----------+------------+--------+ FV mid   yes  >1 second    +--------------+---------+------+-----------+------------+--------+ FV  dist   yes  >1 second    +--------------+---------+------+-----------+------------+--------+ Popliteal   yes  >1 second    +--------------+---------+------+-----------+------------+--------+ GSV at SFJ   yes  >500 ms  0.736   +--------------+---------+------+-----------+------------+--------+ GSV prox thigh  yes  >500 ms  0.591   +--------------+---------+------+-----------+------------+--------+ GSV mid thigh   yes  >500 ms  0.591   +--------------+---------+------+-----------+------------+--------+ GSV dist thigh  yes  >500 ms  0.546   +--------------+---------+------+-----------+------------+--------+ GSV at knee no    0.592   +--------------+---------+------+-----------+------------+--------+ GSV prox calf     0.441   +--------------+---------+------+-----------+------------+--------+ GSV mid calf     0.461   +--------------+---------+------+-----------+------------+--------+ SSV Pop Fossa no    0.406   +--------------+---------+------+-----------+------------+--------+ SSV prox calf   yes  >500 ms  0.379   +--------------+---------+------+-----------+------------+--------+ SSV mid calf     0.379   +--------------+---------+------+-----------+------------+--------+ Summary: Right: - No evidence of deep vein thrombosis seen in the right lower extremity, from the common femoral through the popliteal veins. - No evidence of superficial venous thrombosis in the right lower extremity. - Venous reflux is noted in the right common femoral vein. - Venous reflux is noted in the right sapheno-femoral junction. - Venous reflux is noted in the right greater saphenous vein in the thigh. - Venous reflux is noted in the right femoral vein. - Venous reflux is noted in the right popliteal vein. - Venous reflux is noted in the right short saphenous vein. [name], [name] ([id number])  [id number] Page 8 of 11 This is a 68 year old smoker with a significant history of cardiac and peripheral vascular disease. He has had issues with venous ulceration on his right leg that have resolved with local care in the past, but the most recent wound has been present for a couple of months and has not healed despite treatment with Silvadene cream and home application of Unna boots. He also has developed 2 small ulcers on the left that have presented within the past couple of weeks. Of note, his right greater saphenous vein was harvested in 2023 for femoral above-knee popliteal bypass. He was referred here by his primary care provider for further evaluation and management of his lower extremity venous ulcers. 01/31/2023: The left medial lower leg wound is healed. The left lateral and right lateral lower leg wounds are both smaller. They have slough accumulation on their surfaces. Edema control is excellent. 02/05/2023: Both wounds are just slightly smaller. They are cleaner, however, with a bit of slough accumulation on the surfaces. Edema control remains good. 02/14/2023: The wounds are smaller again today. They have slough on the surface. Edema control is good. 02/19/2023:  The wounds measured about the same size today, but there is more epithelialization occurring in the midportion of the wounds. There is slough accumulation once again. Edema control remains good. 02/25/2023: The left lateral leg wound measures slightly longer today. Both wounds have more slough on them this week then on prior occasions. Edema control is good. No erythema, induration, or malodor to suggest infection. 03/18/2023: Both wounds measured smaller today. There is an enlarging bud of epithelialized tissue in the center of the right leg wound. There is slough on the surface of both. Edema control is good. 03/28/2023: The wound measurements did not change today, but on visual inspection, both  wounds appear narrower. There is slough accumulation on both surfaces. Good edema control bilaterally. 04/04/2023: Both wounds measured smaller today, particularly the left lateral leg wound. There is slough accumulation present. Good edema control bilaterally. 04/11/2023: Both wounds continues to contract. The left lateral leg wound is substantially smaller and more superficial. Both have some slough buildup. Edema control continues to be excellent bilaterally. 04/18/2023: Both wounds are measuring smaller. The left lateral leg wound is nearly closed. The skin edges on the right are starting to roll inward a bit and there is some eschar along the edges. Objective Constitutional Slightly hypertensive. no acute distress. Vitals Time Taken: 8:30 AM, Height: 68 in, Weight: 208 lbs, BMI: 31.6, Temperature: 97.8 F, Pulse: 79 bpm, Respiratory Rate: 20 breaths/min, Blood Pressure: 144/81 mmHg. Respiratory Normal work of breathing on room air.. General Notes: 04/18/2023: Both wounds are measuring smaller. The left lateral leg wound is nearly closed. The skin edges on the right are starting to roll inward a bit and there is some eschar along the edges. Integumentary (Hair, Skin) Wound #1 status is Open. Original cause of wound was Gradually Appeared. The date acquired was: 10/31/2022. The wound has been in treatment 12 weeks. The wound is located on the Right,Lateral Lower Leg. The wound measures 2.3cm length x 0.8cm width x 0.1cm depth; 1.445cm^2 area and 0.145cm^3 volume. There is Fat Layer (Subcutaneous Tissue) exposed. There is no tunneling or undermining noted. There is a medium amount of serosanguineous drainage noted. The wound margin is flat and intact. There is large (67-100%) red granulation within the wound bed. There is a small (1-33%) amount of necrotic tissue within the wound bed including Adherent Slough. The periwound skin appearance had no abnormalities noted for moisture. The periwound skin  appearance exhibited: Hemosiderin Staining. The periwound skin appearance did not exhibit: Callus, Crepitus, Excoriation, Induration, Rash, Scarring, Atrophie Blanche, Cyanosis, Ecchymosis, Mottled, Pallor, Rubor, Erythema. Periwound temperature was noted as No Abnormality. Wound #2 status is Open. Original cause of wound was Gradually Appeared. The date acquired was: 01/13/2023. The wound has been in treatment 12 weeks. The wound is located on the Left,Lateral Lower Leg. The wound measures 0.8cm length x 0.3cm width x 0.1cm depth; 0.188cm^2 area and 0.019cm^3 volume. There is Fat Layer (Subcutaneous Tissue) exposed. There is no tunneling or undermining noted. There is a small amount of serosanguineous drainage noted. The wound margin is distinct with the outline attached to the wound base. There is large (67-100%) red granulation within the wound bed. There is a small (1-33%) amount of necrotic tissue within the wound bed including Adherent Slough. The periwound skin appearance had no abnormalities noted for moisture. The periwound skin appearance exhibited: Hemosiderin Staining. The periwound skin appearance did not exhibit: Callus, Crepitus, Excoriation, Induration, Rash, Scarring, Atrophie Blanche, Cyanosis, Ecchymosis, Mottled, Pallor, Rubor, Erythema. Periwound temperature was noted  as No Abnormality. Assessment Active Problems ICD-10 Non-pressure chronic ulcer of other part of right lower leg with fat layer exposed Non-pressure chronic ulcer of other part of left lower leg with fat layer exposed Chronic venous hypertension (idiopathic) with ulcer and inflammation of bilateral lower extremity Peripheral vascular disease, unspecified [name], [name] ([id number]) [phone].pdf Page 9 of 11 Procedures Wound #1 Pre-procedure diagnosis of Wound #1 is a Venous Leg Ulcer located on the Right,Lateral Lower Leg .Severity of Tissue Pre Debridement is: Fat layer exposed. There  was a Excisional Skin/Subcutaneous Tissue Debridement with a total area of 1.44 sq cm performed by Duanne Guess, MD. With the following instrument(s): Curette to remove Viable and Non-Viable tissue/material. Material removed includes Eschar, Subcutaneous Tissue, Slough, and Skin: Epidermis after achieving pain control using Lidocaine 5% topical ointment. No specimens were taken. A time out was conducted at 08:50, prior to the start of the procedure. A Minimum amount of bleeding was controlled with Pressure. The procedure was tolerated well with a pain level of 0 throughout and a pain level of 0 following the procedure. Post Debridement Measurements: 2.3cm length x 0.8cm width x 0.1cm depth; 0.145cm^3 volume. Character of Wound/Ulcer Post Debridement is improved. Severity of Tissue Post Debridement is: Fat layer exposed. Post procedure Diagnosis Wound #1: Same as Pre-Procedure Pre-procedure diagnosis of Wound #1 is a Venous Leg Ulcer located on the Right,Lateral Lower Leg . There was a Double Layer Compression Therapy Procedure by Zenaida Deed, RN. Post procedure Diagnosis Wound #1: Same as Pre-Procedure Notes: urgo lite. Wound #2 Pre-procedure diagnosis of Wound #2 is a Venous Leg Ulcer located on the Left,Lateral Lower Leg .Severity of Tissue Pre Debridement is: Fat layer exposed. There was a Excisional Skin/Subcutaneous Tissue Debridement with a total area of 0.19 sq cm performed by Duanne Guess, MD. With the following instrument(s): Curette to remove Viable and Non-Viable tissue/material. Material removed includes Subcutaneous Tissue and Slough and after achieving pain control using Lidocaine 5% topical ointment. No specimens were taken. A time out was conducted at 08:50, prior to the start of the procedure. A Minimum amount of bleeding was controlled with Pressure. The procedure was tolerated well with a pain level of 0 throughout and a pain level of 0 following the procedure. Post  Debridement Measurements: 0.8cm length x 0.3cm width x 0.1cm depth; 0.019cm^3 volume. Character of Wound/Ulcer Post Debridement is improved. Severity of Tissue Post Debridement is: Fat layer exposed. Post procedure Diagnosis Wound #2: Same as Pre-Procedure Pre-procedure diagnosis of Wound #2 is a Venous Leg Ulcer located on the Left,Lateral Lower Leg . There was a Double Layer Compression Therapy Procedure by Zenaida Deed, RN. Post procedure Diagnosis Wound #2: Same as Pre-Procedure Notes: urgo lite. Plan Follow-up Appointments: Return Appointment in 1 week. - Dr. Lady Gary 1/24 @ 815 am Anesthetic: (In clinic) Topical Lidocaine 5% applied to wound bed (In clinic) Topical Lidocaine 4% applied to wound bed Bathing/ Shower/ Hygiene: May shower with protection but do not get wound dressing(s) wet. Protect dressing(s) with water repellant cover (for example, large plastic bag) or a cast cover and may then take shower. Edema Control - Orders / Instructions: Elevate legs to the level of the heart or above for 30 minutes daily and/or when sitting for 3-4 times a day throughout the day. Avoid standing for long periods of time. Exercise regularly Additional Orders / Instructions: Follow Nutritious Diet - vitamin C 500 mg start once a day and if tolerated then twice a day, zinc 30-50 mg once daily,  protein shakes daily WOUND #1: - Lower Leg Wound Laterality: Right, Lateral Cleanser: Soap and Water 1 x Per Week/30 Days Discharge Instructions: in clinic Cleanser: Wound Cleanser 1 x Per Week/30 Days Peri-Wound Care: Sween Lotion (Moisturizing lotion) 1 x Per Week/30 Days Discharge Instructions: Apply moisturizing lotion as directed Topical: Gentamicin 1 x Per Week/30 Days Discharge Instructions: As directed by physician Topical: Mupirocin Ointment 1 x Per Week/30 Days Discharge Instructions: Apply Mupirocin (Bactroban) as instructed Topical: Skintegrity Hydrogel 4 (oz) 1 x Per Week/30  Days Discharge Instructions: Apply hydrogel as directed Prim Dressing: Promogran Prisma Matrix, 4.34 (sq in) (silver collagen) 1 x Per Week/30 Days ary Discharge Instructions: Moisten collagen with saline or hydrogel Secondary Dressing: Optifoam Non-Adhesive Dressing, 4x4 in 1 x Per Week/30 Days Discharge Instructions: Apply over primary dressing as directed. Com pression Wrap: Urgo K2 Lite, (equivalent to a 3 layer) two layer compression system, regular 1 x Per Week/30 Days Discharge Instructions: Apply Urgo K2 Lite as directed (alternative to 3 layer compression). WOUND #2: - Lower Leg Wound Laterality: Left, Lateral Cleanser: Soap and Water 1 x Per Week/30 Days Discharge Instructions: May shower and wash wound with dial antibacterial soap and water prior to dressing change. Cleanser: Wound Cleanser 1 x Per Week/30 Days Discharge Instructions: Cleanse the wound with wound cleanser prior to applying a clean dressing using gauze sponges, not tissue or cotton balls. Peri-Wound Care: Sween Lotion (Moisturizing lotion) 1 x Per Week/30 Days Discharge Instructions: Apply moisturizing lotion as directed Topical: Gentamicin 1 x Per Week/30 Days Discharge Instructions: As directed by physician Topical: Mupirocin Ointment 1 x Per Week/30 Days Discharge Instructions: Apply Mupirocin (Bactroban) as instructed Topical: Skintegrity Hydrogel 4 (oz) 1 x Per Week/30 Days Discharge Instructions: Apply hydrogel as directed Prim Dressing: Promogran Prisma Matrix, 4.34 (sq in) (silver collagen) 1 x Per Week/30 Days ary Discharge Instructions: Moisten collagen with saline or hydrogel Secondary Dressing: Optifoam Non-Adhesive Dressing, 4x4 in 1 x Per Week/30 Days [name], [name] ([id number]) [phone].pdf Page 10 of 11 Discharge Instructions: Apply over primary dressing as directed. Compression Wrap: Urgo K2 Lite, (equivalent to a 3 layer) two layer compression system, regular 1 x Per  Week/30 Days Discharge Instructions: Apply Urgo K2 Lite as directed (alternative to 3 layer compression). 04/18/2023: Both wounds are measuring smaller. The left lateral leg wound is nearly closed. The skin edges on the right are starting to roll inward a bit and there is some eschar along the edges. I used a curette to debride eschar, slough, skin, and subcutaneous tissue from the right leg wound and slough and subcutaneous tissue from the left leg wound. We will continue the mixture of topical gentamicin and mupirocin with Prisma silver collagen and hydrogel with Optifoam to maintain moisture balance and bilateral Urgo lite compression wraps. Follow-up in 1 week. Electronic Signature(s) Signed: 04/18/2023 8:55:46 AM By: Duanne Guess MD FACS Entered By: Duanne Guess on 04/18/2023 08:55:46 -------------------------------------------------------------------------------- SuperBill Details Patient Name: Date of Service: [name], [name] 04/18/2023 Medical Record Number: [id number] Patient Account Number: [id number] Date of Birth/Sex: Treating RN: 06-14-1955 (68 y.o. M) Primary Care Provider: Lynnea Ferrier Other Clinician: Referring Provider: Treating Provider/Extender: [name] in Treatment: 12 Diagnosis Coding ICD-10 Codes Code Description [phone] Non-pressure chronic ulcer of other part of right lower leg with fat layer exposed L97.822 Non-pressure chronic ulcer of other part of left lower leg with fat layer exposed I87.333 Chronic venous hypertension (idiopathic) with ulcer and inflammation of bilateral lower extremity I73.9 Peripheral  vascular disease, unspecified Facility Procedures : CPT4 Code: [id number] Description: 11042 - DEB SUBQ TISSUE 20 SQ CM/< ICD-10 Diagnosis Description L97.812 Non-pressure chronic ulcer of other part of right lower leg with fat layer exp L97.822 Non-pressure chronic ulcer of other part of left lower leg with fat layer  expo Modifier: osed sed Quantity: 1 Physician Procedures : CPT4 Code Description Modifier [id number] 99214 - WC PHYS LEVEL 4 - EST PT ICD-10 Diagnosis Description L97.812 Non-pressure chronic ulcer of other part of right lower leg with fat layer exposed L97.822 Non-pressure chronic ulcer of other part of left  lower leg with fat layer exposed I87.333 Chronic venous hypertension (idiopathic) with ulcer and inflammation of bilateral lower extremity I73.9 Peripheral vascular disease, unspecified Quantity: 1 : [id number] 11042 - WC PHYS SUBQ TISS 20 SQ CM ICD-10 Diagnosis Description L97.812 Non-pressure chronic ulcer of other part of right lower leg with fat layer exposed L97.822 Non-pressure chronic ulcer of other part of left lower leg with fat layer exposed Quantity: 1 Electronic Signature(s) Signed: 04/18/2023 8:56:02 AM By: Duanne Guess MD FACS Entered By: Duanne Guess on 04/18/2023 08:56:01 [name] ([id number]) [id number] Page 11 of 11

## 2023-04-18 NOTE — Progress Notes (Signed)
[name], [name] ([id number]) [id number] Page 1 of 9 Visit Report for 04/18/2023 Arrival Information Details Patient Name: Date of Service: [name] 04/18/2023 8:15 A M Medical Record Number: [id number] Patient Account Number: [id number] Date of Birth/Sex: Treating RN: 1955-12-20 (68 y.o. M) Primary Care Ritter Helsley: Lynnea Ferrier Other Clinician: Referring Jhordan Mckibben: Treating Deniese Oberry/Extender: [name] in Treatment: 12 Visit Information History Since Last Visit Added or deleted any medications: No Patient Arrived: Ambulatory Any new allergies or adverse reactions: No Arrival Time: 08:29 Had a fall or experienced change in No Accompanied By: self activities of daily living that may affect Transfer Assistance: None risk of falls: Patient Identification Verified: Yes Signs or symptoms of abuse/neglect since last visito No Secondary Verification Process Completed: Yes Hospitalized since last visit: No Patient Requires Transmission-Based Precautions: No Implantable device outside of the clinic excluding No Patient Has Alerts: No cellular tissue based products placed in the center since last visit: Has Dressing in Place as Prescribed: Yes Has Compression in Place as Prescribed: Yes Pain Present Now: No Electronic Signature(s) Signed: 04/18/2023 1:37:16 PM By: Zenaida Deed RN, BSN Entered By: Zenaida Deed on 04/18/2023 08:37:48 -------------------------------------------------------------------------------- Compression Therapy Details Patient Name: Date of Service: [name], [name] 04/18/2023 8:15 A M Medical Record Number: [id number] Patient Account Number: [id number] Date of Birth/Sex: Treating RN: 10/09/1955 (67 y.o. [name] Primary Care Aliene Tamura: Lynnea Ferrier Other Clinician: Referring Shane Badeaux: Treating Margarete Horace/Extender: [name] in Treatment: 12 Compression  Therapy Performed for Wound Assessment: Wound #1 Right,Lateral Lower Leg Performed By: Clinician Zenaida Deed, RN Compression Type: Double Layer Post Procedure Diagnosis Same as Pre-procedure Notes urgo lite Electronic Signature(s) Signed: 04/18/2023 1:37:16 PM By: Zenaida Deed RN, BSN Entered By: Zenaida Deed on 04/18/2023 08:52:39 [name] ([id number]) [id number] Page 2 of 9 -------------------------------------------------------------------------------- Compression Therapy Details Patient Name: Date of Service: [name] 04/18/2023 8:15 A M Medical Record Number: [id number] Patient Account Number: [id number] Date of Birth/Sex: Treating RN: 11/13/1955 (68 y.o. [name] Primary Care Aerielle Stoklosa: Lynnea Ferrier Other Clinician: Referring Sorin Frimpong: Treating Amita Atayde/Extender: [name] in Treatment: 12 Compression Therapy Performed for Wound Assessment: Wound #2 Left,Lateral Lower Leg Performed By: Clinician Zenaida Deed, RN Compression Type: Double Layer Post Procedure Diagnosis Same as Pre-procedure Notes urgo lite Electronic Signature(s) Signed: 04/18/2023 1:37:16 PM By: Zenaida Deed RN, BSN Entered By: Zenaida Deed on 04/18/2023 08:52:39 -------------------------------------------------------------------------------- Encounter Discharge Information Details Patient Name: Date of Service: [name], [name] 04/18/2023 8:15 A M Medical Record Number: [id number] Patient Account Number: [id number] Date of Birth/Sex: Treating RN: 04-16-1955 (68 y.o. [name] Primary Care Tavon Corriher: Lynnea Ferrier Other Clinician: Referring Veeda Virgo: Treating Winnie Barsky/Extender: [name] in Treatment: 12 Encounter Discharge Information Items Post Procedure Vitals Discharge Condition: Stable Temperature (F): 97.8 Ambulatory Status: Ambulatory Pulse (bpm):  79 Discharge Destination: Home Respiratory Rate (breaths/min): 18 Transportation: Private Auto Blood Pressure (mmHg): 144/81 Accompanied By: self Schedule Follow-up Appointment: Yes Clinical Summary of Care: Patient Declined Electronic Signature(s) Signed: 04/18/2023 1:37:16 PM By: Zenaida Deed RN, BSN Entered By: Zenaida Deed on 04/18/2023 12:53:46 -------------------------------------------------------------------------------- Lower Extremity Assessment Details Patient Name: Date of Service: [name], [name] 04/18/2023 8:15 A M Medical Record Number: [id number] Patient Account Number: [id number] Date of Birth/Sex: Treating RN: 1955/04/06 (68 y.o. [name] Primary Care Kaiya Boatman: Lynnea Ferrier Other Clinician: Referring Leomar Westberg: Treating Zephyr Ridley/Extender: Matthew Folks [place], Molly Maduro ([id number]) [id number] Page 3 of  9 Weeks in Treatment: 12 Edema Assessment Assessed: [Left: No] [Right: No] Edema: [Left: Yes] [Right: Yes] Calf Left: Right: Point of Measurement: From Medial Instep 37 cm 35 cm Ankle Left: Right: Point of Measurement: From Medial Instep 23 cm 24 cm Vascular Assessment Pulses: Dorsalis Pedis Palpable: [Left:Yes] [Right:Yes] Extremity colors, hair growth, and conditions: Extremity Color: [Left:Hyperpigmented] [Right:Hyperpigmented] Hair Growth on Extremity: [Left:No] [Right:No] Temperature of Extremity: [Left:Cool] [Right:Cool] Capillary Refill: [Left:< 3 seconds] [Right:< 3 seconds] Dependent Rubor: [Left:No Yes] [Right:No Yes] Electronic Signature(s) Signed: 04/18/2023 1:37:16 PM By: Zenaida Deed RN, BSN Entered By: Zenaida Deed on 04/18/2023 08:42:56 -------------------------------------------------------------------------------- Multi Wound Chart Details Patient Name: Date of Service: [name], [name] 04/18/2023 8:15 A M Medical Record Number: [id number] Patient Account Number:  [id number] Date of Birth/Sex: Treating RN: Jun 15, 1955 (68 y.o. M) Primary Care Temisha Murley: Lynnea Ferrier Other Clinician: Referring Navpreet Szczygiel: Treating Lavarius Doughten/Extender: [name] in Treatment: 12 Vital Signs Height(in): 68 Pulse(bpm): 79 Weight(lbs): 208 Blood Pressure(mmHg): 144/81 Body Mass Index(BMI): 31.6 Temperature(F): 97.8 Respiratory Rate(breaths/min): 20 [1:Photos:] [N/A:N/A] Right, Lateral Lower Leg Left, Lateral Lower Leg N/A Wound Location: Gradually Appeared Gradually Appeared N/A Wounding Event: Venous Leg Ulcer Venous Leg Ulcer N/A Primary Etiology: Chronic Obstructive Pulmonary Chronic Obstructive Pulmonary N/A Comorbid History: Disease (COPD), Angina, Arrhythmia, Disease (COPD), Angina, Arrhythmia, Coronary Artery Disease, Coronary Artery Disease, [name], [name] ([id number]) [phone].pdf Page 4 of 9 Hypertension, Myocardial Infarction, Hypertension, Myocardial Infarction, Peripheral Venous Disease Peripheral Venous Disease 10/31/2022 01/13/2023 N/A Date Acquired: 12 12 N/A Weeks of Treatment: Open Open N/A Wound Status: No No N/A Wound Recurrence: 2.3x0.8x0.1 0.8x0.3x0.1 N/A Measurements L x W x D (cm) 1.445 0.188 N/A A (cm) : rea 0.145 0.019 N/A Volume (cm) : 84.70% 83.30% N/A % Reduction in Area: 84.60% 83.00% N/A % Reduction in Volume: Full Thickness Without Exposed Full Thickness Without Exposed N/A Classification: Support Structures Support Structures Medium Medium N/A Exudate Amount: Serosanguineous Serosanguineous N/A Exudate Type: red, brown red, brown N/A Exudate Color: Distinct, outline attached Distinct, outline attached N/A Wound Margin: Medium (34-66%) Medium (34-66%) N/A Granulation Amount: Red Red N/A Granulation Quality: Medium (34-66%) Medium (34-66%) N/A Necrotic Amount: Fat Layer (Subcutaneous Tissue): Yes Fat Layer (Subcutaneous Tissue): Yes N/A Exposed  Structures: Fascia: No Tendon: No Muscle: No Joint: No Bone: No Small (1-33%) Small (1-33%) N/A Epithelialization: Scarring: Yes Scarring: Yes N/A Periwound Skin Texture: Excoriation: No Excoriation: No Induration: No Induration: No Callus: No Callus: No Crepitus: No Crepitus: No Rash: No Rash: No Maceration: No Maceration: No N/A Periwound Skin Moisture: Dry/Scaly: No Dry/Scaly: No Hemosiderin Staining: Yes Hemosiderin Staining: Yes N/A Periwound Skin Color: Atrophie Blanche: No Atrophie Blanche: No Cyanosis: No Cyanosis: No Ecchymosis: No Ecchymosis: No Erythema: No Erythema: No Mottled: No Mottled: No Pallor: No Pallor: No Rubor: No Rubor: No No Abnormality No Abnormality N/A Temperature: Treatment Notes Electronic Signature(s) Signed: 04/18/2023 8:41:01 AM By: Duanne Guess MD FACS Entered By: Duanne Guess on 04/18/2023 08:41:01 -------------------------------------------------------------------------------- Multi-Disciplinary Care Plan Details Patient Name: Date of Service: [name], [name] 04/18/2023 8:15 A M Medical Record Number: [id number] Patient Account Number: [id number] Date of Birth/Sex: Treating RN: [birth year] (68 y.o. [name] Primary Care Elyssia Strausser: Lynnea Ferrier Other Clinician: Referring Franciso Dierks: Treating Oliver Heitzenrater/Extender: [name] in Treatment: 12 Multidisciplinary Care Plan reviewed with physician Active Inactive Venous Leg Ulcer Nursing Diagnoses: Actual venous Insuffiency (use after diagnosis is confirmed) Knowledge deficit related to disease process and management GoalsNEPHTALI, HIN ([id number]) [phone].pdf Page 5 of 9  Patient will maintain optimal edema control Date Initiated: 01/23/2023 Target Resolution Date: 05/30/2023 Goal Status: Active Patient/caregiver will verbalize understanding of disease process and disease management Date Initiated:  01/23/2023 Target Resolution Date: 05/30/2023 Goal Status: Active Interventions: Assess peripheral edema status every visit. Compression as ordered Provide education on venous insufficiency Treatment Activities: Therapeutic compression applied : 01/23/2023 Notes: Wound/Skin Impairment Nursing Diagnoses: Impaired tissue integrity Knowledge deficit related to ulceration/compromised skin integrity Goals: Patient/caregiver will verbalize understanding of skin care regimen Date Initiated: 01/23/2023 Target Resolution Date: 05/30/2023 Goal Status: Active Interventions: Assess ulceration(s) every visit Treatment Activities: Skin care regimen initiated : 01/23/2023 Topical wound management initiated : 01/23/2023 Notes: Electronic Signature(s) Signed: 04/18/2023 1:37:16 PM By: Zenaida Deed RN, BSN Entered By: Zenaida Deed on 04/18/2023 08:37:01 -------------------------------------------------------------------------------- Pain Assessment Details Patient Name: Date of Service: [name], [name] 04/18/2023 8:15 A M Medical Record Number: [id number] Patient Account Number: [id number] Date of Birth/Sex: Treating RN: 02/19/1956 (68 y.o. M) Primary Care Madsen Riddle: Lynnea Ferrier Other Clinician: Referring Quantavius Humm: Treating Briya Lookabaugh/Extender: [name] in Treatment: 12 Active Problems Location of Pain Severity and Description of Pain Patient Has Paino No Site Locations Rate the pain. LATWAN, DANOWSKI ([id number]) [id number] Page 6 of 9 Rate the pain. Current Pain Level: 0 Pain Management and Medication Current Pain Management: Electronic Signature(s) Signed: 04/18/2023 1:37:16 PM By: Zenaida Deed RN, BSN Entered By: Zenaida Deed on 04/18/2023 08:38:10 -------------------------------------------------------------------------------- Patient/Caregiver Education Details Patient Name: Date of Service: [name], [name]  1/17/2025andnbsp8:15 A M Medical Record Number: [id number] Patient Account Number: [id number] Date of Birth/Gender: Treating RN: 1955-12-25 (68 y.o. [name] Primary Care Physician: Lynnea Ferrier Other Clinician: Referring Physician: Treating Physician/Extender: [name] in Treatment: 12 Education Assessment Education Provided To: Patient Education Topics Provided Venous: Methods: Explain/Verbal Responses: Reinforcements needed, State content correctly [employer]) Signed: 04/18/2023 1:37:16 PM By: Zenaida Deed RN, BSN Entered By: Zenaida Deed on 04/18/2023 08:37:25 -------------------------------------------------------------------------------- Wound Assessment Details Patient Name: Date of Service: [name], [name] 04/18/2023 8:15 A M Medical Record Number: [id number] Patient Account Number: [id number] Date of Birth/Sex: Treating RN: [birth year] (68 y.o. M) Primary Care David Rodriquez: Lynnea Ferrier Other Clinician: Referring Lonnie Reth: Treating Tiegan Jambor/Extender: Matthew Folks [place], Molly Maduro ([id number]) [id number] Page 7 of 9 Weeks in Treatment: 12 Wound Status Wound Number: 1 Primary Venous Leg Ulcer Etiology: Wound Location: Right, Lateral Lower Leg Wound Open Wounding Event: Gradually Appeared Status: Date Acquired: 10/31/2022 Comorbid Chronic Obstructive Pulmonary Disease (COPD), Angina, Weeks Of Treatment: 12 History: Arrhythmia, Coronary Artery Disease, Hypertension, Myocardial Clustered Wound: No Infarction, Peripheral Venous Disease Photos Wound Measurements Length: (cm) 2.3 Width: (cm) 0.8 Depth: (cm) 0.1 Area: (cm) 1.445 Volume: (cm) 0.145 % Reduction in Area: 84.7% % Reduction in Volume: 84.6% Epithelialization: Small (1-33%) Tunneling: No Undermining: No Wound Description Classification: Full Thickness Without Exposed Suppor Wound Margin: Flat and  Intact Exudate Amount: Medium Exudate Type: Serosanguineous Exudate Color: red, brown t Structures Foul Odor After Cleansing: No Slough/Fibrino Yes Wound Bed Granulation Amount: Large (67-100%) Exposed Structure Granulation Quality: Red Fascia Exposed: No Necrotic Amount: Small (1-33%) Fat Layer (Subcutaneous Tissue) Exposed: Yes Necrotic Quality: Adherent Slough Tendon Exposed: No Muscle Exposed: No Joint Exposed: No Bone Exposed: No Periwound Skin Texture Texture Color No Abnormalities Noted: No No Abnormalities Noted: No Callus: No Atrophie Blanche: No Crepitus: No Cyanosis: No Excoriation: No Ecchymosis: No Induration: No Erythema: No Rash: No Hemosiderin Staining: Yes Scarring: No Mottled: No Pallor: No Moisture Rubor:  No No Abnormalities Noted: Yes Temperature / Pain Temperature: No Abnormality Electronic Signature(s) Signed: 04/18/2023 1:37:16 PM By: Zenaida Deed RN, BSN Entered By: Zenaida Deed on 04/18/2023 08:44:13 [name] ([id number]) [id number] Page 8 of 9 -------------------------------------------------------------------------------- Wound Assessment Details Patient Name: Date of Service: [name] 04/18/2023 8:15 A M Medical Record Number: [id number] Patient Account Number: [id number] Date of Birth/Sex: Treating RN: 02/06/1956 (68 y.o. M) Primary Care Lorene Samaan: Lynnea Ferrier Other Clinician: Referring Caraline Deutschman: Treating Kamden Stanislaw/Extender: [name] in Treatment: 12 Wound Status Wound Number: 2 Primary Venous Leg Ulcer Etiology: Wound Location: Left, Lateral Lower Leg Wound Open Wounding Event: Gradually Appeared Status: Date Acquired: 01/13/2023 Comorbid Chronic Obstructive Pulmonary Disease (COPD), Angina, Weeks Of Treatment: 12 History: Arrhythmia, Coronary Artery Disease, Hypertension, Myocardial Clustered Wound: No Infarction, Peripheral Venous  Disease Photos Wound Measurements Length: (cm) 0.8 Width: (cm) 0.3 Depth: (cm) 0.1 Area: (cm) 0.188 Volume: (cm) 0.019 % Reduction in Area: 83.3% % Reduction in Volume: 83% Epithelialization: Medium (34-66%) Tunneling: No Undermining: No Wound Description Classification: Full Thickness Without Exposed Support Wound Margin: Distinct, outline attached Exudate Amount: Small Exudate Type: Serosanguineous Exudate Color: red, brown Structures Foul Odor After Cleansing: No Slough/Fibrino Yes Wound Bed Granulation Amount: Large (67-100%) Exposed Structure Granulation Quality: Red Fat Layer (Subcutaneous Tissue) Exposed: Yes Necrotic Amount: Small (1-33%) Necrotic Quality: Adherent Slough Periwound Skin Texture Texture Color No Abnormalities Noted: No No Abnormalities Noted: No Callus: No Atrophie Blanche: No Crepitus: No Cyanosis: No Excoriation: No Ecchymosis: No Induration: No Erythema: No Rash: No Hemosiderin Staining: Yes Scarring: No Mottled: No Pallor: No Moisture Rubor: No No Abnormalities Noted: Yes Temperature / Pain Temperature: No Abnormality Electronic Signature(s) Signed: 04/18/2023 1:37:16 PM By: Zenaida Deed RN, BSN Entered By: Zenaida Deed on 04/18/2023 08:44:46 [name] ([id number]) [id number] Page 9 of 9 -------------------------------------------------------------------------------- Vitals Details Patient Name: Date of Service: [name] 04/18/2023 8:15 A M Medical Record Number: [id number] Patient Account Number: [id number] Date of Birth/Sex: Treating RN: [birth year] (68 y.o. M) Primary Care Brenda Cowher: Lynnea Ferrier Other Clinician: Referring Skylyn Slezak: Treating Juli Odom/Extender: [name] in Treatment: 12 Vital Signs Time Taken: 08:30 Temperature (F): 97.8 Height (in): 68 Pulse (bpm): 79 Weight (lbs): 208 Respiratory Rate (breaths/min): 20 Body Mass Index (BMI):  31.6 Blood Pressure (mmHg): 144/81 Reference Range: 80 - 120 mg / dl Electronic Signature(s) Signed: 04/18/2023 1:37:16 PM By: Zenaida Deed RN, BSN Entered By: Zenaida Deed on 04/18/2023 08:37:59

## 2023-04-25 ENCOUNTER — Encounter (HOSPITAL_BASED_OUTPATIENT_CLINIC_OR_DEPARTMENT_OTHER): Payer: Medicare Other | Admitting: General Surgery

## 2023-04-25 DIAGNOSIS — I87333 Chronic venous hypertension (idiopathic) with ulcer and inflammation of bilateral lower extremity: Secondary | ICD-10-CM | POA: Diagnosis not present

## 2023-05-02 ENCOUNTER — Encounter (HOSPITAL_BASED_OUTPATIENT_CLINIC_OR_DEPARTMENT_OTHER): Payer: Medicare Other | Admitting: General Surgery

## 2023-05-02 DIAGNOSIS — I87333 Chronic venous hypertension (idiopathic) with ulcer and inflammation of bilateral lower extremity: Secondary | ICD-10-CM | POA: Diagnosis not present

## 2023-05-06 ENCOUNTER — Ambulatory Visit: Payer: Medicare Other | Admitting: Family Medicine

## 2023-05-06 ENCOUNTER — Ambulatory Visit (HOSPITAL_COMMUNITY)
Admission: RE | Admit: 2023-05-06 | Discharge: 2023-05-06 | Disposition: A | Discharge status: Home / Self Care | Payer: Medicare Other | Source: Ambulatory Visit | Attending: Family Medicine | Admitting: Family Medicine

## 2023-05-06 ENCOUNTER — Encounter: Payer: Self-pay | Admitting: Family Medicine

## 2023-05-06 VITALS — BP 120/62 | HR 74 | Temp 98.1°F | Ht 68.0 in | Wt 206.0 lb

## 2023-05-06 DIAGNOSIS — M5412 Radiculopathy, cervical region: Secondary | ICD-10-CM

## 2023-05-06 MED ORDER — PREDNISONE 20 MG PO TABS: ORAL_TABLET | ORAL | 0 refills | Status: AC

## 2023-05-06 NOTE — Progress Notes (Signed)
 Subjective:    Patient ID: [name], male    DOB: 29-Dec-1955, 68 y.o.   MRN: [id number]  Patient reports a 1 week history of pain radiating down his left arm.  The pain starts in his left trapezius radiates into his left posterior shoulder down his left tricep into his left forearm and all the way into his hand.  He describes it as a dull aching sensation.  He denies any weakness in his hands.  He has not lost any grip strength.  He denies any injury to his neck although the pain began after he was painting the ceiling in his home constantly looking up for over a week.  He denies any chest pain or shortness of breath or angina Past Medical History:  Diagnosis Date   Anginal pain (HCC)    Cancer (HCC)    skin   COPD (chronic obstructive pulmonary disease) (HCC)    Coronary artery disease    Dyspnea    Dysrhythmia    afib, s/p LAA ligation/MAZE 2013; recurrent aflutter 04/2021   History of hiatal hernia    medication related in 20s   Hx of artificial heart valve replacement    Hypertension    Mitral insufficiency and aortic stenosis    mechanical AVR/MVR 2013 in [place]   Myocardial infarction (HCC)    Pneumonia    as a child   PVD (peripheral vascular disease) (HCC)    Rheumatic heart disease    Stroke [facility])    Past Surgical History:  Procedure Laterality Date   ABDOMINAL AORTOGRAM W/LOWER EXTREMITY Bilateral 04/16/2021   Procedure: ABDOMINAL AORTOGRAM W/LOWER EXTREMITY;  Surgeon: Sheree Penne Bruckner, MD;  Location: [facility] INVASIVE CV LAB;  Service: Cardiovascular;  Laterality: Bilateral;   AORTIC AND MITRAL VALVE REPLACEMENT  2013   mechanical AVR/MVR in [place]   APPENDECTOMY     APPLICATION OF WOUND VAC Right 09/15/2021   Procedure: APPLICATION OF WOUND VAC RIGTH LEG;  Surgeon: Magda Debby SAILOR, MD;  Location: [facility] OR;  Service: Vascular;  Laterality: Right;   APPLICATION OF WOUND VAC  10/05/2021   Procedure: APPLICATION OF RIGHT LEG WOUND VAC;  Surgeon: Sheree Penne Bruckner,  MD;  Location[facility] OR;  Service: Vascular;;   BIOPSY  04/20/2020   Procedure: BIOPSY;  Surgeon: Saintclair Jasper, MD;  Location: [facility] ENDOSCOPY;  Service: Gastroenterology;;   CARDIAC VALVE REPLACEMENT     COLONOSCOPY WITH PROPOFOL  N/A 04/20/2020   Procedure: COLONOSCOPY WITH PROPOFOL ;  Surgeon: Saintclair Jasper, MD;  Location: [facility] ENDOSCOPY;  Service: Gastroenterology;  Laterality: N/A;   CORONARY ANGIOPLASTY WITH STENT PLACEMENT  10/2017   FEMORAL ARTERY EXPLORATION Right 09/15/2021   Procedure: FEMORAL ARTERY RE EXPLORATION;  Surgeon: Magda Debby SAILOR, MD;  Location: [facility] OR;  Service: Vascular;  Laterality: Right;   FEMORAL-POPLITEAL BYPASS GRAFT Right 09/14/2021   Procedure: RIGHT COMMON FEMORAL-ABOVE KNEE POPLITEAL ARTERY BYPASS WITH VEIN;  Surgeon: Sheree Penne Bruckner, MD;  Location: [facility] OR;  Service: Vascular;  Laterality: Right;   INCISION AND DRAINAGE OF WOUND Right 10/05/2021   Procedure: RIGHT QUILLIAN BUSH;  Surgeon: Sheree Penne Bruckner, MD;  Location: [facility] OR;  Service: Vascular;  Laterality: Right;   LAPAROSCOPIC APPENDECTOMY N/A 05/17/2019   Procedure: INTERVAL LAPAROSCOPIC APPENDECTOMY;  Surgeon: Tanda Locus, MD;  Location: [facility] ORS;  Service: General;  Laterality: N/A;   LOWER EXTREMITY ANGIOGRAM Right 09/15/2021   Procedure: RIGHT LOWER EXTREMITY ANGIOGRAM;  Surgeon: Magda Debby SAILOR, MD;  Location: [facility] OR;  Service: Vascular;  Laterality:  Right;   MAZE  2013   atrial appendage ligation and maze procedure in 2013   POLYPECTOMY  04/20/2020   Procedure: POLYPECTOMY;  Surgeon: Saintclair Jasper, MD;  Location: [facility] ENDOSCOPY;  Service: Gastroenterology;;   Current Outpatient Medications on File Prior to Visit  Medication Sig Dispense Refill   aspirin  EC 81 MG tablet Take 81 mg by mouth daily. Swallow whole.     atorvastatin  (LIPITOR ) 80 MG tablet Take 80 mg by mouth every evening.      carvedilol  (COREG ) 6.25 MG tablet Take 6.25 mg by mouth 2 (two) times daily.     cephALEXin  (KEFLEX ) 500 MG  capsule Take 1 capsule (500 mg total) by mouth 2 (two) times daily. (Patient not taking: Reported on 01/02/2023) 20 capsule 0   enalapril  (VASOTEC ) 5 MG tablet Take 5 mg by mouth daily.     furosemide  (LASIX ) 40 MG tablet TAKE 1 TABLET(40 MG) BY MOUTH DAILY 30 tablet 3   silver  sulfADIAZINE  (SILVADENE ) 1 % cream Apply 1 Application topically daily. 50 g 0   warfarin (COUMADIN ) 1 MG tablet Take 0.5-1 mg by mouth See admin instructions. Take 1 mg with 6 mg to equal 7 mg every Mon, Wed, and Fri  Take 0.5 mg with the 6 mg to equal 6.5 mg every Sun, Tues, Thurs, and Sat     warfarin (COUMADIN ) 6 MG tablet Take 6 mg by mouth daily.     No current facility-administered medications on file prior to visit.   Not on File Social History   Socioeconomic History   Marital status: Married    Spouse name: Not on file   Number of children: Not on file   Years of education: Not on file   Highest education level: Not on file  Occupational History   Not on file  Tobacco Use   Smoking status: Former    Types: Cigarettes    Passive exposure: Never   Smokeless tobacco: Never  Vaping Use   Vaping status: Never Used  Substance and Sexual Activity   Alcohol use: Yes    Comment: ocassional   Drug use: Never   Sexual activity: Not on file  Other Topics Concern   Not on file  Social History Narrative   Not on file   Social Drivers of Health   Financial Resource Strain: Not on file  Food Insecurity: Not on file  Transportation Needs: Not on file  Physical Activity: Not on file  Stress: Not on file  Social Connections: Not on file  Intimate Partner Violence: Not on file    Review of Systems  Musculoskeletal:  Positive for back pain.  All other systems reviewed and are negative.      Objective:   Physical Exam Constitutional:      General: He is not in acute distress.    Appearance: Normal appearance. He is normal weight. He is not ill-appearing or toxic-appearing.  Cardiovascular:      Rate and Rhythm: Normal rate and regular rhythm.     Heart sounds: Murmur heard.  Pulmonary:     Effort: Pulmonary effort is normal. No respiratory distress.     Breath sounds: Normal breath sounds. No stridor. No wheezing, rhonchi or rales.  Musculoskeletal:     Left elbow: No swelling. Normal range of motion. No tenderness.       Arms:  Neurological:     Mental Status: He is alert.       Assessment & Plan:  Cervical radiculopathy -  Plan: DG Cervical Spine Complete  Symptoms suggest cervical radiculopathy.  Begin a prednisone  taper pack given that the patient is on Coumadin  and is unable to take NSAIDs.  Obtain an x-ray of the neck to evaluate further for potential causes of nerve root impingement.

## 2023-05-09 ENCOUNTER — Encounter (HOSPITAL_BASED_OUTPATIENT_CLINIC_OR_DEPARTMENT_OTHER): Payer: Medicare Other | Attending: General Surgery | Admitting: General Surgery

## 2023-05-09 DIAGNOSIS — I87333 Chronic venous hypertension (idiopathic) with ulcer and inflammation of bilateral lower extremity: Secondary | ICD-10-CM | POA: Diagnosis not present

## 2023-05-09 DIAGNOSIS — I739 Peripheral vascular disease, unspecified: Secondary | ICD-10-CM | POA: Diagnosis not present

## 2023-05-09 DIAGNOSIS — L97812 Non-pressure chronic ulcer of other part of right lower leg with fat layer exposed: Secondary | ICD-10-CM | POA: Insufficient documentation

## 2023-05-15 ENCOUNTER — Encounter (HOSPITAL_BASED_OUTPATIENT_CLINIC_OR_DEPARTMENT_OTHER): Payer: Medicare Other | Admitting: General Surgery

## 2023-05-15 DIAGNOSIS — L97812 Non-pressure chronic ulcer of other part of right lower leg with fat layer exposed: Secondary | ICD-10-CM | POA: Diagnosis not present

## 2023-05-23 ENCOUNTER — Encounter (HOSPITAL_BASED_OUTPATIENT_CLINIC_OR_DEPARTMENT_OTHER): Payer: Medicare Other | Admitting: General Surgery

## 2023-05-23 DIAGNOSIS — L97812 Non-pressure chronic ulcer of other part of right lower leg with fat layer exposed: Secondary | ICD-10-CM | POA: Diagnosis not present

## 2023-05-30 ENCOUNTER — Encounter (HOSPITAL_BASED_OUTPATIENT_CLINIC_OR_DEPARTMENT_OTHER): Payer: Medicare Other | Admitting: General Surgery

## 2023-05-30 DIAGNOSIS — L97812 Non-pressure chronic ulcer of other part of right lower leg with fat layer exposed: Secondary | ICD-10-CM | POA: Diagnosis not present

## 2023-06-06 ENCOUNTER — Encounter (HOSPITAL_BASED_OUTPATIENT_CLINIC_OR_DEPARTMENT_OTHER): Payer: Medicare Other | Attending: General Surgery | Admitting: General Surgery

## 2023-06-06 DIAGNOSIS — I87333 Chronic venous hypertension (idiopathic) with ulcer and inflammation of bilateral lower extremity: Secondary | ICD-10-CM | POA: Insufficient documentation

## 2023-06-06 DIAGNOSIS — I739 Peripheral vascular disease, unspecified: Secondary | ICD-10-CM | POA: Insufficient documentation

## 2023-06-06 DIAGNOSIS — L97812 Non-pressure chronic ulcer of other part of right lower leg with fat layer exposed: Secondary | ICD-10-CM | POA: Insufficient documentation

## 2023-06-13 ENCOUNTER — Encounter (HOSPITAL_BASED_OUTPATIENT_CLINIC_OR_DEPARTMENT_OTHER): Payer: Medicare Other | Admitting: Internal Medicine

## 2023-06-13 DIAGNOSIS — L97812 Non-pressure chronic ulcer of other part of right lower leg with fat layer exposed: Secondary | ICD-10-CM | POA: Diagnosis not present

## 2023-06-20 ENCOUNTER — Encounter (HOSPITAL_BASED_OUTPATIENT_CLINIC_OR_DEPARTMENT_OTHER): Admitting: General Surgery

## 2023-06-20 DIAGNOSIS — L97812 Non-pressure chronic ulcer of other part of right lower leg with fat layer exposed: Secondary | ICD-10-CM | POA: Diagnosis not present

## 2023-06-27 ENCOUNTER — Encounter (HOSPITAL_BASED_OUTPATIENT_CLINIC_OR_DEPARTMENT_OTHER): Admitting: General Surgery

## 2023-06-27 DIAGNOSIS — L97812 Non-pressure chronic ulcer of other part of right lower leg with fat layer exposed: Secondary | ICD-10-CM | POA: Diagnosis not present

## 2023-10-09 ENCOUNTER — Ambulatory Visit

## 2023-10-13 ENCOUNTER — Ambulatory Visit: Admitting: Family Medicine

## 2023-11-04 ENCOUNTER — Encounter: Payer: Self-pay | Admitting: Family Medicine

## 2023-11-04 ENCOUNTER — Ambulatory Visit (INDEPENDENT_AMBULATORY_CARE_PROVIDER_SITE_OTHER): Admitting: Family Medicine

## 2023-11-04 VITALS — BP 124/58 | HR 62 | Temp 97.6°F | Ht 68.0 in | Wt 203.4 lb

## 2023-11-04 DIAGNOSIS — I739 Peripheral vascular disease, unspecified: Secondary | ICD-10-CM

## 2023-11-04 DIAGNOSIS — E7800 Pure hypercholesterolemia, unspecified: Secondary | ICD-10-CM

## 2023-11-04 DIAGNOSIS — Z7901 Long term (current) use of anticoagulants: Secondary | ICD-10-CM | POA: Insufficient documentation

## 2023-11-04 DIAGNOSIS — Z952 Presence of prosthetic heart valve: Secondary | ICD-10-CM

## 2023-11-04 DIAGNOSIS — Z20820 Contact with and (suspected) exposure to varicella: Secondary | ICD-10-CM

## 2023-11-04 DIAGNOSIS — I480 Paroxysmal atrial fibrillation: Secondary | ICD-10-CM | POA: Diagnosis not present

## 2023-11-04 DIAGNOSIS — Z125 Encounter for screening for malignant neoplasm of prostate: Secondary | ICD-10-CM

## 2023-11-04 DIAGNOSIS — Z122 Encounter for screening for malignant neoplasm of respiratory organs: Secondary | ICD-10-CM

## 2023-11-04 DIAGNOSIS — J441 Chronic obstructive pulmonary disease with (acute) exacerbation: Secondary | ICD-10-CM

## 2023-11-04 MED ORDER — PREDNISONE 20 MG PO TABS: ORAL_TABLET | ORAL | 0 refills | Status: AC

## 2023-11-04 MED ORDER — ALBUTEROL SULFATE HFA 108 (90 BASE) MCG/ACT IN AERS
2.0000 | INHALATION_SPRAY | Freq: Four times a day (QID) | RESPIRATORY_TRACT | 0 refills | Status: AC | PRN
Start: 2023-11-04 — End: ?

## 2023-11-04 NOTE — Progress Notes (Signed)
 Subjective:    Patient ID: [name], male    DOB: 12/11/1955, 68 y.o.   MRN: [id number]  HPI Patient is a very pleasant 68 year old Caucasian male here today for a checkup.  He has a history of coronary artery disease, peripheral vascular disease, rheumatic heart disease with mitral valve repair with mechanical valve.  He is on lifelong Coumadin  due to the valve repair.  He takes aspirin  due to his history of coronary artery disease.  He also states that he had a stroke in the past.  In 2023, the patient underwent a right femoropopliteal bypass.  Surgery was complicated by postoperative infection in the wound.  He has a history of venous insufficiency with venous stasis ulcers.  He has chronic venous stasis changes in both extremities distal to the knee with significant dermatofibrosis and hyperpigmentation.  His skin is essentially purple from the knee to the ankle.  Patient has occasional venous stasis ulcers that he treats independently with compression therapy at home.  Patient had arterial Dopplers of the legs in 2024.  Patient had a normal ABIs between 0.9 and 1.  TBI's were 0.2 and 0.3.  I explained to the patient that he has significant microvascular ischemia in his feet.  Unfortunately he continues to smoke.  Patient is due for shingles shot, he is due for a tetanus, he is due for a flu shot, and a COVID shot.  He is due for lung cancer screening.  His colonoscopy is up-to-date  Past Medical History:  Diagnosis Date   Anginal pain (HCC)    Cancer (HCC)    skin   COPD (chronic obstructive pulmonary disease) (HCC)    Coronary artery disease    Dyspnea    Dysrhythmia    afib, s/p LAA ligation/MAZE 2013; recurrent aflutter 04/2021   History of hiatal hernia    medication related in 20s   Hx of artificial heart valve replacement    Hypertension    Mitral insufficiency and aortic stenosis    mechanical AVR/MVR 2013 in [place]   Myocardial infarction (HCC)    Pneumonia    as a child    PVD (peripheral vascular disease) (HCC)    Rheumatic heart disease    Stroke [facility])    Past Surgical History:  Procedure Laterality Date   ABDOMINAL AORTOGRAM W/LOWER EXTREMITY Bilateral 04/16/2021   Procedure: ABDOMINAL AORTOGRAM W/LOWER EXTREMITY;  Surgeon: Sheree Penne Bruckner, MD;  Location: [facility] INVASIVE CV LAB;  Service: Cardiovascular;  Laterality: Bilateral;   AORTIC AND MITRAL VALVE REPLACEMENT  2013   mechanical AVR/MVR in [place]   APPENDECTOMY     APPLICATION OF WOUND VAC Right 09/15/2021   Procedure: APPLICATION OF WOUND VAC RIGTH LEG;  Surgeon: Magda Debby SAILOR, MD;  Location: [facility] OR;  Service: Vascular;  Laterality: Right;   APPLICATION OF WOUND VAC  10/05/2021   Procedure: APPLICATION OF RIGHT LEG WOUND VAC;  Surgeon: Sheree Penne Bruckner, MD;  Location: [facility] OR;  Service: Vascular;;   BIOPSY  04/20/2020   Procedure: BIOPSY;  Surgeon: Saintclair Jasper, MD;  Location: [facility] ENDOSCOPY;  Service: Gastroenterology;;   CARDIAC VALVE REPLACEMENT     COLONOSCOPY WITH PROPOFOL  N/A 04/20/2020   Procedure: COLONOSCOPY WITH PROPOFOL ;  Surgeon: Saintclair Jasper, MD;  Location: [facility];  Service: Gastroenterology;  Laterality: N/A;   CORONARY ANGIOPLASTY WITH STENT PLACEMENT  10/2017   FEMORAL ARTERY EXPLORATION Right 09/15/2021   Procedure: FEMORAL ARTERY RE EXPLORATION;  Surgeon: Magda Debby SAILOR, MD;  Location: [facility] OR;  Service: Vascular;  Laterality: Right;   FEMORAL-POPLITEAL BYPASS GRAFT Right 09/14/2021   Procedure: RIGHT COMMON FEMORAL-ABOVE KNEE POPLITEAL ARTERY BYPASS WITH VEIN;  Surgeon: Sheree Penne Bruckner, MD;  Location: [facility] OR;  Service: Vascular;  Laterality: Right;   INCISION AND DRAINAGE OF WOUND Right 10/05/2021   Procedure: RIGHT QUILLIAN BUSH;  Surgeon: Sheree Penne Bruckner, MD;  Location: [facility] OR;  Service: Vascular;  Laterality: Right;   LAPAROSCOPIC APPENDECTOMY N/A 05/17/2019   Procedure: INTERVAL LAPAROSCOPIC APPENDECTOMY;  Surgeon: Tanda Locus, MD;  Location[facility] ORS;   Service: General;  Laterality: N/A;   LOWER EXTREMITY ANGIOGRAM Right 09/15/2021   Procedure: RIGHT LOWER EXTREMITY ANGIOGRAM;  Surgeon: Magda Debby SAILOR, MD;  Location: [facility] OR;  Service: Vascular;  Laterality: Right;   MAZE  2013   atrial appendage ligation and maze procedure in 2013   POLYPECTOMY  04/20/2020   Procedure: POLYPECTOMY;  Surgeon: Saintclair Jasper, MD;  [facility]: [facility];  Service: Gastroenterology;;   Current Outpatient Medications on File Prior to Visit  Medication Sig Dispense Refill   aspirin  EC 81 MG tablet Take 81 mg by mouth daily. Swallow whole.     atorvastatin  (LIPITOR ) 80 MG tablet Take 80 mg by mouth every evening.      carvedilol  (COREG ) 6.25 MG tablet Take 6.25 mg by mouth 2 (two) times daily.     enalapril  (VASOTEC ) 5 MG tablet Take 5 mg by mouth daily.     furosemide  (LASIX ) 40 MG tablet TAKE 1 TABLET(40 MG) BY MOUTH DAILY 30 tablet 3   predniSONE  (DELTASONE ) 20 MG tablet 3 tabs poqday 1-2, 2 tabs poqday 3-4, 1 tab poqday 5-6 12 tablet 0   silver  sulfADIAZINE  (SILVADENE ) 1 % cream Apply 1 Application topically daily. 50 g 0   warfarin (COUMADIN ) 1 MG tablet Take 0.5-1 mg by mouth See admin instructions. Take 1 mg with 6 mg to equal 7 mg every Mon, Wed, and Fri  Take 0.5 mg with the 6 mg to equal 6.5 mg every Sun, Tues, Thurs, and Sat (Patient taking differently: Take 5 mg by mouth See admin instructions. Taking 5 mg on Tues, Thurs, Sat and Sun)     warfarin (COUMADIN ) 6 MG tablet Take 6 mg by mouth daily. (Patient taking differently: Take 5.5 mg by mouth daily. Taking 5.5 mg on M, W and F.)     cephALEXin  (KEFLEX ) 500 MG capsule Take 1 capsule (500 mg total) by mouth 2 (two) times daily. (Patient not taking: Reported on 11/04/2023) 20 capsule 0   No current facility-administered medications on file prior to visit.   Not on File Social History   Socioeconomic History   Marital status: Married    Spouse name: Not on file   Number of children: Not on file    Years of education: Not on file   Highest education level: Not on file  Occupational History   Not on file  Tobacco Use   Smoking status: Former    Types: Cigarettes    Passive exposure: Never   Smokeless tobacco: Never  Vaping Use   Vaping status: Never Used  Substance and Sexual Activity   Alcohol use: Yes    Comment: ocassional   Drug use: Never   Sexual activity: Not on file  Other Topics Concern   Not on file  Social History Narrative   Not on file   Social Drivers of Health   Financial Resource Strain: Not on file  Food Insecurity: Not on file  Transportation  Needs: Not on file  Physical Activity: Not on file  Stress: Not on file  Social Connections: Not on file  Intimate Partner Violence: Not on file    Review of Systems  All other systems reviewed and are negative.      Objective:   Physical Exam Constitutional:      General: He is not in acute distress.    Appearance: Normal appearance. He is normal weight. He is not ill-appearing or toxic-appearing.  Cardiovascular:     Rate and Rhythm: Normal rate and regular rhythm.     Heart sounds: Murmur heard.  Pulmonary:     Effort: Pulmonary effort is normal. No respiratory distress.     Breath sounds: No stridor. Examination of the right-upper field reveals wheezing. Examination of the left-upper field reveals wheezing. Examination of the right-middle field reveals wheezing. Examination of the left-middle field reveals wheezing. Examination of the right-lower field reveals wheezing. Examination of the left-lower field reveals wheezing. Wheezing present. No rhonchi or rales.  Abdominal:     General: Abdomen is flat. Bowel sounds are normal. There is no distension.     Palpations: Abdomen is soft.     Tenderness: There is no abdominal tenderness. There is no guarding.  Musculoskeletal:     Right lower leg: Pitting Edema present.     Left lower leg: Pitting Edema present.  Skin:    Findings: Rash present.       Neurological:     Mental Status: He is alert.         Assessment & Plan:  Prostate cancer screening - Plan: PSA  PVD (peripheral vascular disease) (HCC) - Plan: CBC with Differential/Platelet, Comprehensive metabolic panel with GFR, Lipid panel  Screening for lung cancer - Plan: CT CHEST LUNG CA SCREEN LOW DOSE W/O CM  Varicella exposure - Plan: Varicella zoster antibody, IgG  H/O mitral valve replacement with mechanical valve  Paroxysmal atrial fibrillation (HCC)  Pure hypercholesterolemia  COPD exacerbation (HCC) First, the patient is still smoking.  He has significant microvascular ischemia in both feet.  I explained to him that he is at high risk for losing his toes and feet in the future.  Strongly recommended smoking cessation.  Second, the patient is wheezing profusely on exam.  He states that he has had a cold for the last week so the cough his wife.  I believe that the patient has underlying COPD and this represents a COPD exacerbation.  He denies any mucopurulent sputum.  Patient presently an albuterol  2 puffs every 4-6 hours as needed for wheezing.  Recommended a CT scan of the lungs to screen for lung cancer.  Recommended the shingles vaccine and a tetanus shot.  Patient insist that he has never had chickenpox.  He would like to check varicella titers prior to getting the shingles vaccine.  I will check a CBC a CMP and a lipid panel.  Goal LDL cholesterol is less than 55.  Screening for prostate cancer with PSA.

## 2023-11-06 ENCOUNTER — Ambulatory Visit: Payer: Self-pay | Admitting: Family Medicine

## 2023-11-06 LAB — CBC WITH DIFFERENTIAL/PLATELET
Absolute Lymphocytes: 775 {cells}/uL — ABNORMAL LOW (ref 850–3900)
Absolute Monocytes: 632 {cells}/uL (ref 200–950)
Basophils Absolute: 37 {cells}/uL (ref 0–200)
Basophils Relative: 0.6 %
Eosinophils Absolute: 174 {cells}/uL (ref 15–500)
Eosinophils Relative: 2.8 %
HCT: 42.3 % (ref 38.5–50.0)
Hemoglobin: 13 g/dL — ABNORMAL LOW (ref 13.2–17.1)
MCH: 28.4 pg (ref 27.0–33.0)
MCHC: 30.7 g/dL — ABNORMAL LOW (ref 32.0–36.0)
MCV: 92.4 fL (ref 80.0–100.0)
MPV: 11.9 fL (ref 7.5–12.5)
Monocytes Relative: 10.2 %
Neutro Abs: 4582 {cells}/uL (ref 1500–7800)
Neutrophils Relative %: 73.9 %
Platelets: 163 Thousand/uL (ref 140–400)
RBC: 4.58 Million/uL (ref 4.20–5.80)
RDW: 13.3 % (ref 11.0–15.0)
Total Lymphocyte: 12.5 %
WBC: 6.2 Thousand/uL (ref 3.8–10.8)

## 2023-11-06 LAB — COMPREHENSIVE METABOLIC PANEL WITH GFR
AG Ratio: 1.7 (calc) (ref 1.0–2.5)
ALT: 16 U/L (ref 9–46)
AST: 19 U/L (ref 10–35)
Albumin: 3.9 g/dL (ref 3.6–5.1)
Alkaline phosphatase (APISO): 117 U/L (ref 35–144)
BUN: 20 mg/dL (ref 7–25)
CO2: 33 mmol/L — ABNORMAL HIGH (ref 20–32)
Calcium: 8.8 mg/dL (ref 8.6–10.3)
Chloride: 102 mmol/L (ref 98–110)
Creat: 0.86 mg/dL (ref 0.70–1.35)
Globulin: 2.3 g/dL (ref 1.9–3.7)
Glucose, Bld: 101 mg/dL — ABNORMAL HIGH (ref 65–99)
Potassium: 4.8 mmol/L (ref 3.5–5.3)
Sodium: 142 mmol/L (ref 135–146)
Total Bilirubin: 0.6 mg/dL (ref 0.2–1.2)
Total Protein: 6.2 g/dL (ref 6.1–8.1)
eGFR: 95 mL/min/1.73m2 (ref >=60)

## 2023-11-06 LAB — LIPID PANEL
Cholesterol: 106 mg/dL (ref <200)
HDL: 40 mg/dL (ref >=40)
LDL Cholesterol (Calc): 52 mg/dL
Non-HDL Cholesterol (Calc): 66 mg/dL (ref <130)
Total CHOL/HDL Ratio: 2.7 (calc) (ref <5.0)
Triglycerides: 67 mg/dL (ref <150)

## 2023-11-06 LAB — PSA: PSA: 3.76 ng/mL (ref <=4.00)

## 2023-11-06 LAB — VARICELLA ZOSTER ANTIBODY, IGG: Varicella IgG: 9.76 {s_co_ratio}

## 2023-11-06 NOTE — Progress Notes (Signed)
 Spoke w/ pt. Wife Nina/ gave results. She state she would converse w/ him for information on if he wanted Dr. Duanne to perform the prostate exam or if they would like a referral to urology.

## 2023-11-07 ENCOUNTER — Ambulatory Visit
Admission: RE | Admit: 2023-11-07 | Discharge: 2023-11-07 | Disposition: A | Discharge status: Home / Self Care | Source: Ambulatory Visit | Attending: Family Medicine | Admitting: Family Medicine

## 2023-11-07 DIAGNOSIS — Z122 Encounter for screening for malignant neoplasm of respiratory organs: Secondary | ICD-10-CM

## 2023-11-21 ENCOUNTER — Other Ambulatory Visit: Payer: Self-pay | Admitting: Family Medicine

## 2023-11-24 NOTE — Telephone Encounter (Signed)
 Requested medications are due for refill today.  yes  Requested medications are on the active medications list.  yes  Last refill. 06/17/2022 #30 3 rf  Future visit scheduled.   Next year  Notes to clinic.  Rx last filled 06/17/2022. Mg lab is expired.    Requested Prescriptions  Pending Prescriptions Disp Refills   furosemide  (LASIX ) 40 MG tablet [Pharmacy Med Name: FUROSEMIDE  40MG  TABLETS] 30 tablet 3    Sig: TAKE 1 TABLET(40 MG) BY MOUTH DAILY     Cardiovascular:  Diuretics - Loop Failed - 11/24/2023 12:15 PM      Failed - Mg Level in normal range and within 180 days    Magnesium   Date Value Ref Range Status  03/17/2019 2.2 1.7 - 2.4 mg/dL Final    Comment:    Performed at [facility], [address] [place], [place] [address]         Passed - K in normal range and within 180 days    Potassium  Date Value Ref Range Status  11/04/2023 4.8 3.5 - 5.3 mmol/L Final         Passed - Ca in normal range and within 180 days    Calcium   Date Value Ref Range Status  11/04/2023 8.8 8.6 - 10.3 mg/dL Final   Calcium , Ion  Date Value Ref Range Status  09/15/2021 1.06 (L) 1.15 - 1.40 mmol/L Final         Passed - Na in normal range and within 180 days    Sodium  Date Value Ref Range Status  11/04/2023 142 135 - 146 mmol/L Final         Passed - Cr in normal range and within 180 days    Creat  Date Value Ref Range Status  11/04/2023 0.86 0.70 - 1.35 mg/dL Final         Passed - Cl in normal range and within 180 days    Chloride  Date Value Ref Range Status  11/04/2023 102 98 - 110 mmol/L Final         Passed - Last BP in normal range    BP Readings from Last 1 Encounters:  11/04/23 (!) 124/58         Passed - Valid encounter within last 6 months    Recent Outpatient Visits           2 weeks ago Prostate cancer screening   [place] [facility] Duanne, Butler DASEN, MD   6 months ago Cervical radiculopathy   [place] [facility] Duanne Butler DASEN, MD   10 months ago Venous stasis ulcer of right calf with fat layer exposed with varicose veins [facility])   [place] [facility] Duanne Butler DASEN, MD   11 months ago Venous stasis ulcer of other part of right lower leg limited to breakdown of skin with varicose veins [facility])   [facility] Duanne Butler DASEN, MD   12 months ago Venous stasis ulcer of other part of right lower leg limited to breakdown of skin with varicose veins [facility])   [facility] Pickard, Butler DASEN, MD

## 2023-12-05 ENCOUNTER — Encounter: Payer: Self-pay | Admitting: Family Medicine

## 2023-12-05 ENCOUNTER — Ambulatory Visit: Admitting: Family Medicine

## 2023-12-05 VITALS — BP 132/64 | HR 79 | Ht 68.0 in | Wt 202.0 lb

## 2023-12-05 DIAGNOSIS — J449 Chronic obstructive pulmonary disease, unspecified: Secondary | ICD-10-CM | POA: Diagnosis not present

## 2023-12-05 NOTE — Progress Notes (Signed)
 Subjective:    Patient ID: [name], male    DOB: 11-16-1955, 68 y.o.   MRN: [id number]  HPI Patient is a very pleasant 68 year old Caucasian male who [facility] and sotalol load.  Patient has a history of atrial fibrillation status post maze procedure.  Recently was found to be back in atrial fibrillation.  Cardiology admitted the patient for DC cardioversion.  Today he is in normal sinus rhythm.  On exam he has diffuse expiratory wheezing that is chronic.  Labs in the hospital significant for mild anemia with a hemoglobin of 13, mild thrombocytopenia with a platelet count of 110, and what I believed could be metabolic compensation for respiratory acidosis with a mild elevation in his bicarbonate 34.  Past Medical History:  Diagnosis Date   Anginal pain (HCC)    Cancer (HCC)    skin   COPD (chronic obstructive pulmonary disease) (HCC)    Coronary artery disease    Dyspnea    Dysrhythmia    afib, s/p LAA ligation/MAZE 2013; recurrent aflutter 04/2021   History of hiatal hernia    medication related in 20s   Hx of artificial heart valve replacement    Hypertension    Mitral insufficiency and aortic stenosis    mechanical AVR/MVR 2013 in [place]   Myocardial infarction (HCC)    Pneumonia    as a child   PVD (peripheral vascular disease) (HCC)    Rheumatic heart disease    Stroke [facility])    Past Surgical History:  Procedure Laterality Date   ABDOMINAL AORTOGRAM W/LOWER EXTREMITY Bilateral 04/16/2021   Procedure: ABDOMINAL AORTOGRAM W/LOWER EXTREMITY;  Surgeon: Sheree Penne Bruckner, MD;  Location: [facility] INVASIVE CV LAB;  Service: Cardiovascular;  Laterality: Bilateral;   AORTIC AND MITRAL VALVE REPLACEMENT  2013   mechanical AVR/MVR in [place]   APPENDECTOMY     APPLICATION OF WOUND VAC Right 09/15/2021   Procedure: APPLICATION OF WOUND VAC RIGTH LEG;  Surgeon: Magda Debby SAILOR, MD;  Location: [facility] OR;  Service: Vascular;  Laterality:  Right;   APPLICATION OF WOUND VAC  10/05/2021   Procedure: APPLICATION OF RIGHT LEG WOUND VAC;  Surgeon: Sheree Penne Bruckner, MD;  Location: [facility] OR;  Service: Vascular;;   BIOPSY  04/20/2020   Procedure: BIOPSY;  Surgeon: Saintclair Jasper, MD;  [facility];  Service: Gastroenterology;;   CARDIAC VALVE REPLACEMENT     COLONOSCOPY WITH PROPOFOL  N/A 04/20/2020   Procedure: COLONOSCOPY WITH PROPOFOL ;  Surgeon: Saintclair Jasper, MD;  [facility];  Service: Gastroenterology;  Laterality: N/A;   CORONARY ANGIOPLASTY WITH STENT PLACEMENT  10/2017   FEMORAL ARTERY EXPLORATION Right 09/15/2021   Procedure: FEMORAL ARTERY RE EXPLORATION;  Surgeon: Magda Debby SAILOR, MD;  Location: [facility] OR;  Service: Vascular;  Laterality: Right;   FEMORAL-POPLITEAL BYPASS GRAFT Right 09/14/2021   Procedure: RIGHT COMMON FEMORAL-ABOVE KNEE POPLITEAL ARTERY BYPASS WITH VEIN;  Surgeon: Sheree Penne Bruckner, MD;  Location: [facility] OR;  Service: Vascular;  Laterality: Right;   INCISION AND DRAINAGE OF WOUND Right 10/05/2021   Procedure: RIGHT QUILLIAN BUSH;  Surgeon: Sheree Penne Bruckner, MD;  Location: [facility] OR;  Service: Vascular;  Laterality: Right;   LAPAROSCOPIC APPENDECTOMY N/A 05/17/2019   Procedure: INTERVAL LAPAROSCOPIC APPENDECTOMY;  Surgeon: Tanda Locus, MD;  [facility]S;  Service: General;  Laterality: N/A;   LOWER EXTREMITY ANGIOGRAM Right 09/15/2021   Procedure: RIGHT LOWER EXTREMITY ANGIOGRAM;  Surgeon: Magda Debby SAILOR, MD;  Location: [facility]  OR;  Service: Vascular;  Laterality: Right;   MAZE  2013   atrial appendage ligation and maze procedure in 2013   POLYPECTOMY  04/20/2020   Procedure: POLYPECTOMY;  Surgeon: Saintclair Jasper, MD;  [facility];  Service: Gastroenterology;;   Current Outpatient Medications on File Prior to Visit  Medication Sig Dispense Refill   albuterol  (VENTOLIN  HFA) 108 (90 Base) MCG/ACT inhaler Inhale 2 puffs into the lungs every 6 (six) hours as needed for wheezing  or shortness of breath. 8 g 0   aspirin  EC 81 MG tablet Take 81 mg by mouth daily. Swallow whole.     atorvastatin  (LIPITOR ) 80 MG tablet Take 80 mg by mouth every evening.      enalapril  (VASOTEC ) 5 MG tablet Take 5 mg by mouth daily.     furosemide  (LASIX ) 40 MG tablet TAKE 1 TABLET(40 MG) BY MOUTH DAILY 30 tablet 3   sotalol (BETAPACE) 120 MG tablet Take 60 mg by mouth 2 (two) times daily.     warfarin (COUMADIN ) 1 MG tablet Take 0.5-1 mg by mouth See admin instructions. Take 1 mg with 6 mg to equal 7 mg every Mon, Wed, and Fri  Take 0.5 mg with the 6 mg to equal 6.5 mg every Sun, Tues, Thurs, and Sat (Patient taking differently: Take 5 mg by mouth See admin instructions. Taking 5 mg on Tues, Thurs, Sat and Sun)     warfarin (COUMADIN ) 6 MG tablet Take 6 mg by mouth daily. (Patient taking differently: Take 5.5 mg by mouth daily. Taking 5.5 mg on M, W and F.)     carvedilol  (COREG ) 6.25 MG tablet Take 6.25 mg by mouth 2 (two) times daily. (Patient not taking: Reported on 12/05/2023)     cephALEXin  (KEFLEX ) 500 MG capsule Take 1 capsule (500 mg total) by mouth 2 (two) times daily. (Patient not taking: Reported on 12/05/2023) 20 capsule 0   predniSONE  (DELTASONE ) 20 MG tablet 3 tabs poqday 1-2, 2 tabs poqday 3-4, 1 tab poqday 5-6 (Patient not taking: Reported on 12/05/2023) 12 tablet 0   predniSONE  (DELTASONE ) 20 MG tablet 3 tabs poqday 1-2, 2 tabs poqday 3-4, 1 tab poqday 5-6 (Patient not taking: Reported on 12/05/2023) 12 tablet 0   silver  sulfADIAZINE  (SILVADENE ) 1 % cream Apply 1 Application topically daily. (Patient not taking: Reported on 12/05/2023) 50 g 0   No current facility-administered medications on file prior to visit.   Not on File Social History   Socioeconomic History   Marital status: Married    Spouse name: Not on file   Number of children: Not on file   Years of education: Not on file   Highest education level: Not on file  Occupational History   Not on file  Tobacco Use    Smoking status: Former    Types: Cigarettes    Passive exposure: Never   Smokeless tobacco: Never  Vaping Use   Vaping status: Never Used  Substance and Sexual Activity   Alcohol use: Yes    Comment: ocassional   Drug use: Never   Sexual activity: Not on file  Other Topics Concern   Not on file  Social History Narrative   Not on file   Social Drivers of Health   Financial Resource Strain: Not on file  Food Insecurity: Not on file  Transportation Needs: Not on file  Physical Activity: Not on file  Stress: Not on file  Social Connections: Not on file  Intimate Partner Violence: Not  on file    Review of Systems  All other systems reviewed and are negative.      Objective:   Physical Exam Constitutional:      General: He is not in acute distress.    Appearance: Normal appearance. He is normal weight. He is not ill-appearing or toxic-appearing.  Cardiovascular:     Rate and Rhythm: Normal rate and regular rhythm.     Heart sounds: Murmur heard.  Pulmonary:     Effort: Pulmonary effort is normal. No respiratory distress.     Breath sounds: No stridor. Examination of the right-upper field reveals wheezing. Examination of the left-upper field reveals wheezing. Examination of the right-middle field reveals wheezing. Examination of the left-middle field reveals wheezing. Examination of the right-lower field reveals wheezing. Examination of the left-lower field reveals wheezing. Wheezing present. No rhonchi or rales.  Abdominal:     General: Abdomen is flat. Bowel sounds are normal. There is no distension.     Palpations: Abdomen is soft.     Tenderness: There is no abdominal tenderness. There is no guarding.  Musculoskeletal:     Right lower leg: Pitting Edema present.     Left lower leg: Pitting Edema present.  Skin:    Findings: Rash present.      Neurological:     Mental Status: He is alert.         Assessment & Plan:  Chronic obstructive pulmonary disease,  unspecified COPD type (HCC) - Plan: CBC with Differential/Platelet, Comprehensive metabolic panel with GFR I believe the patient's labs in the hospital showed metabolic compensation for chronic respiratory acidosis.  Patient has underlying cardiovascular disease, atrial fibrillation, peripheral vascular disease, and nonhealing wounds on his extremities due to chronic venous stasis ulcers.  I believe that improving his ventilation and helping him to breathe would only help with some of his chronic conditions.  Therefore I recommended that he try a sample of Breztri 2 puffs inhaled twice daily.  I gave him 2 weeks worth of samples to see if this is beneficial.  If the patient notices improvement I would be glad to give him prescription for this in the future.  I strongly encouraged him to quit smoking

## 2023-12-06 LAB — COMPREHENSIVE METABOLIC PANEL WITH GFR
AG Ratio: 1.9 (calc) (ref 1.0–2.5)
ALT: 17 U/L (ref 9–46)
AST: 18 U/L (ref 10–35)
Albumin: 4.2 g/dL (ref 3.6–5.1)
Alkaline phosphatase (APISO): 121 U/L (ref 35–144)
BUN: 25 mg/dL (ref 7–25)
CO2: 36 mmol/L — ABNORMAL HIGH (ref 20–32)
Calcium: 9.3 mg/dL (ref 8.6–10.3)
Chloride: 100 mmol/L (ref 98–110)
Creat: 0.97 mg/dL (ref 0.70–1.35)
Globulin: 2.2 g/dL (ref 1.9–3.7)
Glucose, Bld: 86 mg/dL (ref 65–99)
Potassium: 4.2 mmol/L (ref 3.5–5.3)
Sodium: 143 mmol/L (ref 135–146)
Total Bilirubin: 0.6 mg/dL (ref 0.2–1.2)
Total Protein: 6.4 g/dL (ref 6.1–8.1)
eGFR: 86 mL/min/1.73m2 (ref >=60)

## 2023-12-06 LAB — CBC WITH DIFFERENTIAL/PLATELET
Absolute Lymphocytes: 806 {cells}/uL — ABNORMAL LOW (ref 850–3900)
Absolute Monocytes: 899 {cells}/uL (ref 200–950)
Basophils Absolute: 31 {cells}/uL (ref 0–200)
Basophils Relative: 0.5 %
Eosinophils Absolute: 167 {cells}/uL (ref 15–500)
Eosinophils Relative: 2.7 %
HCT: 43.4 % (ref 38.5–50.0)
Hemoglobin: 14.8 g/dL (ref 13.2–17.1)
MCH: 31.2 pg (ref 27.0–33.0)
MCHC: 34.1 g/dL (ref 32.0–36.0)
MCV: 91.6 fL (ref 80.0–100.0)
MPV: 11.2 fL (ref 7.5–12.5)
Monocytes Relative: 14.5 %
Neutro Abs: 4297 {cells}/uL (ref 1500–7800)
Neutrophils Relative %: 69.3 %
Platelets: 198 Thousand/uL (ref 140–400)
RBC: 4.74 Million/uL (ref 4.20–5.80)
RDW: 13.9 % (ref 11.0–15.0)
Total Lymphocyte: 13 %
WBC: 6.2 Thousand/uL (ref 3.8–10.8)

## 2023-12-08 ENCOUNTER — Ambulatory Visit: Payer: Self-pay | Admitting: Family Medicine

## 2023-12-09 ENCOUNTER — Other Ambulatory Visit (HOSPITAL_COMMUNITY): Payer: Self-pay

## 2024-01-18 IMAGING — CT CT CHEST LUNG CANCER SCREENING LOW DOSE W/O CM
2 of 5 series · 15 of 40 positions shown, 18 images · non-contrast
Comparison: None.

CLINICAL DATA: Current smoker with 45 pack-year history



[Series 4: lung 1.00 br44 cor · coronal · 0.59mm/px · 3 of 337 slices shown]
[im 68/337  lung]
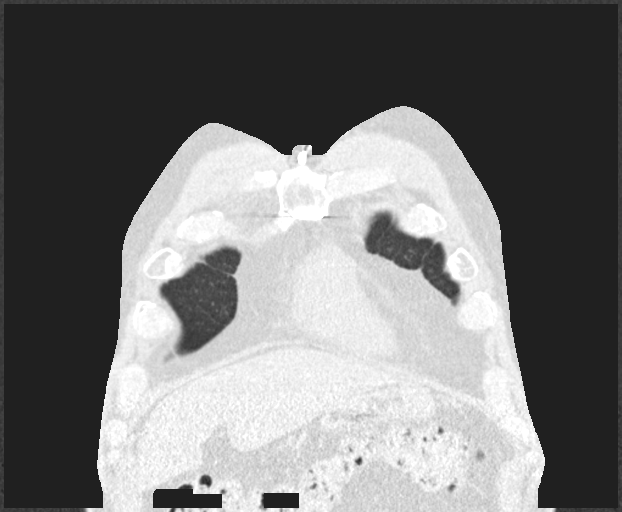
[im 135/337  lung]
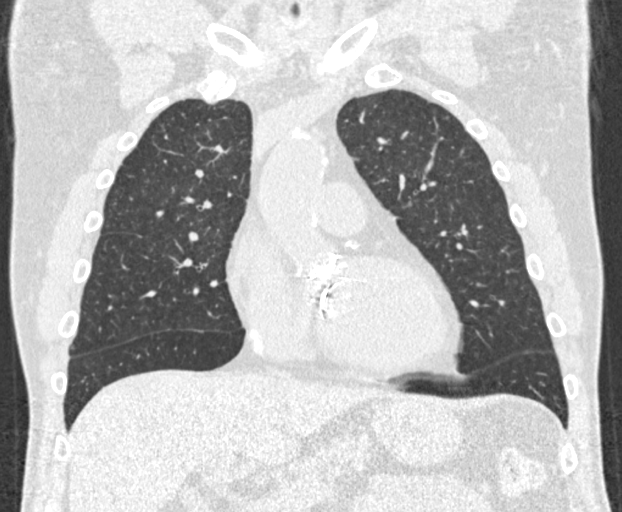
[im 202/337  lung]
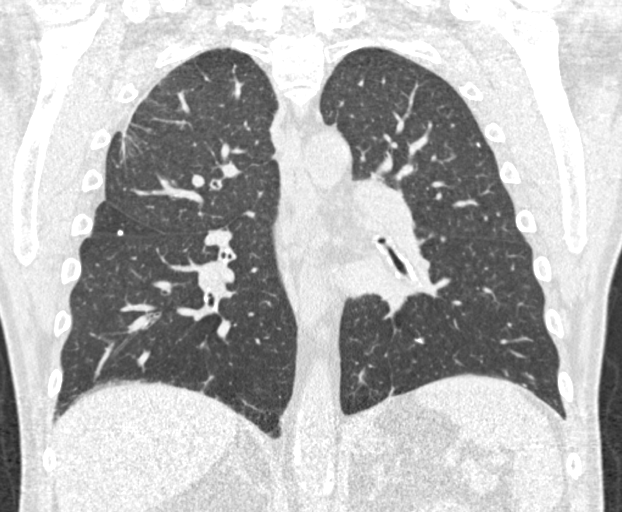

[Series 9: lung 1.00 br60 axial · axial · 0.72mm/px · z∈[-1164,-888]mm · 12 of 304 slices shown, 15 images]
[im 14/304  mediastinal]
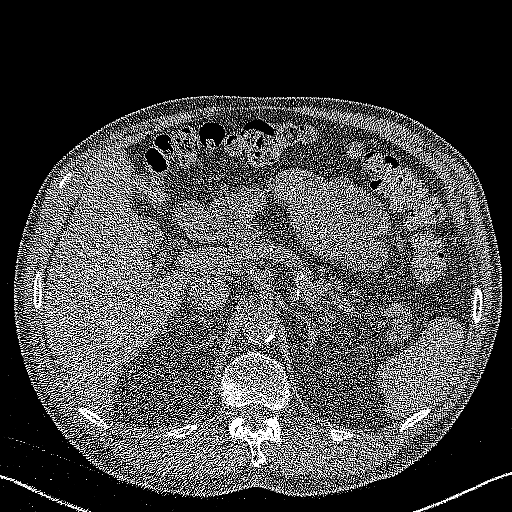
[im 14/304  lung]
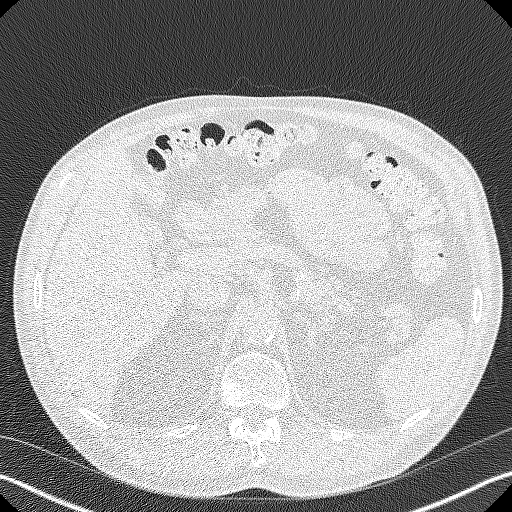
[im 42/304  lung]
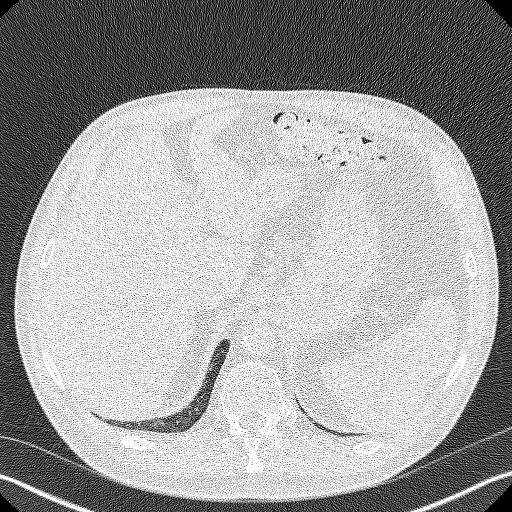
[im 69/304  lung]
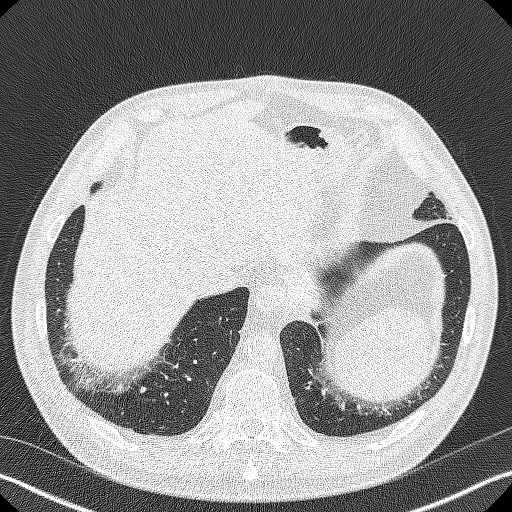
[im 97/304  lung]
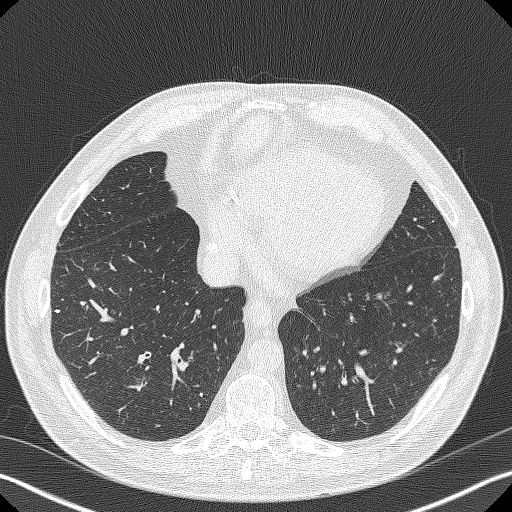
[im 111/304  mediastinal]
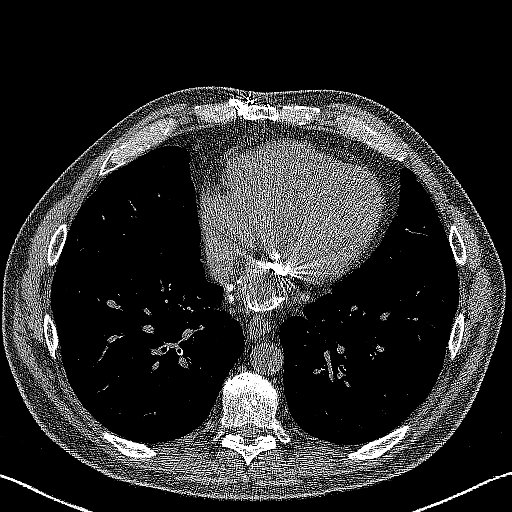
[im 111/304  lung]
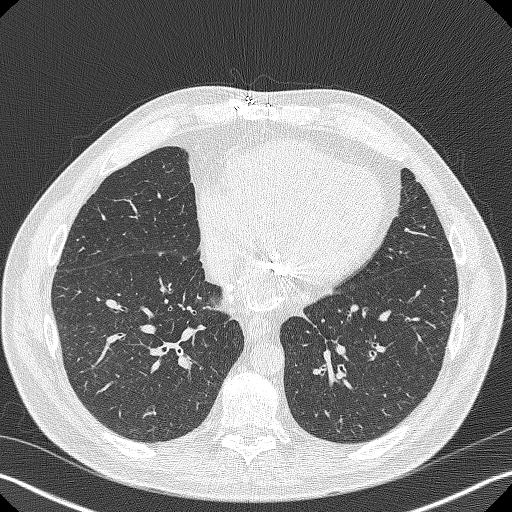
[im 138/304  lung]
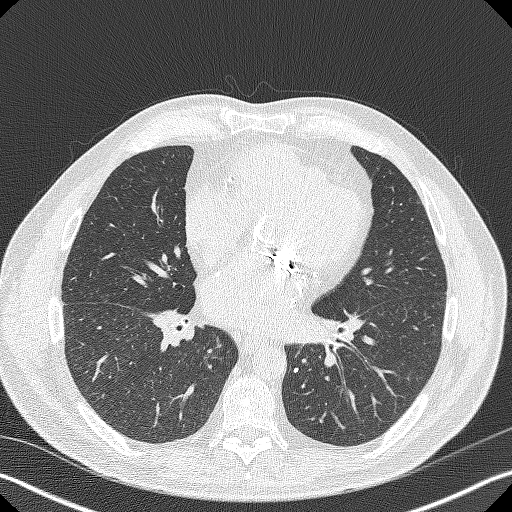
[im 166/304  lung]
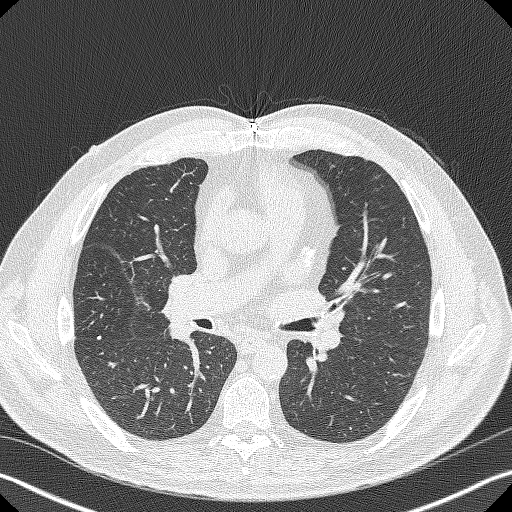
[im 193/304  lung]
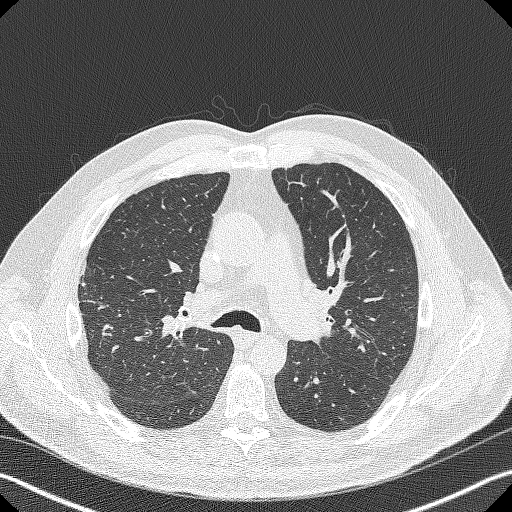
[im 207/304  mediastinal]
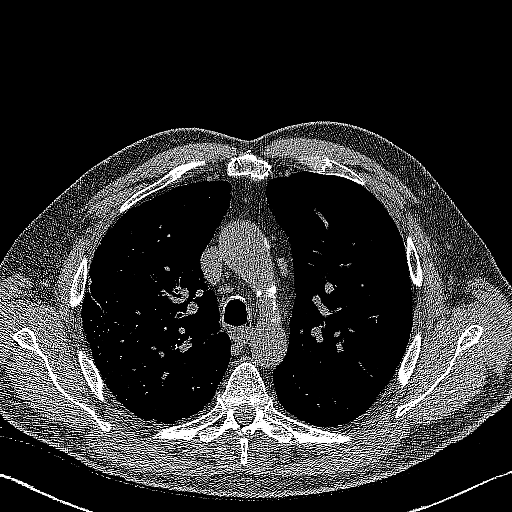
[im 207/304  lung]
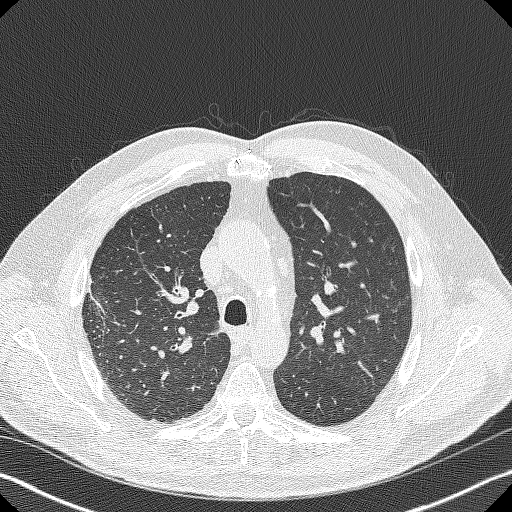
[im 235/304  lung]
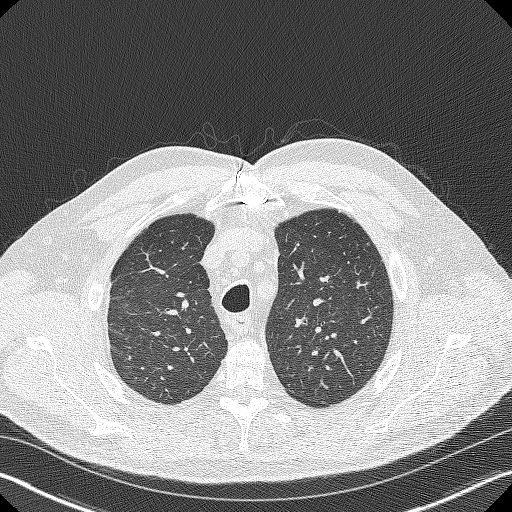
[im 262/304  lung]
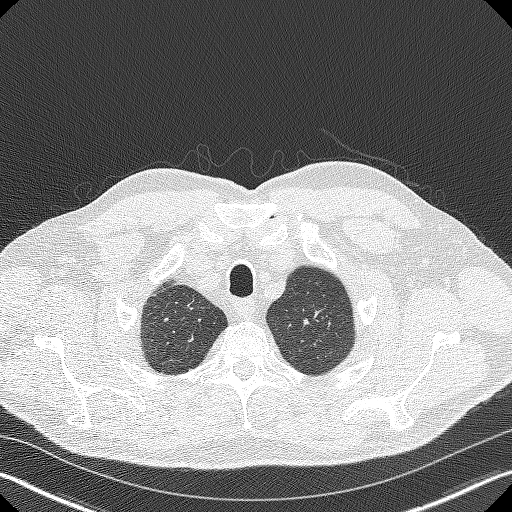
[im 290/304  lung]
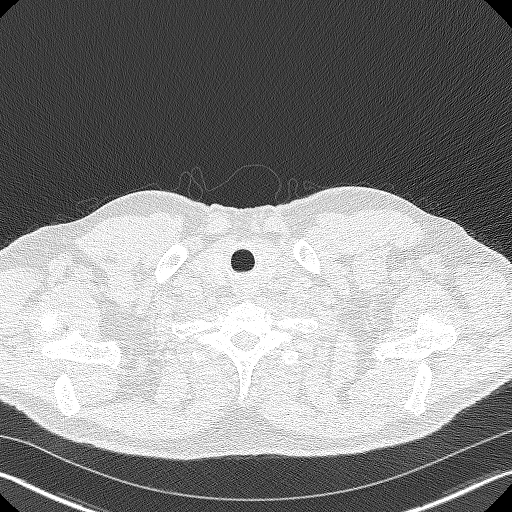

[15 of 40 positions shown; findings below may reference images not displayed]

FINDINGS: Cardiovascular: Cardiomegaly. Three-vessel coronary artery
calcifications, RCA stent. Prior aortic and mitral valve
replacement. Calcifications of the wall the left atrium.

Mediastinum/Nodes: Esophagus and thyroid are unremarkable. No
pathologically enlarged lymph nodes seen in the chest.

Lungs/Pleura: Central airways are patent. Bilateral bronchial wall
thickening, which can be seen in the setting of COPD related
bronchitis. No consolidation, pleural effusion or pneumothorax.
Numerous bilateral calcified pulmonary nodules, likely sequela of
prior granulomatous infection. Small nodule of the right middle lobe
measuring 2.0 mm on image 192 which is not definitely calcified.
Small ground-glass nodules. Largest is located in the left upper
lobe and measures 7.5 mm on image 55.

Upper Abdomen: No acute abnormality.

Musculoskeletal: Prior median sternotomy with intact sternal wires.
No aggressive appearing osseous lesions.
IMPRESSION: 1. Lung-RADS 2, benign appearance or behavior. Continue annual
screening with low-dose chest CT without contrast in 12 months. S
modifier for coronary artery calcifications.
2. Aortic Atherosclerosis ([id number]).

## 2024-01-19 ENCOUNTER — Other Ambulatory Visit: Payer: Self-pay | Admitting: Family Medicine

## 2024-03-23 ENCOUNTER — Ambulatory Visit: Admitting: Family Medicine

## 2024-03-23 ENCOUNTER — Ambulatory Visit: Payer: Self-pay

## 2024-03-23 VITALS — BP 102/56 | HR 80 | Temp 98.4°F | Ht 68.0 in | Wt 207.8 lb

## 2024-03-23 DIAGNOSIS — R300 Dysuria: Secondary | ICD-10-CM | POA: Diagnosis not present

## 2024-03-23 LAB — URINALYSIS, ROUTINE W REFLEX MICROSCOPIC
Bilirubin Urine: NEGATIVE
Casts: NONE SEEN /LPF
Crystals: NONE SEEN " /HPF"
Glucose, UA: NEGATIVE
Ketones, ur: NEGATIVE
Nitrite: POSITIVE — AB
Specific Gravity, Urine: 1.025 (ref 1.001–1.035)
Yeast: NONE SEEN /HPF
pH: 5.5 (ref 5.0–8.0)

## 2024-03-23 LAB — MICROSCOPIC MESSAGE

## 2024-03-23 MED ORDER — CIPROFLOXACIN HCL 500 MG PO TABS: 500.0000 mg | ORAL_TABLET | Freq: Two times a day (BID) | ORAL | 0 refills | Status: AC

## 2024-03-23 NOTE — Progress Notes (Signed)
 "  Subjective:    Patient ID: [name], male    DOB: Sep 02, 1955, 68 y.o.   MRN: [id number]  HPI Patient is a very pleasant 68 year old Caucasian male who fell on Tuesday.  He landed awkwardly on his left side.  He felt a cracking sensation in his left ribs.  He denies any chest pain.  He denies any shortness of breath beyond his baseline he is wheezing today on exam.  He denies any pleurisy.  He denies any cough.  He does report dysuria and frequency and urgency and hesitancy.  This is. Past Medical History:  Diagnosis Date   Anginal pain    Cancer (HCC)    skin   COPD (chronic obstructive pulmonary disease) (HCC)    Coronary artery disease    Dyspnea    Dysrhythmia    afib, s/p LAA ligation/MAZE 2013; recurrent aflutter 04/2021   History of hiatal hernia    medication related in 20s   Hx of artificial heart valve replacement    Hypertension    Mitral insufficiency and aortic stenosis    mechanical AVR/MVR 2013 in [place]   Myocardial infarction (HCC)    Pneumonia    as a child   PVD (peripheral vascular disease)    Rheumatic heart disease    Stroke [facility])    Past Surgical History:  Procedure Laterality Date   ABDOMINAL AORTOGRAM W/LOWER EXTREMITY Bilateral 04/16/2021   Procedure: ABDOMINAL AORTOGRAM W/LOWER EXTREMITY;  Surgeon: Sheree Penne Bruckner, MD;  Location: [facility] INVASIVE CV LAB;  Service: Cardiovascular;  Laterality: Bilateral;   AORTIC AND MITRAL VALVE REPLACEMENT  2013   mechanical AVR/MVR in [place]   APPENDECTOMY     APPLICATION OF WOUND VAC Right 09/15/2021   Procedure: APPLICATION OF WOUND VAC RIGTH LEG;  Surgeon: Magda Debby SAILOR, MD;  Location: [facility] OR;  Service: Vascular;  Laterality: Right;   APPLICATION OF WOUND VAC  10/05/2021   Procedure: APPLICATION OF RIGHT LEG WOUND VAC;  Surgeon: Sheree Penne Bruckner, MD;  Location: [facility] OR;  Service: Vascular;;   BIOPSY  04/20/2020   Procedure: BIOPSY;  Surgeon: Saintclair Jasper, MD;  Location: [facility] ENDOSCOPY;  Service:  Gastroenterology;;   CARDIAC VALVE REPLACEMENT     COLONOSCOPY WITH PROPOFOL  N/A 04/20/2020   Procedure: COLONOSCOPY WITH PROPOFOL ;  Surgeon: Saintclair Jasper, MD;  Location: [facility];  Service: Gastroenterology;  Laterality: N/A;   CORONARY ANGIOPLASTY WITH STENT PLACEMENT  10/2017   FEMORAL ARTERY EXPLORATION Right 09/15/2021   Procedure: FEMORAL ARTERY RE EXPLORATION;  Surgeon: Magda Debby SAILOR, MD;  Location: [facility] OR;  Service: Vascular;  Laterality: Right;   FEMORAL-POPLITEAL BYPASS GRAFT Right 09/14/2021   Procedure: RIGHT COMMON FEMORAL-ABOVE KNEE POPLITEAL ARTERY BYPASS WITH VEIN;  Surgeon: Sheree Penne Bruckner, MD;  Location: [facility] OR;  Service: Vascular;  Laterality: Right;   INCISION AND DRAINAGE OF WOUND Right 10/05/2021   Procedure: RIGHT QUILLIAN BUSH;  Surgeon: Sheree Penne Bruckner, MD;  Location: [facility] OR;  Service: Vascular;  Laterality: Right;   LAPAROSCOPIC APPENDECTOMY N/A 05/17/2019   Procedure: INTERVAL LAPAROSCOPIC APPENDECTOMY;  Surgeon: Tanda Locus, MD;  Location: [facility] ORS;  Service: General;  Laterality: N/A;   LOWER EXTREMITY ANGIOGRAM Right 09/15/2021   Procedure: RIGHT LOWER EXTREMITY ANGIOGRAM;  Surgeon: Magda Debby SAILOR, MD;  Location: [facility] OR;  Service: Vascular;  Laterality: Right;   MAZE  2013   atrial appendage ligation and maze procedure in 2013   POLYPECTOMY  04/20/2020   Procedure: POLYPECTOMY;  Surgeon: Saintclair Jasper, MD;  Location: [facility]OS[facility];  Service: Gastroenterology;;   Current Outpatient Medications on File Prior to Visit  Medication Sig Dispense Refill   albuterol  (VENTOLIN  HFA) 108 (90 Base) MCG/ACT inhaler Inhale 2 puffs into the lungs every 6 (six) hours as needed for wheezing or shortness of breath. 8 g 0   aspirin  EC 81 MG tablet Take 81 mg by mouth daily. Swallow whole.     atorvastatin  (LIPITOR ) 80 MG tablet Take 80 mg by mouth every evening.      carvedilol  (COREG ) 6.25 MG tablet Take 6.25 mg by mouth 2 (two) times daily. (Patient not taking:  Reported on 12/05/2023)     cephALEXin  (KEFLEX ) 500 MG capsule Take 1 capsule (500 mg total) by mouth 2 (two) times daily. (Patient not taking: Reported on 12/05/2023) 20 capsule 0   enalapril  (VASOTEC ) 5 MG tablet Take 5 mg by mouth daily.     furosemide  (LASIX ) 40 MG tablet TAKE 1 TABLET(40 MG) BY MOUTH DAILY 30 tablet 3   predniSONE  (DELTASONE ) 20 MG tablet 3 tabs poqday 1-2, 2 tabs poqday 3-4, 1 tab poqday 5-6 (Patient not taking: Reported on 12/05/2023) 12 tablet 0   predniSONE  (DELTASONE ) 20 MG tablet 3 tabs poqday 1-2, 2 tabs poqday 3-4, 1 tab poqday 5-6 (Patient not taking: Reported on 12/05/2023) 12 tablet 0   SSD 1 % cream APPLY TOPICALLY TO THE AFFECTED AREA DAILY 50 g 0   warfarin (COUMADIN ) 1 MG tablet Take 0.5-1 mg by mouth See admin instructions. Take 1 mg with 6 mg to equal 7 mg every Mon, Wed, and Fri  Take 0.5 mg with the 6 mg to equal 6.5 mg every Sun, Tues, Thurs, and Sat (Patient taking differently: Take 5 mg by mouth See admin instructions. Taking 5 mg on Tues, Thurs, Sat and Sun)     warfarin (COUMADIN ) 6 MG tablet Take 6 mg by mouth daily. (Patient taking differently: Take 5.5 mg by mouth daily. Taking 5.5 mg on M, W and F.)     No current facility-administered medications on file prior to visit.   Not on File Social History   Socioeconomic History   Marital status: Married    Spouse name: Not on file   Number of children: Not on file   Years of education: Not on file   Highest education level: Not on file  Occupational History   Not on file  Tobacco Use   Smoking status: Former    Types: Cigarettes    Passive exposure: Never   Smokeless tobacco: Never  Vaping Use   Vaping status: Never Used  Substance and Sexual Activity   Alcohol use: Yes    Comment: ocassional   Drug use: Never   Sexual activity: Not on file  Other Topics Concern   Not on file  Social History Narrative   Not on file   Social Drivers of Health   Tobacco Use: Medium Risk (01/19/2024)    Received from [facility]   Patient History    Smoking Tobacco Use: Former    Smokeless Tobacco Use: Never    Passive Exposure: Not on Actuary Strain: Not on file  Food Insecurity: Not on file  Transportation Needs: Not on file  Physical Activity: Not on file  Stress: Not on file  Social Connections: Not on file  Intimate Partner Violence: Not on file  Depression (PHQ2-9): Low Risk (12/05/2023)   Depression (PHQ2-9)    PHQ-2 Score: 0  Alcohol Screen: Not on  file  Housing: Not on file  Utilities: Not on file  Health Literacy: Not on file    Review of Systems  All other systems reviewed and are negative.      Objective:   Physical Exam Constitutional:      General: He is not in acute distress.    Appearance: Normal appearance. He is normal weight. He is not ill-appearing or toxic-appearing.  Cardiovascular:     Rate and Rhythm: Normal rate and regular rhythm.     Heart sounds: Murmur heard.  Pulmonary:     Effort: Pulmonary effort is normal. No respiratory distress.     Breath sounds: No stridor. Examination of the right-upper field reveals wheezing. Examination of the left-upper field reveals wheezing. Examination of the right-middle field reveals wheezing. Examination of the left-middle field reveals wheezing. Examination of the right-lower field reveals wheezing. Examination of the left-lower field reveals wheezing. Wheezing present. No rhonchi or rales.  Chest:     Chest wall: Tenderness present.    Abdominal:     General: Abdomen is flat. Bowel sounds are normal. There is no distension.     Palpations: Abdomen is soft.     Tenderness: There is no abdominal tenderness. There is no guarding.  Musculoskeletal:     Right lower leg: Pitting Edema present.     Left lower leg: Pitting Edema present.  Skin:     Neurological:     Mental Status: He is alert.         Assessment & Plan:  Dysuria - Plan: Urinalysis, Routine w reflex  microscopic Patient very well may have bruised or even cracked some ribs on the left side but there is no evidence of a pneumonia or pneumothorax.  I am concerned that he may be developing prostatitis.  Begin Cipro  500 mg twice daily for 7 days and recheck in 1 week if no better or sooner if worse "

## 2024-03-23 NOTE — Telephone Encounter (Signed)
 FYI Only or Action Required?: FYI only for provider: appointment scheduled on 03/23/2024.  Patient was last seen in primary care on 12/05/2023 by Duanne Butler DASEN, MD.  Called Nurse Triage reporting Abdominal Pain.  Symptoms began x couple days.  Interventions attempted: Nothing.  Symptoms are: gradually worsening.  Triage Disposition: See Physician Within 24 Hours  Patient/caregiver understands and will follow disposition?: Yes    Copied from CRM #[id number]. Topic: Clinical - Red Word Triage >> Mar 23, 2024  7:44 AM Amber H wrote: [facility] that prompted transfer to Nurse Triage: Brad (patients wife) stated patient was having burning with sensation and feels like he has a cracked rib Reason for Disposition  [1] No prior tetanus shots (or is not fully vaccinated) AND [2] any wound (e.g., cut, scrape)  Answer Assessment - Initial Assessment Questions 1. MECHANISM: How did the injury happen?      Elbow went into rib area 2. ONSET: When did the injury happen? (e.g., minutes, hours ago)     X couple days 3. LOCATION: What part of the abdomen is injured?     Left rib area 4. APPEARANCE of INJURY: What does the injury look like?     na 5. PAIN: Is there any pain? If Yes, ask: How bad is the pain?  (Scale 0-10; or none, mild, moderate, severe)     2 to 6/10 6. SIZE: For cuts, bruises, or swelling, ask: How large is it? (e.g., inches or centimeters)     na 7. TETANUS: For any breaks in the skin, ask: When was your last tetanus booster?     na 8. OTHER SYMPTOMS: Do you have any other symptoms?     Pain increases with breathing 9. PREGNANCY: Is there any chance you are pregnant? When was your last menstrual period?     Na  Painful urination, bad urination odor, pt thinks he has a UTI.  Pt also had a fall a couple days ago and possible cracked rib  Protocols used: Abdominal Injury-A-AH

## 2024-04-01 ENCOUNTER — Encounter: Payer: Self-pay | Admitting: Family Medicine

## 2024-05-11 ENCOUNTER — Encounter (HOSPITAL_BASED_OUTPATIENT_CLINIC_OR_DEPARTMENT_OTHER): Admitting: General Surgery

## 2024-11-04 ENCOUNTER — Encounter: Admitting: Family Medicine
